# Patient Record
Sex: Female | Born: 1945 | Race: White | Hispanic: No | Smoking: Never smoker
Health system: Southern US, Community
[De-identification: ages and names within clinical notes are randomized; demographics above are authoritative.]

## PROBLEM LIST (undated history)

## (undated) DIAGNOSIS — Z87448 Personal history of other diseases of urinary system: Secondary | ICD-10-CM

## (undated) DIAGNOSIS — N63 Unspecified lump in unspecified breast: Secondary | ICD-10-CM

## (undated) DIAGNOSIS — F419 Anxiety disorder, unspecified: Secondary | ICD-10-CM

## (undated) DIAGNOSIS — M1990 Unspecified osteoarthritis, unspecified site: Secondary | ICD-10-CM

## (undated) DIAGNOSIS — R7303 Prediabetes: Secondary | ICD-10-CM

## (undated) DIAGNOSIS — J4530 Mild persistent asthma, uncomplicated: Secondary | ICD-10-CM

## (undated) DIAGNOSIS — F329 Major depressive disorder, single episode, unspecified: Secondary | ICD-10-CM

## (undated) DIAGNOSIS — R112 Nausea with vomiting, unspecified: Secondary | ICD-10-CM

## (undated) DIAGNOSIS — Z9889 Other specified postprocedural states: Secondary | ICD-10-CM

## (undated) DIAGNOSIS — H1045 Other chronic allergic conjunctivitis: Secondary | ICD-10-CM

## (undated) DIAGNOSIS — N3281 Overactive bladder: Secondary | ICD-10-CM

## (undated) DIAGNOSIS — K8020 Calculus of gallbladder without cholecystitis without obstruction: Secondary | ICD-10-CM

## (undated) DIAGNOSIS — J45909 Unspecified asthma, uncomplicated: Secondary | ICD-10-CM

## (undated) DIAGNOSIS — H8110 Benign paroxysmal vertigo, unspecified ear: Secondary | ICD-10-CM

## (undated) DIAGNOSIS — K829 Disease of gallbladder, unspecified: Secondary | ICD-10-CM

## (undated) DIAGNOSIS — Z973 Presence of spectacles and contact lenses: Secondary | ICD-10-CM

## (undated) DIAGNOSIS — D649 Anemia, unspecified: Secondary | ICD-10-CM

## (undated) DIAGNOSIS — M797 Fibromyalgia: Secondary | ICD-10-CM

## (undated) DIAGNOSIS — E538 Deficiency of other specified B group vitamins: Secondary | ICD-10-CM

## (undated) DIAGNOSIS — K5790 Diverticulosis of intestine, part unspecified, without perforation or abscess without bleeding: Secondary | ICD-10-CM

## (undated) DIAGNOSIS — E559 Vitamin D deficiency, unspecified: Secondary | ICD-10-CM

## (undated) DIAGNOSIS — M549 Dorsalgia, unspecified: Secondary | ICD-10-CM

## (undated) DIAGNOSIS — R1310 Dysphagia, unspecified: Secondary | ICD-10-CM

## (undated) DIAGNOSIS — K219 Gastro-esophageal reflux disease without esophagitis: Secondary | ICD-10-CM

## (undated) DIAGNOSIS — M2550 Pain in unspecified joint: Secondary | ICD-10-CM

## (undated) DIAGNOSIS — K862 Cyst of pancreas: Secondary | ICD-10-CM

## (undated) DIAGNOSIS — N393 Stress incontinence (female) (male): Secondary | ICD-10-CM

## (undated) DIAGNOSIS — K449 Diaphragmatic hernia without obstruction or gangrene: Secondary | ICD-10-CM

## (undated) DIAGNOSIS — G2581 Restless legs syndrome: Secondary | ICD-10-CM

## (undated) DIAGNOSIS — J3089 Other allergic rhinitis: Secondary | ICD-10-CM

## (undated) DIAGNOSIS — D509 Iron deficiency anemia, unspecified: Secondary | ICD-10-CM

## (undated) DIAGNOSIS — J302 Other seasonal allergic rhinitis: Secondary | ICD-10-CM

## (undated) DIAGNOSIS — G4733 Obstructive sleep apnea (adult) (pediatric): Secondary | ICD-10-CM

## (undated) DIAGNOSIS — R197 Diarrhea, unspecified: Secondary | ICD-10-CM

## (undated) DIAGNOSIS — F32A Depression, unspecified: Secondary | ICD-10-CM

## (undated) HISTORY — DX: Unspecified lump in unspecified breast: N63.0

## (undated) HISTORY — DX: Vitamin D deficiency, unspecified: E55.9

## (undated) HISTORY — DX: Deficiency of other specified B group vitamins: E53.8

## (undated) HISTORY — DX: Cyst of pancreas: K86.2

## (undated) HISTORY — DX: Obstructive sleep apnea (adult) (pediatric): G47.33

## (undated) HISTORY — PX: CORNEA LESION EXCISION: SHX356

## (undated) HISTORY — PX: BREAST REDUCTION SURGERY: SHX8

## (undated) HISTORY — DX: Anxiety disorder, unspecified: F41.9

## (undated) HISTORY — DX: Dysphagia, unspecified: R13.10

## (undated) HISTORY — DX: Fibromyalgia: M79.7

## (undated) HISTORY — DX: Pain in unspecified joint: M25.50

## (undated) HISTORY — DX: Diverticulosis of intestine, part unspecified, without perforation or abscess without bleeding: K57.90

## (undated) HISTORY — DX: Major depressive disorder, single episode, unspecified: F32.9

## (undated) HISTORY — DX: Restless legs syndrome: G25.81

## (undated) HISTORY — DX: Calculus of gallbladder without cholecystitis without obstruction: K80.20

## (undated) HISTORY — DX: Gastro-esophageal reflux disease without esophagitis: K21.9

## (undated) HISTORY — DX: Dorsalgia, unspecified: M54.9

## (undated) HISTORY — DX: Unspecified asthma, uncomplicated: J45.909

## (undated) HISTORY — PX: BLADDER SUSPENSION: SHX72

## (undated) HISTORY — DX: Disease of gallbladder, unspecified: K82.9

## (undated) HISTORY — DX: Depression, unspecified: F32.A

---

## 1982-05-31 HISTORY — PX: VAGINAL HYSTERECTOMY: SHX2639

## 1983-06-01 HISTORY — PX: BREAST LUMPECTOMY: SHX2

## 1999-05-19 ENCOUNTER — Other Ambulatory Visit: Admission: RE | Admit: 1999-05-19 | Discharge: 1999-05-19 | Payer: Self-pay | Admitting: *Deleted

## 2000-03-18 ENCOUNTER — Encounter: Payer: Self-pay | Admitting: Orthopedic Surgery

## 2000-03-18 ENCOUNTER — Encounter: Admission: RE | Admit: 2000-03-18 | Discharge: 2000-03-18 | Payer: Self-pay | Admitting: Orthopedic Surgery

## 2000-05-16 ENCOUNTER — Other Ambulatory Visit: Admission: RE | Admit: 2000-05-16 | Discharge: 2000-05-16 | Payer: Self-pay | Admitting: *Deleted

## 2000-11-28 ENCOUNTER — Other Ambulatory Visit: Admission: RE | Admit: 2000-11-28 | Discharge: 2000-11-28 | Payer: Self-pay | Admitting: Plastic Surgery

## 2000-11-28 ENCOUNTER — Encounter (INDEPENDENT_AMBULATORY_CARE_PROVIDER_SITE_OTHER): Payer: Self-pay

## 2001-05-17 ENCOUNTER — Other Ambulatory Visit: Admission: RE | Admit: 2001-05-17 | Discharge: 2001-05-17 | Payer: Self-pay | Admitting: *Deleted

## 2002-05-21 ENCOUNTER — Other Ambulatory Visit: Admission: RE | Admit: 2002-05-21 | Discharge: 2002-05-21 | Payer: Self-pay | Admitting: *Deleted

## 2003-05-22 ENCOUNTER — Other Ambulatory Visit: Admission: RE | Admit: 2003-05-22 | Discharge: 2003-05-22 | Payer: Self-pay | Admitting: *Deleted

## 2004-05-06 ENCOUNTER — Other Ambulatory Visit: Admission: RE | Admit: 2004-05-06 | Discharge: 2004-05-06 | Payer: Self-pay | Admitting: *Deleted

## 2004-05-28 ENCOUNTER — Ambulatory Visit: Payer: Self-pay | Admitting: Internal Medicine

## 2004-09-25 ENCOUNTER — Encounter: Admission: RE | Admit: 2004-09-25 | Discharge: 2004-09-25 | Payer: Self-pay | Admitting: Rheumatology

## 2005-02-16 ENCOUNTER — Ambulatory Visit: Payer: Self-pay | Admitting: Internal Medicine

## 2005-03-11 ENCOUNTER — Ambulatory Visit: Payer: Self-pay | Admitting: Internal Medicine

## 2005-03-25 ENCOUNTER — Ambulatory Visit: Payer: Self-pay | Admitting: Internal Medicine

## 2005-04-30 ENCOUNTER — Ambulatory Visit: Payer: Self-pay | Admitting: Internal Medicine

## 2005-07-21 ENCOUNTER — Other Ambulatory Visit: Admission: RE | Admit: 2005-07-21 | Discharge: 2005-07-21 | Payer: Self-pay | Admitting: *Deleted

## 2005-08-12 ENCOUNTER — Ambulatory Visit: Payer: Self-pay | Admitting: Internal Medicine

## 2006-01-03 ENCOUNTER — Ambulatory Visit: Payer: Self-pay | Admitting: Internal Medicine

## 2006-01-24 ENCOUNTER — Ambulatory Visit: Payer: Self-pay | Admitting: Internal Medicine

## 2006-05-25 ENCOUNTER — Encounter: Admission: RE | Admit: 2006-05-25 | Discharge: 2006-05-25 | Payer: Self-pay | Admitting: Rheumatology

## 2006-09-27 ENCOUNTER — Other Ambulatory Visit: Admission: RE | Admit: 2006-09-27 | Discharge: 2006-09-27 | Payer: Self-pay | Admitting: *Deleted

## 2006-12-08 ENCOUNTER — Encounter (INDEPENDENT_AMBULATORY_CARE_PROVIDER_SITE_OTHER): Payer: Self-pay | Admitting: *Deleted

## 2007-01-06 ENCOUNTER — Ambulatory Visit: Payer: Self-pay | Admitting: Internal Medicine

## 2007-01-06 DIAGNOSIS — E8881 Metabolic syndrome: Secondary | ICD-10-CM | POA: Insufficient documentation

## 2007-01-06 DIAGNOSIS — K589 Irritable bowel syndrome without diarrhea: Secondary | ICD-10-CM | POA: Insufficient documentation

## 2007-01-06 DIAGNOSIS — IMO0001 Reserved for inherently not codable concepts without codable children: Secondary | ICD-10-CM | POA: Insufficient documentation

## 2007-01-06 DIAGNOSIS — J309 Allergic rhinitis, unspecified: Secondary | ICD-10-CM | POA: Insufficient documentation

## 2007-01-06 DIAGNOSIS — M25559 Pain in unspecified hip: Secondary | ICD-10-CM | POA: Insufficient documentation

## 2007-01-06 DIAGNOSIS — F411 Generalized anxiety disorder: Secondary | ICD-10-CM | POA: Insufficient documentation

## 2007-01-06 DIAGNOSIS — I10 Essential (primary) hypertension: Secondary | ICD-10-CM | POA: Insufficient documentation

## 2007-01-16 ENCOUNTER — Encounter (INDEPENDENT_AMBULATORY_CARE_PROVIDER_SITE_OTHER): Payer: Self-pay | Admitting: *Deleted

## 2007-02-27 ENCOUNTER — Encounter: Payer: Self-pay | Admitting: Internal Medicine

## 2007-04-17 ENCOUNTER — Encounter: Payer: Self-pay | Admitting: Internal Medicine

## 2007-04-19 ENCOUNTER — Encounter: Payer: Self-pay | Admitting: Internal Medicine

## 2007-09-28 ENCOUNTER — Encounter: Payer: Self-pay | Admitting: Internal Medicine

## 2007-10-11 ENCOUNTER — Encounter: Admission: RE | Admit: 2007-10-11 | Discharge: 2007-10-11 | Payer: Self-pay | Admitting: Rheumatology

## 2007-10-25 ENCOUNTER — Other Ambulatory Visit: Admission: RE | Admit: 2007-10-25 | Discharge: 2007-10-25 | Payer: Self-pay | Admitting: Gynecology

## 2007-11-03 ENCOUNTER — Encounter: Payer: Self-pay | Admitting: Internal Medicine

## 2008-01-16 ENCOUNTER — Ambulatory Visit: Payer: Self-pay | Admitting: Internal Medicine

## 2008-01-16 DIAGNOSIS — K5730 Diverticulosis of large intestine without perforation or abscess without bleeding: Secondary | ICD-10-CM | POA: Insufficient documentation

## 2008-01-16 DIAGNOSIS — R1310 Dysphagia, unspecified: Secondary | ICD-10-CM | POA: Insufficient documentation

## 2008-01-19 ENCOUNTER — Encounter: Payer: Self-pay | Admitting: Internal Medicine

## 2008-01-22 ENCOUNTER — Encounter (INDEPENDENT_AMBULATORY_CARE_PROVIDER_SITE_OTHER): Payer: Self-pay | Admitting: *Deleted

## 2008-01-22 LAB — CONVERTED CEMR LAB
ALT: 10 units/L (ref 0–35)
Albumin: 3.8 g/dL (ref 3.5–5.2)
BUN: 12 mg/dL (ref 6–23)
Basophils Relative: 0.7 % (ref 0.0–3.0)
Eosinophils Relative: 2.2 % (ref 0.0–5.0)
HCT: 37.5 % (ref 36.0–46.0)
Hemoglobin: 12.5 g/dL (ref 12.0–15.0)
Monocytes Absolute: 0.2 10*3/uL (ref 0.1–1.0)
Monocytes Relative: 3.8 % (ref 3.0–12.0)
Neutro Abs: 4.2 10*3/uL (ref 1.4–7.7)
Potassium: 3.9 meq/L (ref 3.5–5.1)
RBC: 4.26 M/uL (ref 3.87–5.11)
RDW: 13.4 % (ref 11.5–14.6)
Total Bilirubin: 0.5 mg/dL (ref 0.3–1.2)
Total Protein: 7 g/dL (ref 6.0–8.3)

## 2008-01-24 ENCOUNTER — Encounter (INDEPENDENT_AMBULATORY_CARE_PROVIDER_SITE_OTHER): Payer: Self-pay | Admitting: *Deleted

## 2008-03-01 ENCOUNTER — Telehealth: Payer: Self-pay | Admitting: Internal Medicine

## 2008-03-06 ENCOUNTER — Encounter: Payer: Self-pay | Admitting: Internal Medicine

## 2008-03-13 ENCOUNTER — Ambulatory Visit: Payer: Self-pay | Admitting: Pulmonary Disease

## 2008-03-26 ENCOUNTER — Encounter: Payer: Self-pay | Admitting: Internal Medicine

## 2008-03-27 ENCOUNTER — Ambulatory Visit: Payer: Self-pay | Admitting: Internal Medicine

## 2008-03-28 ENCOUNTER — Encounter: Payer: Self-pay | Admitting: Internal Medicine

## 2008-03-28 ENCOUNTER — Ambulatory Visit (HOSPITAL_BASED_OUTPATIENT_CLINIC_OR_DEPARTMENT_OTHER): Admission: RE | Admit: 2008-03-28 | Discharge: 2008-03-28 | Payer: Self-pay | Admitting: Pulmonary Disease

## 2008-04-04 ENCOUNTER — Ambulatory Visit: Payer: Self-pay | Admitting: Pulmonary Disease

## 2008-04-23 ENCOUNTER — Ambulatory Visit: Payer: Self-pay | Admitting: Pulmonary Disease

## 2008-04-23 DIAGNOSIS — G4733 Obstructive sleep apnea (adult) (pediatric): Secondary | ICD-10-CM | POA: Insufficient documentation

## 2008-04-23 DIAGNOSIS — G2581 Restless legs syndrome: Secondary | ICD-10-CM | POA: Insufficient documentation

## 2008-04-29 ENCOUNTER — Encounter: Payer: Self-pay | Admitting: Pulmonary Disease

## 2008-05-02 ENCOUNTER — Encounter: Payer: Self-pay | Admitting: Pulmonary Disease

## 2008-05-03 ENCOUNTER — Telehealth (INDEPENDENT_AMBULATORY_CARE_PROVIDER_SITE_OTHER): Payer: Self-pay | Admitting: *Deleted

## 2008-05-03 ENCOUNTER — Telehealth: Payer: Self-pay | Admitting: Internal Medicine

## 2008-05-03 LAB — CONVERTED CEMR LAB
BUN: 9 mg/dL (ref 6–23)
Calcium: 9.3 mg/dL (ref 8.4–10.5)
Creatinine, Ser: 0.8 mg/dL (ref 0.4–1.2)
Eosinophils Absolute: 0.1 10*3/uL (ref 0.0–0.7)
Ferritin: 19.1 ng/mL (ref 10.0–291.0)
Folate: 9.7 ng/mL
GFR calc non Af Amer: 78 mL/min
HCT: 38.6 % (ref 36.0–46.0)
Hemoglobin: 13.1 g/dL (ref 12.0–15.0)
Iron: 28 ug/dL — ABNORMAL LOW (ref 42–145)
MCHC: 33.8 g/dL (ref 30.0–36.0)
MCV: 84.4 fL (ref 78.0–100.0)
Monocytes Absolute: 0.6 10*3/uL (ref 0.1–1.0)
Neutro Abs: 4.5 10*3/uL (ref 1.4–7.7)
Platelets: 269 10*3/uL (ref 150–400)
RDW: 13.1 % (ref 11.5–14.6)
TSH: 1.19 microintl units/mL (ref 0.35–5.50)
Vitamin B-12: 169 pg/mL — ABNORMAL LOW (ref 211–911)

## 2008-05-07 ENCOUNTER — Ambulatory Visit: Payer: Self-pay | Admitting: Internal Medicine

## 2008-05-07 DIAGNOSIS — K219 Gastro-esophageal reflux disease without esophagitis: Secondary | ICD-10-CM | POA: Insufficient documentation

## 2008-05-09 ENCOUNTER — Encounter (INDEPENDENT_AMBULATORY_CARE_PROVIDER_SITE_OTHER): Payer: Self-pay | Admitting: *Deleted

## 2008-05-31 HISTORY — PX: OTHER SURGICAL HISTORY: SHX169

## 2008-06-05 ENCOUNTER — Telehealth (INDEPENDENT_AMBULATORY_CARE_PROVIDER_SITE_OTHER): Payer: Self-pay | Admitting: *Deleted

## 2008-06-12 ENCOUNTER — Ambulatory Visit: Payer: Self-pay | Admitting: Internal Medicine

## 2008-06-12 ENCOUNTER — Telehealth (INDEPENDENT_AMBULATORY_CARE_PROVIDER_SITE_OTHER): Payer: Self-pay | Admitting: *Deleted

## 2008-06-13 ENCOUNTER — Telehealth (INDEPENDENT_AMBULATORY_CARE_PROVIDER_SITE_OTHER): Payer: Self-pay | Admitting: *Deleted

## 2008-07-11 ENCOUNTER — Encounter: Payer: Self-pay | Admitting: Internal Medicine

## 2008-07-17 ENCOUNTER — Ambulatory Visit: Payer: Self-pay | Admitting: Internal Medicine

## 2008-07-24 ENCOUNTER — Ambulatory Visit: Payer: Self-pay | Admitting: Pulmonary Disease

## 2008-08-14 ENCOUNTER — Ambulatory Visit: Payer: Self-pay | Admitting: Internal Medicine

## 2008-08-15 ENCOUNTER — Encounter: Payer: Self-pay | Admitting: Pulmonary Disease

## 2008-08-15 ENCOUNTER — Ambulatory Visit (HOSPITAL_BASED_OUTPATIENT_CLINIC_OR_DEPARTMENT_OTHER): Admission: RE | Admit: 2008-08-15 | Discharge: 2008-08-15 | Payer: Self-pay | Admitting: Pulmonary Disease

## 2008-08-21 ENCOUNTER — Encounter: Payer: Self-pay | Admitting: Internal Medicine

## 2008-08-21 ENCOUNTER — Ambulatory Visit: Payer: Self-pay | Admitting: Pulmonary Disease

## 2008-09-04 ENCOUNTER — Ambulatory Visit: Payer: Self-pay | Admitting: Pulmonary Disease

## 2008-09-16 ENCOUNTER — Ambulatory Visit: Payer: Self-pay | Admitting: Internal Medicine

## 2008-10-16 ENCOUNTER — Encounter: Payer: Self-pay | Admitting: Internal Medicine

## 2008-10-22 ENCOUNTER — Ambulatory Visit: Payer: Self-pay | Admitting: Internal Medicine

## 2008-12-10 ENCOUNTER — Ambulatory Visit: Payer: Self-pay | Admitting: Internal Medicine

## 2008-12-24 ENCOUNTER — Encounter: Payer: Self-pay | Admitting: Pulmonary Disease

## 2008-12-24 ENCOUNTER — Encounter: Payer: Self-pay | Admitting: Internal Medicine

## 2008-12-30 ENCOUNTER — Ambulatory Visit: Payer: Self-pay | Admitting: Pulmonary Disease

## 2009-01-02 ENCOUNTER — Encounter: Payer: Self-pay | Admitting: Pulmonary Disease

## 2009-01-13 ENCOUNTER — Telehealth (INDEPENDENT_AMBULATORY_CARE_PROVIDER_SITE_OTHER): Payer: Self-pay | Admitting: *Deleted

## 2009-01-27 ENCOUNTER — Encounter: Payer: Self-pay | Admitting: Internal Medicine

## 2009-01-29 ENCOUNTER — Ambulatory Visit: Payer: Self-pay | Admitting: Pulmonary Disease

## 2009-01-30 ENCOUNTER — Encounter: Payer: Self-pay | Admitting: Pulmonary Disease

## 2009-02-06 ENCOUNTER — Encounter: Payer: Self-pay | Admitting: Pulmonary Disease

## 2009-02-06 ENCOUNTER — Encounter: Payer: Self-pay | Admitting: Internal Medicine

## 2009-02-10 ENCOUNTER — Ambulatory Visit: Payer: Self-pay | Admitting: Pulmonary Disease

## 2009-02-17 ENCOUNTER — Telehealth: Payer: Self-pay | Admitting: Pulmonary Disease

## 2009-02-26 ENCOUNTER — Encounter: Payer: Self-pay | Admitting: Internal Medicine

## 2009-03-06 ENCOUNTER — Encounter: Payer: Self-pay | Admitting: Internal Medicine

## 2009-03-27 ENCOUNTER — Encounter: Payer: Self-pay | Admitting: Internal Medicine

## 2009-04-01 ENCOUNTER — Encounter: Payer: Self-pay | Admitting: Pulmonary Disease

## 2009-04-03 ENCOUNTER — Ambulatory Visit: Payer: Self-pay | Admitting: Internal Medicine

## 2009-04-04 ENCOUNTER — Encounter (INDEPENDENT_AMBULATORY_CARE_PROVIDER_SITE_OTHER): Payer: Self-pay | Admitting: *Deleted

## 2009-04-09 ENCOUNTER — Encounter: Payer: Self-pay | Admitting: Pulmonary Disease

## 2009-04-14 ENCOUNTER — Ambulatory Visit (HOSPITAL_COMMUNITY): Admission: RE | Admit: 2009-04-14 | Discharge: 2009-04-15 | Payer: Self-pay | Admitting: Obstetrics and Gynecology

## 2009-05-19 ENCOUNTER — Telehealth (INDEPENDENT_AMBULATORY_CARE_PROVIDER_SITE_OTHER): Payer: Self-pay | Admitting: *Deleted

## 2009-06-25 ENCOUNTER — Ambulatory Visit: Payer: Self-pay | Admitting: Pulmonary Disease

## 2009-06-25 DIAGNOSIS — J45909 Unspecified asthma, uncomplicated: Secondary | ICD-10-CM | POA: Insufficient documentation

## 2009-06-26 ENCOUNTER — Encounter: Payer: Self-pay | Admitting: Pulmonary Disease

## 2009-06-29 ENCOUNTER — Encounter: Payer: Self-pay | Admitting: Pulmonary Disease

## 2009-07-23 ENCOUNTER — Encounter: Payer: Self-pay | Admitting: Internal Medicine

## 2009-07-30 ENCOUNTER — Ambulatory Visit: Payer: Self-pay | Admitting: Internal Medicine

## 2009-08-06 ENCOUNTER — Telehealth (INDEPENDENT_AMBULATORY_CARE_PROVIDER_SITE_OTHER): Payer: Self-pay | Admitting: *Deleted

## 2009-10-06 ENCOUNTER — Telehealth (INDEPENDENT_AMBULATORY_CARE_PROVIDER_SITE_OTHER): Payer: Self-pay | Admitting: *Deleted

## 2009-11-05 ENCOUNTER — Ambulatory Visit: Payer: Self-pay | Admitting: Pulmonary Disease

## 2009-12-02 ENCOUNTER — Telehealth (INDEPENDENT_AMBULATORY_CARE_PROVIDER_SITE_OTHER): Payer: Self-pay | Admitting: *Deleted

## 2010-02-19 ENCOUNTER — Ambulatory Visit: Payer: Self-pay | Admitting: Pulmonary Disease

## 2010-02-24 ENCOUNTER — Encounter: Payer: Self-pay | Admitting: Pulmonary Disease

## 2010-06-21 ENCOUNTER — Encounter: Payer: Self-pay | Admitting: Podiatry

## 2010-06-28 LAB — CONVERTED CEMR LAB
ALT: 9 units/L (ref 0–35)
AST: 20 units/L (ref 0–37)
AST: 20 units/L (ref 0–37)
Albumin: 3.9 g/dL (ref 3.5–5.2)
Albumin: 4.1 g/dL (ref 3.5–5.2)
Alkaline Phosphatase: 58 units/L (ref 39–117)
Alkaline Phosphatase: 87 units/L (ref 39–117)
Anti Nuclear Antibody(ANA): NEGATIVE
BUN: 11 mg/dL (ref 6–23)
Basophils Absolute: 0 10*3/uL (ref 0.0–0.1)
Basophils Absolute: 0 10*3/uL (ref 0.0–0.1)
Basophils Relative: 0.7 % (ref 0.0–1.0)
Bilirubin, Direct: 0.1 mg/dL (ref 0.0–0.3)
Bilirubin, Direct: 0.1 mg/dL (ref 0.0–0.3)
CO2: 26 meq/L (ref 19–32)
CO2: 32 meq/L (ref 19–32)
Calcium: 9 mg/dL (ref 8.4–10.5)
Calcium: 9.5 mg/dL (ref 8.4–10.5)
Chloride: 100 meq/L (ref 96–112)
Creatinine, Ser: 0.7 mg/dL (ref 0.4–1.2)
Creatinine, Ser: 0.7 mg/dL (ref 0.4–1.2)
Eosinophils Absolute: 0.1 10*3/uL (ref 0.0–0.6)
Eosinophils Absolute: 0.1 10*3/uL (ref 0.0–0.7)
Eosinophils Relative: 1.4 % (ref 0.0–5.0)
Folate: 12.1 ng/mL
GFR calc Af Amer: 110 mL/min
GFR calc non Af Amer: 90.13 mL/min (ref >60)
GFR calc non Af Amer: 91 mL/min
Glucose, Bld: 84 mg/dL (ref 70–99)
Glucose, Bld: 89 mg/dL (ref 70–99)
HCT: 36.3 % (ref 36.0–46.0)
HDL: 74.1 mg/dL (ref >39.00)
Hemoglobin: 12.5 g/dL (ref 12.0–15.0)
Hemoglobin: 13.6 g/dL (ref 12.0–15.0)
Hgb A1c MFr Bld: 4.9 % (ref 4.6–6.0)
Iron: 59 ug/dL (ref 42–145)
Lymphocytes Relative: 23.9 % (ref 12.0–46.0)
Lymphocytes Relative: 25.3 % (ref 12.0–46.0)
MCHC: 34.1 g/dL (ref 30.0–36.0)
MCHC: 34.4 g/dL (ref 30.0–36.0)
MCV: 87.8 fL (ref 78.0–100.0)
Monocytes Absolute: 0.4 10*3/uL (ref 0.2–0.7)
Monocytes Relative: 7.1 % (ref 3.0–12.0)
Monocytes Relative: 7.9 % (ref 3.0–11.0)
Neutro Abs: 3.5 10*3/uL (ref 1.4–7.7)
Neutro Abs: 4.6 10*3/uL (ref 1.4–7.7)
Neutrophils Relative %: 65.3 % (ref 43.0–77.0)
Neutrophils Relative %: 66.1 % (ref 43.0–77.0)
Platelets: 263 10*3/uL (ref 150.0–400.0)
Platelets: 265 10*3/uL (ref 150–400)
Potassium: 3.5 meq/L (ref 3.5–5.1)
RBC: 4.13 M/uL (ref 3.87–5.11)
RDW: 12.5 % (ref 11.5–14.6)
RDW: 13.7 % (ref 11.5–14.6)
Rheumatoid fact SerPl-aCnc: <20 intl units/mL — ABNORMAL LOW (ref 0.0–20.0)
Saturation Ratios: 12.7 % — ABNORMAL LOW (ref 20.0–50.0)
Sed Rate: 13 mm/hr (ref 0–25)
Sodium: 135 meq/L (ref 135–145)
Sodium: 141 meq/L (ref 135–145)
Total Bilirubin: 0.5 mg/dL (ref 0.3–1.2)
Total Bilirubin: 0.9 mg/dL (ref 0.3–1.2)
Total CHOL/HDL Ratio: 2
Total CK: 63 units/L (ref 7–177)
Total Protein: 7.2 g/dL (ref 6.0–8.3)
Transferrin: 333.1 mg/dL (ref 212.0–360.0)
Triglycerides: 80 mg/dL (ref 0.0–149.0)
Uric Acid, Serum: 5.5 mg/dL (ref 2.4–7.0)
VLDL: 16 mg/dL (ref 0.0–40.0)
WBC: 5.2 10*3/uL (ref 4.5–10.5)

## 2010-07-02 NOTE — Progress Notes (Signed)
Summary: LETTER  Phone Note Call from Patient Call back at [phone]   Caller: Patient Call For: SOOD Summary of Call: PT NEED PRE DETERMINE LETTER STATING SHE HAS SLEEP APNEA  *pt wants to pick up letter if dictated Initial call taken by: Rickard Patience,  December 02, 2009 3:28 PM  Follow-up for Phone Call        Spoke with pt.  She states that she needs a letter for [facility] stating that she needs has OSA and has tried cpap without success so needs to try a dental appliance.  Otherwise, insurance will not even consider covering this for pt. Please advise, thanks Follow-up by: Vernie Murders,  December 02, 2009 3:35 PM  Additional Follow-up for Phone Call Additional follow up Details #1::        Is there a form to be completed, or do I need to dictate a letter? Additional Follow-up by: Coralyn Helling MD,  December 03, 2009 9:01 AM     Appended Document: LETTER per pt she needs a dictated letter on letterhead  Appended Document: LETTER Letter dictated.  Appended Document: LETTER triage aware, will forward to [facility] since the dictated letter will come to her.  Appended Document: LETTER The patient has been notified that her letter is ready and she now request that we mail this to her home address, which I verified with the patient. Letter will be mailed today, 12/08/2009.

## 2010-07-02 NOTE — Progress Notes (Signed)
Summary: restless legs  Phone Note Call from Patient   Caller: Patient Call For: sood Summary of Call: pt requests to increase her rx for requip (wants to go up from .5mg . [facility] [address]. pt [phone] Initial call taken by: Tivis Ringer, CNA,  August 06, 2009 12:55 PM  Follow-up for Phone Call        Called and spoke with pt.  She is currenly being prescribed requip 0.5 mg per VS.  Pt states that VS advised her that she could increase her dose by 0.5 mg incriments if needed up to 2 mg to help with leg symptoms.  Pt is requesting a new rx for this as she was only getting 30 tablets and she is needed to use 2-4 tablets before bedtime, but she states that even this much does not seem to help.  She states that she still has to get up some nights and walk the halls to get relief.  She also states that her symptoms are starting earlier in the day then they used to.  Will forward to doc of the day for recs.  Please advise, thanks Vernie Murders  August 06, 2009 2:13 PM     Additional Follow-up for Phone Call Additional follow up Details #1::        have her try 0.5mg  late afternoon, then take 1mg  at bedtime.  can call in a script for #90 of 0.5mg , no fills. Additional Follow-up by: Barbaraann Share MD,  August 06, 2009 6:16 PM    Additional Follow-up for Phone Call Additional follow up Details #2::    The patient is aware of KC's recs and a new RX has been sent to the pharmacy for her. She will call if she has any further problems. she will schedule a f/u with [facility] in May 2011. I will forward this msg so Dr. Craige Cotta is aware of the medication change. Follow-up by: Michel Bickers CMA,  August 07, 2009 9:11 AM  New/Updated Medications: REQUIP 0.5 MG TABS (ROPINIROLE HCL) 1 by mouth late afternoon and 2 by mouth at bedtime Prescriptions: REQUIP 0.5 MG TABS (ROPINIROLE HCL) 1 by mouth late afternoon and 2 by mouth at bedtime  #90 x 0   Entered by:   Michel Bickers CMA   Authorized by:   Barbaraann Share  MD   Signed by:   Michel Bickers CMA on 08/07/2009   Method used:   Electronically to        [facility]  [phone]* (retail)       [address]       [place], [place]  [address]       Ph: [phone] or [phone]       Fax: [phone]   RxID:   [id number]

## 2010-07-02 NOTE — Assessment & Plan Note (Signed)
Summary: follow up/klw   Copy to:  Sidney Ace Primary Provider/Referring Provider:  Marga Melnick  CC:  OSA and RLS. The patient states she is not sleeping well due to her mask leaking air. She also states her restless leg symptoms are worse  and occuring earlier in the evenings..  History of Present Illness: Ms. Bratz is a 65 yo female with history of dyspnea, asthma, mild OSA on CPAP 6,  and RLS  She has been having more trouble with her legs.  She increased her dose of requip from 0.25 mg to 0.5 mg at bedtime, and this helped some.  She has been on citalopram for some time.  She continues to have trouble with her CPAP mask.  She got a new mask, but this was no better.  She continues to have dyspnea with exertion.  She gets some wheeze and congestion, but does not cough much.  She ran out of singulair.  She has been using her proair some.    Hypertension History:      Positive major cardiovascular risk factors include female age 66 years old or older and hypertension.  Negative major cardiovascular risk factors include non-tobacco-user status.     Current Medications (verified): 1)  Symbicort 160-4.5 Mcg/act Aero (Budesonide-Formoterol Fumarate) .... 2 Puffs Two Times A Day 2)  Proair Hfa 108 (90 Base) Mcg/act Aers (Albuterol Sulfate) .... As Directed 3)  Flonase 50 Mcg/act Susp (Fluticasone Propionate) .... As Needed 4)  Zyrtec Allergy 10 Mg  Tabs (Cetirizine Hcl) .... Use As Needed 5)  Requip 0.5 Mg Tabs (Ropinirole Hcl) .... One By Mouth 1 Hour Before Leg Symptoms Start 6)  Estrace 1 Mg Tabs (Estradiol) .... Take 1 Tablet By Mouth Once A Day 7)  Vitamin D 2000 Unit Tabs (Cholecalciferol) .Marland Kitchen.. 1 By Mouth Once Daily 8)  Prevacid 30 Mg Cpdr (Lansoprazole) .... Take 1 Tablet By Mouth Once A Day 9)  Restoril 15 Mg Caps (Temazepam) .... One By Mouth At Bedtime As Needed 10)  Wellbutrin Xl 300 Mg Xr24h-Tab (Bupropion Hcl) .Marland Kitchen.. 1 By Mouth Once Daily 11)  Citalopram Hydrobromide 20  Mg Tabs (Citalopram Hydrobromide) .Marland Kitchen.. 1 By Mouth Once Daily 12)  Spironolactone 25 Mg Tabs (Spironolactone) .Marland Kitchen.. 1 By Mouth Once Daily  Allergies (verified): 1)  * Aspirin-Like Analgesic, Salicylates Group 2)  Reglan (Metoclopramide Hcl) 3)  Hydrochlorothiazide (Hydrochlorothiazide) 4)  Zantac 5)  Aleve (Naproxen Sodium) 6)  * Sulfa (Sulfonamides) Group 7)  Keflex (Cephalexin)  Past History:  Past Medical History: Reviewed history from 04/03/2009 and no changes required. pyuria age 57;  Fibromyalgia; + lupus serology, Dr. Janalyn Rouse B Deveshwar  Diverticulosis, colon GERD Asthma, PFT 01/29/09 FEV1 2.50(103%), FVC 2.88(88%), FEV1% 87, TLC 4.37(83%), DLCO 85%, no BD response Rhinitis, Dr. Sidney Ace Restless Leg Syndrome OSA, RDI 8 03/18/08, CPAP 10 cm H2O  Past Surgical History: Reviewed history from 04/03/2009 and no changes required. Colonoscopy 2006 : diverticulosis Hysterectomy for prolapse Lumpectomy, benign lesion; breast reduction Bladder tacking  G2 P2  Social History: Married.  Husband being treated for stomach cancer. Occupation:Merchandising Specilaist Never Smoked Alcohol use-no Regular exercise-yes  Vital Signs:  Patient profile:   65 year old female Height:      66.5 inches (168.91 cm) Weight:      216 pounds (98.18 kg) BMI:     34.47 O2 Sat:      97 % on Room air Temp:     98.4 degrees F (36.89 degrees C) oral Pulse rate:  76 / minute BP sitting:   112 / 70  (left arm) Cuff size:   regular  Vitals Entered By: Michel Bickers CMA (June 25, 2009 4:36 PM)  O2 Sat at Rest %:  97 O2 Flow:  Room air  Physical Exam  General:  normal appearance and obese.   Nose:  no deformity, discharge, inflammation, or lesions, narrow nasal angles Mouth:  no deformity or lesions Neck:  no JVD.   Lungs:  clear bilaterally to auscultation and percussion Heart:  regular rate and rhythm, S1, S2 without murmurs, rubs, gallops, or clicks Abdomen:  obese,  soft Extremities:  no clubbing, cyanosis, edema, or deformity noted Cervical Nodes:  no significant adenopathy   Impression & Recommendations:  Problem # 1:  OBSTRUCTIVE SLEEP APNEA (ICD-327.23) Will set her up to get mask refit at the sleep lab.  Problem # 2:  RESTLESS LEG SYNDROME (ICD-333.94) Advised her that she is on a rather low dose of requip.  Advised her to increase dose in 0.5 mg increments upto 2 mg at bedtime as tolerated.  Problem # 3:  ASTHMA (ICD-493.90) Will refil her singulair.  She is to follow up with Dr. [place] Callas for further treatment of this.  Medications Added to Medication List This Visit: 1)  Symbicort 160-4.5 Mcg/act Aero (Budesonide-formoterol fumarate) .... 2 puffs two times a day 2)  Singulair 10 Mg Tabs (Montelukast sodium) .... One by mouth at bedtime  Complete Medication List: 1)  Symbicort 160-4.5 Mcg/act Aero (Budesonide-formoterol fumarate) .... 2 puffs two times a day 2)  Proair Hfa 108 (90 Base) Mcg/act Aers (Albuterol sulfate) .... As directed 3)  Flonase 50 Mcg/act Susp (Fluticasone propionate) .... As needed 4)  Zyrtec Allergy 10 Mg Tabs (Cetirizine hcl) .... Use as needed 5)  Requip 0.5 Mg Tabs (Ropinirole hcl) .... One by mouth 1 hour before leg symptoms start 6)  Estrace 1 Mg Tabs (Estradiol) .... Take 1 tablet by mouth once a day 7)  Vitamin D 2000 Unit Tabs (Cholecalciferol) .Marland Kitchen.. 1 by mouth once daily 8)  Prevacid 30 Mg Cpdr (Lansoprazole) .... Take 1 tablet by mouth once a day 9)  Restoril 15 Mg Caps (Temazepam) .... One by mouth at bedtime as needed 10)  Wellbutrin Xl 300 Mg Xr24h-tab (Bupropion hcl) .Marland Kitchen.. 1 by mouth once daily 11)  Citalopram Hydrobromide 20 Mg Tabs (Citalopram hydrobromide) .Marland Kitchen.. 1 by mouth once daily 12)  Spironolactone 25 Mg Tabs (Spironolactone) .Marland Kitchen.. 1 by mouth once daily 13)  Singulair 10 Mg Tabs (Montelukast sodium) .... One by mouth at bedtime  Other Orders: Est. Patient Level III (78295) Sleep Disorder Referral  (Sleep Disorder)  Hypertension Assessment/Plan:      The patient's hypertensive risk group is category B: At least one risk factor (excluding diabetes) with no target organ damage.  Her calculated 10 year risk of coronary heart disease is 3 %.  Today's blood pressure is 112/70.    Patient Instructions: 1)  Try increasing dose of requip by 0.5 mg incriments.  You can increase the dose upto 2 mg per night as tolerated 2)  Talk to Dr. Alwyn Ren about whether you can stop citalopram 3)  Will arrange for mask fit at sleep lab for CPAP machine 4)  Will refill your script for singulair - follow up with Dr. [place] Callas for further refills 5)  Follow up in 4 months Prescriptions: SINGULAIR 10 MG TABS (MONTELUKAST SODIUM) one by mouth at bedtime  #30 x 1   Entered and Authorized by:  Coralyn Helling MD   Signed by:   Coralyn Helling MD on 06/25/2009   Method used:   Electronically to        [facility]  [phone]* (retail)       [address]       [place], [place]  [address]       Ph: [phone] or [phone]       Fax: [phone]   RxID:   [id number]

## 2010-07-02 NOTE — Assessment & Plan Note (Signed)
Summary: 4 months/apc   Visit Type:  Follow-up Copy to:  Sidney Ace Primary Provider/Referring Provider:  Dr. [name]  CC:  Pt here for follow up. Pt wants to discuss other options for CPAP machine mask. Pt states is only taking Symbicort as needed.  History of Present Illness: 65 yo female with mild OSA on CPAP 6,  and RLS  She has been using 0.5 mg requip at 6pm, and then takes another 1 mg later.  She does not feel this is controlling her leg symptoms.  As a result she has trouble falling asleep.  She has not been able to adjust to using CPAP.  Her main problem has been with the mask.  She has tried multiple mask types, but has been unsuccessful.  As a result she has stopped using CPAP.  She has noticed that her sleep is worse, and that she is more tired during the day.  She is also waking up more often to use the bathroom.       Current Medications (verified): 1)  Symbicort 160-4.5 Mcg/act Aero (Budesonide-Formoterol Fumarate) .... 2 Puffs Two Times A Day As Needed 2)  Proair Hfa 108 (90 Base) Mcg/act Aers (Albuterol Sulfate) .... As Directed 3)  Flonase 50 Mcg/act Susp (Fluticasone Propionate) .... As Needed 4)  Zyrtec Allergy 10 Mg  Tabs (Cetirizine Hcl) .... Use As Needed 5)  Requip 0.5 Mg Tabs (Ropinirole Hcl) .Marland Kitchen.. 1 By Mouth Late Afternoon and 2 By Mouth At Bedtime 6)  Estrace 1 Mg Tabs (Estradiol) .... Take 1 Tablet By Mouth Once A Day 7)  Zegerid 40-1100 Mg Caps (Omeprazole-Sodium Bicarbonate) .Marland Kitchen.. 1 By Mouth Once Daily As Needed 8)  Restoril 15 Mg Caps (Temazepam) .... One By Mouth At Bedtime As Needed 9)  Citalopram Hydrobromide 20 Mg Tabs (Citalopram Hydrobromide) .Marland Kitchen.. 1 By Mouth Once Daily 10)  Spironolactone 25 Mg Tabs (Spironolactone) .Marland Kitchen.. 1 By Mouth Once Daily 11)  Gabapentin 100 Mg Caps (Gabapentin) .... As Directed 12)  Vitamin B-12 100 Mcg Tabs (Cyanocobalamin) .Marland Kitchen.. 1 By Mouth Once Daily 13)  Calcium 500 Mg Tabs (Calcium) .Marland Kitchen.. 1 By Mouth Once  Daily  Allergies (verified): 1)  * Aspirin-Like Analgesic, Salicylates Group 2)  Reglan (Metoclopramide Hcl) 3)  Hydrochlorothiazide (Hydrochlorothiazide) 4)  Zantac 5)  Aleve (Naproxen Sodium) 6)  * Sulfa (Sulfonamides) Group 7)  Keflex (Cephalexin)  Past History:  Past Medical History: pyuria age 68;  Fibromyalgia; + lupus serology, Dr. Janalyn Rouse B Deveshwar  Diverticulosis, colon GERD Asthma, rhinitis      - PFT 01/29/09 FEV1 2.50(103%), FVC 2.88(88%), FEV1% 87, TLC 4.37(83%), DLCO 85%, no BD response      -  Followed by Dr. Sidney Ace Restless Leg Syndrome OSA      - RDI 8 03/18/08      - intolerant of CPAP Depression  Past Surgical History: Reviewed history from 04/03/2009 and no changes required. Colonoscopy 2006 : diverticulosis Hysterectomy for prolapse Lumpectomy, benign lesion; breast reduction Bladder tacking  G2 P2  Vital Signs:  Patient profile:   65 year old female Height:      66.5 inches Weight:      214 pounds BMI:     34.15 O2 Sat:      97 % on Room air Temp:     98.7 degrees F oral Pulse rate:   79 / minute BP sitting:   120 / 82  (left arm) Cuff size:   regular  Vitals Entered By: Zackery Barefoot CMA (  November 05, 2009 4:08 PM)  O2 Flow:  Room air CC: Pt here for follow up. Pt wants to discuss other options for CPAP machine mask. Pt states is only taking Symbicort as needed Comments Medications reviewed with patient Verified contact number and pharmacy with patient Zackery Barefoot CMA  November 05, 2009 4:09 PM    Physical Exam  General:  normal appearance and obese.   Nose:  no deformity, discharge, inflammation, or lesions, narrow nasal angles Mouth:  no deformity or lesions Neck:  no JVD.   Lungs:  clear bilaterally to auscultation and percussion Heart:  regular rate and rhythm, S1, S2 without murmurs, rubs, gallops, or clicks Extremities:  no clubbing, cyanosis, edema, or deformity noted Cervical Nodes:  no significant  adenopathy   Impression & Recommendations:  Problem # 1:  OBSTRUCTIVE SLEEP APNEA (ICD-327.23) She has not been able to adjust to using CPAP.  I reviewed other options to control her sleep apnea.  Will refer to dentistry to determine if she is a candidate for an oral appliance.  Advised her that her insurance may not cover the expense of an oral appliance.  Also explained that she would need to have follow up sleep testing once she is fitted with her oral appliance to determine that it is working to treat her sleep apnea.  Problem # 2:  RESTLESS LEG SYNDROME (ICD-333.94) I will have her use her requip at one time to see if this better controls her leg symptoms.    Medications Added to Medication List This Visit: 1)  Symbicort 160-4.5 Mcg/act Aero (Budesonide-formoterol fumarate) .... 2 puffs two times a day as needed 2)  Requip 0.5 Mg Tabs (Ropinirole hcl) .... 3 pills at bedtime 3)  Zegerid 40-1100 Mg Caps (Omeprazole-sodium bicarbonate) .Marland Kitchen.. 1 by mouth once daily as needed  Complete Medication List: 1)  Symbicort 160-4.5 Mcg/act Aero (Budesonide-formoterol fumarate) .... 2 puffs two times a day as needed 2)  Proair Hfa 108 (90 Base) Mcg/act Aers (Albuterol sulfate) .... As directed 3)  Flonase 50 Mcg/act Susp (Fluticasone propionate) .... As needed 4)  Zyrtec Allergy 10 Mg Tabs (Cetirizine hcl) .... Use as needed 5)  Requip 0.5 Mg Tabs (Ropinirole hcl) .... 3 pills at bedtime 6)  Estrace 1 Mg Tabs (Estradiol) .... Take 1 tablet by mouth once a day 7)  Zegerid 40-1100 Mg Caps (Omeprazole-sodium bicarbonate) .Marland Kitchen.. 1 by mouth once daily as needed 8)  Restoril 15 Mg Caps (Temazepam) .... One by mouth at bedtime as needed 9)  Citalopram Hydrobromide 20 Mg Tabs (Citalopram hydrobromide) .Marland Kitchen.. 1 by mouth once daily 10)  Spironolactone 25 Mg Tabs (Spironolactone) .Marland Kitchen.. 1 by mouth once daily 11)  Gabapentin 100 Mg Caps (Gabapentin) .... As directed 12)  Vitamin B-12 100 Mcg Tabs (Cyanocobalamin)  .Marland Kitchen.. 1 by mouth once daily 13)  Calcium 500 Mg Tabs (Calcium) .Marland Kitchen.. 1 by mouth once daily  Other Orders: Est. Patient Level III ([address]) Prescription Created Electronically [phone]) Dental Referral (Dentist)  Patient Instructions: 1)  Will arrange for dental evaluation for oral appliance to treat sleep apnea 2)  Use requip 0.5 mg pills: take 3 pills together one hour before your leg symptoms usually start 3)  Follow up in 3 to 4 months Prescriptions: REQUIP 0.5 MG TABS (ROPINIROLE HCL) 3 pills at bedtime  #90 x 6   Entered and Authorized by:   Coralyn Helling MD   Signed by:   Coralyn Helling MD on 11/05/2009   Method used:  Electronically to        [facility]  [phone]* (retail)       [address]       [place], [place]  [address]       Ph: [phone] or [phone]       Fax: [phone]   RxID:   [id number]     Immunization History:  Influenza Immunization History:    Influenza:  historical (03/03/2009)

## 2010-07-02 NOTE — Miscellaneous (Signed)
Summary: CPAP mask fit  XS mirage quattro fitted from sleep lab. Clinical Lists Changes  Orders: Added new Referral order of DME Referral (DME) - Signed

## 2010-07-02 NOTE — Progress Notes (Signed)
Summary: PRESCRIPT  Phone Note Call from Patient Call back at [phone]   Caller: Patient Call For: SOOD Summary of Call: NEED REQUIP CALLED TO PHARMACY [facility]  [place] Initial call taken by: Rickard Patience,  Oct 06, 2009 1:23 PM  Follow-up for Phone Call        Spoke with pt and made aware that rx has been sent and to be sure and keep followup here on 5/27.  Pt verbalized understanding. Follow-up by: Vernie Murders,  Oct 06, 2009 2:10 PM    Prescriptions: REQUIP 0.5 MG TABS (ROPINIROLE HCL) 1 by mouth late afternoon and 2 by mouth at bedtime  #90 x 0   Entered by:   Vernie Murders   Authorized by:   Coralyn Helling MD   Signed by:   Vernie Murders on 10/06/2009   Method used:   Electronically to        [facility]  [phone]* (retail)       [address]       [place], [place]  [address]       Ph: [phone] or [phone]       Fax: [phone]   RxID:   [id number]

## 2010-07-02 NOTE — Progress Notes (Signed)
Summary: waiting to hear re: cpap  Phone Note Call from Patient Call back at Work Phone [phone]   Caller: Patient Call For:  Summary of Call: pt still waiting (over a week now) to hear from [facility] re: increasing pressure of cpap. please advise.  Initial call taken by: Tivis Ringer,  February 17, 2009 4:12 PM  Follow-up for Phone Call        spoke to [facility] and they stated they do have the order and a RT should be contacting the pt this evening. Pt aware.  Carron Curie CMA  February 17, 2009 4:18 PM     21

## 2010-07-02 NOTE — Assessment & Plan Note (Signed)
Summary: pain under rib cage/kdc   Vital Signs:  Patient profile:   65 year old female Weight:      211.4 pounds Temp:     99.2 degrees F oral Pulse rate:   75 / minute Resp:     16 per minute BP sitting:   120 / 72  (left arm) Cuff size:   large  Vitals Entered By: Shonna Chock (July 30, 2009 3:35 PM) CC: Pain under left ribcage area x 4-5 days, no known injury. Comments REVIEWED MED LIST, PATIENT AGREED DOSE AND INSTRUCTION CORRECT    Primary Care Provider:  Marga Melnick  CC:  Pain under left ribcage area x 4-5 days and no known injury.Marland Kitchen  History of Present Illness:       This is a 65 year old female who presents with Chest pain 4-5 days w/o trigger or injury.  The patient reports resting chest pain, nausea, and indigestion, but denies vomiting, diaphoresis, shortness of breath, palpitations, dizziness, light headedness, and syncope.  The pain is described as intermittent and throbbing .  The pain is located in the left inferior  chest under ribs  and the pain does not radiate.  Episodes of chest pain last 5-10 minutes.  The pain is brought on or made worse by upper body movement.  The pain is relieved or improved with rest.  PMH of asthma; no flare with  these episode of chest pain. Flare of RLS recently for which meds increased. PMH of Fibromyalgia; Dr Corliss Skains seen  for burning in feet with Gabapentin.Dose reduced from 300mg  to 100 mg  at bedtime last week.? pain started close to that decrease in dose.  Allergies: 1)  * Aspirin-Like Analgesic, Salicylates Group 2)  Reglan (Metoclopramide Hcl) 3)  Hydrochlorothiazide (Hydrochlorothiazide) 4)  Zantac 5)  Aleve (Naproxen Sodium) 6)  * Sulfa (Sulfonamides) Group 7)  Keflex (Cephalexin)  Review of Systems General:  Denies chills, fever, and sweats. GI:  Complains of gas; denies abdominal pain, bloody stools, and dark tarry stools; Heartburn controlled by Zegerid. Derm:  Complains of changes in color of skin; denies lesion(s)  and rash; Intermittent facial flushing & burning of ears. Neuro:  Denies brief paralysis, numbness, tingling, and weakness; No radicular pain from L back.  Physical Exam  General:  well-nourished,in no acute distress; alert,appropriate and cooperative throughout examination Eyes:  No corneal or conjunctival inflammation noted. Perrla.No icterus Chest Wall:  No deformities, masses, or signicant  tenderness noted. Lungs:  Normal respiratory effort, chest expands symmetrically. Lungs are clear to auscultation, no crackles or wheezes. Heart:  Normal rate and regular rhythm. S1 and S2 normal without gallop, murmur, click, rub. S4 with slurring Abdomen:  Bowel sounds positive,abdomen soft and non-tender without masses, organomegaly or hernias noted. Extremities:  No clubbing, cyanosis, edema. Neg Homan's Neurologic:  alert & oriented X3, strength normal in all extremities, and DTRs symmetrical and normal.   Skin:  Intact without suspicious lesions or rashes Cervical Nodes:  No lymphadenopathy noted Axillary Nodes:  No palpable lymphadenopathy Psych:  memory intact for recent and remote, normally interactive, and good eye contact.     Impression & Recommendations:  Problem # 1:  CHEST PAIN (ICD-786.50)  T8 radicular pain vs atypical pleural irritation vs Hiatal Hernia  Orders: EKG w/ Interpretation (93000)  Problem # 2:  FIBROMYALGIA (ICD-729.1)  Her updated medication list for this problem includes:    Nabumetone 750 Mg Tabs (Nabumetone) .Marland Kitchen... As needed  Orders: EKG w/ Interpretation (93000)  Complete Medication List: 1)  Symbicort 160-4.5 Mcg/act Aero (Budesonide-formoterol fumarate) .... 2 puffs two times a day 2)  Proair Hfa 108 (90 Base) Mcg/act Aers (Albuterol sulfate) .... As directed 3)  Flonase 50 Mcg/act Susp (Fluticasone propionate) .... As needed 4)  Zyrtec Allergy 10 Mg Tabs (Cetirizine hcl) .... Use as needed 5)  Requip 0.5 Mg Tabs (Ropinirole hcl) .... One by mouth 1  hour before leg symptoms start 6)  Estrace 1 Mg Tabs (Estradiol) .... Take 1 tablet by mouth once a day 7)  Vitamin D 2000 Unit Tabs (Cholecalciferol) .Marland Kitchen.. 1 by mouth once daily 8)  Zegerid 40-1100 Mg Caps (Omeprazole-sodium bicarbonate) .Marland Kitchen.. 1 by mouth once daily 9)  Restoril 15 Mg Caps (Temazepam) .... One by mouth at bedtime as needed 10)  Wellbutrin Xl 300 Mg Xr24h-tab (Bupropion hcl) .Marland Kitchen.. 1 by mouth once daily 11)  Citalopram Hydrobromide 20 Mg Tabs (Citalopram hydrobromide) .Marland Kitchen.. 1 by mouth once daily 12)  Spironolactone 25 Mg Tabs (Spironolactone) .Marland Kitchen.. 1 by mouth once daily 13)  Singulair 10 Mg Tabs (Montelukast sodium) .... One by mouth at bedtime 14)  Silymarin Caps (Milk thistle-turmeric) .Marland Kitchen.. 1 by mouth once daily 15)  Nabumetone 750 Mg Tabs (Nabumetone) .... As needed 16)  Gabapentin 100 Mg Caps (Gabapentin) .... As directed 17)  Vitamin B-12 100 Mcg Tabs (Cyanocobalamin) .Marland Kitchen.. 1 by mouth once daily 18)  Align Caps (Probiotic product) .Marland Kitchen.. 1 by mouth once daily 19)  Calcium 500 Mg Tabs (Calcium) .Marland Kitchen.. 1 by mouth once daily 20)  Vitamin E 200 Unit Caps (Vitamin e) .Marland Kitchen.. 1 by mouth once daily 21)  Hyoscyamine Sulfate 0.125 Mg Subl (Hyoscyamine sulfate) .Marland Kitchen.. 1 sl q 6-8 hrs as needed for luq pain  Patient Instructions: 1)  Avoid foods high in acid (tomatoes, citrus juices, spicy foods). Avoid eating within two hours of lying down or before exercising. Do not over eat; try smaller more frequent meals. Elevate head of bed twelve inches when sleeping. Monitor stools for black  color, tarry character  or blood. Prescriptions: HYOSCYAMINE SULFATE 0.125 MG SUBL (HYOSCYAMINE SULFATE) 1 SL q 6-8 hrs as needed for LUQ pain  #30 x 1   Entered and Authorized by:   Marga Melnick MD   Signed by:   Marga Melnick MD on 07/30/2009   Method used:   Faxed to ...       [facility]  [address]  [phone]* (retail)       [address]       [place], [place]  [address]       Ph:  [phone] or [phone]       Fax: [phone]   RxID:   [id number]

## 2010-07-02 NOTE — Assessment & Plan Note (Signed)
Summary: OSA & RLS/follow-up/LC   Visit Type:  Follow-up Copy to:  Sidney Ace Primary /Referring :  Dr. [name]  CC:  OSA...RLS...the patient says she did not have dental eval for oral appliance but is using her cpap every night and doing well...she c/o increased SOB with exertion.  History of Present Illness: 65 yo female with mild OSA on CPAP 6,  and RLS  She has been using her CPAP on a more consistent basis.  She does feel like this helps.  She went to the beach recently, and forgot to take her machine.  As a result her sleep was terrible, and she was exhausted the whole trip.  As a result of this she did not see the dentist in [facility] for an oral appliance.  She takes 0.5 mg requip at 6 pm.  She then takes 1 mg before bed at 1030pm.  This controls her leg symptoms.  She has also been using neurontin.  She falls asleep after 20 minutes, and sleeps through the night.  She wakes up at 630am, and feels rested.  She is not needing to take naps anymore.  She was having more trouble with her asthma, but was restarted on her asthma medicines by Dr. [place] Callas, and this has helped.        Current Medications (verified): 1)  Symbicort 160-4.5 Mcg/act Aero (Budesonide-Formoterol Fumarate) .... 2 Puffs in The Morning 2)  Proair Hfa 108 (90 Base) Mcg/act Aers (Albuterol Sulfate) .... As Directed 3)  Flonase 50 Mcg/act Susp (Fluticasone Propionate) .... As Needed 4)  Zyrtec Allergy 10 Mg  Tabs (Cetirizine Hcl) .... Use As Needed 5)  Requip 0.5 Mg Tabs (Ropinirole Hcl) .... 3 Pills At Bedtime 6)  Estrace 1 Mg Tabs (Estradiol) .... Take 1 Tablet By Mouth Once A Day 7)  Zegerid 40-1100 Mg Caps (Omeprazole-Sodium Bicarbonate) .Marland Kitchen.. 1 By Mouth Once Daily As Needed 8)  Restoril 15 Mg Caps (Temazepam) .... One By Mouth At Bedtime As Needed 9)  Citalopram Hydrobromide 20 Mg Tabs (Citalopram Hydrobromide) .Marland Kitchen.. 1 By Mouth Once Daily 10)  Spironolactone 25 Mg Tabs  (Spironolactone) .Marland Kitchen.. 1 By Mouth Once Daily 11)  Gabapentin 100 Mg Caps (Gabapentin) .... As Directed 12)  Vitamin B-12 100 Mcg Tabs (Cyanocobalamin) .Marland Kitchen.. 1 By Mouth Once Daily 13)  Calcium 500 Mg Tabs (Calcium) .Marland Kitchen.. 1 By Mouth Once Daily  Allergies (verified): 1)  * Aspirin-Like Analgesic, Salicylates Group 2)  Reglan (Metoclopramide Hcl) 3)  Hydrochlorothiazide (Hydrochlorothiazide) 4)  Zantac 5)  Aleve (Naproxen Sodium) 6)  * Sulfa (Sulfonamides) Group 7)  Keflex (Cephalexin)  Past History:  Past Medical History: pyuria age 89;  Fibromyalgia; + lupus serology, Dr. Janalyn Rouse B Deveshwar  Diverticulosis, colon GERD Asthma, rhinitis      - PFT 01/29/09 FEV1 2.50(103%), FVC 2.88(88%), FEV1% 87, TLC 4.37(83%), DLCO 85%, no BD response      -  Followed by Dr. Sidney Ace Restless Leg Syndrome OSA      - RDI 8 03/18/08      - CPAP 6 cm H2O Depression  Past Surgical History: Reviewed history from 04/03/2009 and no changes required. Colonoscopy 2006 : diverticulosis Hysterectomy for prolapse Lumpectomy, benign lesion; breast reduction Bladder tacking  G2 P2  Review of Systems       The patient complains of shortness of breath with activity, non-productive cough, and nasal congestion/difficulty breathing through nose.  The patient denies coughing up blood, chest pain, weight change, abdominal pain, difficulty swallowing, sore  throat, headaches, sneezing, depression, hand/feet swelling, change in color of mucus, and fever.    Vital Signs:  Patient profile:   65 year old female Height:      66.5 inches (168.91 cm) Weight:      218 pounds (99.09 kg) BMI:     34.78 O2 Sat:      94 % on Room air Temp:     98.7 degrees F (37.06 degrees C) oral Pulse rate:   96 / minute BP sitting:   112 / 70  (left arm) Cuff size:   regular  Vitals Entered By: Michel Bickers CMA (February 19, 2010 2:46 PM)  O2 Sat at Rest %:  94 O2 Flow:  Room air CC: OSA...RLS...the patient says she did not  have dental eval for oral appliance but is using her cpap every night and doing well...she c/o increased SOB with exertion Comments Medications reviewed with patient Daytime phone verified. Michel Bickers [facility]  February 19, 2010 2:47 PM   Physical Exam  General:  normal appearance and obese.   Nose:  no deformity, discharge, inflammation, or lesions, narrow nasal angles Mouth:  no deformity or lesions Neck:  no JVD.   Lungs:  clear bilaterally to auscultation and percussion Heart:  regular rate and rhythm, S1, S2 without murmurs, rubs, gallops, or clicks Extremities:  no clubbing, cyanosis, edema, or deformity noted Neurologic:  normal CN II-XII and strength normal.   Cervical Nodes:  no significant adenopathy Psych:  alert and cooperative; normal mood and affect; normal attention span and concentration   Impression & Recommendations:  Problem # 1:  OBSTRUCTIVE SLEEP APNEA (ICD-327.23) She has restarted CPAP and is doing better.  I have encouraged her to maintain her compliance.  I will get a copy of her CPAP download.  Problem # 2:  RESTLESS LEG SYNDROME (ICD-333.94) This is stable on her current regimen of requip 0.5 mg at 6pm, and 1 mg before bedtime.  Medications Added to Medication List This Visit: 1)  Symbicort 160-4.5 Mcg/act Aero (Budesonide-formoterol fumarate) .... 2 puffs in the morning  Complete Medication List: 1)  Symbicort 160-4.5 Mcg/act Aero (Budesonide-formoterol fumarate) .... 2 puffs in the morning 2)  Proair Hfa 108 (90 Base) Mcg/act Aers (Albuterol sulfate) .... As directed 3)  Flonase 50 Mcg/act Susp (Fluticasone propionate) .... As needed 4)  Zyrtec Allergy 10 Mg Tabs (Cetirizine hcl) .... Use as needed 5)  Requip 0.5 Mg Tabs (Ropinirole hcl) .... 3 pills at bedtime 6)  Estrace 1 Mg Tabs (Estradiol) .... Take 1 tablet by mouth once a day 7)  Zegerid 40-1100 Mg Caps (Omeprazole-sodium bicarbonate) .Marland Kitchen.. 1 by mouth once daily as needed 8)  Restoril 15 Mg Caps  (Temazepam) .... One by mouth at bedtime as needed 9)  Citalopram Hydrobromide 20 Mg Tabs (Citalopram hydrobromide) .Marland Kitchen.. 1 by mouth once daily 10)  Spironolactone 25 Mg Tabs (Spironolactone) .Marland Kitchen.. 1 by mouth once daily 11)  Gabapentin 100 Mg Caps (Gabapentin) .... As directed 12)  Vitamin B-12 100 Mcg Tabs (Cyanocobalamin) .Marland Kitchen.. 1 by mouth once daily 13)  Calcium 500 Mg Tabs (Calcium) .Marland Kitchen.. 1 by mouth once daily  Other Orders: Est. Patient Level III (16109) DME Referral (DME)  Patient Instructions: 1)  Follow up in 6 months Prescriptions: REQUIP 0.5 MG TABS (ROPINIROLE HCL) 3 pills at bedtime  #270 x 4   Entered by:   Michel Bickers CMA   Authorized by:   Coralyn Helling MD   Signed by:   Lawson Fiscal  Cooper CMA on 02/19/2010   Method used:   Faxed to ...       [facility] (mail-order)             , [place]         Ph: [phone]       Fax: [phone]   RxID:   [id number]    Immunization History:  Influenza Immunization History:    Influenza:  historical (02/17/2010)

## 2010-07-02 NOTE — Letter (Signed)
Summary: [facility]  [facility]   Imported By: Lanelle Bal 08/07/2009 10:54:40  _____________________________________________________________________  External Attachment:    Type:   Image     Comment:   External Document

## 2010-08-05 ENCOUNTER — Ambulatory Visit (INDEPENDENT_AMBULATORY_CARE_PROVIDER_SITE_OTHER): Payer: 59 | Admitting: Pulmonary Disease

## 2010-08-05 ENCOUNTER — Encounter: Payer: Self-pay | Admitting: Pulmonary Disease

## 2010-08-05 DIAGNOSIS — G4733 Obstructive sleep apnea (adult) (pediatric): Secondary | ICD-10-CM

## 2010-08-05 DIAGNOSIS — G2581 Restless legs syndrome: Secondary | ICD-10-CM

## 2010-08-11 NOTE — Assessment & Plan Note (Signed)
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 [name]  CC:  follow up osa. Pt states she uses her cpap machine everynight x 6-7 hrs a night. Pt states she dropped her water container and it cracked and wants to know we can order a new one. Pt states she feels like her mask has lost it's seal. Pt also states she is having more problems with her RLS with an increase in her leg jerks.  History of Present Illness: 65 yo female with mild OSA on CPAP 6,  and RLS  She has been doing well with CPAP.  Her humidifer cracked recently.  She has also noticed some leak from her mask.  She was started on cymbalta recently, and her leg symptoms got worse after that.  She is being taperred off this.  Her allergies have been flaring up over the past two weeks.  Her husband has been hospitalized with Stage IV stomach cancer.       Current Medications (verified): 1)  Symbicort 160-4.5 Mcg/act Aero (Budesonide-Formoterol Fumarate) .... 2 Puffs Two Times A Day 2)  Proair Hfa 108 (90 Base) Mcg/act Aers (Albuterol Sulfate) .... As Directed 3)  Flonase 50 Mcg/act Susp (Fluticasone Propionate) .... As Needed 4)  Zyrtec Allergy 10 Mg  Tabs (Cetirizine Hcl) .... Use As Needed 5)  Requip 0.5 Mg Tabs (Ropinirole Hcl) .... 3 Pills At Bedtime 6)  Estrace 1 Mg Tabs (Estradiol) .... Take 1 Tablet By Mouth Once A Day 7)  Zegerid 40-1100 Mg Caps (Omeprazole-Sodium Bicarbonate) .Marland Kitchen.. 1 By Mouth Once Daily As Needed 8)  Restoril 15 Mg Caps (Temazepam) .... One By Mouth At Bedtime As Needed 9)  Spironolactone 25 Mg Tabs (Spironolactone) .Marland Kitchen.. 1 By Mouth Once Daily 10)  Gabapentin 100 Mg Caps (Gabapentin) .... 3 Tablets Daily 11)  Vitamin B-12 100 Mcg Tabs (Cyanocobalamin) .Marland Kitchen.. 1 By Mouth Once Daily 12)  Calcium 500 Mg Tabs (Calcium) .Marland Kitchen.. 1 By Mouth Once Daily  Allergies: 1)  * Aspirin-Like Analgesic, Salicylates Group 2)  Reglan  (Metoclopramide Hcl) 3)  Hydrochlorothiazide (Hydrochlorothiazide) 4)  Aleve (Naproxen Sodium) 5)  * Sulfa (Sulfonamides) Group 6)  Keflex (Cephalexin)  Past History:  Past Medical History: Last updated: 02/19/2010 pyuria age 37;  Fibromyalgia; + lupus serology, Dr. Janalyn Rouse B Deveshwar  Diverticulosis, colon GERD Asthma, rhinitis      - PFT 01/29/09 FEV1 2.50(103%), FVC 2.88(88%), FEV1% 87, TLC 4.37(83%), DLCO 85%, no BD response      -  Followed by Dr. Sidney Ace Restless Leg Syndrome OSA      - RDI 8 03/18/08      - CPAP 6 cm H2O Depression  Past Surgical History: Last updated: 04/03/2009 Colonoscopy 2006 : diverticulosis Hysterectomy for prolapse Lumpectomy, benign lesion; breast reduction Bladder tacking  G2 P2  Family History: Last updated: 04/03/2009 Father: ETOH abuse; Mother: renal CA with mets,dementia, DM; Siblings: neg Granddaughter DM; MGF DM,?Tb  Social History: Last updated: 06/25/2009 Married.  Husband being treated for stomach cancer. Occupation:Merchandising Specilaist Never Smoked Alcohol use-no Regular exercise-yes  Vital Signs:  Patient profile:   65 year old female Height:      66.5 inches Weight:      214.25 pounds BMI:     34.19 O2 Sat:      100 % on Room air Temp:     98.1 degrees F oral Pulse rate:   85 / minute BP sitting:   116 /  70  (right arm) Cuff size:   regular  Vitals Entered By: Carver Fila (August 05, 2010 2:51 PM)  O2 Flow:  Room air CC: follow up osa. Pt states she uses her cpap machine everynight x 6-7 hrs a night. Pt states she dropped her water container and it cracked and wants to know we can order a new one. Pt states she feels like her mask has lost it's seal. Pt also states she is having more problems with her RLS with an increase in her leg jerks Comments meds and allergies updated Phone number updated Carver Fila  August 05, 2010 2:51 PM    Physical Exam  General:  normal appearance and obese.   Nose:   no deformity, discharge, inflammation, or lesions, narrow nasal angles Mouth:  no deformity or lesions Neck:  no JVD.   Lungs:  clear bilaterally to auscultation and percussion Heart:  regular rate and rhythm, S1, S2 without murmurs, rubs, gallops, or clicks Extremities:  no clubbing, cyanosis, edema, or deformity noted Neurologic:  normal CN II-XII.   Cervical Nodes:  no significant adenopathy   Impression & Recommendations:  Problem # 1:  OBSTRUCTIVE SLEEP APNEA (ICD-327.23) She has done well with CPAP.  WIll get her mask fit assessed, and arrange for a new humidifer.  Problem # 2:  RESTLESS LEG SYNDROME (ICD-333.94) Her leg symptoms got worse after she was started on cymbalta.  She is being weaned off this.  Will continue current dose of requip for now, and then reassess after she is off cymbalta.  Medications Added to Medication List This Visit: 1)  Symbicort 160-4.5 Mcg/act Aero (Budesonide-formoterol fumarate) .... 2 puffs two times a day 2)  Gabapentin 100 Mg Caps (Gabapentin) .... 3 tablets daily  Complete Medication List: 1)  Symbicort 160-4.5 Mcg/act Aero (Budesonide-formoterol fumarate) .... 2 puffs two times a day 2)  Proair Hfa 108 (90 Base) Mcg/act Aers (Albuterol sulfate) .... As directed 3)  Flonase 50 Mcg/act Susp (Fluticasone propionate) .... As needed 4)  Zyrtec Allergy 10 Mg Tabs (Cetirizine hcl) .... Use as needed 5)  Requip 0.5 Mg Tabs (Ropinirole hcl) .... 3 pills at bedtime 6)  Estrace 1 Mg Tabs (Estradiol) .... Take 1 tablet by mouth once a day 7)  Zegerid 40-1100 Mg Caps (Omeprazole-sodium bicarbonate) .Marland Kitchen.. 1 by mouth once daily as needed 8)  Restoril 15 Mg Caps (Temazepam) .... One by mouth at bedtime as needed 9)  Spironolactone 25 Mg Tabs (Spironolactone) .Marland Kitchen.. 1 by mouth once daily 10)  Gabapentin 100 Mg Caps (Gabapentin) .... 3 tablets daily 11)  Vitamin B-12 100 Mcg Tabs (Cyanocobalamin) .Marland Kitchen.. 1 by mouth once daily 12)  Calcium 500 Mg Tabs (Calcium)  .Marland Kitchen.. 1 by mouth once daily  Other Orders: Est. Patient Level III (56213) DME Referral (DME)  Patient Instructions: 1)  Follow up in 6 months   Immunization History:  Pneumovax Immunization History:    Pneumovax:  historical (02/28/2010)

## 2010-09-02 LAB — URINALYSIS, ROUTINE W REFLEX MICROSCOPIC
Bilirubin Urine: NEGATIVE
Nitrite: NEGATIVE
Specific Gravity, Urine: 1.01 (ref 1.005–1.030)
pH: 7 (ref 5.0–8.0)

## 2010-09-02 LAB — CBC
HCT: 32.8 % — ABNORMAL LOW (ref 36.0–46.0)
Platelets: 235 10*3/uL (ref 150–400)
WBC: 14.5 10*3/uL — ABNORMAL HIGH (ref 4.0–10.5)

## 2010-10-13 NOTE — Procedures (Signed)
NAMEMERIA, Cassandra [name]                 ACCOUNT NO.:  [id number]   MEDICAL RECORD NO.:  [id number]          PATIENT TYPE:  OUT   LOCATION:  SLEEP CENTER                 FACILITY[facility]  [facility]   PHYSICIAN:  Coralyn Helling, MD        DATE OF BIRTH:  12-01-1945   DATE OF STUDY:  03/28/2008                            NOCTURNAL POLYSOMNOGRAM   REFERRING PHYSICIAN:   INDICATION:  [name] is a 65 year old female who has history of  depression, fibromyalgia, and restless legs syndrome.  She has sleep  disruption and excessive daytime sleepiness.  She was referred to the  sleep lab for evaluation of sleep- disordered breathing and periodic  limb movement disorder.   Height is 5 feet 5 inches, weight is 211 pounds, BMI is 35, neck size is  15 inches.   MEDICATIONS:  Prilosec, citalopram, Singulair, spironolactone, Zyrtec,  Wellbutrin, and Advair.   Epworth score is 9.   SLEEP ARCHITECTURE:  Total recording time was 419 minutes.  Total sleep  time was 335 minutes.  Sleep efficiency is 80%.  Sleep latency is 23  minutes.  REM latency is 133 minutes.  The study was notable for lack of  slow wave sleep.  The patient slept in both the supine and nonsupine  positions.   RESPIRATORY DATA:  The average respiratory rate was 17, moderate snoring  was noted by the technician.  The overall apnea-hypopnea index was 2.  The overall respiratory disturbance index was 8.  Of note is that the  patient had an apnea-hypopnea index of 11 in the supine position during  REM sleep.   OXYGEN DATA:  The baseline oxygenation was 98%.  Oxygen saturation nadir  was 90%.   CARDIAC DATA:  The average heart rate was 74 and the rhythm strip showed  normal sinus rhythm.   MOVEMENT-PARASOMNIA:  The patient had frequent leg movement, not all of  which would fit criteria to classify as periodic limb movement.  She had  one rest room trip.   IMPRESSION:  This study shows evidence for mild sleep-disordered  breathing with the  respiratory disturbance index of 8.  Of note, is that  the predominance of her events were related to REM sleep in the supine  position.   She had frequent leg movement noted during the study not all of which  fit diagnostic criteria for periodic limb movements and further clinical  correlation will be necessary to determine the significance of this.   The patient should be counseled with regards to importance of diet,  exercise, and weight reduction.  In addition the patient should try  positional therapy.  If these interventions are unsuccessful and the  patient remains symptomatic then further therapeutic considerations  could include either CPAP therapy, oral appliance, or surgical  intervention.      Coralyn Helling, MD  Diplomat, American Board of Sleep Medicine  Electronically Signed     VS/MEDQ  D:  04/04/2008 08:02:13  T:  04/04/2008 21:41:47  Job:  [id number]

## 2010-10-13 NOTE — Procedures (Signed)
Cassandra [name], Cassandra [name]                 ACCOUNT NO.:  [id number]   MEDICAL RECORD NO.:  [id number]          PATIENT TYPE:  OUT   LOCATION:  SLEEP CENTER                 FACILITY:  [facility]   PHYSICIAN:  Coralyn Helling, MD        DATE OF BIRTH:  11/07/1945   DATE OF STUDY:  08/15/2008                            NOCTURNAL POLYSOMNOGRAM   REFERRING PHYSICIAN:  Coralyn Helling, MD   INDICATION FOR STUDY:  Ms. Sylvain is a 65 year old female who has a  history of obstructive sleep apnea.  She had a polysomnogram done on  March 28, 2008, and was found to have a respiratory disturbance index  of 8.  She was on CPAP of 9 cm of water but continued to have  difficulties with sleep disruption and daytime sleepiness.  She also has  a history of restless leg syndrome related to iron deficiency anemia.  She is referred to the sleep lab for a CPAP titration study.   Height is 5 feet 5 inches, weight is 206 pounds.  BMI is 34.  Neck size  is 15 inches.   EPWORTH SLEEPINESS SCORE:  11.   MEDICATIONS:  Advair, Flonase, spironolactone, albuterol, Zyrtec, iron,  estradiol, Singulair, vitamin D, vitamin C, and Zegerid.   SLEEP ARCHITECTURE:  Total recording time is 420 minutes.  Total sleep  time was 251 minutes.  Sleep efficiency was 60%.  Sleep latency is 23  minutes.  REM latency is 82 minutes.  The study was notable for lack of  slow-wave sleep.  The patient slept in both supine and nonsupine  position.   RESPIRATORY DATA:  The average respiratory rate was 15.  The patient was  titrated from a CPAP pressure setting of 5-6 cm of water.  At a CPAP  pressure setting of 6 cm water, the apnea-hypopnea index was reduced to  0.  At this pressure setting, the patient was observed in REM sleep and  supine sleep, and snoring was limited.   OXYGEN DATA:  The baseline oxygenation was 95%.  The oxygen saturation  data was 91%.   CARDIAC DATA:  The average heart rate was 65 and the rhythm strip showed  normal sinus  rhythm.   MOVEMENT-PARASOMNIA:  The periodic limb movement index was 3.8, and the  patient had 1 rest room trip.   IMPRESSIONS-RECOMMENDATIONS:  The patient did well with a continuous  positive airway pressure setting of 6 cm of water.  At this pressure,  she was observed in both REM sleep and supine sleep.   She was fitted with a ResMed Mirage Quattro Full Face Mask medium size  and appeared to do well with this.   In addition to diet, exercise, weight reduction, the patient should be  started on CPAP of 6 cm of water and monitored for her clinical  response.      Coralyn Helling, MD  Diplomat, American Board of Sleep Medicine  Electronically Signed     VS/MEDQ  D:  08/21/2008 11:50:30  T:  08/22/2008 16:10:96  Job:  [id number]

## 2010-10-16 NOTE — Letter (Signed)
December 03, 2009     RE:  GESELLE, CARDOSA  MRN:  [id number]  /  DOB:  08-09-1945   To Whom It May Concern:   Halcyon Heck is currently under my care at [facility] for her  obstructive sleep apnea.  She had an overnight polysomnogram from  March 18, 2008, which showed a respiratory disturbance index of 8  consistent with mild sleep apnea.  She does also have a history of  depression.  She has been tried on CPAP therapy without success.  Attempts have been made to change her pressure setting as well as try  her on multiple different mask types.  Unfortunately, she has not been  able to tolerate any of these changes and as a result is unable to  continue with CPAP therapy.  I have therefore recommended that she be  fitted with an oral appliance for further treatment of her obstructive  sleep apnea.  If you have any questions regarding this, please feel free  to contact me at [phone].    Sincerely,      Coralyn Helling, MD  Electronically Signed    VS/MedQ  DD: 12/03/2009  DT: 12/04/2009  Job #: [id number]

## 2011-01-12 ENCOUNTER — Telehealth: Payer: Self-pay | Admitting: Internal Medicine

## 2011-01-12 NOTE — Telephone Encounter (Signed)
Left a message for patient to call me. 

## 2011-01-13 NOTE — Telephone Encounter (Signed)
Left a message for patient to call me. 

## 2011-01-13 NOTE — Telephone Encounter (Signed)
Left a message for Cassandra [name] at Dr. Carolee Rota office that patient has not called back to schedule OV.

## 2011-01-14 NOTE — Telephone Encounter (Signed)
Spoke with patient and explained the procedure for changing physicians in this practice. She does not want to pursue this. States she does not want to make an appointment. Spoke with Dennie Bible at Dr. Carolee Rota office and told her patient's decision.

## 2011-01-14 NOTE — Telephone Encounter (Signed)
Left a message again.

## 2011-02-16 ENCOUNTER — Encounter: Payer: Self-pay | Admitting: Pulmonary Disease

## 2011-02-17 ENCOUNTER — Encounter: Payer: Self-pay | Admitting: Pulmonary Disease

## 2011-02-17 ENCOUNTER — Ambulatory Visit (INDEPENDENT_AMBULATORY_CARE_PROVIDER_SITE_OTHER): Payer: 59 | Admitting: Pulmonary Disease

## 2011-02-17 DIAGNOSIS — G2581 Restless legs syndrome: Secondary | ICD-10-CM

## 2011-02-17 DIAGNOSIS — G4733 Obstructive sleep apnea (adult) (pediatric): Secondary | ICD-10-CM

## 2011-02-17 MED ORDER — ROPINIROLE HCL 0.5 MG PO TABS: ORAL_TABLET | ORAL | Status: DC

## 2011-02-17 NOTE — Assessment & Plan Note (Signed)
She reports benefit from CPAP and is compliant with therapy.  No change to current set up.

## 2011-02-17 NOTE — Patient Instructions (Signed)
Follow up in 6 months 

## 2011-02-17 NOTE — Progress Notes (Signed)
Subjective:    Patient ID: Cassandra [name], female    DOB: 06/16/1945, 65 y.o.   MRN: [id number]  HPI CC: Cassandra [name]  65 yo female with mild OSA on CPAP 6, and RLS  She has noticed more trouble with her leg symptoms.  She is taking 1.5 mg requip around 6 pm.  If she is late with her dose, then she notices more trouble.  She has noticed problems with bruising, and is due to see Dr. Renne Crigler for blood work.  This started after she started celebrex.  She has been doing well with her CPAP.  It has been difficult for her since her husband passed away last 08-20-2022.  She was changed from cymbalta to wellbutrin.  Past Medical History  Diagnosis Date  . Fibromyalgia   . Asthma   . OSA (obstructive sleep apnea)   . Depression   . GERD (gastroesophageal reflux disease)   . Pyuria   . Diverticulosis   . RLS (restless legs syndrome)      Family History  Problem Relation Age of Onset  . Kidney cancer Mother   . Diabetes Mother     History   Social History  . Marital Status: Widowed   Occupational History  . merchandising [employer]    Social History Main Topics  . Smoking status: Never Smoker    Allergies  Allergen Reactions  . Cephalexin     REACTION: unspecified  . Hydrochlorothiazide     REACTION: unspecified  . Metoclopramide Hcl     REACTION: unspecified  . Naproxen Sodium     REACTION: unspecified  . Ranitidine Hcl     REACTION: unspecified  . Sulfonamide Derivatives     REACTION: unspecified    Review of Systems     Objective:   Physical Exam  BP 138/78  Pulse 84  Temp(Src) 98.8 F (37.1 C) (Oral)  Ht 5' 5.5" (1.664 m)  Wt 204 lb (92.534 kg)  BMI 33.43 kg/m2  SpO2 98%  General - Healthy, tearful HEENT - no sinus tenderness, no oral exudate, no LAN Chest - CTA Cardiac - s1s2 regular, no murmur Abd - soft, nontender Ext - no edema Neuro - normal strength     Assessment & Plan:   OBSTRUCTIVE SLEEP APNEA She reports benefit from  CPAP and is compliant with therapy.  No change to current set up.  RESTLESS LEG SYNDROME She has noticed more trouble with her leg symptoms.  She is scheduled to have lab work with Dr. Renne Crigler.  I have asked her to forward her lab tests to my office for my review.  Main concern would be if she has anemia, iron deficiency, or low ferritin level which could be contributing to her leg symptoms.  If her labs are okay, then she could try increasing her requip to 2 mg per day.    Updated Medication List Outpatient Encounter Prescriptions as of 02/17/2011  Medication Sig Dispense Refill  . albuterol (PROVENTIL HFA;VENTOLIN HFA) 108 (90 BASE) MCG/ACT inhaler Inhale 2 puffs into the lungs every 4 (four) hours as needed.        . budesonide-formoterol (SYMBICORT) 160-4.5 MCG/ACT inhaler Inhale 2 puffs into the lungs 2 (two) times daily as needed.       Marland Kitchen buPROPion (WELLBUTRIN XL) 300 MG 24 hr tablet Take 300 mg by mouth daily.        . Calcium Carbonate (CALCIUM 500 PO) Once a day       .  CELEBREX 200 MG capsule Once a day      . cetirizine (ZYRTEC) 10 MG tablet Take 10 mg by mouth daily as needed.        . cyanocobalamin 100 MCG tablet Take 100 mcg by mouth daily.        Marland Kitchen estradiol (ESTRACE) 1 MG tablet Take 1 mg by mouth daily.        . fluticasone (FLONASE) 50 MCG/ACT nasal spray Place 2 sprays into the nose daily.        Marland Kitchen gabapentin (NEURONTIN) 100 MG capsule Take 100 mg by mouth 3 (three) times daily.        Marland Kitchen omeprazole-sodium bicarbonate (ZEGERID) 40-1100 MG per capsule Take 1 capsule by mouth daily.        Marland Kitchen rOPINIRole (REQUIP) 0.5 MG tablet Three pills at night      . spironolactone (ALDACTONE) 25 MG tablet Take 25 mg by mouth daily.        Marland Kitchen DISCONTD: rOPINIRole (REQUIP) 0.5 MG tablet Take 0.5 mg by mouth 3 (three) times daily.        . temazepam (RESTORIL) 15 MG capsule Take 15 mg by mouth at bedtime as needed.

## 2011-02-17 NOTE — Assessment & Plan Note (Signed)
She has noticed more trouble with her leg symptoms.  She is scheduled to have lab work with Dr. Renne Crigler.  I have asked her to forward her lab tests to my office for my review.  Main concern would be if she has anemia, iron deficiency, or low ferritin level which could be contributing to her leg symptoms.  If her labs are okay, then she could try increasing her requip to 2 mg per day.

## 2011-03-17 ENCOUNTER — Encounter: Payer: Self-pay | Admitting: *Deleted

## 2011-03-17 ENCOUNTER — Telehealth: Payer: Self-pay | Admitting: Pulmonary Disease

## 2011-03-17 NOTE — Telephone Encounter (Signed)
Labs received and placed in VS's lookat

## 2011-03-17 NOTE — Telephone Encounter (Signed)
Called, spoke with pt.  She is requesting a new rx for requip 0.5 mg for 4 tablets qhs - [facility].  States she was told at last OV she could increase this medication to 4 tablets for RLS so she has been doing this.  However, per last OV with Dr. Craige Cotta on 02/17/11, looks like Dr. Craige Cotta had asked pt to have lab results from Dr. Carolee Rota office sent prior to making this change.  Pt does not remember if she had these lab tests done and requesting I call Dr. Carolee Rota office to see.  She is aware we will have to ask Dr. Craige Cotta if this rx is ok.  She has approx 15-20 pills left and is ok with waiting for an answer on tomorrow when Dr. Craige Cotta returns to the office.  In the meantime, I have called Dr. Carolee Rota office and lmovm for Misty Stanley in Medical Records to fax over recent labs done on pt to triage fax number.  Will await results.

## 2011-03-19 NOTE — Telephone Encounter (Signed)
Called and spoke with pt and she is aware ok for refills but only given for 30 day supply. Pt stated that she normally gets these through [facility]---pt will call back after lunch--she is going to see how much the rx will cost her at her [facility] pharmacy.

## 2011-03-19 NOTE — Telephone Encounter (Signed)
Okay to send script.  Give 30 day supply with 5 refills.

## 2011-03-19 NOTE — Telephone Encounter (Signed)
Pt called back and she stated that [facility] cannot give her an estimate on the requip.  She is requesting that we send in a 90 day supply to [facility] for this.  She stated that she has always got this med from [facility] and if [facility] cannot fill this med for a 90 day supply pt stated that she will just stop taking it.  VS  Please advise. thanks

## 2011-03-19 NOTE — Telephone Encounter (Signed)
Pt is returning [name] call & can be reached at [phone].  Cassandra [name]

## 2011-03-22 MED ORDER — ROPINIROLE HCL 2 MG PO TABS: 2.0000 mg | ORAL_TABLET | Freq: Every day | ORAL | Status: DC

## 2011-03-22 NOTE — Telephone Encounter (Signed)
Okay to give 90 day script with 3 refills to be sent to [facility].

## 2011-03-22 NOTE — Telephone Encounter (Signed)
lmomtcb  

## 2011-03-22 NOTE — Telephone Encounter (Signed)
PT RETURNED CALL FROM TRIAGE.

## 2011-03-22 NOTE — Telephone Encounter (Signed)
Rx for requip was sent to [facility] for pt to be made aware.

## 2011-03-30 ENCOUNTER — Encounter: Payer: Self-pay | Admitting: Gastroenterology

## 2011-04-16 ENCOUNTER — Telehealth: Payer: Self-pay | Admitting: Gastroenterology

## 2011-04-16 NOTE — Telephone Encounter (Signed)
I spoke to pt. Apologized and assured we will not call her anymore & will advice referring provider.

## 2011-06-01 HISTORY — PX: OTHER SURGICAL HISTORY: SHX169

## 2011-06-15 ENCOUNTER — Other Ambulatory Visit (HOSPITAL_COMMUNITY): Payer: Self-pay | Admitting: Gastroenterology

## 2011-06-15 DIAGNOSIS — R110 Nausea: Secondary | ICD-10-CM

## 2011-06-15 DIAGNOSIS — R140 Abdominal distension (gaseous): Secondary | ICD-10-CM

## 2011-06-25 ENCOUNTER — Encounter (HOSPITAL_COMMUNITY)
Admission: RE | Admit: 2011-06-25 | Discharge: 2011-06-25 | Disposition: A | Discharge status: Home / Self Care | Payer: 59 | Source: Ambulatory Visit | Attending: Gastroenterology | Admitting: Gastroenterology

## 2011-06-25 DIAGNOSIS — R110 Nausea: Secondary | ICD-10-CM

## 2011-06-25 DIAGNOSIS — R143 Flatulence: Secondary | ICD-10-CM | POA: Insufficient documentation

## 2011-06-25 DIAGNOSIS — R142 Eructation: Secondary | ICD-10-CM | POA: Insufficient documentation

## 2011-06-25 DIAGNOSIS — R140 Abdominal distension (gaseous): Secondary | ICD-10-CM

## 2011-06-25 DIAGNOSIS — R141 Gas pain: Secondary | ICD-10-CM | POA: Insufficient documentation

## 2011-06-25 MED ORDER — TECHNETIUM TC 99M SULFUR COLLOID
2.0000 | Freq: Once | INTRAVENOUS | Status: AC | PRN
  Administered 2011-06-25: 2 via ORAL

## 2011-08-20 ENCOUNTER — Ambulatory Visit (INDEPENDENT_AMBULATORY_CARE_PROVIDER_SITE_OTHER): Payer: 59 | Admitting: Pulmonary Disease

## 2011-08-20 ENCOUNTER — Encounter: Payer: Self-pay | Admitting: Pulmonary Disease

## 2011-08-20 VITALS — BP 122/70 | HR 80 | Temp 98.3°F | Ht 65.5 in | Wt 199.5 lb

## 2011-08-20 DIAGNOSIS — G2581 Restless legs syndrome: Secondary | ICD-10-CM

## 2011-08-20 DIAGNOSIS — G4733 Obstructive sleep apnea (adult) (pediatric): Secondary | ICD-10-CM

## 2011-08-20 DIAGNOSIS — G4700 Insomnia, unspecified: Secondary | ICD-10-CM | POA: Insufficient documentation

## 2011-08-20 NOTE — Patient Instructions (Signed)
Will call with results of CPAP settings Follow up in 6 months

## 2011-08-20 NOTE — Progress Notes (Signed)
Chief Complaint  Patient presents with  . Follow-up    Pt states she wears her cpap everynight x 6-8 hrs a night. pt needs new mask and supplies. Pt states her mask is loose now   CC: Cassandra [name], Cassandra [name]  History of Present Illness: Cassandra [name] is a 66 y.o. female with mild OSA on CPAP 6, and RLS  She has been getting episodes of gagging and nausea.  She was concerned this was related to her medicines.  She has stopped several of her medicines, including requip.  She has not noticed too much trouble with her legs.  She is having trouble staying asleep.  She thinks this may be related to her CPAP mask.  She can fall asleep with her mask w/o difficulty, but then wakes up between 3 and 4 am.  She has trouble falling back to sleep with her mask, and will take her mask off.  She had her mask changed a few months ago.   Past Medical History  Diagnosis Date  . Fibromyalgia   . Asthma   . OSA (obstructive sleep apnea)   . Depression   . GERD (gastroesophageal reflux disease)   . Pyuria   . Diverticulosis   . RLS (restless legs syndrome)     Past Surgical History  Procedure Date  . Vaginal hysterectomy   . Breast lumpectomy   . Bladder tacking     Allergies  Allergen Reactions  . Cephalexin     REACTION: unspecified  . Hydrochlorothiazide     REACTION: unspecified  . Metoclopramide Hcl     REACTION: unspecified  . Naproxen Sodium     REACTION: unspecified  . Ranitidine Hcl     REACTION: unspecified  . Sulfonamide Derivatives     REACTION: unspecified    Physical Exam:  Blood pressure 122/70, pulse 80, temperature 98.3 F (36.8 C), temperature source Oral, height 5' 5.5" (1.664 m), weight 199 lb 8 oz (90.493 kg), SpO2 99.00%.  Body mass index is 32.69 kg/(m^2).  Wt Readings from Last 2 Encounters:  08/20/11 199 lb 8 oz (90.493 kg)  02/17/11 204 lb (92.534 kg)    General - Healthy HEENT - no sinus tenderness, no oral exudate, no LAN  Chest - CTA    Cardiac - s1s2 regular, no murmur  Abd - soft, nontender  Ext - no edema  Neuro - normal strength  Assessment/Plan:  Outpatient Encounter Prescriptions as of 08/20/2011  Medication Sig Dispense Refill  . Calcium Carbonate (CALCIUM 500 PO) Once a day       . cetirizine (ZYRTEC) 10 MG tablet Take 10 mg by mouth daily as needed.        Marland Kitchen omeprazole-sodium bicarbonate (ZEGERID) 40-1100 MG per capsule Take 1 capsule by mouth daily.        . ranitidine (ZANTAC) 150 MG capsule Take 150 mg by mouth 2 (two) times daily. Has not started yet      . spironolactone (ALDACTONE) 25 MG tablet Take 25 mg by mouth daily.        . temazepam (RESTORIL) 15 MG capsule Take 15 mg by mouth at bedtime as needed.        Marland Kitchen DISCONTD: albuterol (PROVENTIL HFA;VENTOLIN HFA) 108 (90 BASE) MCG/ACT inhaler Inhale 2 puffs into the lungs every 4 (four) hours as needed.        Marland Kitchen DISCONTD: budesonide-formoterol (SYMBICORT) 160-4.5 MCG/ACT inhaler Inhale 2 puffs into the lungs 2 (two) times daily as needed.       Marland Kitchen  DISCONTD: buPROPion (WELLBUTRIN XL) 300 MG 24 hr tablet Take 300 mg by mouth daily.        Marland Kitchen DISCONTD: CELEBREX 200 MG capsule Once a day      . DISCONTD: cyanocobalamin 100 MCG tablet Take 100 mcg by mouth daily.        Marland Kitchen DISCONTD: estradiol (ESTRACE) 1 MG tablet Take 1 mg by mouth daily.        Marland Kitchen DISCONTD: fluticasone (FLONASE) 50 MCG/ACT nasal spray Place 2 sprays into the nose daily.        Marland Kitchen DISCONTD: gabapentin (NEURONTIN) 100 MG capsule Take 100 mg by mouth 3 (three) times daily.        Marland Kitchen DISCONTD: rOPINIRole (REQUIP) 0.5 MG tablet Three pills at night      . DISCONTD: rOPINIRole (REQUIP) 2 MG tablet Take 1 tablet (2 mg total) by mouth at bedtime.  90 tablet  3    , Pager:  [phone] 08/20/2011, 3:35 PM

## 2011-08-20 NOTE — Assessment & Plan Note (Signed)
She is doing well off medication.  Advised her to call if her leg symptoms get worse.

## 2011-08-20 NOTE — Assessment & Plan Note (Signed)
Discussed regimen about how she could taper off restoril.

## 2011-08-20 NOTE — Assessment & Plan Note (Signed)
She has trouble with her sleep in the middle of the night.  I am not sure this is related to her mask.  Will arrange for auto CPAP titration at home.

## 2011-09-15 ENCOUNTER — Encounter: Payer: Self-pay | Admitting: Pulmonary Disease

## 2011-09-15 ENCOUNTER — Telehealth: Payer: Self-pay | Admitting: Pulmonary Disease

## 2011-09-15 NOTE — Telephone Encounter (Signed)
CPAP 08/27/11 to 09/12/11>>Used on 12 of 17 nights with median 3 hr 55 min.  Average AHI 4 with Median CPAP 6 cm H2O and 95th percentile CPAP 8 cm H2O.  Will have my nurse inform patient that CPAP report showed good control of apnea, but she needs to use for entire time she is asleep to get maximal benefit.

## 2011-09-16 NOTE — Telephone Encounter (Signed)
lmomtcb x1 

## 2011-09-21 NOTE — Telephone Encounter (Signed)
lmomtcb x 2  

## 2011-09-22 NOTE — Telephone Encounter (Signed)
lmomtcb x1 

## 2011-09-22 NOTE — Telephone Encounter (Signed)
Pt returned call. Cassandra [name] °

## 2011-09-23 NOTE — Telephone Encounter (Signed)
Returning Mindy's call can be reached at [phone].Raylene Everts

## 2011-09-24 NOTE — Telephone Encounter (Signed)
I spoke with patient about results and she verbalized understanding and had no questions 

## 2012-07-28 ENCOUNTER — Other Ambulatory Visit: Payer: Self-pay | Admitting: Orthopedic Surgery

## 2012-08-24 ENCOUNTER — Other Ambulatory Visit: Payer: Self-pay | Admitting: Orthopedic Surgery

## 2012-08-24 MED ORDER — DEXAMETHASONE SODIUM PHOSPHATE 10 MG/ML IJ SOLN: 10.0000 mg | Freq: Once | INTRAMUSCULAR | Status: DC

## 2012-08-24 MED ORDER — BUPIVACAINE LIPOSOME 1.3 % IJ SUSP: 20.0000 mL | Freq: Once | INTRAMUSCULAR | Status: DC

## 2012-08-24 NOTE — Progress Notes (Signed)
Preoperative surgical orders have been place into the Epic hospital system for Cassandra [name] on 08/24/2012, 11:33 AM  by Patrica Duel for surgery on 09/12/2012.  Preop Total Knee orders including Experal, IV Tylenol, and IV Decadron as long as there are no contraindications to the above medications. Avel Peace, PA-C

## 2012-08-31 ENCOUNTER — Encounter (HOSPITAL_COMMUNITY): Payer: Self-pay | Admitting: Pharmacy Technician

## 2012-09-06 ENCOUNTER — Encounter (HOSPITAL_COMMUNITY): Payer: Self-pay

## 2012-09-06 ENCOUNTER — Encounter (HOSPITAL_COMMUNITY)
Admission: RE | Admit: 2012-09-06 | Discharge: 2012-09-06 | Disposition: A | Discharge status: Home / Self Care | Payer: 59 | Source: Ambulatory Visit | Attending: Orthopedic Surgery | Admitting: Orthopedic Surgery

## 2012-09-06 DIAGNOSIS — G2581 Restless legs syndrome: Secondary | ICD-10-CM | POA: Diagnosis present

## 2012-09-06 DIAGNOSIS — G4733 Obstructive sleep apnea (adult) (pediatric): Secondary | ICD-10-CM | POA: Diagnosis present

## 2012-09-06 DIAGNOSIS — M25569 Pain in unspecified knee: Secondary | ICD-10-CM | POA: Diagnosis not present

## 2012-09-06 DIAGNOSIS — M1710 Unilateral primary osteoarthritis, unspecified knee: Secondary | ICD-10-CM | POA: Diagnosis not present

## 2012-09-06 DIAGNOSIS — D62 Acute posthemorrhagic anemia: Secondary | ICD-10-CM | POA: Diagnosis not present

## 2012-09-06 DIAGNOSIS — IMO0001 Reserved for inherently not codable concepts without codable children: Secondary | ICD-10-CM | POA: Diagnosis not present

## 2012-09-06 DIAGNOSIS — F329 Major depressive disorder, single episode, unspecified: Secondary | ICD-10-CM | POA: Diagnosis present

## 2012-09-06 DIAGNOSIS — Z01812 Encounter for preprocedural laboratory examination: Secondary | ICD-10-CM | POA: Diagnosis not present

## 2012-09-06 DIAGNOSIS — K219 Gastro-esophageal reflux disease without esophagitis: Secondary | ICD-10-CM | POA: Diagnosis present

## 2012-09-06 DIAGNOSIS — J45909 Unspecified asthma, uncomplicated: Secondary | ICD-10-CM | POA: Diagnosis present

## 2012-09-06 HISTORY — DX: Nausea with vomiting, unspecified: R11.2

## 2012-09-06 HISTORY — DX: Unspecified osteoarthritis, unspecified site: M19.90

## 2012-09-06 HISTORY — DX: Other specified postprocedural states: Z98.890

## 2012-09-06 HISTORY — DX: Diarrhea, unspecified: R19.7

## 2012-09-06 HISTORY — DX: Anemia, unspecified: D64.9

## 2012-09-06 HISTORY — DX: Other seasonal allergic rhinitis: J30.2

## 2012-09-06 LAB — CBC
MCV: 83.8 fL (ref 78.0–100.0)
Platelets: 297 10*3/uL (ref 150–400)
RDW: 14.1 % (ref 11.5–15.5)
WBC: 6.4 10*3/uL (ref 4.0–10.5)

## 2012-09-06 LAB — SURGICAL PCR SCREEN: MRSA, PCR: POSITIVE — AB

## 2012-09-06 LAB — COMPREHENSIVE METABOLIC PANEL
AST: 21 U/L (ref 0–37)
Albumin: 4 g/dL (ref 3.5–5.2)
Chloride: 102 mEq/L (ref 96–112)
Creatinine, Ser: 0.68 mg/dL (ref 0.50–1.10)
Potassium: 4.1 mEq/L (ref 3.5–5.1)
Total Bilirubin: 0.2 mg/dL — ABNORMAL LOW (ref 0.3–1.2)
Total Protein: 7.3 g/dL (ref 6.0–8.3)

## 2012-09-06 LAB — APTT: aPTT: 35 seconds (ref 24–37)

## 2012-09-06 LAB — URINALYSIS, ROUTINE W REFLEX MICROSCOPIC
Ketones, ur: NEGATIVE mg/dL
Nitrite: NEGATIVE
Protein, ur: NEGATIVE mg/dL
Urobilinogen, UA: 0.2 mg/dL (ref 0.0–1.0)
pH: 6 (ref 5.0–8.0)

## 2012-09-06 LAB — ABO/RH: ABO/RH(D): O POS

## 2012-09-06 LAB — PROTIME-INR: INR: 1.02 (ref 0.00–1.49)

## 2012-09-06 LAB — URINE MICROSCOPIC-ADD ON

## 2012-09-06 NOTE — Patient Instructions (Signed)
YOUR SURGERY IS SCHEDULED AT [facility]  ON:  Tuesday  4/15  REPORT TO [place] [facility]ORT STAY [facility] AT:  12:00 PM      PHONE # FOR SHORT STAY IS [phone]  DO NOT EAT ANYTHING AFTER MIDNIGHT THE NIGHT BEFORE YOUR SURGERY.   NO FOOD, NO CHEWING GUM, NO MINTS, NO CANDIES, NO CHEWING TOBACCO. YOU MAY HAVE CLEAR LIQUIDS TO DRINK FROM MIDNIGHT UNTIL 8:30 AM DAY OF SURGERY -- LIKE WATER.   NOTHING TO DRINK AFTER 8:30 AM DAY OF SURGERY.  PLEASE TAKE THE FOLLOWING MEDICATIONS THE AM OF YOUR SURGERY WITH A FEW SIPS OF WATER:  ZYRTEC, ZEGERID, ZANTAC, SERTRALINE.  USE PRO AIR INHALER.  IF YOU USE INHALERS--USE YOUR INHALERS THE AM OF YOUR SURGERY AND BRING INHALERS TO THE HOSPITAL.      IF YOU HAVE SLEEP APNEA AND USE CPAP OR BIPAP--PLEASE BRING THE MASK AND THE TUBING.  DO NOT BRING YOUR MACHINE.  DO NOT BRING VALUABLES, MONEY, CREDIT CARDS.  DO NOT WEAR JEWELRY, MAKE-UP, NAIL POLISH AND NO METAL PINS OR CLIPS IN YOUR HAIR. CONTACT LENS, DENTURES / PARTIALS, GLASSES SHOULD NOT BE WORN TO SURGERY AND IN MOST CASES-HEARING AIDS WILL NEED TO BE REMOVED.  BRING YOUR GLASSES CASE, ANY EQUIPMENT NEEDED FOR YOUR CONTACT LENS. FOR PATIENTS ADMITTED TO THE HOSPITAL--CHECK OUT TIME THE DAY OF DISCHARGE IS 11:00 AM.  ALL INPATIENT ROOMS ARE PRIVATE - WITH BATHROOM, TELEPHONE, TELEVISION AND WIFI INTERNET.                             PLEASE READ OVER ANY  FACT SHEETS THAT YOU WERE GIVEN: MRSA INFORMATION, BLOOD TRANSFUSION INFORMATION, INCENTIVE SPIROMETER INFORMATION. FAILURE TO FOLLOW THESE INSTRUCTIONS MAY RESULT IN THE CANCELLATION OF YOUR SURGERY.   PATIENT SIGNATURE_________________________________

## 2012-09-06 NOTE — H&P (Signed)
TOTAL KNEE ADMISSION H&P  Patient is being admitted for right total knee arthroplasty.  Subjective:  Chief Complaint:right knee pain.  HPI: Cassandra [name], 67 y.o. female, has a history of pain and functional disability in the right knee due to arthritis and has failed non-surgical conservative treatments for greater than 12 weeks to includeNSAID's and/or analgesics, corticosteriod injections, viscosupplementation injections and activity modification.  Onset of symptoms was gradual, starting >10 years ago with gradually worsening course since that time. The patient noted prior procedures on the knee to include  arthroscopy and menisectomy on the right knee(s).  Patient currently rates pain in the right knee(s) at 8 out of 10 with activity. Patient has night pain, worsening of pain with activity and weight bearing, pain that interferes with activities of daily living, pain with passive range of motion, crepitus and joint swelling.  Patient has evidence of periarticular osteophytes and joint space narrowing by imaging studies.  There is no active infection.  Patient Active Problem List   Diagnosis Date Noted  . Insomnia 08/20/2011  . OBSTRUCTIVE SLEEP APNEA 04/23/2008  . RESTLESS LEG SYNDROME 04/23/2008  . FIBROMYALGIA 01/06/2007   Past Medical History  Diagnosis Date  . Fibromyalgia   . Depression   . GERD (gastroesophageal reflux disease)   . Diverticulosis   . RLS (restless legs syndrome)   . Diarrhea     PROBLEMS WITH DIARRHEA  & STOMACH "LOUD NOISES" - NO PAIN --PT WAS PUT ON DRUG CREON- FEELS THAT IT HAS HELPED  . Seasonal allergies   . Asthma     PT STATES HER ASTHMA IS MILD-HAS PRO AIR INHALER TO USE IF NEEDED  . OSA (obstructive sleep apnea)     USES CPAP - SETTING 10  . Arthritis   . Anemia     SOMETIMES ANEMIC  . PONV (postoperative nausea and vomiting)     Past Surgical History  Procedure Laterality Date  . Vaginal hysterectomy    . Bladder tacking    . Breast  lumpectomy      BENIGN  . Abdominal hysterectomy    . Right knee arthroscopy       Current outpatient prescriptions: acetaminophen (TYLENOL) 500 MG tablet, Take 500 mg by mouth at bedtime., Disp: , Rfl: ;   albuterol (PROVENTIL HFA;VENTOLIN HFA) 108 (90 BASE) MCG/ACT inhaler, Inhale 2 puffs into the lungs every 6 (six) hours as needed for wheezing., Disp: , Rfl: ;   cetirizine (ZYRTEC) 10 MG tablet, Take 10 mg by mouth daily. , Disp: , Rfl: ;   Cholecalciferol (VITAMIN D PO), Take 1 tablet by mouth daily., Disp: , Rfl:  Cyanocobalamin (VITAMIN B-12 PO), Take 1 tablet by mouth., Disp: , Rfl: ;   estradiol (MINIVELLE) 0.0375 MG/24HR, Place 1 patch onto the skin 2 (two) times a week. Alternates changing days, Disp: , Rfl: ;   fish oil-omega-3 fatty acids 1000 MG capsule, Take 1 g by mouth daily., Disp: , Rfl: ;   naproxen sodium (ANAPROX) 220 MG tablet, Take 220 mg by mouth 2 (two) times daily with a meal., Disp: , Rfl:  omeprazole-sodium bicarbonate (ZEGERID) 40-1100 MG per capsule, Take 1 capsule by mouth daily. , Disp: , Rfl: ;    Polyethyl Glycol-Propyl Glycol (SYSTANE OP), Place 1 drop into both eyes at bedtime., Disp: , Rfl: ;  ranitidine (ZANTAC) 150 MG capsule, Take 150 mg by mouth 2 (two) times daily. , Disp: , Rfl: ;  sertraline (ZOLOFT) 25 MG tablet, Take 25 mg  by mouth every morning., Disp: , Rfl: ;  sertraline (ZOLOFT) 50 MG tablet, Take 50 mg by mouth daily. TAKES ONE EVERY AM, Disp: , Rfl:  spironolactone (ALDACTONE) 25 MG tablet, Take 25 mg by mouth at bedtime. , Disp: , Rfl: ;    Allergies  Allergen Reactions  . Hydrochlorothiazide     Unknown  . Shellfish Allergy     HIVES  . Sulfonamide Derivatives Hives    History  Substance Use Topics  . Smoking status: Never Smoker   . Smokeless tobacco: Never Used  . Alcohol Use: No    Family History  Problem Relation Age of Onset  . Kidney cancer Mother   . Diabetes Mother      Review of Systems  Constitutional: Negative.    HENT: Negative.  Negative for neck pain.   Eyes: Negative.   Respiratory: Negative.   Cardiovascular: Negative.   Gastrointestinal: Negative.   Genitourinary: Negative.   Musculoskeletal: Negative for myalgias, back pain and falls.       Right knee pain  Skin: Negative.   Neurological: Negative.   Endo/Heme/Allergies: Negative.   Psychiatric/Behavioral: Negative.     Objective:  Physical Exam  Constitutional: She is oriented to person, place, and time. She appears well-developed and well-nourished. No distress.  HENT:  Head: Normocephalic and atraumatic.  Left Ear: External ear normal.  Nose: Nose normal.  Mouth/Throat: Oropharynx is clear and moist.  Eyes: Conjunctivae and EOM are normal.  Neck: Normal range of motion. Neck supple. No tracheal deviation present. No thyromegaly present.  Cardiovascular: Normal rate, regular rhythm, normal heart sounds and intact distal pulses.   No murmur heard. Respiratory: Effort normal and breath sounds normal. No respiratory distress. She has no wheezes. She exhibits no tenderness.  GI: Soft. Bowel sounds are normal. She exhibits no distension and no mass. There is no tenderness.  Musculoskeletal:       Right hip: Normal.       Left hip: Normal.       Right knee: She exhibits decreased range of motion and swelling. She exhibits no effusion and no erythema. Tenderness found. Medial joint line and lateral joint line tenderness noted.       Left knee: She exhibits decreased range of motion and swelling. She exhibits no effusion and no erythema. Tenderness found. Medial joint line and lateral joint line tenderness noted.       Right lower leg: She exhibits no tenderness and no swelling.       Left lower leg: She exhibits no tenderness and no swelling.       Legs: Lymphadenopathy:    She has no cervical adenopathy.  Neurological: She is alert and oriented to person, place, and time. She has normal strength and normal reflexes. No sensory  deficit.  Skin: No rash noted. She is not diaphoretic. No erythema.  Psychiatric: She has a normal mood and affect. Her behavior is normal.    Vital signs in last 24 hours: Temp:  [98.6 F (37 C)] 98.6 F (37 C) (04/09 1415) Pulse Rate:  [73] 73 (04/09 1415) Resp:  [16] 16 (04/09 1415) BP: (127)/(70) 127/70 mmHg (04/09 1415) SpO2:  [97 %] 97 % (04/09 1415) Weight:  [87.998 kg (194 lb)] 87.998 kg (194 lb) (04/09 1415)  Labs:   Estimated body mass index is 32.68 kg/(m^2) as calculated from the following:   Height as of 08/20/11: 5' 5.5" (1.664 m).   Weight as of 08/20/11: 90.493 kg (199 lb  8 oz).   Imaging Review Plain radiographs demonstrate severe degenerative joint disease of the right knee(s). The overall alignment ismild varus. The bone quality appears to be good for age and reported activity level.  Assessment/Plan:  End stage arthritis, right knee   The patient history, physical examination, clinical judgment of the provider and imaging studies are consistent with end stage degenerative joint disease of the right knee(s) and total knee arthroplasty is deemed medically necessary. The treatment options including medical management, injection therapy arthroscopy and arthroplasty were discussed at length. The risks and benefits of total knee arthroplasty were presented and reviewed. The risks due to aseptic loosening, infection, stiffness, patella tracking problems, thromboembolic complications and other imponderables were discussed. The patient acknowledged the explanation, agreed to proceed with the plan and consent was signed. Patient is being admitted for inpatient treatment for surgery, pain control, PT, OT, prophylactic antibiotics, VTE prophylaxis, progressive ambulation and ADL's and discharge planning. The patient is planning to be discharged home with home health services   [place], [place]

## 2012-09-06 NOTE — Pre-Procedure Instructions (Signed)
EKG AND CXR NOT NEEDED PER ANESTHESIOLOGIST'S GUIDELINES. 

## 2012-09-07 LAB — URINE CULTURE
Colony Count: NO GROWTH
Culture: NO GROWTH

## 2012-09-07 NOTE — Pre-Procedure Instructions (Signed)
PT'S PREOP POSITIVE PCR FOR MRSA AND STAPH AUREUS AND UA, URINE MICROSCOPIC AND CULTURE IN PROCESS REPORTS FAXED TO DR. ALUISIO'S OFFICE.

## 2012-09-11 NOTE — Pre-Procedure Instructions (Signed)
URINE CULTURE RESULT -NO GROWTH - REPORT FAXED TO DR. ALUISIO'S OFFICE.

## 2012-09-12 ENCOUNTER — Encounter (HOSPITAL_COMMUNITY): Payer: Self-pay | Admitting: Anesthesiology

## 2012-09-12 ENCOUNTER — Inpatient Hospital Stay (HOSPITAL_COMMUNITY)
Admission: RE | Admit: 2012-09-12 | Discharge: 2012-09-14 | DRG: 470 | Disposition: A | Discharge status: Home Health | Payer: 59 | Source: Ambulatory Visit | Attending: Orthopedic Surgery | Admitting: Orthopedic Surgery

## 2012-09-12 ENCOUNTER — Encounter (HOSPITAL_COMMUNITY): Payer: Self-pay | Admitting: *Deleted

## 2012-09-12 ENCOUNTER — Inpatient Hospital Stay (HOSPITAL_COMMUNITY): Payer: 59 | Admitting: Anesthesiology

## 2012-09-12 ENCOUNTER — Encounter (HOSPITAL_COMMUNITY)
Admission: RE | Disposition: A | Discharge status: Home Health | Payer: Self-pay | Source: Ambulatory Visit | Attending: Orthopedic Surgery

## 2012-09-12 DIAGNOSIS — D62 Acute posthemorrhagic anemia: Secondary | ICD-10-CM | POA: Diagnosis not present

## 2012-09-12 DIAGNOSIS — G2581 Restless legs syndrome: Secondary | ICD-10-CM | POA: Diagnosis present

## 2012-09-12 DIAGNOSIS — K219 Gastro-esophageal reflux disease without esophagitis: Secondary | ICD-10-CM | POA: Diagnosis present

## 2012-09-12 DIAGNOSIS — F3289 Other specified depressive episodes: Secondary | ICD-10-CM | POA: Diagnosis present

## 2012-09-12 DIAGNOSIS — F329 Major depressive disorder, single episode, unspecified: Secondary | ICD-10-CM | POA: Diagnosis present

## 2012-09-12 DIAGNOSIS — Z01812 Encounter for preprocedural laboratory examination: Secondary | ICD-10-CM

## 2012-09-12 DIAGNOSIS — Z96651 Presence of right artificial knee joint: Secondary | ICD-10-CM

## 2012-09-12 DIAGNOSIS — G4733 Obstructive sleep apnea (adult) (pediatric): Secondary | ICD-10-CM | POA: Diagnosis present

## 2012-09-12 DIAGNOSIS — M1710 Unilateral primary osteoarthritis, unspecified knee: Principal | ICD-10-CM | POA: Diagnosis present

## 2012-09-12 DIAGNOSIS — J45909 Unspecified asthma, uncomplicated: Secondary | ICD-10-CM | POA: Diagnosis present

## 2012-09-12 DIAGNOSIS — IMO0001 Reserved for inherently not codable concepts without codable children: Secondary | ICD-10-CM | POA: Diagnosis present

## 2012-09-12 HISTORY — PX: TOTAL KNEE ARTHROPLASTY: SHX125

## 2012-09-12 LAB — TYPE AND SCREEN: Antibody Screen: NEGATIVE

## 2012-09-12 SURGERY — ARTHROPLASTY, KNEE, TOTAL
Anesthesia: Spinal | Site: Knee | Laterality: Right | Wound class: Clean

## 2012-09-12 MED ORDER — LACTATED RINGERS IV SOLN
INTRAVENOUS | Status: DC
  Administered 2012-09-12: 16:00:00 via INTRAVENOUS
  Administered 2012-09-12: 1000 mL via INTRAVENOUS

## 2012-09-12 MED ORDER — VANCOMYCIN HCL IN DEXTROSE 1-5 GM/200ML-% IV SOLN
1000.0000 mg | Freq: Two times a day (BID) | INTRAVENOUS | Status: AC
  Administered 2012-09-13: 1000 mg via INTRAVENOUS
  Filled 2012-09-12: qty 200

## 2012-09-12 MED ORDER — BUPIVACAINE HCL (PF) 0.75 % IJ SOLN
INTRAMUSCULAR | Status: DC | PRN
  Administered 2012-09-12: 13.5 mg

## 2012-09-12 MED ORDER — SODIUM CHLORIDE 0.9 % IV SOLN: INTRAVENOUS | Status: DC

## 2012-09-12 MED ORDER — SERTRALINE HCL 50 MG PO TABS: 50.0000 mg | ORAL_TABLET | Freq: Every day | ORAL | Status: DC

## 2012-09-12 MED ORDER — ACETAMINOPHEN 325 MG PO TABS: 650.0000 mg | ORAL_TABLET | Freq: Four times a day (QID) | ORAL | Status: DC | PRN

## 2012-09-12 MED ORDER — MENTHOL 3 MG MT LOZG: 1.0000 | LOZENGE | OROMUCOSAL | Status: DC | PRN

## 2012-09-12 MED ORDER — PROPOFOL INFUSION 10 MG/ML OPTIME
INTRAVENOUS | Status: DC | PRN
  Administered 2012-09-12: 75 ug/kg/min via INTRAVENOUS

## 2012-09-12 MED ORDER — RIVAROXABAN 10 MG PO TABS
10.0000 mg | ORAL_TABLET | Freq: Every day | ORAL | Status: DC
  Administered 2012-09-13 – 2012-09-14 (×2): 10 mg via ORAL
  Filled 2012-09-12 (×3): qty 1

## 2012-09-12 MED ORDER — METHOCARBAMOL 100 MG/ML IJ SOLN: 500.0000 mg | Freq: Four times a day (QID) | INTRAVENOUS | Status: DC | PRN

## 2012-09-12 MED ORDER — SODIUM CHLORIDE 0.9 % IJ SOLN: Freq: Once | INTRAMUSCULAR | Status: DC

## 2012-09-12 MED ORDER — MORPHINE SULFATE 2 MG/ML IJ SOLN: 1.0000 mg | INTRAMUSCULAR | Status: DC | PRN

## 2012-09-12 MED ORDER — CHLORHEXIDINE GLUCONATE 4 % EX LIQD
60.0000 mL | Freq: Once | CUTANEOUS | Status: DC
  Filled 2012-09-12: qty 60

## 2012-09-12 MED ORDER — BISACODYL 10 MG RE SUPP: 10.0000 mg | Freq: Every day | RECTAL | Status: DC | PRN

## 2012-09-12 MED ORDER — FENTANYL CITRATE 0.05 MG/ML IJ SOLN
INTRAMUSCULAR | Status: DC | PRN
  Administered 2012-09-12 (×4): 25 ug via INTRAVENOUS

## 2012-09-12 MED ORDER — PHENOL 1.4 % MT LIQD: 1.0000 | OROMUCOSAL | Status: DC | PRN

## 2012-09-12 MED ORDER — DEXAMETHASONE 6 MG PO TABS
10.0000 mg | ORAL_TABLET | Freq: Once | ORAL | Status: AC
  Administered 2012-09-13: 10 mg via ORAL
  Filled 2012-09-12: qty 1

## 2012-09-12 MED ORDER — CEFAZOLIN SODIUM-DEXTROSE 2-3 GM-% IV SOLR
INTRAVENOUS | Status: DC | PRN
  Administered 2012-09-12: 2 g via INTRAVENOUS

## 2012-09-12 MED ORDER — TRAMADOL HCL 50 MG PO TABS
50.0000 mg | ORAL_TABLET | Freq: Four times a day (QID) | ORAL | Status: DC | PRN
  Administered 2012-09-13 – 2012-09-14 (×4): 100 mg via ORAL
  Filled 2012-09-12 (×4): qty 2

## 2012-09-12 MED ORDER — SODIUM CHLORIDE 0.9 % IJ SOLN
INTRAMUSCULAR | Status: DC | PRN
  Administered 2012-09-12: 50 mL via INTRAVENOUS

## 2012-09-12 MED ORDER — POLYETHYLENE GLYCOL 3350 17 G PO PACK: 17.0000 g | PACK | Freq: Every day | ORAL | Status: DC | PRN

## 2012-09-12 MED ORDER — ACETAMINOPHEN 10 MG/ML IV SOLN: 1000.0000 mg | Freq: Once | INTRAVENOUS | Status: DC

## 2012-09-12 MED ORDER — OXYCODONE HCL 5 MG PO TABS: 5.0000 mg | ORAL_TABLET | Freq: Once | ORAL | Status: DC | PRN

## 2012-09-12 MED ORDER — BUPIVACAINE LIPOSOME 1.3 % IJ SUSP
20.0000 mL | Freq: Once | INTRAMUSCULAR | Status: DC
  Filled 2012-09-12: qty 20

## 2012-09-12 MED ORDER — DIPHENHYDRAMINE HCL 12.5 MG/5ML PO ELIX: 12.5000 mg | ORAL_SOLUTION | ORAL | Status: DC | PRN

## 2012-09-12 MED ORDER — FENTANYL CITRATE 0.05 MG/ML IJ SOLN
25.0000 ug | INTRAMUSCULAR | Status: DC | PRN
  Administered 2012-09-12: 50 ug via INTRAVENOUS

## 2012-09-12 MED ORDER — ALBUTEROL SULFATE HFA 108 (90 BASE) MCG/ACT IN AERS
2.0000 | INHALATION_SPRAY | Freq: Four times a day (QID) | RESPIRATORY_TRACT | Status: DC | PRN
  Filled 2012-09-12: qty 6.7

## 2012-09-12 MED ORDER — ONDANSETRON HCL 4 MG/2ML IJ SOLN: 4.0000 mg | Freq: Four times a day (QID) | INTRAMUSCULAR | Status: DC | PRN

## 2012-09-12 MED ORDER — DEXAMETHASONE SODIUM PHOSPHATE 10 MG/ML IJ SOLN: 10.0000 mg | Freq: Once | INTRAMUSCULAR | Status: AC

## 2012-09-12 MED ORDER — ONDANSETRON HCL 4 MG PO TABS: 4.0000 mg | ORAL_TABLET | Freq: Four times a day (QID) | ORAL | Status: DC | PRN

## 2012-09-12 MED ORDER — PANCRELIPASE (LIP-PROT-AMYL) 12000-38000 UNITS PO CPEP
2.0000 | ORAL_CAPSULE | Freq: Three times a day (TID) | ORAL | Status: DC
  Administered 2012-09-13 – 2012-09-14 (×5): 2 via ORAL
  Filled 2012-09-12 (×7): qty 2

## 2012-09-12 MED ORDER — DEXTROSE-NACL 5-0.9 % IV SOLN
INTRAVENOUS | Status: DC
  Administered 2012-09-12 – 2012-09-14 (×2): via INTRAVENOUS

## 2012-09-12 MED ORDER — DOCUSATE SODIUM 100 MG PO CAPS
100.0000 mg | ORAL_CAPSULE | Freq: Two times a day (BID) | ORAL | Status: DC
  Administered 2012-09-12 – 2012-09-14 (×4): 100 mg via ORAL

## 2012-09-12 MED ORDER — MIDAZOLAM HCL 2 MG/2ML IJ SOLN
0.5000 mg | Freq: Once | INTRAMUSCULAR | Status: AC | PRN
  Administered 2012-09-12: 1 mg via INTRAVENOUS

## 2012-09-12 MED ORDER — METOCLOPRAMIDE HCL 10 MG PO TABS: 5.0000 mg | ORAL_TABLET | Freq: Three times a day (TID) | ORAL | Status: DC | PRN

## 2012-09-12 MED ORDER — HYDROMORPHONE HCL PF 1 MG/ML IJ SOLN
0.2500 mg | INTRAMUSCULAR | Status: DC | PRN
  Administered 2012-09-12 (×4): 0.5 mg via INTRAVENOUS

## 2012-09-12 MED ORDER — VANCOMYCIN HCL 10 G IV SOLR
1500.0000 mg | INTRAVENOUS | Status: AC
  Administered 2012-09-12: 1500 mg via INTRAVENOUS
  Filled 2012-09-12: qty 1500

## 2012-09-12 MED ORDER — PROMETHAZINE HCL 25 MG/ML IJ SOLN: 6.2500 mg | INTRAMUSCULAR | Status: DC | PRN

## 2012-09-12 MED ORDER — SERTRALINE HCL 25 MG PO TABS: 75.0000 mg | ORAL_TABLET | Freq: Every morning | ORAL | Status: DC

## 2012-09-12 MED ORDER — MIDAZOLAM HCL 5 MG/5ML IJ SOLN
INTRAMUSCULAR | Status: DC | PRN
  Administered 2012-09-12: 2 mg via INTRAVENOUS

## 2012-09-12 MED ORDER — PANTOPRAZOLE SODIUM 40 MG PO TBEC
80.0000 mg | DELAYED_RELEASE_TABLET | Freq: Every day | ORAL | Status: DC
  Filled 2012-09-12: qty 2

## 2012-09-12 MED ORDER — LORATADINE 10 MG PO TABS
10.0000 mg | ORAL_TABLET | Freq: Every day | ORAL | Status: DC
  Administered 2012-09-13 – 2012-09-14 (×2): 10 mg via ORAL
  Filled 2012-09-12 (×2): qty 1

## 2012-09-12 MED ORDER — ACETAMINOPHEN 10 MG/ML IV SOLN: 1000.0000 mg | Freq: Once | INTRAVENOUS | Status: DC | PRN

## 2012-09-12 MED ORDER — OXYCODONE HCL 5 MG/5ML PO SOLN
5.0000 mg | Freq: Once | ORAL | Status: DC | PRN
  Filled 2012-09-12: qty 5

## 2012-09-12 MED ORDER — METHOCARBAMOL 500 MG PO TABS
500.0000 mg | ORAL_TABLET | Freq: Four times a day (QID) | ORAL | Status: DC | PRN
  Administered 2012-09-12 – 2012-09-14 (×4): 500 mg via ORAL
  Filled 2012-09-12 (×5): qty 1

## 2012-09-12 MED ORDER — TEMAZEPAM 15 MG PO CAPS
30.0000 mg | ORAL_CAPSULE | Freq: Every day | ORAL | Status: DC
  Administered 2012-09-12 – 2012-09-13 (×2): 30 mg via ORAL
  Filled 2012-09-12 (×2): qty 2

## 2012-09-12 MED ORDER — ACETAMINOPHEN 10 MG/ML IV SOLN
1000.0000 mg | Freq: Four times a day (QID) | INTRAVENOUS | Status: AC
Start: 2012-09-12 — End: 2012-09-13
  Administered 2012-09-12 – 2012-09-13 (×4): 1000 mg via INTRAVENOUS
  Filled 2012-09-12 (×7): qty 100

## 2012-09-12 MED ORDER — FAMOTIDINE 20 MG PO TABS
20.0000 mg | ORAL_TABLET | Freq: Two times a day (BID) | ORAL | Status: DC
  Administered 2012-09-12 – 2012-09-14 (×4): 20 mg via ORAL
  Filled 2012-09-12 (×5): qty 1

## 2012-09-12 MED ORDER — SPIRONOLACTONE 25 MG PO TABS
25.0000 mg | ORAL_TABLET | Freq: Every day | ORAL | Status: DC
  Administered 2012-09-12 – 2012-09-13 (×2): 25 mg via ORAL
  Filled 2012-09-12 (×3): qty 1

## 2012-09-12 MED ORDER — MEPERIDINE HCL 50 MG/ML IJ SOLN: 6.2500 mg | INTRAMUSCULAR | Status: DC | PRN

## 2012-09-12 MED ORDER — BUPIVACAINE LIPOSOME 1.3 % IJ SUSP
INTRAMUSCULAR | Status: DC | PRN
  Administered 2012-09-12: 20 mL

## 2012-09-12 MED ORDER — SERTRALINE HCL 50 MG PO TABS
50.0000 mg | ORAL_TABLET | Freq: Every morning | ORAL | Status: DC
  Administered 2012-09-13 – 2012-09-14 (×2): 50 mg via ORAL
  Filled 2012-09-12 (×3): qty 1

## 2012-09-12 MED ORDER — ACETAMINOPHEN 650 MG RE SUPP: 650.0000 mg | Freq: Four times a day (QID) | RECTAL | Status: DC | PRN

## 2012-09-12 MED ORDER — FLEET ENEMA 7-19 GM/118ML RE ENEM: 1.0000 | ENEMA | Freq: Once | RECTAL | Status: AC | PRN

## 2012-09-12 MED ORDER — METOCLOPRAMIDE HCL 5 MG/ML IJ SOLN: 5.0000 mg | Freq: Three times a day (TID) | INTRAMUSCULAR | Status: DC | PRN

## 2012-09-12 MED ORDER — OXYCODONE HCL 5 MG PO TABS
5.0000 mg | ORAL_TABLET | ORAL | Status: DC | PRN
  Administered 2012-09-12 – 2012-09-14 (×8): 10 mg via ORAL
  Filled 2012-09-12 (×8): qty 2

## 2012-09-12 MED ORDER — PHENYLEPHRINE HCL 10 MG/ML IJ SOLN
INTRAMUSCULAR | Status: DC | PRN
  Administered 2012-09-12 (×4): 40 ug via INTRAVENOUS

## 2012-09-12 SURGICAL SUPPLY — 55 items
BAG SPEC THK2 15X12 ZIP CLS (MISCELLANEOUS) ×1
BAG ZIPLOCK 12X15 (MISCELLANEOUS) ×2 IMPLANT
BANDAGE ELASTIC 6 VELCRO ST LF (GAUZE/BANDAGES/DRESSINGS) ×2 IMPLANT
BANDAGE ESMARK 6X9 LF (GAUZE/BANDAGES/DRESSINGS) ×1 IMPLANT
BLADE SAG 18X100X1.27 (BLADE) ×2 IMPLANT
BLADE SAW SGTL 11.0X1.19X90.0M (BLADE) ×2 IMPLANT
BNDG CMPR 9X6 STRL LF SNTH (GAUZE/BANDAGES/DRESSINGS) ×1
BNDG ESMARK 6X9 LF (GAUZE/BANDAGES/DRESSINGS) ×2
BOWL SMART MIX CTS (DISPOSABLE) ×2 IMPLANT
CEMENT HV SMART SET (Cement) ×4 IMPLANT
CLOTH BEACON ORANGE TIMEOUT ST (SAFETY) ×2 IMPLANT
CUFF TOURN SGL QUICK 34 (TOURNIQUET CUFF) ×2
CUFF TRNQT CYL 34X4X40X1 (TOURNIQUET CUFF) ×1 IMPLANT
DRAPE EXTREMITY T 121X128X90 (DRAPE) ×2 IMPLANT
DRAPE POUCH INSTRU U-SHP 10X18 (DRAPES) ×2 IMPLANT
DRAPE U-SHAPE 47X51 STRL (DRAPES) ×2 IMPLANT
DRSG ADAPTIC 3X8 NADH LF (GAUZE/BANDAGES/DRESSINGS) ×2 IMPLANT
DRSG PAD ABDOMINAL 8X10 ST (GAUZE/BANDAGES/DRESSINGS) ×1 IMPLANT
DURAPREP 26ML APPLICATOR (WOUND CARE) ×2 IMPLANT
ELECT REM PT RETURN 9FT ADLT (ELECTROSURGICAL) ×2
ELECTRODE REM PT RTRN 9FT ADLT (ELECTROSURGICAL) ×1 IMPLANT
EVACUATOR 1/8 PVC DRAIN (DRAIN) ×2 IMPLANT
FACESHIELD LNG OPTICON STERILE (SAFETY) ×10 IMPLANT
GLOVE BIO SURGEON STRL SZ7.5 (GLOVE) ×2 IMPLANT
GLOVE BIO SURGEON STRL SZ8 (GLOVE) ×2 IMPLANT
GLOVE BIOGEL PI IND STRL 8 (GLOVE) ×2 IMPLANT
GLOVE BIOGEL PI INDICATOR 8 (GLOVE) ×2
GLOVE SURG SS PI 6.5 STRL IVOR (GLOVE) ×4 IMPLANT
GOWN STRL NON-REIN LRG LVL3 (GOWN DISPOSABLE) ×4 IMPLANT
GOWN STRL REIN XL XLG (GOWN DISPOSABLE) ×2 IMPLANT
HANDPIECE INTERPULSE COAX TIP (DISPOSABLE) ×2
IMMOBILIZER KNEE 20 (SOFTGOODS) ×2
IMMOBILIZER KNEE 20 THIGH 36 (SOFTGOODS) ×1 IMPLANT
KIT BASIN OR (CUSTOM PROCEDURE TRAY) ×2 IMPLANT
MANIFOLD NEPTUNE II (INSTRUMENTS) ×2 IMPLANT
NDL SAFETY ECLIPSE 18X1.5 (NEEDLE) ×1 IMPLANT
NEEDLE HYPO 18GX1.5 SHARP (NEEDLE) ×2
NS IRRIG 1000ML POUR BTL (IV SOLUTION) ×2 IMPLANT
PACK TOTAL JOINT (CUSTOM PROCEDURE TRAY) ×2 IMPLANT
PAD ABD 7.5X8 STRL (GAUZE/BANDAGES/DRESSINGS) ×2 IMPLANT
PADDING CAST COTTON 6X4 STRL (CAST SUPPLIES) ×6 IMPLANT
POSITIONER SURGICAL ARM (MISCELLANEOUS) ×2 IMPLANT
SET HNDPC FAN SPRY TIP SCT (DISPOSABLE) ×1 IMPLANT
SPONGE GAUZE 4X4 12PLY (GAUZE/BANDAGES/DRESSINGS) ×2 IMPLANT
STRIP CLOSURE SKIN 1/2X4 (GAUZE/BANDAGES/DRESSINGS) ×5 IMPLANT
SUCTION FRAZIER 12FR DISP (SUCTIONS) ×2 IMPLANT
SUT MNCRL AB 4-0 PS2 18 (SUTURE) ×2 IMPLANT
SUT VIC AB 2-0 CT1 27 (SUTURE) ×6
SUT VIC AB 2-0 CT1 TAPERPNT 27 (SUTURE) ×3 IMPLANT
SUT VLOC 180 0 24IN GS25 (SUTURE) ×2 IMPLANT
SYR 50ML LL SCALE MARK (SYRINGE) ×2 IMPLANT
TOWEL OR 17X26 10 PK STRL BLUE (TOWEL DISPOSABLE) ×4 IMPLANT
TRAY FOLEY CATH 14FRSI W/METER (CATHETERS) ×2 IMPLANT
WATER STERILE IRR 1500ML POUR (IV SOLUTION) ×2 IMPLANT
WRAP KNEE MAXI GEL POST OP (GAUZE/BANDAGES/DRESSINGS) ×4 IMPLANT

## 2012-09-12 NOTE — Transfer of Care (Signed)
Immediate Anesthesia Transfer of Care Note  Patient: Cassandra [name]  Procedure(s) Performed: Procedure(s) (LRB): RIGHT TOTAL KNEE ARTHROPLASTY (Right)  Patient Location: PACU  Anesthesia Type: Spinal  Level of Consciousness: sedated, patient cooperative and responds to stimulaton  Airway & Oxygen Therapy: Patient Spontanous Breathing and Patient connected to face mask oxgen  Post-op Assessment: Report given to PACU RN and Post -op Vital signs reviewed and stable  Post vital signs: Reviewed and stable Noted T12 level on exam denied pain on assessement released stable condition.   Complications: No apparent anesthesia complications

## 2012-09-12 NOTE — Anesthesia Preprocedure Evaluation (Addendum)
Anesthesia Evaluation  Patient identified by MRN, date of birth, ID band Patient awake    Reviewed: Allergy & Precautions, H&P , NPO status , Patient's Chart, lab work & pertinent test results  History of Anesthesia Complications (+) PONV  Airway Mallampati: II TM Distance: >3 FB Neck ROM: Full    Dental  (+) Dental Advisory Given and Teeth Intact   Pulmonary asthma , sleep apnea ,  breath sounds clear to auscultation        Cardiovascular negative cardio ROS  Rhythm:Regular Rate:Normal     Neuro/Psych PSYCHIATRIC DISORDERS Depression negative neurological ROS     GI/Hepatic negative GI ROS, Neg liver ROS, GERD-  ,  Endo/Other  negative endocrine ROS  Renal/GU negative Renal ROS     Musculoskeletal  (+) Fibromyalgia -  Abdominal   Peds  Hematology  (+) Blood dyscrasia, anemia ,   Anesthesia Other Findings   Reproductive/Obstetrics negative OB ROS                          Anesthesia Physical Anesthesia Plan  ASA: II  Anesthesia Plan:    Post-op Pain Management:    Induction: Intravenous  Airway Management Planned:   Additional Equipment:   Intra-op Plan:   Post-operative Plan:   Informed Consent: I have reviewed the patients History and Physical, chart, labs and discussed the procedure including the risks, benefits and alternatives for the proposed anesthesia with the patient or authorized representative who has indicated his/her understanding and acceptance.   Dental advisory given  Plan Discussed with: CRNA  Anesthesia Plan Comments:         Anesthesia Quick Evaluation

## 2012-09-12 NOTE — Anesthesia Postprocedure Evaluation (Signed)
  Anesthesia Post-op Note  Patient: Cassandra [name]  Procedure(s) Performed: Procedure(s) (LRB): RIGHT TOTAL KNEE ARTHROPLASTY (Right)  Patient Location: PACU  Anesthesia Type: Spinal  Level of Consciousness: awake and alert   Airway and Oxygen Therapy: Patient Spontanous Breathing  Post-op Pain: mild  Post-op Assessment: Post-op Vital signs reviewed, Patient's Cardiovascular Status Stable, Respiratory Function Stable, Patent Airway and No signs of Nausea or vomiting  Last Vitals:  Filed Vitals:   09/12/12 1730  BP: 114/55  Pulse: 74  Temp:   Resp: 17    Post-op Vital Signs: stable   Complications: No apparent anesthesia complications 

## 2012-09-12 NOTE — Anesthesia Postprocedure Evaluation (Signed)
  Anesthesia Post-op Note  Patient: Cassandra [name]  Procedure(s) Performed: Procedure(s) (LRB): RIGHT TOTAL KNEE ARTHROPLASTY (Right)  Patient Location: PACU  Anesthesia Type: Spinal  Level of Consciousness: awake and alert   Airway and Oxygen Therapy: Patient Spontanous Breathing  Post-op Pain: mild  Post-op Assessment: Post-op Vital signs reviewed, Patient's Cardiovascular Status Stable, Respiratory Function Stable, Patent Airway and No signs of Nausea or vomiting  Last Vitals:  Filed Vitals:   09/12/12 1730  BP: 114/55  Pulse: 74  Temp:   Resp: 17    Post-op Vital Signs: stable   Complications: No apparent anesthesia complications

## 2012-09-12 NOTE — Op Note (Signed)
Pre-operative diagnosis- Osteoarthritis  Right knee(s)  Post-operative diagnosis- Osteoarthritis Right knee(s)  Procedure-  Right  Total Knee Arthroplasty  Surgeon- Gus Rankin. , MD  Assistant- Avel Peace, Pa-C   Anesthesia-  Spinal EBL-* No blood loss amount entered *  Drains Hemovac  Tourniquet time-  Total Tourniquet Time Documented: Thigh (Right) - 39 minutes Total: Thigh (Right) - 39 minutes    Complications- None  Condition-PACU - hemodynamically stable.   Brief Clinical Note  Cassandra [name] is a 67 y.o. year old female with end stage OA of her right knee with progressively worsening pain and dysfunction. She has constant pain, with activity and at rest and significant functional deficits with difficulties even with ADLs. She has had extensive non-op management including analgesics, injections of cortisone and viscosupplements, and home exercise program, but remains in significant pain with significant dysfunction.Radiographs show bone on bone arthritis medial and patellofemoral. She presents now for right Total Knee Arthroplasty.    Procedure in detail---   The patient is brought into the operating room and positioned supine on the operating table. After successful administration of  Spinal,   a tourniquet is placed high on the  Right thigh(s) and the lower extremity is prepped and draped in the usual sterile fashion. Time out is performed by the operating team and then the  Right lower extremity is wrapped in Esmarch, knee flexed and the tourniquet inflated to 300 mmHg.       A midline incision is made with a ten blade through the subcutaneous tissue to the level of the extensor mechanism. A fresh blade is used to make a medial parapatellar arthrotomy. Soft tissue over the proximal medial tibia is subperiosteally elevated to the joint line with a knife and into the semimembranosus bursa with a Cobb elevator. Soft tissue over the proximal lateral tibia is elevated with  attention being paid to avoiding the patellar tendon on the tibial tubercle. The patella is everted, knee flexed 90 degrees and the ACL and PCL are removed. Findings are bone on bone medial and patellofemoral with large medial osteophytes.        The drill is used to create a starting hole in the distal femur and the canal is thoroughly irrigated with sterile saline to remove the fatty contents. The 5 degree Right  valgus alignment guide is placed into the femoral canal and the distal femoral cutting block is pinned to remove 10 mm off the distal femur. Resection is made with an oscillating saw.      The tibia is subluxed forward and the menisci are removed. The extramedullary alignment guide is placed referencing proximally at the medial aspect of the tibial tubercle and distally along the second metatarsal axis and tibial crest. The block is pinned to remove 2mm off the more deficient medial  side. Resection is made with an oscillating saw. Size 3is the most appropriate size for the tibia and the proximal tibia is prepared with the modular drill and keel punch for that size.      The femoral sizing guide is placed and size 3 is most appropriate. Rotation is marked off the epicondylar axis and confirmed by creating a rectangular flexion gap at 90 degrees. The size 3 cutting block is pinned in this rotation and the anterior, posterior and chamfer cuts are made with the oscillating saw. The intercondylar block is then placed and that cut is made.      Trial size 3 tibial component, trial size 3 posterior  stabilized femur and a 10  mm posterior stabilized rotating platform insert trial is placed. Full extension is achieved with excellent varus/valgus and anterior/posterior balance throughout full range of motion. The patella is everted and thickness measured to be 21  mm. Free hand resection is taken to 12 mm, a 35 template is placed, lug holes are drilled, trial patella is placed, and it tracks normally.  Osteophytes are removed off the posterior femur with the trial in place. All trials are removed and the cut bone surfaces prepared with pulsatile lavage. Cement is mixed and once ready for implantation, the size 3 tibial implant, size  3 posterior stabilized femoral component, and the size 35 patella are cemented in place and the patella is held with the clamp. The trial insert is placed and the knee held in full extension. The Exparel (20 ml mixed with 50 ml saline) is injected into the extensor mechanism, posterior capsule, medial and lateral gutters and subcutaneous tissues.  All extruded cement is removed and once the cement is hard the permanent 10 mm posterior stabilized rotating platform insert is placed into the tibial tray.      The wound is copiously irrigated with saline solution and the extensor mechanism closed over a hemovac drain with #1 PDS suture. The tourniquet is released for a total tourniquet time of 39  minutes. Flexion against gravity is 140 degrees and the patella tracks normally. Subcutaneous tissue is closed with 2.0 vicryl and subcuticular with running 4.0 Monocryl. The incision is cleaned and dried and steri-strips and a bulky sterile dressing are applied. The limb is placed into a knee immobilizer and the patient is awakened and transported to recovery in stable condition.      Please note that a surgical assistant was a medical necessity for this procedure in order to perform it in a safe and expeditious manner. Surgical assistant was necessary to retract the ligaments and vital neurovascular structures to prevent injury to them and also necessary for proper positioning of the limb to allow for anatomic placement of the prosthesis.   Gus Rankin , MD    09/12/2012, 4:45 PM

## 2012-09-12 NOTE — Interval H&P Note (Signed)
History and Physical Interval Note:  09/12/2012 2:47 PM  Cassandra [name]  has presented today for surgery, with the diagnosis of OA OF RIGHT KNEE  The various methods of treatment have been discussed with the patient and family. After consideration of risks, benefits and other options for treatment, the patient has consented to  Procedure(s): RIGHT TOTAL KNEE ARTHROPLASTY (Right) as a surgical intervention .  The patient's history has been reviewed, patient examined, no change in status, stable for surgery.  I have reviewed the patient's chart and labs.  Questions were answered to the patient's satisfaction.     Loanne Drilling

## 2012-09-12 NOTE — Anesthesia Procedure Notes (Signed)
Spinal  Patient location during procedure: OR Start time: 09/12/2012 1:40 PM End time: 09/12/2012 1:45 PM Staffing CRNA/Resident: Paris Lore Performed by: anesthesiologist  Preanesthetic Checklist Completed: patient identified, site marked, surgical consent, pre-op evaluation, timeout performed, IV checked, risks and benefits discussed and monitors and equipment checked Spinal Block Patient position: sitting Prep: Betadine Patient monitoring: heart rate, continuous pulse ox and blood pressure Approach: right paramedian Location: L3-4 Injection technique: single-shot Needle Needle type: Spinocan  Needle gauge: 22 G Needle length: 9 cm Needle insertion depth: 5 cm Assessment Sensory level: T6 Additional Notes Expiration date of kit checked and confirmed. Patient tolerated procedure well, without complications. X 1 attempt with noted clear CSF return and easy aspiration of CSF medication administer noted T6 level on exam.

## 2012-09-13 ENCOUNTER — Encounter (HOSPITAL_COMMUNITY): Payer: Self-pay | Admitting: Orthopedic Surgery

## 2012-09-13 LAB — CBC
MCV: 84 fL (ref 78.0–100.0)
Platelets: 199 10*3/uL (ref 150–400)
RDW: 14.2 % (ref 11.5–15.5)
WBC: 7.6 10*3/uL (ref 4.0–10.5)

## 2012-09-13 LAB — BASIC METABOLIC PANEL
Calcium: 8.4 mg/dL (ref 8.4–10.5)
Chloride: 102 mEq/L (ref 96–112)
Creatinine, Ser: 0.57 mg/dL (ref 0.50–1.10)
GFR calc Af Amer: >90 mL/min (ref >90)
Sodium: 135 mEq/L (ref 135–145)

## 2012-09-13 MED ORDER — SODIUM CHLORIDE 0.9 % IV BOLUS (SEPSIS)
250.0000 mL | Freq: Once | INTRAVENOUS | Status: AC
  Administered 2012-09-13: 250 mL via INTRAVENOUS

## 2012-09-13 MED ORDER — OMEPRAZOLE 20 MG PO CPDR
40.0000 mg | DELAYED_RELEASE_CAPSULE | Freq: Every day | ORAL | Status: DC
  Administered 2012-09-13 – 2012-09-14 (×2): 40 mg via ORAL
  Filled 2012-09-13 (×3): qty 2

## 2012-09-13 MED ORDER — NON FORMULARY: 40.0000 mg | Freq: Every day | Status: DC

## 2012-09-13 NOTE — Evaluation (Signed)
Physical Therapy Evaluation Patient Details Name: Cassandra [name] MRN: [id number] DOB: May 03, 1946 Today's Date: 09/13/2012 Time: 1205-1238 PT Time Calculation (min): 33 min  PT Assessment / Plan / Recommendation Clinical Impression  pt si s/p RTKA POD #1 today; Will benefit form PT to improve independence for D/C to daughter's.    PT Assessment  Patient needs continued PT services    Follow Up Recommendations  Home health PT    Does the patient have the potential to tolerate intense rehabilitation      Barriers to Discharge        Equipment Recommendations  None recommended by PT    Recommendations for Other Services     Frequency 7X/week    Precautions / Restrictions Precautions Precautions: Knee Required Braces or Orthoses: Knee Immobilizer - Right Restrictions RLE Weight Bearing: Weight bearing as tolerated   Pertinent Vitals/Pain 4-5/10 with activity R knee      Mobility  Bed Mobility Bed Mobility: Supine to Sit Supine to Sit: 4: Min assist Details for Bed Mobility Assistance: cues for technique Transfers Transfers: Sit to Stand;Stand to Sit Sit to Stand: 4: Min assist;From bed;From chair/3-in-1 Stand to Sit: To chair/3-in-1;To bed;4: Min assist Details for Transfer Assistance: cues for hands and RLE management Ambulation/Gait Ambulation/Gait Assistance: 4: Min assist Ambulation Distance (Feet): 20 Feet (and 10' to bathroom) Assistive device: Rolling walker Gait Pattern: Step-to pattern;Antalgic Gait velocity: decreased    Exercises Total Joint Exercises Ankle Circles/Pumps: AROM;Both;10 reps Quad Sets: AROM;Both;10 reps   PT Diagnosis: Difficulty walking  PT Problem List: Decreased strength;Decreased range of motion;Decreased activity tolerance;Decreased mobility;Decreased knowledge of use of DME;Decreased knowledge of precautions PT Treatment Interventions: DME instruction;Gait training;Functional mobility training;Therapeutic activities;Therapeutic  exercise;Patient/family education;Balance training;Stair training   PT Goals Acute Rehab PT Goals PT Goal Formulation: With patient Time For Goal Achievement: 09/13/12 Potential to Achieve Goals: Good Pt will go Supine/Side to Sit: with supervision PT Goal: Supine/Side to Sit - Progress: Goal set today Pt will go Sit to Stand: with supervision PT Goal: Sit to Stand - Progress: Goal set today Pt will Ambulate: 51 - 150 feet;with supervision;with rolling walker PT Goal: Ambulate - Progress: Goal set today Pt will Go Up / Down Stairs: 1-2 stairs;with min assist PT Goal: Up/Down Stairs - Progress: Goal set today Pt will Perform Home Exercise Program: with supervision, verbal cues required/provided PT Goal: Perform Home Exercise Program - Progress: Goal set today  Visit Information  Last PT Received On: 09/13/12 Assistance Needed: +1    Subjective Data  Subjective: can i pee? Patient Stated Goal: to daughter's   Prior Functioning  Home Living Lives With: Alone Available Help at Discharge: Family Home Access: Stairs to enter Secretary/administrator of Steps: 2 Entrance Stairs-Rails: None Home Layout: One level Home Adaptive Equipment: Straight cane;Walker - rolling Additional Comments: pt unsure if dtr has RW Prior Function Level of Independence: Independent with assistive device(s) Able to Take Stairs?: Yes Comments: used cane sometimes Communication Communication: No difficulties    Cognition  Cognition Arousal/Alertness: Awake/alert Behavior During Therapy: WFL for tasks assessed/performed Overall Cognitive Status: Within Functional Limits for tasks assessed    Extremity/Trunk Assessment Right Upper Extremity Assessment RUE ROM/Strength/Tone: [facility] for tasks assessed Left Upper Extremity Assessment LUE ROM/Strength/Tone: WFL for tasks assessed Right Lower Extremity Assessment RLE ROM/Strength/Tone: Deficits;Due to pain RLE ROM/Strength/Tone Deficits: ankle WFL; able  to assist with SLR;    Balance Dynamic Standing Balance Dynamic Standing - Balance Support: Left upper extremity supported;During functional activity Dynamic  Standing - Level of Assistance: 4: Min assist  End of Session PT - End of Session Equipment Utilized During Treatment: Gait belt;Right knee immobilizer Activity Tolerance: Patient tolerated treatment well Patient left: in chair;with call bell/phone within reach;with family/visitor present Nurse Communication: Mobility status  GP     [facility] 09/13/2012, 1:49 PM

## 2012-09-13 NOTE — Progress Notes (Signed)
Utilization review completed.  

## 2012-09-13 NOTE — Care Management Note (Addendum)
    Page 1 of 2   09/15/2012     3:19:46 PM   CARE MANAGEMENT NOTE 09/15/2012  Patient:  Cassandra [name], Cassandra [name]   Account Number:  [id number]  Date Initiated:  09/13/2012  Documentation initiated by:  Colleen Can  Subjective/Objective Assessment:   dx osteoarthritis left knee; total knee replacemnt     Action/Plan:   CM spoke with patient. Plans are for patient to go to her daughter's home in [facility] where daughter will be caregiver. States has RW but will need 3n1. She is requesting [facility] for [facility] services. 2nd choice [facility]   Anticipated DC Date:  09/15/2012   Anticipated DC Plan:  HOME W HOME HEALTH SERVICES      DC Planning Services  CM consult      [facility] Choice  HOME HEALTH  DURABLE MEDICAL EQUIPMENT   Choice offered to / List presented to:  C-1 Patient        HH arranged  HH-2 PT      [facility] agency  [facility] Care   Status of service:  Completed, signed off Medicare Important Message given?  NO (If response is "NO", the following Medicare IM given date fields will be blank) Date Medicare IM given:   Date Additional Medicare IM given:    Discharge Disposition:  HOME W HOME HEALTH SERVICES  Per UR Regulation:    If discussed at Long Length of Stay Meetings, dates discussed:    Comments:  09/14/2012 Colleen Can BSN RN CCM [phone] Tct [facility] rep-Lisa regarding [facility] services for HHpt for patient; they will be able to provide services. HHpt orders, op note, H&p, face sheet faxed to [phone] with confirmation. Spoke with pt's daughter 04/16; states she will handle DME-3n1 need for patient CM requested that doctor write prescription for patient for 3N1).     contact information for pt caregiver daughter-[name]-ph-[phone] [address], [place] [address]

## 2012-09-13 NOTE — Plan of Care (Signed)
Problem: Consults Goal: Diagnosis- Total Joint Replacement Outcome: Completed/Met Date Met:  09/13/12 Primary Total Knee RIGHT

## 2012-09-13 NOTE — Progress Notes (Signed)
09/13/12 1700  PT Visit Information  Last PT Received On 09/13/12  Assistance Needed +1  PT Time Calculation  PT Start Time 1450  PT Stop Time 1514  PT Time Calculation (min) 24 min  Subjective Data  Subjective i am ready to get back to bed  Precautions  Precautions Knee  Required Braces or Orthoses Knee Immobilizer - Right  Knee Immobilizer - Right Discontinue once straight leg raise with < 10 degree lag  Restrictions  RLE Weight Bearing WBAT  Cognition  Arousal/Alertness Awake/alert  Behavior During Therapy WFL for tasks assessed/performed  Overall Cognitive Status Within Functional Limits for tasks assessed  Bed Mobility  Bed Mobility Sit to Supine  Sit to Supine 4: Min assist;3: Mod assist  Details for Bed Mobility Assistance cues for technique  Transfers  Transfers Sit to Stand;Stand to Sit  Sit to Stand 4: Min assist;From chair/3-in-1;With upper extremity assist  Stand to Sit 4: Min assist;To bed;With upper extremity assist  Details for Transfer Assistance cues for hands and RLE management  Ambulation/Gait  Ambulation/Gait Assistance 4: Min assist  Ambulation Distance (Feet) 80 Feet  Assistive device Rolling walker  Ambulation/Gait Assistance Details cues for sequence and RW position  Gait Pattern Step-to pattern;Antalgic  Total Joint Exercises  Ankle Circles/Pumps AROM;Both;10 reps  Quad Sets AROM;Both;10 reps  Heel Slides AAROM;Right;10 reps  Hip ABduction/ADduction AAROM;Right;10 reps  Straight Leg Raises AAROM;Right;10 reps  Goniometric ROM 67 degrees  PT - End of Session  Equipment Utilized During Treatment Gait belt;Right knee immobilizer  Activity Tolerance Patient tolerated treatment well  Patient left in bed;with call bell/phone within reach;with family/visitor present  PT - Assessment/Plan  Comments on Treatment Session progressing well  PT Plan Discharge plan remains appropriate;Frequency remains appropriate  PT Frequency 7X/week  Follow Up  Recommendations Home health PT  PT equipment None recommended by PT  Acute Rehab PT Goals  Time For Goal Achievement 09/13/12  Potential to Achieve Goals Good  Pt will go Sit to Stand with supervision  PT Goal: Sit to Stand - Progress Progressing toward goal  Pt will Ambulate 51 - 150 feet;with supervision;with rolling walker  PT Goal: Ambulate - Progress Progressing toward goal  Pt will Perform Home Exercise Program with supervision, verbal cues required/provided  PT Goal: Perform Home Exercise Program - Progress Progressing toward goal  PT General Charges  $$ ACUTE PT VISIT 1 Procedure  PT Treatments  $Gait Training 8-22 mins  $Therapeutic Exercise 8-22 mins

## 2012-09-14 DIAGNOSIS — D62 Acute posthemorrhagic anemia: Secondary | ICD-10-CM

## 2012-09-14 LAB — BASIC METABOLIC PANEL
Chloride: 104 mEq/L (ref 96–112)
GFR calc Af Amer: >90 mL/min (ref >90)
Potassium: 3.6 mEq/L (ref 3.5–5.1)

## 2012-09-14 LAB — CBC
HCT: 28.4 % — ABNORMAL LOW (ref 36.0–46.0)
Platelets: 194 10*3/uL (ref 150–400)
RBC: 3.41 MIL/uL — ABNORMAL LOW (ref 3.87–5.11)
RDW: 14.1 % (ref 11.5–15.5)
WBC: 11.3 10*3/uL — ABNORMAL HIGH (ref 4.0–10.5)

## 2012-09-14 MED ORDER — TRAMADOL HCL 50 MG PO TABS: 50.0000 mg | ORAL_TABLET | Freq: Four times a day (QID) | ORAL | Status: DC | PRN

## 2012-09-14 MED ORDER — METHOCARBAMOL 500 MG PO TABS: 500.0000 mg | ORAL_TABLET | Freq: Four times a day (QID) | ORAL | Status: DC | PRN

## 2012-09-14 MED ORDER — RIVAROXABAN 10 MG PO TABS: 10.0000 mg | ORAL_TABLET | Freq: Every day | ORAL | Status: DC

## 2012-09-14 MED ORDER — OXYCODONE HCL 5 MG PO TABS: 5.0000 mg | ORAL_TABLET | ORAL | Status: DC | PRN

## 2012-09-14 NOTE — Progress Notes (Signed)
:    09/14/2012 Colleen Can BSN RN CCM [phone] Tct [facility] rep-Lisa regarding [facility] services for HHpt for patient; they will be able to provide services. HHpt orders, op note, H&p, face sheet faxed to [phone] with confirmation. Spoke with pt's daughter 04/16; states she will handle DME-3n1 need for patient CM requested that doctor write prescription for patient for 3N1).

## 2012-09-14 NOTE — Progress Notes (Signed)
   Subjective: 2 Days Post-Op Procedure(s) (LRB): RIGHT TOTAL KNEE ARTHROPLASTY (Right) Patient reports pain as mild.   Patient seen in rounds with Dr. Lequita Halt. Patient is well, and has had no acute complaints or problems Patient is ready to go home today.  Objective: Vital signs in last 24 hours: Temp:  [97.7 F (36.5 C)-98.6 F (37 C)] 98 F (36.7 C) (04/17 0930) Pulse Rate:  [60-86] 77 (04/17 0930) Resp:  [14-16] 16 (04/17 0930) BP: (103-127)/(63-71) 112/66 mmHg (04/17 0930) SpO2:  [95 %-100 %] 97 % (04/17 0930)  Intake/Output from previous day:  Intake/Output Summary (Last 24 hours) at 09/14/12 1008 Last data filed at 09/14/12 0848  Gross per 24 hour  Intake 2398.75 ml  Output   2650 ml  Net -251.25 ml    Intake/Output this shift: Total I/O In: 360 [P.O.:360] Out: -   Labs:  Recent Labs  09/13/12 0450 09/14/12 0427  HGB 9.8* 9.3*    Recent Labs  09/13/12 0450 09/14/12 0427  WBC 7.6 11.3*  RBC 3.63* 3.41*  HCT 30.5* 28.4*  PLT 199 194    Recent Labs  09/13/12 0450 09/14/12 0427  NA 135 138  K 3.7 3.6  CL 102 104  CO2 28 27  BUN 9 6  CREATININE 0.57 0.49*  GLUCOSE 132* 136*  CALCIUM 8.4 9.1   No results found for this basename: LABPT, INR,  in the last 72 hours  EXAM: General - Patient is Alert, Appropriate and Oriented Extremity - Neurovascular intact Sensation intact distally Dorsiflexion/Plantar flexion intact No cellulitis present Incision - clean, dry, no drainage, healing Motor Function - intact, moving foot and toes well on exam.   Assessment/Plan: 2 Days Post-Op Procedure(s) (LRB): RIGHT TOTAL KNEE ARTHROPLASTY (Right) Procedure(s) (LRB): RIGHT TOTAL KNEE ARTHROPLASTY (Right) Past Medical History  Diagnosis Date  . Fibromyalgia   . Depression   . GERD (gastroesophageal reflux disease)   . Diverticulosis   . RLS (restless legs syndrome)   . Diarrhea     PROBLEMS WITH DIARRHEA  & STOMACH "LOUD NOISES" - NO PAIN --PT WAS  PUT ON DRUG CREON- FEELS THAT IT HAS HELPED  . Seasonal allergies   . Asthma     PT STATES HER ASTHMA IS MILD-HAS PRO AIR INHALER TO USE IF NEEDED  . OSA (obstructive sleep apnea)     USES CPAP - SETTING 10  . Arthritis   . Anemia     SOMETIMES ANEMIC  . PONV (postoperative nausea and vomiting)    Principal Problem:   OA (osteoarthritis) of knee Active Problems:   Postoperative anemia due to acute blood loss  Estimated body mass index is 31.78 kg/(m^2) as calculated from the following:   Height as of this encounter: 5' 5.51" (1.664 m).   Weight as of this encounter: 87.998 kg (194 lb). Up with therapy Discharge home with home health Diet - Regular diet Follow up - in 2 weeks Activity - WBAT Disposition - Home Condition Upon Discharge - Good D/C Meds - See DC Summary DVT Prophylaxis - Xarelto  ,  09/14/2012, 10:08 AM

## 2012-09-14 NOTE — Progress Notes (Signed)
  LATE ENTRY NOTE Date of Service of Visit - 09/13/2012  Subjective: 1 Days Post-Op Procedure(s) (LRB): RIGHT TOTAL KNEE ARTHROPLASTY (Right) Patient reports pain as mild.   Patient seen in rounds with Dr. Lequita Halt. Doing well for day one. Patient is well, and has had no acute complaints or problems We will start therapy today.  Plan is to go Home after hospital stay.  Objective: Vital signs in last 24 hours: Temp:  [97.7 F (36.5 C)-98.6 F (37 C)] 98 F (36.7 C) (04/17 0930) Pulse Rate:  [60-86] 77 (04/17 0930) Resp:  [14-16] 16 (04/17 0930) BP: (103-127)/(63-71) 112/66 mmHg (04/17 0930) SpO2:  [95 %-100 %] 97 % (04/17 0930)  Intake/Output from previous day:  Intake/Output Summary (Last 24 hours) at 09/14/12 1005 Last data filed at 09/14/12 0848  Gross per 24 hour  Intake 2398.75 ml  Output   2650 ml  Net -251.25 ml    Intake/Output this shift: Total I/O In: 360 [P.O.:360] Out: -   Labs:  Recent Labs  09/13/12 0450   HGB 9.8*     Recent Labs  09/13/12 0450   WBC 7.6   RBC 3.63*   HCT 30.5*   PLT 199     Recent Labs  09/13/12 0450   NA 135   K 3.7   CL 102   CO2 28   BUN 9   CREATININE 0.57   GLUCOSE 132*   CALCIUM 8.4    No results found for this basename: LABPT, INR,  in the last 72 hours  EXAM General - Patient is Alert, Appropriate and Oriented Extremity - Neurovascular intact Sensation intact distally Dorsiflexion/Plantar flexion intact Dressing - dressing C/D/I Motor Function - intact, moving foot and toes well on exam.  Hemovac pulled without difficulty.  Past Medical History  Diagnosis Date  . Fibromyalgia   . Depression   . GERD (gastroesophageal reflux disease)   . Diverticulosis   . RLS (restless legs syndrome)   . Diarrhea     PROBLEMS WITH DIARRHEA  & STOMACH "LOUD NOISES" - NO PAIN --PT WAS PUT ON DRUG CREON- FEELS THAT IT HAS HELPED  . Seasonal allergies   . Asthma     PT STATES HER ASTHMA IS MILD-HAS PRO AIR  INHALER TO USE IF NEEDED  . OSA (obstructive sleep apnea)     USES CPAP - SETTING 10  . Arthritis   . Anemia     SOMETIMES ANEMIC  . PONV (postoperative nausea and vomiting)     Assessment/Plan: 1 Days Post-Op Procedure(s) (LRB): RIGHT TOTAL KNEE ARTHROPLASTY (Right) Principal Problem:   OA (osteoarthritis) of knee Active Problems:   Postoperative anemia due to acute blood loss  Estimated body mass index is 31.78 kg/(m^2) as calculated from the following:   Height as of this encounter: 5' 5.51" (1.664 m).   Weight as of this encounter: 87.998 kg (194 lb). Advance diet Up with therapy Plan for discharge tomorrow Discharge home with home health  DVT Prophylaxis - Xarelto Weight-Bearing as tolerated to right leg No vaccines. D/C O2 and Pulse OX and try on Room Air  PERKINS, ALEXZANDREW 09/14/2012, 10:05 AM

## 2012-09-14 NOTE — Progress Notes (Signed)
Physical Therapy Treatment Patient Details Name: Cassandra [name] MRN: [id number] DOB: 1946-05-01 Today's Date: 09/14/2012 Time: 1116-1202 PT Time Calculation (min): 46 min  PT Assessment / Plan / Recommendation Comments on Treatment Session  progressing well    Follow Up Recommendations  Home health PT     Does the patient have the potential to tolerate intense rehabilitation     Barriers to Discharge        Equipment Recommendations  None recommended by PT    Recommendations for Other Services    Frequency 7X/week   Plan Discharge plan remains appropriate;Frequency remains appropriate    Precautions / Restrictions Precautions Precautions: Knee Required Braces or Orthoses: Knee Immobilizer - Right Knee Immobilizer - Right: Discontinue once straight leg raise with < 10 degree lag Restrictions Weight Bearing Restrictions: No RLE Weight Bearing: Weight bearing as tolerated   Pertinent Vitals/Pain     Mobility  Bed Mobility Bed Mobility: Supine to Sit;Sit to Supine Supine to Sit: 4: Min assist Sit to Supine: 4: Min assist Details for Bed Mobility Assistance: assist with RLE Transfers Transfers: Sit to Stand;Stand to Sit Sit to Stand: 5: Supervision Stand to Sit: 5: Supervision Details for Transfer Assistance: cues for hands and RLE management Ambulation/Gait Ambulation/Gait Assistance: 5: Supervision Ambulation Distance (Feet): 80 Feet Assistive device: Rolling walker Ambulation/Gait Assistance Details: cues for sequence and RW position Gait Pattern: Step-to pattern    Exercises Total Joint Exercises Ankle Circles/Pumps: AROM;Both;10 reps Quad Sets: AROM;Both;10 reps Heel Slides: AAROM;Right;10 reps Hip ABduction/ADduction: AAROM;Right;10 reps Straight Leg Raises: AAROM;Right;10 reps Goniometric ROM: 30-79degrees flexion   PT Diagnosis:    PT Problem List:   PT Treatment Interventions:     PT Goals Acute Rehab PT Goals Time For Goal Achievement:  09/15/12 Potential to Achieve Goals: Good Pt will go Supine/Side to Sit: with supervision PT Goal: Supine/Side to Sit - Progress: Progressing toward goal Pt will go Sit to Stand: with supervision PT Goal: Sit to Stand - Progress: Met Pt will Ambulate: 51 - 150 feet;with supervision;with rolling walker PT Goal: Ambulate - Progress: Progressing toward goal Pt will Perform Home Exercise Program: with supervision, verbal cues required/provided PT Goal: Perform Home Exercise Program - Progress: Progressing toward goal  Visit Information  Last PT Received On: 09/14/12 Assistance Needed: +1    Subjective Data      Cognition  Cognition Arousal/Alertness: Awake/alert Behavior During Therapy: WFL for tasks assessed/performed Overall Cognitive Status: Within Functional Limits for tasks assessed    Balance  Balance Balance Assessed: No  End of Session PT - End of Session Equipment Utilized During Treatment: Right knee immobilizer (pt still with 30 degree quad leg) Activity Tolerance: Patient tolerated treatment well Patient left: in chair;with call bell/phone within reach   GP     [facility] 09/14/2012, 12:21 PM

## 2012-09-14 NOTE — Progress Notes (Signed)
09/14/12 1500  PT Visit Information  Last PT Received On 09/14/12  Assistance Needed +1  PT Time Calculation  PT Start Time 1430  PT Stop Time 1454  PT Time Calculation (min) 24 min  Subjective Data  Subjective I am ready  Patient Stated Goal to daughter's  Precautions  Precautions Knee  Required Braces or Orthoses Knee Immobilizer - Right  Knee Immobilizer - Right Discontinue once straight leg raise with < 10 degree lag  Restrictions  RLE Weight Bearing WBAT  Cognition  Arousal/Alertness Awake/alert  Behavior During Therapy WFL for tasks assessed/performed  Overall Cognitive Status Within Functional Limits for tasks assessed  Bed Mobility  Bed Mobility Not assessed  Transfers  Transfers Sit to Stand;Stand to Sit  Sit to Stand 5: Supervision;6: Modified independent (Device/Increase time)  Stand to Sit 5: Supervision;6: Modified independent (Device/Increase time)  Details for Transfer Assistance subtle cue for hand palcement and to back up to surface  Ambulation/Gait  Ambulation/Gait Assistance 5: Supervision;6: Modified independent (Device/Increase time)  Ambulation Distance (Feet) 85 Feet  Assistive device Rolling walker  Ambulation/Gait Assistance Details cues for step through  Gait Pattern Step-to pattern;Step-through pattern  Stairs Yes  Stairs Assistance 4: Min guard  Stair Management Technique Two rails;No rails;Step to pattern;Forwards;With walker  Number of Stairs 3 (and 1)  PT - End of Session  Equipment Utilized During Treatment Right knee immobilizer  Activity Tolerance Patient tolerated treatment well  Patient left in chair;with call bell/phone within reach;with family/visitor present  PT - Assessment/Plan  Comments on Treatment Session pt daughter educated on donning and doffing of KI;  pt and dtr verbalize understanding;   PT Plan Discharge plan remains appropriate;Frequency remains appropriate  PT Frequency 7X/week  Follow Up Recommendations Home health  PT  PT equipment None recommended by PT  Acute Rehab PT Goals  Time For Goal Achievement 09/15/12  Potential to Achieve Goals Good  Pt will go Sit to Stand with supervision  PT Goal: Sit to Stand - Progress Met  Pt will Ambulate 51 - 150 feet;with supervision;with rolling walker  PT Goal: Ambulate - Progress Met  Pt will Go Up / Down Stairs 1-2 stairs;with min assist  PT Goal: Up/Down Stairs - Progress Met  PT Goal: Perform Home Exercise Program - Progress Met  PT General Charges  $$ ACUTE PT VISIT 1 Procedure  PT Treatments  $Gait Training 23-37 mins

## 2012-09-14 NOTE — Progress Notes (Signed)
Patient and her daughter Jorge Mandril given discharge instructions and they verbalized understanding of all instructions.  Patient understands follow up, when she would need to call MD, incisions care, etc.  Home Health arrangements in place with [facility].  Patient stable for discharge home.  Patient's assessment unchanged from this am.  Allayne Butcher [facility]  09/14/2012  2:40 PM

## 2012-09-14 NOTE — Evaluation (Signed)
Occupational Therapy Evaluation and Discharge Summary Patient Details Name: Cassandra [name] MRN: [id number] DOB: [birth year] Today's Date: 09/14/2012 Time: 1191-4782 OT Time Calculation (min): 18 min  OT Assessment / Plan / Recommendation Clinical Impression  Pt is a 67 yo female admitted for R TKA who is doing very well and only needing occasional assist for adls specifically sock and shoe on the R.  Pt is not in need of acute OT at this time.  Daugther to assist fro 7-10 days at home.    OT Assessment  Patient does not need any further OT services    Follow Up Recommendations  No OT follow up    Barriers to Discharge      Equipment Recommendations  3 in 1 bedside comode    Recommendations for Other Services    Frequency       Precautions / Restrictions Precautions Precautions: Knee Required Braces or Orthoses: Knee Immobilizer - Right Knee Immobilizer - Right: Discontinue once straight leg raise with < 10 degree lag Restrictions Weight Bearing Restrictions: No RLE Weight Bearing: Weight bearing as tolerated   Pertinent Vitals/Pain Pt with 4/10 pain.  Nursing in with meds after treatment.    ADL  Eating/Feeding: Performed;Independent Where Assessed - Eating/Feeding: Chair Grooming: Performed;Wash/dry hands;Wash/dry face;Supervision/safety Where Assessed - Grooming: Supported standing Upper Body Bathing: Performed;Set up Where Assessed - Upper Body Bathing: Unsupported sitting Lower Body Bathing: Performed;Minimal assistance Where Assessed - Lower Body Bathing: Supported sit to stand Upper Body Dressing: Performed;Set up Where Assessed - Upper Body Dressing: Unsupported sitting Lower Body Dressing: Performed;Minimal assistance Where Assessed - Lower Body Dressing: Supported sit to stand Toilet Transfer: Research scientist (life sciences) Method: Sit to stand;Stand pivot Acupuncturist: Comfort height toilet;Grab bars Toileting - Designer, fashion/clothing and Hygiene: Performed;Supervision/safety Where Assessed - Engineer, mining and Hygiene: Standing Tub/Shower Transfer: Performed;Minimal Radiation protection practitioner Method: Ambulating Equipment Used: Rolling walker Transfers/Ambulation Related to ADLs: Pt walked in room for all adls with S.  ADL Comments: Pt only needs occasional assist with adls for R sock and shoe.  Min assist to bear full weight into R leg while stepping over side of tub.    OT Diagnosis:    OT Problem List:   OT Treatment Interventions:     OT Goals    Visit Information  Last OT Received On: 09/14/12 Assistance Needed: +1    Subjective Data  Subjective: " I am going home today." Patient Stated Goal: to go home   Prior Functioning     Home Living Lives With: Alone Available Help at Discharge: Family Type of Home: House Home Access: Stairs to enter Secretary/administrator of Steps: 2 Entrance Stairs-Rails: None Home Layout: One level Bathroom Shower/Tub: Forensic scientist: Standard Home Adaptive Equipment: Straight cane;Walker - rolling Additional Comments: pt unsure if dtr has RW Prior Function Level of Independence: Independent with assistive device(s) Able to Take Stairs?: Yes Driving: Yes Vocation: Retired Musician: No difficulties Dominant Hand: Right         Vision/Perception Vision - History Baseline Vision: No visual deficits Patient Visual Report: No change from baseline Vision - Assessment Vision Assessment: Vision not tested   [employer] Arousal/Alertness: Awake/alert Behavior During Therapy: WFL for tasks assessed/performed Overall Cognitive Status: Within Functional Limits for tasks assessed    Extremity/Trunk Assessment Right Upper Extremity Assessment RUE ROM/Strength/Tone: WFL for tasks assessed RUE Sensation: WFL - Light Touch RUE Coordination: WFL - gross/fine motor Left Upper  Extremity Assessment LUE  ROM/Strength/Tone: [facility] for tasks assessed LUE Sensation: WFL - Light Touch LUE Coordination: WFL - gross/fine motor Trunk Assessment Trunk Assessment: Normal     Mobility Transfers Transfers: Sit to Stand;Stand to Sit Sit to Stand: 5: Supervision;With armrests;From chair/3-in-1 Stand to Sit: 5: Supervision;To chair/3-in-1;With armrests Details for Transfer Assistance: cues for hands and RLE management     Exercise     Balance Balance Balance Assessed: No   End of Session OT - End of Session Activity Tolerance: Patient tolerated treatment well Patient left: in chair;with call bell/phone within reach Nurse Communication: Mobility status  GO     Hope Budds 09/14/2012, 9:33 AM [phone]

## 2012-09-14 NOTE — Discharge Summary (Signed)
Physician Discharge Summary   Patient ID: Cassandra [name] MRN: [id number] DOB/AGE: 1946/01/11 67 y.o.  Admit date: 09/12/2012 Discharge date: 09/14/2012  Primary Diagnosis:  Osteoarthritis Right knee  Admission Diagnoses:  Past Medical History  Diagnosis Date  . Fibromyalgia   . Depression   . GERD (gastroesophageal reflux disease)   . Diverticulosis   . RLS (restless legs syndrome)   . Diarrhea     PROBLEMS WITH DIARRHEA  & STOMACH "LOUD NOISES" - NO PAIN --PT WAS PUT ON DRUG CREON- FEELS THAT IT HAS HELPED  . Seasonal allergies   . Asthma     PT STATES HER ASTHMA IS MILD-HAS PRO AIR INHALER TO USE IF NEEDED  . OSA (obstructive sleep apnea)     USES CPAP - SETTING 10  . Arthritis   . Anemia     SOMETIMES ANEMIC  . PONV (postoperative nausea and vomiting)    Discharge Diagnoses:   Principal Problem:   OA (osteoarthritis) of knee Active Problems:   Postoperative anemia due to acute blood loss  Estimated body mass index is 31.78 kg/(m^2) as calculated from the following:   Height as of this encounter: 5' 5.51" (1.664 m).   Weight as of this encounter: 87.998 kg (194 lb).  Procedure:  Procedure(s) (LRB): RIGHT TOTAL KNEE ARTHROPLASTY (Right)   Consults: None  HPI: Cassandra [name] is a 66 y.o. year old female with end stage OA of her right knee with progressively worsening pain and dysfunction. She has constant pain, with activity and at rest and significant functional deficits with difficulties even with ADLs. She has had extensive non-op management including analgesics, injections of cortisone and viscosupplements, and home exercise program, but remains in significant pain with significant dysfunction.Radiographs show bone on bone arthritis medial and patellofemoral. She presents now for right Total Knee Arthroplasty.   Laboratory Data: Admission on 09/12/2012  Component Date Value Range Status  . WBC 09/13/2012 7.6  4.0 - 10.5 K/uL Final  . RBC 09/13/2012 3.63* 3.87 -  5.11 MIL/uL Final  . Hemoglobin 09/13/2012 9.8* 12.0 - 15.0 g/dL Final  . HCT 40/98/1191 30.5* 36.0 - 46.0 % Final  . MCV 09/13/2012 84.0  78.0 - 100.0 fL Final  . MCH 09/13/2012 27.0  26.0 - 34.0 pg Final  . MCHC 09/13/2012 32.1  30.0 - 36.0 g/dL Final  . RDW 47/82/9562 14.2  11.5 - 15.5 % Final  . Platelets 09/13/2012 199  150 - 400 K/uL Final  . Sodium 09/13/2012 135  135 - 145 mEq/L Final  . Potassium 09/13/2012 3.7  3.5 - 5.1 mEq/L Final  . Chloride 09/13/2012 102  96 - 112 mEq/L Final  . CO2 09/13/2012 28  19 - 32 mEq/L Final  . Glucose, Bld 09/13/2012 132* 70 - 99 mg/dL Final  . BUN 13/12/6576 9  6 - 23 mg/dL Final  . Creatinine, Ser 09/13/2012 0.57  0.50 - 1.10 mg/dL Final  . Calcium 46/96/2952 8.4  8.4 - 10.5 mg/dL Final  . GFR calc non Af Amer 09/13/2012 >90  >90 mL/min Final  . GFR calc Af Amer 09/13/2012 >90  >90 mL/min Final   Comment:                                 The eGFR has been calculated  using the CKD EPI equation.                          This calculation has not been                          validated in all clinical                          situations.                          eGFR's persistently                          <90 mL/min signify                          possible Chronic Kidney Disease.  . WBC 09/14/2012 11.3* 4.0 - 10.5 K/uL Final  . RBC 09/14/2012 3.41* 3.87 - 5.11 MIL/uL Final  . Hemoglobin 09/14/2012 9.3* 12.0 - 15.0 g/dL Final  . HCT 16/03/9603 28.4* 36.0 - 46.0 % Final  . MCV 09/14/2012 83.3  78.0 - 100.0 fL Final  . MCH 09/14/2012 27.3  26.0 - 34.0 pg Final  . MCHC 09/14/2012 32.7  30.0 - 36.0 g/dL Final  . RDW 54/01/8118 14.1  11.5 - 15.5 % Final  . Platelets 09/14/2012 194  150 - 400 K/uL Final  . Sodium 09/14/2012 138  135 - 145 mEq/L Final  . Potassium 09/14/2012 3.6  3.5 - 5.1 mEq/L Final  . Chloride 09/14/2012 104  96 - 112 mEq/L Final  . CO2 09/14/2012 27  19 - 32 mEq/L Final  . Glucose, Bld 09/14/2012  136* 70 - 99 mg/dL Final  . BUN 14/78/2956 6  6 - 23 mg/dL Final  . Creatinine, Ser 09/14/2012 0.49* 0.50 - 1.10 mg/dL Final  . Calcium 21/30/8657 9.1  8.4 - 10.5 mg/dL Final  . GFR calc non Af Amer 09/14/2012 >90  >90 mL/min Final  . GFR calc Af Amer 09/14/2012 >90  >90 mL/min Final   Comment:                                 The eGFR has been calculated                          using the CKD EPI equation.                          This calculation has not been                          validated in all clinical                          situations.                          eGFR's persistently                          <90 mL/min signify  possible Chronic Kidney Disease.  Hospital Outpatient Visit on 09/06/2012  Component Date Value Range Status  . MRSA, PCR 09/06/2012 POSITIVE* NEGATIVE Final  . Staphylococcus aureus 09/06/2012 POSITIVE* NEGATIVE Final   Comment:                                 The Xpert SA Assay (FDA                          approved for NASAL specimens                          in patients over 108 years of age),                          is one component of                          a comprehensive surveillance                          program.  Test performance has                          been validated by [employer] for patients greater                          than or equal to 62 year old.                          It is not intended                          to diagnose infection nor to                          guide or monitor treatment.  Marland Kitchen aPTT 09/06/2012 35  24 - 37 seconds Final  . WBC 09/06/2012 6.4  4.0 - 10.5 K/uL Final  . RBC 09/06/2012 4.52  3.87 - 5.11 MIL/uL Final  . Hemoglobin 09/06/2012 12.4  12.0 - 15.0 g/dL Final  . HCT 29/52/8413 37.9  36.0 - 46.0 % Final  . MCV 09/06/2012 83.8  78.0 - 100.0 fL Final  . MCH 09/06/2012 27.4  26.0 - 34.0 pg Final  . MCHC 09/06/2012 32.7  30.0 - 36.0 g/dL Final  .  RDW 24/40/1027 14.1  11.5 - 15.5 % Final  . Platelets 09/06/2012 297  150 - 400 K/uL Final  . Sodium 09/06/2012 139  135 - 145 mEq/L Final  . Potassium 09/06/2012 4.1  3.5 - 5.1 mEq/L Final  . Chloride 09/06/2012 102  96 - 112 mEq/L Final  . CO2 09/06/2012 30  19 - 32 mEq/L Final  . Glucose, Bld 09/06/2012 88  70 - 99 mg/dL Final  . BUN 25/36/6440 11  6 - 23 mg/dL Final  . Creatinine, Ser 09/06/2012 0.68  0.50 - 1.10 mg/dL Final  . Calcium 34/74/2595 9.9  8.4 - 10.5 mg/dL Final  .  Total Protein 09/06/2012 7.3  6.0 - 8.3 g/dL Final  . Albumin 81/19/1478 4.0  3.5 - 5.2 g/dL Final  . AST 29/56/2130 21  0 - 37 U/L Final  . ALT 09/06/2012 6  0 - 35 U/L Final  . Alkaline Phosphatase 09/06/2012 90  39 - 117 U/L Final  . Total Bilirubin 09/06/2012 0.2* 0.3 - 1.2 mg/dL Final  . GFR calc non Af Amer 09/06/2012 90* >90 mL/min Final  . GFR calc Af Amer 09/06/2012 >90  >90 mL/min Final   Comment:                                 The eGFR has been calculated                          using the CKD EPI equation.                          This calculation has not been                          validated in all clinical                          situations.                          eGFR's persistently                          <90 mL/min signify                          possible Chronic Kidney Disease.  Marland Kitchen Prothrombin Time 09/06/2012 13.3  11.6 - 15.2 seconds Final  . INR 09/06/2012 1.02  0.00 - 1.49 Final  . ABO/RH(D) 09/06/2012 O POS   Final  . Antibody Screen 09/06/2012 NEG   Final  . Sample Expiration 09/06/2012 09/15/2012   Final  . Color, Urine 09/06/2012 YELLOW  YELLOW Final  . APPearance 09/06/2012 CLEAR  CLEAR Final  . Specific Gravity, Urine 09/06/2012 1.015  1.005 - 1.030 Final  . pH 09/06/2012 6.0  5.0 - 8.0 Final  . Glucose, UA 09/06/2012 NEGATIVE  NEGATIVE mg/dL Final  . Hgb urine dipstick 09/06/2012 NEGATIVE  NEGATIVE Final  . Bilirubin Urine 09/06/2012 NEGATIVE  NEGATIVE Final  .  Ketones, ur 09/06/2012 NEGATIVE  NEGATIVE mg/dL Final  . Protein, ur 86/57/8469 NEGATIVE  NEGATIVE mg/dL Final  . Urobilinogen, UA 09/06/2012 0.2  0.0 - 1.0 mg/dL Final  . Nitrite 62/95/2841 NEGATIVE  NEGATIVE Final  . Leukocytes, UA 09/06/2012 TRACE* NEGATIVE Final  . Squamous Epithelial / LPF 09/06/2012 FEW* RARE Final  . WBC, UA 09/06/2012 3-6  <3 WBC/hpf Final  . Bacteria, UA 09/06/2012 FEW* RARE Final  . Urine-Other 09/06/2012 MUCOUS PRESENT   Final  . Specimen Description 09/06/2012 URINE, CLEAN CATCH   Final  . Special Requests 09/06/2012 NONE   Final  . Culture  Setup Time 09/06/2012 09/06/2012 22:56   Final  . Colony Count 09/06/2012 NO GROWTH   Final  . Culture 09/06/2012 NO GROWTH   Final  . Report Status 09/06/2012 09/07/2012 FINAL   Final  . ABO/RH(D) 09/06/2012 O POS  Final     X-Rays:No results found.  EKG: Orders placed in visit on 07/30/09  . CONVERTED CEMR EKG     Hospital Course: Cassandra [name] is a 67 y.o. who was admitted to [facility]. They were brought to the operating room on 09/12/2012 and underwent Procedure(s): RIGHT TOTAL KNEE ARTHROPLASTY.  Patient tolerated the procedure well and was later transferred to the recovery room and then to the orthopaedic floor for postoperative care.  They were given PO and IV analgesics for pain control following their surgery.  They were given 24 hours of postoperative antibiotics of  Anti-infectives   Start     Dose/Rate Route Frequency Ordered Stop   09/13/12 0400  vancomycin (VANCOCIN) IVPB 1000 mg/200 mL premix     1,000 mg 200 mL/hr over 60 Minutes Intravenous Every 12 hours 09/12/12 1851 09/13/12 0528   09/12/12 1157  vancomycin (VANCOCIN) 1,500 mg in sodium chloride 0.9 % 500 mL IVPB     1,500 mg 250 mL/hr over 120 Minutes Intravenous On call to O.R. 09/12/12 1157 09/12/12 1526     and started on DVT prophylaxis in the form of Xarelto.   PT and OT were ordered for total joint protocol.  Discharge planning  consulted to help with postop disposition and equipment needs.  Patient had a good night on the evening of surgery.  They started to get up OOB with therapy on day one. Hemovac drain was pulled without difficulty.  Continued to work with therapy into day two.  Dressing was changed on day two and the incision was healing well.  By day three, the patient had progressed with therapy and meeting their goals.  Incision was healing well.  Patient was seen in rounds and was ready to go home later that same day.   Discharge Medications: Prior to Admission medications   Medication Sig Start Date End Date Taking? Authorizing Provider  acetaminophen (TYLENOL) 500 MG tablet Take 500 mg by mouth at bedtime.   Yes Historical Provider, MD  albuterol (PROVENTIL HFA;VENTOLIN HFA) 108 (90 BASE) MCG/ACT inhaler Inhale 2 puffs into the lungs every 6 (six) hours as needed for wheezing.   Yes Historical Provider, MD  cetirizine (ZYRTEC) 10 MG tablet Take 10 mg by mouth daily.    Yes Historical Provider, MD  omeprazole-sodium bicarbonate (ZEGERID) 40-1100 MG per capsule Take 1 capsule by mouth daily.    Yes Historical Provider, MD  Polyethyl Glycol-Propyl Glycol (SYSTANE OP) Place 1 drop into both eyes at bedtime.   Yes Historical Provider, MD  ranitidine (ZANTAC) 150 MG capsule Take 150 mg by mouth 2 (two) times daily.    Yes Historical Provider, MD  sertraline (ZOLOFT) 50 MG tablet Take 50 mg by mouth daily. TAKES ONE EVERY AM   Yes Historical Provider, MD  spironolactone (ALDACTONE) 25 MG tablet Take 25 mg by mouth at bedtime.    Yes Historical Provider, MD  temazepam (RESTORIL) 30 MG capsule Take 30 mg by mouth at bedtime.   Yes Historical Provider, MD  methocarbamol (ROBAXIN) 500 MG tablet Take 1 tablet (500 mg total) by mouth every 6 (six) hours as needed. 09/14/12   Alexzandrew Perkins, PA-C  oxyCODONE (OXY IR/ROXICODONE) 5 MG immediate release tablet Take 1-2 tablets (5-10 mg total) by mouth every 3 (three) hours as  needed. 09/14/12   Alexzandrew Julien Girt, PA-C  rivaroxaban (XARELTO) 10 MG TABS tablet Take 1 tablet (10 mg total) by mouth daily with breakfast. Take Xarelto for two and  a half more weeks, then discontinue Xarelto. 09/14/12   Alexzandrew Perkins, PA-C  traMADol (ULTRAM) 50 MG tablet Take 1-2 tablets (50-100 mg total) by mouth every 6 (six) hours as needed (mild pain). 09/14/12   Alexzandrew Julien Girt, PA-C    Diet: Regular diet Activity:WBAT Follow-up:in 2 weeks Disposition - Home Discharged Condition: good   Discharge Orders   Future Orders Complete By Expires     Call MD / Call 911  As directed     Comments:      If you experience chest pain or shortness of breath, CALL 911 and be transported to the hospital emergency room.  If you develope a fever above 101 F, pus (white drainage) or increased drainage or redness at the wound, or calf pain, call your surgeon's office.    Change dressing  As directed     Comments:      Change dressing daily with sterile 4 x 4 inch gauze dressing and apply TED hose. Do not submerge the incision under water.    Constipation Prevention  As directed     Comments:      Drink plenty of fluids.  Prune juice may be helpful.  You may use a stool softener, such as Colace (over the counter) 100 mg twice a day.  Use MiraLax (over the counter) for constipation as needed.    Diet general  As directed     Discharge instructions  As directed     Comments:      Pick up stool softner and laxative for home. Do not submerge incision under water. May shower. Continue to use ice for pain and swelling from surgery.  Take Xarelto for two and a half more weeks, then discontinue Xarelto.    Do not put a pillow under the knee. Place it under the heel.  As directed     Do not sit on low chairs, stoools or toilet seats, as it may be difficult to get up from low surfaces  As directed     Driving restrictions  As directed     Comments:      No driving until released by the  physician.    Increase activity slowly as tolerated  As directed     Lifting restrictions  As directed     Comments:      No lifting until released by the physician.    Patient may shower  As directed     Comments:      You may shower without a dressing once there is no drainage.  Do not wash over the wound.  If drainage remains, do not shower until drainage stops.    TED hose  As directed     Comments:      Use stockings (TED hose) for 3 weeks on both leg(s).  You may remove them at night for sleeping.    Weight bearing as tolerated  As directed         Medication List    STOP taking these medications       CREON 24000 UNITS Cpep  Generic drug:  Pancrelipase (Lip-Prot-Amyl)     fish oil-omega-3 fatty acids 1000 MG capsule     MINIVELLE 0.0375 MG/24HR  Generic drug:  estradiol     naproxen sodium 220 MG tablet  Commonly known as:  ANAPROX     VITAMIN B-12 PO     VITAMIN D PO      TAKE these medications  acetaminophen 500 MG tablet  Commonly known as:  TYLENOL  Take 500 mg by mouth at bedtime.     albuterol 108 (90 BASE) MCG/ACT inhaler  Commonly known as:  PROVENTIL HFA;VENTOLIN HFA  Inhale 2 puffs into the lungs every 6 (six) hours as needed for wheezing.     cetirizine 10 MG tablet  Commonly known as:  ZYRTEC  Take 10 mg by mouth daily.     methocarbamol 500 MG tablet  Commonly known as:  ROBAXIN  Take 1 tablet (500 mg total) by mouth every 6 (six) hours as needed.     oxyCODONE 5 MG immediate release tablet  Commonly known as:  Oxy IR/ROXICODONE  Take 1-2 tablets (5-10 mg total) by mouth every 3 (three) hours as needed.     ranitidine 150 MG capsule  Commonly known as:  ZANTAC  Take 150 mg by mouth 2 (two) times daily.     rivaroxaban 10 MG Tabs tablet  Commonly known as:  XARELTO  Take 1 tablet (10 mg total) by mouth daily with breakfast. Take Xarelto for two and a half more weeks, then discontinue Xarelto.     sertraline 50 MG tablet    Commonly known as:  ZOLOFT  Take 50 mg by mouth daily. TAKES ONE EVERY AM     spironolactone 25 MG tablet  Commonly known as:  ALDACTONE  Take 25 mg by mouth at bedtime.     SYSTANE OP  Place 1 drop into both eyes at bedtime.     temazepam 30 MG capsule  Commonly known as:  RESTORIL  Take 30 mg by mouth at bedtime.     traMADol 50 MG tablet  Commonly known as:  ULTRAM  Take 1-2 tablets (50-100 mg total) by mouth every 6 (six) hours as needed (mild pain).     ZEGERID 40-1100 MG per capsule  Generic drug:  omeprazole-sodium bicarbonate  Take 1 capsule by mouth daily.           Follow-up Information   Follow up with Loanne Drilling, MD. Schedule an appointment as soon as possible for a visit in 2 weeks.   Contact information:   [address] [place] [address] [phone]       Signed: Patrica Duel 09/14/2012, 10:13 AM

## 2012-09-15 DIAGNOSIS — G2581 Restless legs syndrome: Secondary | ICD-10-CM | POA: Diagnosis not present

## 2012-09-15 DIAGNOSIS — Z471 Aftercare following joint replacement surgery: Secondary | ICD-10-CM | POA: Diagnosis not present

## 2012-09-15 DIAGNOSIS — M1710 Unilateral primary osteoarthritis, unspecified knee: Secondary | ICD-10-CM | POA: Diagnosis not present

## 2012-09-15 DIAGNOSIS — Z96659 Presence of unspecified artificial knee joint: Secondary | ICD-10-CM | POA: Diagnosis not present

## 2012-09-15 DIAGNOSIS — IMO0001 Reserved for inherently not codable concepts without codable children: Secondary | ICD-10-CM | POA: Diagnosis not present

## 2012-09-15 DIAGNOSIS — R262 Difficulty in walking, not elsewhere classified: Secondary | ICD-10-CM | POA: Diagnosis not present

## 2012-09-18 DIAGNOSIS — G2581 Restless legs syndrome: Secondary | ICD-10-CM | POA: Diagnosis not present

## 2012-09-18 DIAGNOSIS — Z96659 Presence of unspecified artificial knee joint: Secondary | ICD-10-CM | POA: Diagnosis not present

## 2012-09-18 DIAGNOSIS — Z471 Aftercare following joint replacement surgery: Secondary | ICD-10-CM | POA: Diagnosis not present

## 2012-09-18 DIAGNOSIS — IMO0001 Reserved for inherently not codable concepts without codable children: Secondary | ICD-10-CM | POA: Diagnosis not present

## 2012-09-18 DIAGNOSIS — M1710 Unilateral primary osteoarthritis, unspecified knee: Secondary | ICD-10-CM | POA: Diagnosis not present

## 2012-09-18 DIAGNOSIS — R262 Difficulty in walking, not elsewhere classified: Secondary | ICD-10-CM | POA: Diagnosis not present

## 2012-09-20 DIAGNOSIS — Z471 Aftercare following joint replacement surgery: Secondary | ICD-10-CM | POA: Diagnosis not present

## 2012-09-20 DIAGNOSIS — Z96659 Presence of unspecified artificial knee joint: Secondary | ICD-10-CM | POA: Diagnosis not present

## 2012-09-20 DIAGNOSIS — R262 Difficulty in walking, not elsewhere classified: Secondary | ICD-10-CM | POA: Diagnosis not present

## 2012-09-20 DIAGNOSIS — G2581 Restless legs syndrome: Secondary | ICD-10-CM | POA: Diagnosis not present

## 2012-09-20 DIAGNOSIS — M1710 Unilateral primary osteoarthritis, unspecified knee: Secondary | ICD-10-CM | POA: Diagnosis not present

## 2012-09-20 DIAGNOSIS — IMO0001 Reserved for inherently not codable concepts without codable children: Secondary | ICD-10-CM | POA: Diagnosis not present

## 2012-09-23 DIAGNOSIS — M1710 Unilateral primary osteoarthritis, unspecified knee: Secondary | ICD-10-CM | POA: Diagnosis not present

## 2012-09-23 DIAGNOSIS — IMO0001 Reserved for inherently not codable concepts without codable children: Secondary | ICD-10-CM | POA: Diagnosis not present

## 2012-09-23 DIAGNOSIS — G2581 Restless legs syndrome: Secondary | ICD-10-CM | POA: Diagnosis not present

## 2012-09-23 DIAGNOSIS — R262 Difficulty in walking, not elsewhere classified: Secondary | ICD-10-CM | POA: Diagnosis not present

## 2012-09-23 DIAGNOSIS — Z96659 Presence of unspecified artificial knee joint: Secondary | ICD-10-CM | POA: Diagnosis not present

## 2012-09-23 DIAGNOSIS — Z471 Aftercare following joint replacement surgery: Secondary | ICD-10-CM | POA: Diagnosis not present

## 2012-09-25 DIAGNOSIS — IMO0001 Reserved for inherently not codable concepts without codable children: Secondary | ICD-10-CM | POA: Diagnosis not present

## 2012-09-25 DIAGNOSIS — Z96659 Presence of unspecified artificial knee joint: Secondary | ICD-10-CM | POA: Diagnosis not present

## 2012-09-25 DIAGNOSIS — Z471 Aftercare following joint replacement surgery: Secondary | ICD-10-CM | POA: Diagnosis not present

## 2012-09-25 DIAGNOSIS — R262 Difficulty in walking, not elsewhere classified: Secondary | ICD-10-CM | POA: Diagnosis not present

## 2012-09-25 DIAGNOSIS — M1710 Unilateral primary osteoarthritis, unspecified knee: Secondary | ICD-10-CM | POA: Diagnosis not present

## 2012-09-25 DIAGNOSIS — G2581 Restless legs syndrome: Secondary | ICD-10-CM | POA: Diagnosis not present

## 2012-09-27 DIAGNOSIS — Z471 Aftercare following joint replacement surgery: Secondary | ICD-10-CM | POA: Diagnosis not present

## 2012-09-27 DIAGNOSIS — M1710 Unilateral primary osteoarthritis, unspecified knee: Secondary | ICD-10-CM | POA: Diagnosis not present

## 2012-09-27 DIAGNOSIS — G2581 Restless legs syndrome: Secondary | ICD-10-CM | POA: Diagnosis not present

## 2012-09-27 DIAGNOSIS — IMO0001 Reserved for inherently not codable concepts without codable children: Secondary | ICD-10-CM | POA: Diagnosis not present

## 2012-09-27 DIAGNOSIS — Z96659 Presence of unspecified artificial knee joint: Secondary | ICD-10-CM | POA: Diagnosis not present

## 2012-09-27 DIAGNOSIS — R262 Difficulty in walking, not elsewhere classified: Secondary | ICD-10-CM | POA: Diagnosis not present

## 2012-09-29 DIAGNOSIS — G2581 Restless legs syndrome: Secondary | ICD-10-CM | POA: Diagnosis not present

## 2012-09-29 DIAGNOSIS — Z471 Aftercare following joint replacement surgery: Secondary | ICD-10-CM | POA: Diagnosis not present

## 2012-09-29 DIAGNOSIS — R262 Difficulty in walking, not elsewhere classified: Secondary | ICD-10-CM | POA: Diagnosis not present

## 2012-09-29 DIAGNOSIS — Z96659 Presence of unspecified artificial knee joint: Secondary | ICD-10-CM | POA: Diagnosis not present

## 2012-09-29 DIAGNOSIS — IMO0001 Reserved for inherently not codable concepts without codable children: Secondary | ICD-10-CM | POA: Diagnosis not present

## 2012-09-29 DIAGNOSIS — M1710 Unilateral primary osteoarthritis, unspecified knee: Secondary | ICD-10-CM | POA: Diagnosis not present

## 2012-10-02 DIAGNOSIS — IMO0001 Reserved for inherently not codable concepts without codable children: Secondary | ICD-10-CM | POA: Diagnosis not present

## 2012-10-02 DIAGNOSIS — Z471 Aftercare following joint replacement surgery: Secondary | ICD-10-CM | POA: Diagnosis not present

## 2012-10-02 DIAGNOSIS — M1710 Unilateral primary osteoarthritis, unspecified knee: Secondary | ICD-10-CM | POA: Diagnosis not present

## 2012-10-02 DIAGNOSIS — Z96659 Presence of unspecified artificial knee joint: Secondary | ICD-10-CM | POA: Diagnosis not present

## 2012-10-02 DIAGNOSIS — G2581 Restless legs syndrome: Secondary | ICD-10-CM | POA: Diagnosis not present

## 2012-10-02 DIAGNOSIS — R262 Difficulty in walking, not elsewhere classified: Secondary | ICD-10-CM | POA: Diagnosis not present

## 2012-10-21 ENCOUNTER — Observation Stay (HOSPITAL_COMMUNITY)
Admission: EM | Admit: 2012-10-21 | Discharge: 2012-10-23 | Disposition: A | Discharge status: Home / Self Care | Payer: 59 | Attending: Internal Medicine | Admitting: Internal Medicine

## 2012-10-21 ENCOUNTER — Encounter (HOSPITAL_COMMUNITY): Payer: Self-pay

## 2012-10-21 DIAGNOSIS — N390 Urinary tract infection, site not specified: Secondary | ICD-10-CM

## 2012-10-21 DIAGNOSIS — R42 Dizziness and giddiness: Principal | ICD-10-CM

## 2012-10-21 DIAGNOSIS — IMO0001 Reserved for inherently not codable concepts without codable children: Secondary | ICD-10-CM | POA: Insufficient documentation

## 2012-10-21 DIAGNOSIS — M1710 Unilateral primary osteoarthritis, unspecified knee: Secondary | ICD-10-CM | POA: Diagnosis present

## 2012-10-21 DIAGNOSIS — H8110 Benign paroxysmal vertigo, unspecified ear: Secondary | ICD-10-CM

## 2012-10-21 DIAGNOSIS — G2581 Restless legs syndrome: Secondary | ICD-10-CM | POA: Diagnosis present

## 2012-10-21 DIAGNOSIS — K219 Gastro-esophageal reflux disease without esophagitis: Secondary | ICD-10-CM | POA: Insufficient documentation

## 2012-10-21 DIAGNOSIS — R404 Transient alteration of awareness: Secondary | ICD-10-CM | POA: Diagnosis not present

## 2012-10-21 DIAGNOSIS — R112 Nausea with vomiting, unspecified: Secondary | ICD-10-CM

## 2012-10-21 DIAGNOSIS — G4733 Obstructive sleep apnea (adult) (pediatric): Secondary | ICD-10-CM | POA: Diagnosis present

## 2012-10-21 DIAGNOSIS — R5381 Other malaise: Secondary | ICD-10-CM | POA: Diagnosis not present

## 2012-10-21 DIAGNOSIS — R7989 Other specified abnormal findings of blood chemistry: Secondary | ICD-10-CM | POA: Insufficient documentation

## 2012-10-21 LAB — URINALYSIS, ROUTINE W REFLEX MICROSCOPIC
Bilirubin Urine: NEGATIVE
Glucose, UA: NEGATIVE mg/dL
Hgb urine dipstick: NEGATIVE
Ketones, ur: NEGATIVE mg/dL
Nitrite: NEGATIVE
Protein, ur: NEGATIVE mg/dL
Specific Gravity, Urine: 1.021 (ref 1.005–1.030)
Urobilinogen, UA: 0.2 mg/dL (ref 0.0–1.0)
pH: 6.5 (ref 5.0–8.0)

## 2012-10-21 LAB — URINE MICROSCOPIC-ADD ON

## 2012-10-21 LAB — BASIC METABOLIC PANEL
BUN: 10 mg/dL (ref 6–23)
CO2: 27 mEq/L (ref 19–32)
Calcium: 9.6 mg/dL (ref 8.4–10.5)
Chloride: 103 mEq/L (ref 96–112)
Creatinine, Ser: 0.54 mg/dL (ref 0.50–1.10)
GFR calc Af Amer: >90 mL/min (ref >90)
GFR calc non Af Amer: >90 mL/min (ref >90)
Glucose, Bld: 104 mg/dL — ABNORMAL HIGH (ref 70–99)
Potassium: 3.6 mEq/L (ref 3.5–5.1)
Sodium: 141 mEq/L (ref 135–145)

## 2012-10-21 LAB — CBC
HCT: 37.7 % (ref 36.0–46.0)
Hemoglobin: 12.2 g/dL (ref 12.0–15.0)
MCH: 26.5 pg (ref 26.0–34.0)
MCHC: 32.4 g/dL (ref 30.0–36.0)
MCV: 81.8 fL (ref 78.0–100.0)
Platelets: 262 10*3/uL (ref 150–400)
RBC: 4.61 MIL/uL (ref 3.87–5.11)
RDW: 13.9 % (ref 11.5–15.5)
WBC: 7 10*3/uL (ref 4.0–10.5)

## 2012-10-21 MED ORDER — LORAZEPAM 1 MG PO TABS: 1.0000 mg | ORAL_TABLET | Freq: Once | ORAL | Status: DC

## 2012-10-21 MED ORDER — ONDANSETRON HCL 4 MG/2ML IJ SOLN
4.0000 mg | Freq: Four times a day (QID) | INTRAMUSCULAR | Status: DC | PRN
  Administered 2012-10-22: 4 mg via INTRAVENOUS
  Filled 2012-10-21: qty 2

## 2012-10-21 MED ORDER — MECLIZINE HCL 25 MG PO TABS
25.0000 mg | ORAL_TABLET | Freq: Once | ORAL | Status: AC
  Administered 2012-10-21: 25 mg via ORAL

## 2012-10-21 MED ORDER — CEFTRIAXONE SODIUM 1 G IJ SOLR
1.0000 g | INTRAMUSCULAR | Status: DC
  Administered 2012-10-22: 1 g via INTRAVENOUS
  Filled 2012-10-21 (×2): qty 10

## 2012-10-21 MED ORDER — SERTRALINE HCL 50 MG PO TABS
50.0000 mg | ORAL_TABLET | Freq: Every day | ORAL | Status: DC
  Administered 2012-10-22 – 2012-10-23 (×2): 50 mg via ORAL
  Filled 2012-10-21 (×4): qty 1

## 2012-10-21 MED ORDER — MECLIZINE HCL 25 MG PO TABS
25.0000 mg | ORAL_TABLET | Freq: Three times a day (TID) | ORAL | Status: DC
  Administered 2012-10-22 – 2012-10-23 (×5): 25 mg via ORAL
  Filled 2012-10-21 (×7): qty 1

## 2012-10-21 MED ORDER — DEXTROSE 5 % IV SOLN
1.0000 g | Freq: Once | INTRAVENOUS | Status: AC
  Administered 2012-10-21: 1 g via INTRAVENOUS
  Filled 2012-10-21: qty 10

## 2012-10-21 MED ORDER — SODIUM CHLORIDE 0.9 % IV BOLUS (SEPSIS)
1000.0000 mL | Freq: Once | INTRAVENOUS | Status: AC
  Administered 2012-10-21: 1000 mL via INTRAVENOUS

## 2012-10-21 MED ORDER — PANCRELIPASE (LIP-PROT-AMYL) 12000-38000 UNITS PO CPEP
1.0000 | ORAL_CAPSULE | Freq: Three times a day (TID) | ORAL | Status: DC
  Administered 2012-10-22 – 2012-10-23 (×6): 1 via ORAL
  Filled 2012-10-21 (×7): qty 1

## 2012-10-21 MED ORDER — LORATADINE 10 MG PO TABS
10.0000 mg | ORAL_TABLET | Freq: Every day | ORAL | Status: DC
  Administered 2012-10-21 – 2012-10-23 (×3): 10 mg via ORAL
  Filled 2012-10-21 (×3): qty 1

## 2012-10-21 MED ORDER — FAMOTIDINE 20 MG PO TABS
20.0000 mg | ORAL_TABLET | Freq: Two times a day (BID) | ORAL | Status: DC
  Administered 2012-10-21 – 2012-10-23 (×4): 20 mg via ORAL
  Filled 2012-10-21 (×5): qty 1

## 2012-10-21 MED ORDER — SPIRONOLACTONE 25 MG PO TABS
25.0000 mg | ORAL_TABLET | Freq: Every day | ORAL | Status: DC
  Administered 2012-10-21 – 2012-10-22 (×2): 25 mg via ORAL
  Filled 2012-10-21 (×3): qty 1

## 2012-10-21 MED ORDER — PROMETHAZINE HCL 25 MG/ML IJ SOLN
12.5000 mg | INTRAMUSCULAR | Status: AC
  Administered 2012-10-21: 12.5 mg via INTRAVENOUS
  Filled 2012-10-21: qty 1

## 2012-10-21 MED ORDER — TEMAZEPAM 15 MG PO CAPS
30.0000 mg | ORAL_CAPSULE | Freq: Every day | ORAL | Status: DC
  Administered 2012-10-21 – 2012-10-22 (×2): 30 mg via ORAL
  Filled 2012-10-21 (×2): qty 2

## 2012-10-21 MED ORDER — ENOXAPARIN SODIUM 40 MG/0.4ML ~~LOC~~ SOLN
40.0000 mg | SUBCUTANEOUS | Status: DC
  Administered 2012-10-21 – 2012-10-22 (×2): 40 mg via SUBCUTANEOUS
  Filled 2012-10-21 (×3): qty 0.4

## 2012-10-21 MED ORDER — LORAZEPAM 2 MG/ML IJ SOLN
1.0000 mg | Freq: Once | INTRAMUSCULAR | Status: AC
  Administered 2012-10-21: 1 mg via INTRAVENOUS
  Filled 2012-10-21: qty 1

## 2012-10-21 MED ORDER — ONDANSETRON HCL 4 MG/2ML IJ SOLN
4.0000 mg | Freq: Once | INTRAMUSCULAR | Status: AC
  Administered 2012-10-21: 4 mg via INTRAVENOUS
  Filled 2012-10-21: qty 2

## 2012-10-21 MED ORDER — SODIUM CHLORIDE 0.9 % IV SOLN
INTRAVENOUS | Status: DC
  Administered 2012-10-21 – 2012-10-23 (×4): via INTRAVENOUS

## 2012-10-21 MED ORDER — METHOCARBAMOL 500 MG PO TABS
500.0000 mg | ORAL_TABLET | Freq: Four times a day (QID) | ORAL | Status: DC | PRN
  Administered 2012-10-23: 500 mg via ORAL
  Filled 2012-10-21 (×2): qty 1

## 2012-10-21 MED ORDER — ALBUTEROL SULFATE HFA 108 (90 BASE) MCG/ACT IN AERS
2.0000 | INHALATION_SPRAY | Freq: Four times a day (QID) | RESPIRATORY_TRACT | Status: DC | PRN
  Filled 2012-10-21: qty 6.7

## 2012-10-21 MED ORDER — ACETAMINOPHEN 650 MG RE SUPP: 650.0000 mg | Freq: Four times a day (QID) | RECTAL | Status: DC | PRN

## 2012-10-21 MED ORDER — ONDANSETRON HCL 4 MG PO TABS: 4.0000 mg | ORAL_TABLET | Freq: Four times a day (QID) | ORAL | Status: DC | PRN

## 2012-10-21 MED ORDER — ACETAMINOPHEN 325 MG PO TABS
650.0000 mg | ORAL_TABLET | Freq: Four times a day (QID) | ORAL | Status: DC | PRN
  Administered 2012-10-22 – 2012-10-23 (×3): 650 mg via ORAL
  Filled 2012-10-21 (×3): qty 2

## 2012-10-21 MED ORDER — PANTOPRAZOLE SODIUM 40 MG PO TBEC
40.0000 mg | DELAYED_RELEASE_TABLET | Freq: Every day | ORAL | Status: DC
  Administered 2012-10-21 – 2012-10-23 (×3): 40 mg via ORAL
  Filled 2012-10-21 (×3): qty 1

## 2012-10-21 NOTE — ED Notes (Signed)
Pt. Continues to be nauseated and dizzy,  Pt. Medicated per MD orders.  Pt. Also reports a scratchy throat from vomiting.  Will continue to monitor.   Family at the bedside.

## 2012-10-21 NOTE — ED Provider Notes (Signed)
History     CSN: [id number]  Arrival date & time 10/21/12  1107   First MD Initiated Contact with Patient 10/21/12 1118      Chief Complaint  Patient presents with  . Dizziness    (Consider location/radiation/quality/duration/timing/severity/associated sxs/prior treatment) The history is provided by the patient and a relative. No language interpreter was used.   Pt is a 67yo female BIB daughter and sister-in-law for vertigo associated with nausea and vomiting.  Pt's daughter states she was called earlier this morning with pt screaming in the phone "the pictures are moving, the house is moving, and the tv is shaking"  Daughter states pt seemed very anxious, EMS was called to enter the house.  Has been nauseous and dry heaving since 9am.  Turning head to the right makes symptoms worse.  Reports previous hx of vertigo.  Has tried meclizine in the past but made her sick.  Reports feeling well up until this morning.  Denies fever, chills, SOB, chest pain, or abdominal pain.  Denies trauma to head or LOC.  Denies numbness or weakness.   Per pt's daughter, she is very anxious since passing of husband 18yrs ago.  Past Medical History  Diagnosis Date  . Fibromyalgia   . Depression   . GERD (gastroesophageal reflux disease)   . Diverticulosis   . RLS (restless legs syndrome)   . Diarrhea     PROBLEMS WITH DIARRHEA  & STOMACH "LOUD NOISES" - NO PAIN --PT WAS PUT ON DRUG CREON- FEELS THAT IT HAS HELPED  . Seasonal allergies   . Asthma     PT STATES HER ASTHMA IS MILD-HAS PRO AIR INHALER TO USE IF NEEDED  . OSA (obstructive sleep apnea)     USES CPAP - SETTING 10  . Arthritis   . Anemia     SOMETIMES ANEMIC  . PONV (postoperative nausea and vomiting)     Past Surgical History  Procedure Laterality Date  . Vaginal hysterectomy    . Bladder tacking    . Breast lumpectomy      BENIGN  . Abdominal hysterectomy    . Right knee arthroscopy    . Total knee arthroplasty Right 09/12/2012     Procedure: RIGHT TOTAL KNEE ARTHROPLASTY;  Surgeon: Loanne Drilling, MD;  Location: [facility] ORS;  Service: Orthopedics;  Laterality: Right;    Family History  Problem Relation Age of Onset  . Kidney cancer Mother   . Diabetes Mother     History  Substance Use Topics  . Smoking status: Never Smoker   . Smokeless tobacco: Never Used  . Alcohol Use: No    OB History   Grav Para Term Preterm Abortions TAB SAB Ect Mult Living                  Review of Systems  Constitutional: Negative for fever and chills.  Cardiovascular: Negative for chest pain.  Gastrointestinal: Positive for nausea and vomiting. Negative for diarrhea.  Neurological: Positive for dizziness. Negative for tremors, seizures, syncope, speech difficulty, weakness, light-headedness, numbness and headaches.  Psychiatric/Behavioral: The patient is nervous/anxious.   All other systems reviewed and are negative.    Allergies  Hydrochlorothiazide; Shellfish allergy; and Sulfonamide derivatives  Home Medications   No current outpatient prescriptions on file.  BP 136/66  Pulse 71  Temp(Src) 98.3 F (36.8 C) (Oral)  Resp 18  Ht 5\' 5"  (1.651 m)  Wt 183 lb 13.8 oz (83.4 kg)  BMI 30.6 kg/m2  SpO2  100%  Physical Exam  Nursing note and vitals reviewed. Constitutional: She is oriented to person, place, and time. She appears well-developed and well-nourished. No distress.  Pt lying on left side in bed with damp washcloth over her eyes. Pt hesitant to move head due to fear of dizziness and n/v  HENT:  Head: Normocephalic and atraumatic.  Eyes: Conjunctivae are normal. No scleral icterus.  Neck: Normal range of motion.  Cardiovascular: Normal rate, regular rhythm and normal heart sounds.   Pulmonary/Chest: Effort normal and breath sounds normal. No respiratory distress. She has no wheezes. She has no rales. She exhibits no tenderness.  Abdominal: Soft. Bowel sounds are normal. She exhibits no distension and no mass.  There is no tenderness. There is no rebound and no guarding.  Musculoskeletal: Normal range of motion.  Neurological: She is alert and oriented to person, place, and time. She has normal strength. No cranial nerve deficit or sensory deficit. Coordination normal. GCS eye subscore is 4. GCS verbal subscore is 5. GCS motor subscore is 6.  CN II-XII in tact, no focal deficit. Nl sensation. 5/5 grip strength and 5/5 dorsiflexion/plantarflexion.  Nl coordination.   Skin: Skin is warm and dry. She is not diaphoretic.  Psychiatric: Her mood appears anxious.    ED Course  Procedures (including critical care time)  Labs Reviewed  BASIC METABOLIC PANEL - Abnormal; Notable for the following:    Glucose, Bld 104 (*)    All other components within normal limits  URINALYSIS, ROUTINE W REFLEX MICROSCOPIC - Abnormal; Notable for the following:    APPearance CLOUDY (*)    Leukocytes, UA MODERATE (*)    All other components within normal limits  URINE MICROSCOPIC-ADD ON - Abnormal; Notable for the following:    Squamous Epithelial / LPF FEW (*)    Bacteria, UA MANY (*)    Crystals CA OXALATE CRYSTALS (*)    All other components within normal limits  MRSA PCR SCREENING  URINE CULTURE  CBC   No results found.   1. Vertigo   2. Nausea & vomiting   3. UTI (lower urinary tract infection)       MDM  Pt with hx of vertigo presenting with dizziness and vomiting.  Symptoms started this morning.  Denies recent illness or trauma to head.  No LOC, numbness, or weakness. Nl neuro exam.  Believed to be refractory vertigo.  No imaging needed at this time.  Will attempt symptom control.   Tx: zofran,  No relief. Ativan and zofran, minimal relief.  Pt vomited again after moving head to right.  Ativan and phenergan.  Pt got up to give urine sample and vomited again.  Pt now hallucinating.  Will admit pt to control symptoms.  Discussed with family who agreed with plan.    UA: leukocytosis, many bacteria-UTI   Started pt on 1g Rocephin, gave another 1L of fluids Dr. Ranae Palms examined pt as well, consulted hospitalist.  Dr. Gonzella Lex, internal med, agreed to admit pt to tele obs. Team 8.  Pt stable at this time.    Junius Finner, PA-C 10/22/12 1119

## 2012-10-21 NOTE — H&P (Signed)
[facility] History and Physical  EMERSYN WYSS [name]:[id number] DOB: 1945-06-11 DOA: 10/21/2012  Referring physician: Dr Ranae Palms PCP: Londell Moh, MD   Chief Complaint: Vertigo x 1 day  HPI:  67 y/o female with OSA, hx of vertigo ( last episode 5 years back),, recent rt knee replacement presented to ED with acute onset of vertigo this morning. patient was watching TV when the screen appeared to move up and down  and slowly felt the surrounding to spin around. She was very nauseous and started to vomit. The vertigo worsened with trying to move her head side to side. patient had almost 10-12 episodes of vomiting at home. She called daughter as she was very anxious.  denies any fever, chills, headache, URI symptoms, tinnitus, chest pain, palpitations, SOB, abdominal pain, bowel or urinary symptoms. In the ED patient continued to be dizzy and vomiting. Given meclizine which she threw up. Given IV fluids, antiemetics with some relief. UA showed UTI and given IV rocephin. Labs unremarkable. [facility] called for admit under observation   Review of Systems:  Constitutional: Denies fever, chills, diaphoresis, appetite change and fatigue.  HEENT: Denies photophobia, eye pain, redness, hearing loss, ear pain, congestion, sore throat, rhinorrhea, sneezing, mouth sores, trouble swallowing, neck pain, neck stiffness and tinnitus.   Respiratory: Denies SOB, DOE, cough, chest tightness,  and wheezing.   Cardiovascular: Denies chest pain, palpitations and leg swelling.  Gastrointestinal: nausea, vomiting, Denies abdominal pain, diarrhea, constipation, blood in stool and abdominal distention.  Genitourinary: Denies dysuria, urgency, frequency, hematuria, flank pain and difficulty urinating.  Musculoskeletal: Denies myalgias, back pain, joint swelling, arthralgias and gait problem. recent rt knee repair  Skin: Denies pallor, rash and wound.  Neurological: dizziness, vertigo with  nausea and vomiting, Denies  seizures, syncope, weakness, light-headedness, numbness and headaches.  Hematological: Denies adenopathy. Easy bruising, personal or family bleeding history  Psychiatric/Behavioral: Denies suicidal ideation, mood changes, confusion, nervousness, sleep disturbance and agitation   Past Medical History  Diagnosis Date  . Fibromyalgia   . Depression   . GERD (gastroesophageal reflux disease)   . Diverticulosis   . RLS (restless legs syndrome)   . Diarrhea     PROBLEMS WITH DIARRHEA  & STOMACH "LOUD NOISES" - NO PAIN --PT WAS PUT ON DRUG CREON- FEELS THAT IT HAS HELPED  . Seasonal allergies   . Asthma     PT STATES HER ASTHMA IS MILD-HAS PRO AIR INHALER TO USE IF NEEDED  . OSA (obstructive sleep apnea)     USES CPAP - SETTING 10  . Arthritis   . Anemia     SOMETIMES ANEMIC  . PONV (postoperative nausea and vomiting)    Past Surgical History  Procedure Laterality Date  . Vaginal hysterectomy    . Bladder tacking    . Breast lumpectomy      BENIGN  . Abdominal hysterectomy    . Right knee arthroscopy    . Total knee arthroplasty Right 09/12/2012    Procedure: RIGHT TOTAL KNEE ARTHROPLASTY;  Surgeon: Loanne Drilling, MD;  Location: [facility];  Service: Orthopedics;  Laterality: Right;   Social History:  reports that she has never smoked. She has never used smokeless tobacco. She reports that she does not drink alcohol or use illicit drugs.  Allergies  Allergen Reactions  . Hydrochlorothiazide     Unknown  . Shellfish Allergy     HIVES  . Sulfonamide Derivatives Hives    Family History  Problem Relation Age of Onset  .  Kidney cancer Mother   . Diabetes Mother     Prior to Admission medications   Medication Sig Start Date End Date Taking? Authorizing Provider  acetaminophen (TYLENOL) 500 MG tablet Take 500 mg by mouth at bedtime.   Yes Historical Provider, MD  albuterol (PROVENTIL HFA;VENTOLIN HFA) 108 (90 BASE) MCG/ACT inhaler Inhale 2 puffs into  the lungs every 6 (six) hours as needed for wheezing.   Yes Historical Provider, MD  cetirizine (ZYRTEC) 10 MG tablet Take 10 mg by mouth daily.    Yes Historical Provider, MD  methocarbamol (ROBAXIN) 500 MG tablet Take 1 tablet (500 mg total) by mouth every 6 (six) hours as needed. 09/14/12  Yes Alexzandrew Julien Girt, PA-C  omeprazole-sodium bicarbonate (ZEGERID) 40-1100 MG per capsule Take 1 capsule by mouth daily.    Yes Historical Provider, MD  Pancrelipase, Lip-Prot-Amyl, (CREON) 24000 UNITS CPEP Take 4,800 Units by mouth 3 (three) times daily with meals.   Yes Historical Provider, MD  Polyethyl Glycol-Propyl Glycol (SYSTANE OP) Place 1 drop into both eyes at bedtime as needed (dry eyes).    Yes Historical Provider, MD  ranitidine (ZANTAC) 150 MG capsule Take 150 mg by mouth 2 (two) times daily.    Yes Historical Provider, MD  sertraline (ZOLOFT) 50 MG tablet Take 50 mg by mouth daily with breakfast.    Yes Historical Provider, MD  spironolactone (ALDACTONE) 25 MG tablet Take 25 mg by mouth at bedtime.    Yes Historical Provider, MD  temazepam (RESTORIL) 30 MG capsule Take 30 mg by mouth at bedtime.   Yes Historical Provider, MD    Physical Exam:  Filed Vitals:   10/21/12 1500 10/21/12 1517 10/21/12 1635 10/21/12 1728  BP: 122/86 113/68 113/67 144/72  Pulse: 93 103 87 79  Temp:    97.9 F (36.6 C)  TempSrc:    Oral  Resp:  16 18 16   Height:    5\' 5"  (1.651 m)  Weight:    83.4 kg (183 lb 13.8 oz)  SpO2: 99% 100% 97% 100%    Constitutional: Vital signs reviewed.  Patient is a well-developed and well-nourished in no acute distress and cooperative with exam. Alert and oriented x3.  Head: Normocephalic and atraumatic Ear: TM normal bilaterally Mouth: no erythema or exudates, dry mucosa Eyes: PERRL, EOMI, conjunctivae normal, No scleral icterus.  Neck: Supple, , No JVD, mass, thyromegaly, or carotid bruit present.  Cardiovascular: RRR, S1 normal, S2 normal, no MRG, pulses symmetric and  intact bilaterally Pulmonary/Chest: CTAB, no wheezes, rales, or rhonchi Abdominal: Soft. Non-tender, non-distended, bowel sounds are normal, no masses, organomegaly, or guarding present.  GU: no CVA tenderness Musculoskeletal: No joint deformities, erythema, or stiffness, ROM full and no nontender Ext: no edema and no cyanosis, pulses palpable bilaterally (DP and PT) Hematology: no cervical, adenopathy.  Neurological: A&O x3, horizontal nystagmus on right lateral gaze, Strenght is normal and symmetric bilaterally, cranial nerve II-XII are grossly intact, no focal motor deficit, sensory intact to light touch bilaterally. normal cerebellar function Skin: Warm, dry and intact. No rash, cyanosis, or clubbing.  Psychiatric: Normal mood and affect. speech and behavior is normal. Judgment and thought content normal. Cognition and memory are normal.   Labs on Admission:  Basic Metabolic Panel:  Recent Labs Lab 10/21/12 1235  NA 141  K 3.6  CL 103  CO2 27  GLUCOSE 104*  BUN 10  CREATININE 0.54  CALCIUM 9.6   Liver Function Tests: No results found for this basename: AST, ALT,  ALKPHOS, BILITOT, PROT, ALBUMIN,  in the last 168 hours No results found for this basename: LIPASE, AMYLASE,  in the last 168 hours No results found for this basename: AMMONIA,  in the last 168 hours CBC:  Recent Labs Lab 10/21/12 1235  WBC 7.0  HGB 12.2  HCT 37.7  MCV 81.8  PLT 262   Cardiac Enzymes: No results found for this basename: CKTOTAL, CKMB, CKMBINDEX, TROPONINI,  in the last 168 hours BNP: No components found with this basename: POCBNP,  CBG: No results found for this basename: GLUCAP,  in the last 168 hours  Radiological Exams on Admission: No results found.    Assessment/Plan  Principal Problem:   Vertigo Admit to obs overnight Appears to be due to BPPV with horizontal nystagmus on right lateral gaze on exam. dix Hallpike maneuver at bedside positive. Will continue IV fluids and  antiemetics Scheduled meclizine PT eval. Needs vestibular rehab as outpt  if symptoms recur or worsen will obtain MRI brain to  evaluate   posterior circulation . Clear liquids  Active Problems:      UTI (lower urinary tract infection) Incidental finding follow cs  continue rocephin   OSA On CPAP    Diet: clears DVT prophylaxis: sq lovenox  Code Status: FULL Family Communication: daughter at bedside Disposition Plan: home in am if symptoms resolved  Eddie North [facility] Pager [phone]  If 7PM-7AM, please contact night-coverage [email or web address] Password [facility] 10/21/2012, 5:51 PM    Total time spent: 45 minutes

## 2012-10-21 NOTE — Progress Notes (Signed)
Pt is refusing CPAP tonight. She states that she is feeling sick on her stomach and does not want to try. I explained that is she changes her mind to please let RN know,

## 2012-10-21 NOTE — ED Notes (Signed)
Pt. Is aware of needing an urine specimen and also taking her meclizine.  She states, "I feel a little better, but I cannot take that medicine yet"   Pt. Continues to have nausea with movement.  Will continue to monitor.

## 2012-10-21 NOTE — ED Notes (Signed)
Junius Finner, PA is at the bedside.   Reported to Dr. Ranae Palms and Christy Sartorius, pt. Is unable to take the meclizine yet.

## 2012-10-21 NOTE — ED Notes (Addendum)
Pt. Woke up and was getting ready to get  Out of bed and began feeling dizzy with nausea.  Pt. Has a hx of vertigo.  No Neuro deficits noted.  Pt. Is alert and oriented X4.  Movement increases the vertigo,.  Pt. Denies any sob or chest pain.  Speech is clear.

## 2012-10-21 NOTE — ED Notes (Signed)
Reported to Dr. Ranae Palms and Junius Finner that after receiving 12.5 mg Iv Phenergan and 1mg  ATivan IV, pt. Is very restless, grabbing at the air and seeing things that are not there.  Pt. Is alert and oriented X4. Able to follow commands and denies any pain.  Pt. Was given 2 ice chips and pt. Began to vomit and have dry heaves.  Also reported that to Dr. Ranae Palms and Ms. Gershon Mussel, [place]  Pt.'s vitals stable.

## 2012-10-21 NOTE — ED Notes (Signed)
Pt. Is laying on her lt. Side and cannot move her head due to the vertigo and n/v.    Pt. Is gagging. Ms. Cassandra [name] aware.

## 2012-10-22 LAB — URINE CULTURE

## 2012-10-22 LAB — MRSA PCR SCREENING: MRSA by PCR: NEGATIVE

## 2012-10-22 NOTE — Progress Notes (Signed)
Physical Therapy Progress Note  10/22/12 1456  PT Visit Information  Last PT Received On 10/22/12  Assistance Needed +1  PT Time Calculation  PT Start Time 1437  PT Stop Time 1453  PT Time Calculation (min) 16 min  Subjective Data  Subjective "I've been sitting upright in the bed for a while.  I do feel better."  Precautions  Precautions Fall  Restrictions  Weight Bearing Restrictions No  Cognition  Arousal/Alertness Awake/alert  Behavior During Therapy WFL for tasks assessed/performed  Overall Cognitive Status Within Functional Limits for tasks assessed  Bed Mobility  Bed Mobility Supine to Sit;Sit to Supine  Supine to Sit 4: Min guard;With rails;HOB elevated  Sit to Supine 4: Min guard;With rail;HOB elevated  Details for Bed Mobility Assistance No cues needed.  Patient with dizziness of 2/5 with moving to sitting lasting 20 seconds.  Transfers  Transfers Sit to Stand;Stand to Sit  Sit to Stand 4: Min assist;With upper extremity assist;From bed  Stand to Sit 4: Min assist;With upper extremity assist;To bed  Details for Transfer Assistance Verbal cues for hand placement.  Had patient choose a target to focus on during tranfers.  Assist to rise to standing, and for balance.  Patient with dizziness 2/5 with standing.  Ambulation/Gait  Ambulation/Gait Assistance 4: Min assist  Ambulation Distance (Feet) 96 Feet  Assistive device Rolling walker  Ambulation/Gait Assistance Details Verbal cues to stay close to RW for safety.  Had patient focus on target during gait.  Dizziness 2/5 with gait.  Instructed patient on technique for turning to minimize dizziness.  Gait Pattern Step-through pattern;Decreased step length - right;Decreased step length - left  Gait velocity Slow gait speed  Balance  Balance Assessed Yes  Static Standing Balance  Single Leg Stance - Left Leg 20  Rhomberg - Eyes Opened 25  Rhomberg - Eyes Closed 15  PT - End of Session  Equipment Utilized During Treatment  Gait belt  Activity Tolerance Patient limited by fatigue  Patient left in bed;with call bell/phone within reach  Nurse Communication Mobility status  PT - Assessment/Plan  Comments on Treatment Session Encouraged patient to continue to sit upright as much as she can tolerate.  Patient did well with compensation techniques for ambulation.  Will benefit from OP PT vestibular program.  PT Plan Discharge plan remains appropriate;Frequency remains appropriate  PT Frequency Min 4X/week  Follow Up Recommendations Outpatient PT;Supervision/Assistance - 24 hour  PT equipment None recommended by PT  Acute Rehab PT Goals  PT Goal: Supine/Side to Sit - Progress Progressing toward goal  PT Goal: Sit to Supine/Side - Progress Progressing toward goal  PT Goal: Sit to Stand - Progress Progressing toward goal  PT Goal: Stand to Sit - Progress Progressing toward goal  PT Goal: Ambulate - Progress Progressing toward goal  PT General Charges  $$ ACUTE PT VISIT 1 Procedure  PT Treatments  $Gait Training 8-22 mins  Durenda Hurt. Renaldo Fiddler, [facility] Acute Rehab Services Pager [phone]

## 2012-10-22 NOTE — Progress Notes (Signed)
Pt given 4mg  IV Zofran at this time to help prevent nausea while working with PT.

## 2012-10-22 NOTE — Progress Notes (Signed)
Pt denies nausea at this time; will cont. To monitor.

## 2012-10-22 NOTE — Progress Notes (Signed)
[facility] PROGRESS NOTE  Cassandra [name] ZOX:[id number] DOB: [birth year] DOA: 10/21/2012 PCP: Londell Moh, MD  Brief narrative 67 y/o female with OSA, hx of vertigo ( last episode 5 years back),, recent rt knee replacement presented to ED with acute onset of vertigo.  Assessment/Plan: Vertigo  likely BPPV . Patient still has symptoms with positional change associated with nausea. horizontal nystagmus on right lateral gaze. continue IV fluids , antiemetics and meclizine.  given persistent symptoms., will obtain MRI brain to r/o posterior circulation ischemia/ infarct. See by PT and recommends outpt vestibular rehab.  UTI Continue Rocephin. Follow cx   Diet: Regular  Code Status: full Family Communication: none at bedside Disposition Plan: tomorrow if MRI negative and symptoms   Consultants:  none  Procedures:  none  Antibiotics:  none  HPI/Subjective: symptoms more better but still has vertigo on changing positions associated with some nausea.  Objective: Filed Vitals:   10/21/12 1728 10/21/12 2005 10/22/12 0300 10/22/12 1420  BP: 144/72 136/74 136/66 136/73  Pulse: 79 80 71 79  Temp: 97.9 F (36.6 C) 98.8 F (37.1 C) 98.3 F (36.8 C) 97.8 F (36.6 C)  TempSrc: Oral Oral Oral Oral  Resp: 16 18 18 16   Height: 5\' 5"  (1.651 m)     Weight: 83.4 kg (183 lb 13.8 oz)     SpO2: 100% 99% 100% 99%    Intake/Output Summary (Last 24 hours) at 10/22/12 1712 Last data filed at 10/22/12 1700  Gross per 24 hour  Intake   2260 ml  Output   1600 ml  Net    660 ml   Filed Weights   10/21/12 1728  Weight: 83.4 kg (183 lb 13.8 oz)    Exam: Elderly female in no acute distress HEENT: No pallor, moist oral mucosa, horizontal nystagmus on right lateral gaze Chest: Clear to auscultation bilaterally,  CVS: Normal S1 and S2, no murmurs Abdomen: Soft, nontender, nondistended, bowel sounds present CNS: AAO x3   Data Reviewed: Basic Metabolic  Panel:  Recent Labs Lab 10/21/12 1235  NA 141  K 3.6  CL 103  CO2 27  GLUCOSE 104*  BUN 10  CREATININE 0.54  CALCIUM 9.6   Liver Function Tests: No results found for this basename: AST, ALT, ALKPHOS, BILITOT, PROT, ALBUMIN,  in the last 168 hours No results found for this basename: LIPASE, AMYLASE,  in the last 168 hours No results found for this basename: AMMONIA,  in the last 168 hours CBC:  Recent Labs Lab 10/21/12 1235  WBC 7.0  HGB 12.2  HCT 37.7  MCV 81.8  PLT 262   Cardiac Enzymes: No results found for this basename: CKTOTAL, CKMB, CKMBINDEX, TROPONINI,  in the last 168 hours BNP (last 3 results) No results found for this basename: PROBNP,  in the last 8760 hours CBG: No results found for this basename: GLUCAP,  in the last 168 hours  Recent Results (from the past 240 hour(s))  URINE CULTURE     Status: None   Collection Time    10/21/12  2:46 PM      Result Value Range Status   Specimen Description URINE, CLEAN CATCH   Final   Special Requests NONE   Final   Culture  Setup Time 10/21/2012 20:44   Final   Colony Count 25,000 COLONIES/ML   Final   Culture     Final   Value: Multiple bacterial morphotypes present, none predominant. Suggest appropriate recollection if clinically indicated.   Report  Status 10/22/2012 FINAL   Final  MRSA PCR SCREENING     Status: None   Collection Time    10/22/12  8:02 AM      Result Value Range Status   MRSA by PCR NEGATIVE  NEGATIVE Final   Comment:            The GeneXpert MRSA Assay (FDA     approved for NASAL specimens     only), is one component of a     comprehensive MRSA colonization     surveillance program. It is not     intended to diagnose MRSA     infection nor to guide or     monitor treatment for     MRSA infections.     Studies: No results found.  Scheduled Meds: . cefTRIAXone (ROCEPHIN)  IV  1 g Intravenous Q24H  . enoxaparin (LOVENOX) injection  40 mg Subcutaneous Q24H  . famotidine  20 mg  Oral BID  . lipase/protease/amylase  1 capsule Oral TID AC  . loratadine  10 mg Oral Daily  . meclizine  25 mg Oral TID  . pantoprazole  40 mg Oral Daily  . sertraline  50 mg Oral Q breakfast  . spironolactone  25 mg Oral QHS  . temazepam  30 mg Oral QHS   Continuous Infusions: . sodium chloride 100 mL/hr at 10/22/12 0648      Time spent: 25 minutes    ,   [facility] Pager [phone]. If 7PM-7AM, please contact night-coverage at [email or web address], password [facility] 10/22/2012, 5:12 PM  LOS: 1 day

## 2012-10-22 NOTE — ED Provider Notes (Signed)
Medical screening examination/treatment/procedure(s) were conducted as a shared visit with non-physician practitioner(s) and myself.  I personally evaluated the patient during the encounter  Pt with history of vertigo. Still symptomatic after IV meds/fluids. Unable to ambulate. Will admit to observation.   Loren Racer, MD 10/22/12 1304

## 2012-10-22 NOTE — Evaluation (Signed)
Physical Therapy Evaluation Patient Details Name: PATTIANN SOLANKI MRN: [id number] DOB: January 07, 1946 Today's Date: 10/22/2012 Time: 1021-1106 PT Time Calculation (min): 45 min  PT Assessment / Plan / Recommendation Clinical Impression  Patient is a 67 yo female admitted with vertigo with nausea/vomiting, and a UTI.  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  It takes her 1-4 days of laying down before it goes away.  Her prior episode was 5 years ago.  Patient with normal smooth pursuits, saccades, VOR.  Patient reports minimal dizziness with roll test to both sides with no nystagmus.  Performed Hallpike-Dix to both sides with no dizziness or nystagmus.  When patient moves supine <-> sitting, notes 4/5 dizziness x 30 seconds.  Nausea also associated with the dizziness.  Patient with positional vertigo, primarily with motion straight forward and backward.  Will have patient put HOB upright x 20 minutes several times throughout day.  Patient will benefit from acute PT to address dizziness, Rt knee ROM, and mobility.  Recommend f/u with OP PT for vestibular rehab.    PT Assessment  Patient needs continued PT services    Follow Up Recommendations  Outpatient PT;Supervision/Assistance - 24 hour (Vestibular Rehab Program)    Does the patient have the potential to tolerate intense rehabilitation      Barriers to Discharge None      Equipment Recommendations  None recommended by PT    Recommendations for Other Services     Frequency Min 4X/week    Precautions / Restrictions Precautions Precautions: Fall Restrictions Weight Bearing Restrictions: No   Pertinent Vitals/Pain       Mobility  Bed Mobility Bed Mobility: Rolling Right;Rolling Left;Right Sidelying to Sit;Sitting - Scoot to Edge of Bed;Sit to Supine Rolling Right: 6: Modified independent (Device/Increase time);With rail Rolling Left: 6: Modified independent  (Device/Increase time);With rail Right Sidelying to Sit: 4: Min guard;With rails;HOB flat Sitting - Scoot to Edge of Bed: 4: Min guard;With rail Sit to Supine: 4: Min guard;With rail;HOB flat Details for Bed Mobility Assistance: No cues needed.  Assist for balance due to dizziness.  No dizziness or nystagmus noted with rolling.  Dizziness of 4/5 noted with movement from supine to sitting, without nystagmus. Transfers Transfers: Not assessed    Exercises Other Exercises Other Exercises: Raise HOB upright x 20 minutes throughout day.   PT Diagnosis: Difficulty walking;Abnormality of gait (Dizziness)  PT Problem List: Decreased activity tolerance;Decreased balance;Decreased mobility;Decreased knowledge of use of DME;Other (comment) (Dizziness) PT Treatment Interventions: DME instruction;Gait training;Functional mobility training;Therapeutic exercise;Patient/family education (vestibular rehab)   PT Goals Acute Rehab PT Goals PT Goal Formulation: With patient Time For Goal Achievement: 10/29/12 Potential to Achieve Goals: Good Pt will go Supine/Side to Sit: with supervision;with HOB 0 degrees (With dizziness at or below 2/5) PT Goal: Supine/Side to Sit - Progress: Goal set today Pt will go Sit to Supine/Side: with supervision;with HOB 0 degrees (with dizziness at or below 2/5) PT Goal: Sit to Supine/Side - Progress: Goal set today Pt will go Sit to Stand: with supervision;with upper extremity assist (with dizziness at or below 2/5) PT Goal: Sit to Stand - Progress: Goal set today Pt will go Stand to Sit: with supervision;with upper extremity assist PT Goal: Stand to Sit - Progress: Goal set today Pt will Ambulate: 51 - 150 feet;with supervision;with rolling walker (using target compensation technique with dizziness 2/5) PT Goal: Ambulate - Progress: Goal set today  Visit Information  Last PT Received On: 10/22/12 Assistance Needed: +1    Subjective Data  Subjective: "Things just start  whirling when I sit up."  "I'm worried about my knee.  I've missed my therapy visit." Patient Stated Goal: To stop the dizziness   Prior Functioning  Home Living Lives With: Alone Available Help at Discharge: Family;Available PRN/intermittently Type of Home: House Home Access: Stairs to enter [employer] of Steps: 2 Entrance Stairs-Rails: None Home Layout: One level Bathroom Shower/Tub: Forensic scientist: Standard Bathroom Accessibility: Yes How Accessible: Accessible via walker Home Adaptive Equipment: Straight cane;Walker - rolling Prior Function Level of Independence: Independent with assistive device(s) (Using cane post TKA) Able to Take Stairs?: Yes Driving: Yes Vocation: Retired Comments: Continues with OP PT for TKA Communication Communication: No difficulties    Cognition  Cognition Arousal/Alertness: Awake/alert Behavior During Therapy: WFL for tasks assessed/performed Overall Cognitive Status: Within Functional Limits for tasks assessed    Extremity/Trunk Assessment Right Upper Extremity Assessment RUE ROM/Strength/Tone: WFL for tasks assessed RUE Sensation: WFL - Light Touch Left Upper Extremity Assessment LUE ROM/Strength/Tone: WFL for tasks assessed LUE Sensation: WFL - Light Touch Right Lower Extremity Assessment RLE ROM/Strength/Tone: Deficits RLE ROM/Strength/Tone Deficits: Strength 4/5; Knee ROM at -20 degrees extension and 90 degrees flexion RLE Sensation: WFL - Light Touch Left Lower Extremity Assessment LLE ROM/Strength/Tone: WFL for tasks assessed LLE Sensation: WFL - Light Touch Trunk Assessment Trunk Assessment: Normal   Balance    End of Session PT - End of Session Activity Tolerance: Treatment limited secondary to medical complications (Comment);Patient limited by fatigue (Limited by dizziness and nausea) Patient left: in bed;with call bell/phone within reach Nurse Communication: Mobility status   GP Functional Assessment Tool Used: Clinical judgement Functional Limitation: Mobility: Walking and moving around Mobility: Walking and Moving Around Current Status ([id number]): At least 20 percent but less than 40 percent impaired, limited or restricted Mobility: Walking and Moving Around Goal Status [phone]): At least 1 percent but less than 20 percent impaired, limited or restricted   Vena Austria 10/22/2012, 4:10 PM Durenda Hurt. Renaldo Fiddler, [facility] Acute Rehab Services Pager [phone]

## 2012-10-22 NOTE — Progress Notes (Signed)
Pt refuses cpap at this time stating she doesn't feel she needs it and is going home in the morning.

## 2012-10-23 ENCOUNTER — Observation Stay (HOSPITAL_COMMUNITY): Payer: 59

## 2012-10-23 DIAGNOSIS — R112 Nausea with vomiting, unspecified: Secondary | ICD-10-CM

## 2012-10-23 DIAGNOSIS — N390 Urinary tract infection, site not specified: Secondary | ICD-10-CM

## 2012-10-23 DIAGNOSIS — H8110 Benign paroxysmal vertigo, unspecified ear: Secondary | ICD-10-CM

## 2012-10-23 MED ORDER — MECLIZINE HCL 25 MG PO TABS: 25.0000 mg | ORAL_TABLET | Freq: Three times a day (TID) | ORAL | Status: DC | PRN

## 2012-10-23 MED ORDER — ONDANSETRON HCL 4 MG PO TABS: 4.0000 mg | ORAL_TABLET | Freq: Four times a day (QID) | ORAL | Status: DC | PRN

## 2012-10-23 NOTE — Progress Notes (Signed)
Utilization review completed.  , RN, BSN. 

## 2012-10-23 NOTE — Discharge Summary (Signed)
Physician Discharge Summary  Cassandra [name] EAV:[id number] DOB: 1945/10/22 DOA: 10/21/2012  PCP: Londell Moh, MD  Admit date: 10/21/2012 Discharge date: 10/23/2012  Time spent: 25 minutes  Recommendations for Outpatient Follow-up:  Home with outpatient vestibular rehab  Discharge Diagnoses:  Principal Problem:   BPPV (benign paroxysmal positional vertigo)  Active Problems:   Nausea & vomiting   UTI (lower urinary tract infection)   OBSTRUCTIVE SLEEP APNEA    Discharge Condition: *fair  Diet recommendation: regular  Filed Weights   10/21/12 1728  Weight: 83.4 kg (183 lb 13.8 oz)    History of present illness:  Please reer to admission H&P for details, but in brief, 67 y/o female with OSA, hx of vertigo ( last episode 5 years back),, recent rt knee replacement presented to ED with acute onset of vertigo.   Hospital Course:  Vertigo  likely BPPV . Given IV fluids, antiemetics adn scheduled meclizine. Patient still had symptoms with positional change associated with nausea on day 2. horizontal nystagmus on right lateral gaze present .  given persistent symptoms, MRI brain done  to r/o posterior circulation ischemia/ infarct. And was negative See by PT and recommends outpt vestibular rehab.  Symptoms much improved now. patient stable for discharge. Will prescribe prn meclizine and antiemetics.  UTI  Treated with 3 daysof rocephin Asymptomatic. Urine cx negative  Diet: Regular  Code Status: full     Discharge Exam: Filed Vitals:   10/22/12 1420 10/22/12 2029 10/23/12 0504 10/23/12 1355  BP: 136/73 141/69 134/81 149/73  Pulse: 79 73 70 68  Temp: 97.8 F (36.6 C) 98.6 F (37 C) 98.8 F (37.1 C) 98.5 F (36.9 C)  TempSrc: Oral Oral Oral Oral  Resp: 16 16 16 16   Height:      Weight:      SpO2: 99% 98% 97% 98%    Discharge Instructions Elderly female in no acute distress  HEENT: No pallor, moist oral mucosa, horizontal nystagmus on right lateral  gaze  Chest: Clear to auscultation bilaterally,  CVS: Normal S1 and S2, no murmurs  Abdomen: Soft, nontender, nondistended, bowel sounds present  CNS: AAO x3     Medication List    TAKE these medications       acetaminophen 500 MG tablet  Commonly known as:  TYLENOL  Take 500 mg by mouth at bedtime.     albuterol 108 (90 BASE) MCG/ACT inhaler  Commonly known as:  PROVENTIL HFA;VENTOLIN HFA  Inhale 2 puffs into the lungs every 6 (six) hours as needed for wheezing.     cetirizine 10 MG tablet  Commonly known as:  ZYRTEC  Take 10 mg by mouth daily.     CREON 24000 UNITS Cpep  Generic drug:  Pancrelipase (Lip-Prot-Amyl)  Take 4,800 Units by mouth 3 (three) times daily with meals.     meclizine 25 MG tablet  Commonly known as:  ANTIVERT  Take 1 tablet (25 mg total) by mouth 3 (three) times daily as needed for dizziness.     methocarbamol 500 MG tablet  Commonly known as:  ROBAXIN  Take 1 tablet (500 mg total) by mouth every 6 (six) hours as needed.     ondansetron 4 MG tablet  Commonly known as:  ZOFRAN  Take 1 tablet (4 mg total) by mouth every 6 (six) hours as needed for nausea.     ranitidine 150 MG capsule  Commonly known as:  ZANTAC  Take 150 mg by mouth 2 (two) times daily.  sertraline 50 MG tablet  Commonly known as:  ZOLOFT  Take 50 mg by mouth daily with breakfast.     spironolactone 25 MG tablet  Commonly known as:  ALDACTONE  Take 25 mg by mouth at bedtime.     SYSTANE OP  Place 1 drop into both eyes at bedtime as needed (dry eyes).     temazepam 30 MG capsule  Commonly known as:  RESTORIL  Take 30 mg by mouth at bedtime.     ZEGERID 40-1100 MG per capsule  Generic drug:  omeprazole-sodium bicarbonate  Take 1 capsule by mouth daily.       Allergies  Allergen Reactions  . Hydrochlorothiazide     Unknown  . Shellfish Allergy     HIVES  . Sulfonamide Derivatives Hives       Follow-up Information   Follow up with Londell Moh,  MD In 1 week.   Contact information:   [address] [place] [address] [phone]        The results of significant diagnostics from this hospitalization (including imaging, microbiology, ancillary and laboratory) are listed below for reference.    Significant Diagnostic Studies: No results found.  Microbiology: Recent Results (from the past 240 hour(s))  URINE CULTURE     Status: None   Collection Time    10/21/12  2:46 PM      Result Value Range Status   Specimen Description URINE, CLEAN CATCH   Final   Special Requests NONE   Final   Culture  Setup Time 10/21/2012 20:44   Final   Colony Count 25,000 COLONIES/ML   Final   Culture     Final   Value: Multiple bacterial morphotypes present, none predominant. Suggest appropriate recollection if clinically indicated.   Report Status 10/22/2012 FINAL   Final  MRSA PCR SCREENING     Status: None   Collection Time    10/22/12  8:02 AM      Result Value Range Status   MRSA by PCR NEGATIVE  NEGATIVE Final   Comment:            The GeneXpert MRSA Assay (FDA     approved for NASAL specimens     only), is one component of a     comprehensive MRSA colonization     surveillance program. It is not     intended to diagnose MRSA     infection nor to guide or     monitor treatment for     MRSA infections.     Labs: Basic Metabolic Panel:  Recent Labs Lab 10/21/12 1235  NA 141  K 3.6  CL 103  CO2 27  GLUCOSE 104*  BUN 10  CREATININE 0.54  CALCIUM 9.6   Liver Function Tests: No results found for this basename: AST, ALT, ALKPHOS, BILITOT, PROT, ALBUMIN,  in the last 168 hours No results found for this basename: LIPASE, AMYLASE,  in the last 168 hours No results found for this basename: AMMONIA,  in the last 168 hours CBC:  Recent Labs Lab 10/21/12 1235  WBC 7.0  HGB 12.2  HCT 37.7  MCV 81.8  PLT 262   Cardiac Enzymes: No results found for this basename: CKTOTAL, CKMB, CKMBINDEX, TROPONINI,   in the last 168 hours BNP: BNP (last 3 results) No results found for this basename: PROBNP,  in the last 8760 hours CBG: No results found for this basename: GLUCAP,  in the last 168 hours  SignedEddie North  [facility] 10/23/2012, 4:49 PM

## 2012-10-23 NOTE — Progress Notes (Signed)
INITIAL NUTRITION ASSESSMENT  DOCUMENTATION CODES Per approved criteria  -Obesity unspecified - Moderate malnutrition in the context of social/environmental circumstances   INTERVENTION: 1. Discussed ways to improve oral intake when d/c home - small, frequent meals, appropriate high kcal/protein snacks, etc. 2. RD to continue to follow nutrition care plan - declined oral nutrition supplements at this time  NUTRITION DIAGNOSIS: Inadequate oral intake related to depression and variable appetite as evidenced by dietary recall and recent wt loss.   Goal: Intake to meet >90% of estimated nutrition needs.  Monitor:  weight trends, lab trends, I/O's, PO intake, supplement tolerance  Reason for Assessment: Malnutrition Screening Tool  67 y.o. female  Admitting Dx: Vertigo  ASSESSMENT: Admitted with n/v and vertigo. S/p recent R knee replacement. MD planning to get MRI of brain.  Pt confirms that she has not been eating well since her husband passed away. She was started on zoloft but she reports that her intake has yet to improve. Additionally, she recently had knee surgery and has not been mobile, has not eaten well since that either. Discussed dietary recall and ways to increase oral intake. Pt states protein drinks such as Ensure give her diarrhea, and she would just like to stick to her meals at this time.  Pt meets criteria for moderate MALNUTRITION in the context of social/environmental circumstances as evidenced by 8.5% wt loss x 2 months and <75% of estimated energy requirement for at least 3 months.   Height: Ht Readings from Last 1 Encounters:  10/21/12 5\' 5"  (1.651 m)    Weight: Wt Readings from Last 1 Encounters:  10/21/12 183 lb 13.8 oz (83.4 kg)    Ideal Body Weight: 125 lb  % Ideal Body Weight: 146%  Wt Readings from Last 10 Encounters:  10/21/12 183 lb 13.8 oz (83.4 kg)  09/12/12 194 lb (87.998 kg)  09/12/12 194 lb (87.998 kg)  09/06/12 194 lb (87.998 kg)   08/20/11 199 lb 8 oz (90.493 kg)  02/17/11 204 lb (92.534 kg)  08/05/10 214 lb 4 oz (97.183 kg)  02/19/10 218 lb (98.884 kg)  11/05/09 214 lb (97.07 kg)  07/30/09 211 lb 6.4 oz (95.89 kg)    Usual Body Weight: 200  % Usual Body Weight: 91.5%  BMI:  Body mass index is 30.6 kg/(m^2). Obese Class I  Estimated Nutritional Needs: Kcal: 1500 -1650 Protein: 70 - 80 grams Fluid: at least 1.8 liters daily  Skin: intact  Diet Order: General  EDUCATION NEEDS: -Education needs addressed   Intake/Output Summary (Last 24 hours) at 10/23/12 1049 Last data filed at 10/23/12 0700  Gross per 24 hour  Intake 3278.33 ml  Output    400 ml  Net 2878.33 ml    Last BM: 5/24  Labs:   Recent Labs Lab 10/21/12 1235  NA 141  K 3.6  CL 103  CO2 27  BUN 10  CREATININE 0.54  CALCIUM 9.6  GLUCOSE 104*    CBG (last 3)  No results found for this basename: GLUCAP,  in the last 72 hours  Scheduled Meds: . cefTRIAXone (ROCEPHIN)  IV  1 g Intravenous Q24H  . enoxaparin (LOVENOX) injection  40 mg Subcutaneous Q24H  . famotidine  20 mg Oral BID  . lipase/protease/amylase  1 capsule Oral TID AC  . loratadine  10 mg Oral Daily  . meclizine  25 mg Oral TID  . pantoprazole  40 mg Oral Daily  . sertraline  50 mg Oral Q breakfast  .  spironolactone  25 mg Oral QHS  . temazepam  30 mg Oral QHS    Continuous Infusions: . sodium chloride 100 mL/hr at 10/23/12 1610    Past Medical History  Diagnosis Date  . Fibromyalgia   . Depression   . GERD (gastroesophageal reflux disease)   . Diverticulosis   . RLS (restless legs syndrome)   . Diarrhea     PROBLEMS WITH DIARRHEA  & STOMACH "LOUD NOISES" - NO PAIN --PT WAS PUT ON DRUG CREON- FEELS THAT IT HAS HELPED  . Seasonal allergies   . Asthma     PT STATES HER ASTHMA IS MILD-HAS PRO AIR INHALER TO USE IF NEEDED  . OSA (obstructive sleep apnea)     USES CPAP - SETTING 10  . Arthritis   . Anemia     SOMETIMES ANEMIC  . PONV  (postoperative nausea and vomiting)     Past Surgical History  Procedure Laterality Date  . Vaginal hysterectomy    . Bladder tacking    . Breast lumpectomy      BENIGN  . Abdominal hysterectomy    . Right knee arthroscopy    . Total knee arthroplasty Right 09/12/2012    Procedure: RIGHT TOTAL KNEE ARTHROPLASTY;  Surgeon: Loanne Drilling, MD;  Location: [facility];  Service: Orthopedics;  Laterality: Right;    Jarold Motto MS, RD, LDN Pager: [phone] After-hours pager: [phone]

## 2012-10-23 NOTE — Progress Notes (Addendum)
Physical Therapy Treatment Patient Details Name: Cassandra [name] MRN: [id number] DOB: 01-07-1946 Today's Date: 10/23/2012 Time: 1478-2956 PT Time Calculation (min): 54 min  PT Assessment / Plan / Recommendation Comments on Treatment Session  Patient continues to note dizziness with head moving up/down.  Continued vestibular testing.  Supine head roll with nystagmus to left.  Continued to note nystagmus with right gaze.  Performed Brandt-Daroff maneuver, then performed Hallpike-Dix to left with + nystagmus and dizziness. Patient with Lt BPPV.  Treated wtih Epley maneuver.  Instructed patient to keep head upright as much as possible for next 24 hours.  Will follow up this pm.    Follow Up Recommendations  Outpatient PT;Supervision/Assistance - 24 hour     Does the patient have the potential to tolerate intense rehabilitation     Barriers to Discharge        Equipment Recommendations  None recommended by PT    Recommendations for Other Services    Frequency Min 4X/week   Plan Discharge plan remains appropriate;Frequency remains appropriate    Precautions / Restrictions Precautions Precautions: Fall Restrictions Weight Bearing Restrictions: No   Pertinent Vitals/Pain     Mobility  Bed Mobility Bed Mobility: Rolling Right;Rolling Left;Right Sidelying to Sit;Sit to Supine Rolling Right: 7: Independent Rolling Left: 7: Independent Right Sidelying to Sit: 5: Supervision;With rails;HOB flat Sit to Supine: 4: Min guard;With rail;HOB elevated Details for Bed Mobility Assistance: No cues needed.  Noted 1-2 beats of nystagmus with rolling to left.  Continued to note dizziness moving supine <-> sit. Transfers Transfers: Editor, commissioning Transfers: 4: Min assist;With armrests Details for Transfer Assistance: Assist for balance to transfer to and from [facility] for safety. Ambulation/Gait Ambulation/Gait Assistance: Not tested (comment)      PT Goals Acute Rehab PT  Goals Pt will go Supine/Side to Sit: with supervision;with HOB 0 degrees PT Goal: Supine/Side to Sit - Progress: Progressing toward goal Pt will go Sit to Supine/Side: with supervision;with HOB 0 degrees PT Goal: Sit to Supine/Side - Progress: Progressing toward goal  Visit Information  Last PT Received On: 10/23/12 Assistance Needed: +1    Subjective Data  Subjective: "I feel a little better today, but I woke up several times last night dizzy"   Cognition  Cognition Arousal/Alertness: Awake/alert Behavior During Therapy: WFL for tasks assessed/performed Overall Cognitive Status: Within Functional Limits for tasks assessed    Balance     End of Session PT - End of Session Activity Tolerance: Treatment limited secondary to medical complications (Comment);Patient limited by fatigue (Treatment limited by nausea) Patient left: in bed;with call bell/phone within reach [facility] elevated) Nurse Communication: Mobility status   GP     Vena Austria 10/23/2012, 12:43 PM Durenda Hurt. Renaldo Fiddler, [facility] Acute Rehab Services Pager [phone]

## 2012-10-23 NOTE — Progress Notes (Signed)
Physical Therapy Treatment Patient Details Name: Cassandra [name] MRN: [id number] DOB: [birth year] Today's Date: 10/23/2012 Time: 0865-7846 PT Time Calculation (min): 14 min  PT Assessment / Plan / Recommendation Comments on Treatment Session  Patient improved with minimal dizziness noted.  Able to ambulate 200' without dizziness.  Able to move supine <-> sit without dizziness.  Provided patient with handouts for exercise to do at home.  Will have f/u OP PT for vestibular rehab to continue therapy.    Follow Up Recommendations  Outpatient PT;Supervision/Assistance - 24 hour     Does the patient have the potential to tolerate intense rehabilitation     Barriers to Discharge        Equipment Recommendations  None recommended by PT    Recommendations for Other Services    Frequency Min 4X/week   Plan Discharge plan remains appropriate;Frequency remains appropriate    Precautions / Restrictions Precautions Precautions: None Restrictions Weight Bearing Restrictions: No   Pertinent Vitals/Pain     Mobility  Bed Mobility Bed Mobility: Supine to Sit;Sit to Supine Rolling Right: 7: Independent Rolling Left: 7: Independent Right Sidelying to Sit: 5: Supervision;With rails;HOB flat Supine to Sit: 5: Supervision;HOB elevated Sit to Supine: 5: Supervision;HOB elevated Details for Bed Mobility Assistance: No cues needed.  Patient with no dizziness during transitions.  Moving slowly and deliberately. Transfers Transfers: Sit to Stand;Stand to Sit Sit to Stand: 5: Supervision;From bed Stand to Sit: 5: Supervision;To bed Stand Pivot Transfers: 4: Min assist;With armrests Details for Transfer Assistance: No cues needed.  Supervision for safety only.  Patient without dizziness during transfers. Ambulation/Gait Ambulation/Gait Assistance: 5: Supervision Ambulation Distance (Feet): 200 Feet Assistive device: Rolling walker Ambulation/Gait Assistance Details: Verbal cues to use visual  target during gait.  Patient with good technique.  Able to ambulate without dizziness. Gait Pattern: Step-through pattern Gait velocity: Slow gait speed, but improved from yesterday    Exercises Other Exercises Other Exercises: Provided handout and review of Brandt maneuver Other Exercises: Reviewed Epley maneuver - handout    PT Goals Acute Rehab PT Goals Pt will go Supine/Side to Sit: with supervision;with HOB 0 degrees PT Goal: Supine/Side to Sit - Progress: Met Pt will go Sit to Supine/Side: with supervision;with HOB 0 degrees PT Goal: Sit to Supine/Side - Progress: Met Pt will go Sit to Stand: with supervision;with upper extremity assist PT Goal: Sit to Stand - Progress: Met Pt will go Stand to Sit: with supervision;with upper extremity assist PT Goal: Stand to Sit - Progress: Met Pt will Ambulate: 51 - 150 feet;with supervision;with rolling walker PT Goal: Ambulate - Progress: Met Additional Goals Additional Goal #1: Patient will have good understanding of BPPV and exercise program. PT Goal: Additional Goal #1 - Progress: Goal set today  Visit Information  Last PT Received On: 10/23/12 Assistance Needed: +1    Subjective Data  Subjective: "I'm feeling better.  I only had one time that I got dizzy.  I moved my head too fast."   Cognition  Cognition Arousal/Alertness: Awake/alert Behavior During Therapy: WFL for tasks assessed/performed Overall Cognitive Status: Within Functional Limits for tasks assessed    Balance     End of Session PT - End of Session Equipment Utilized During Treatment: Gait belt Activity Tolerance: Patient tolerated treatment well Patient left: in bed;with call bell/phone within reach [facility] elevated) Nurse Communication: Mobility status   GP     Vena Austria 10/23/2012, 1:35 PM Durenda Hurt. Renaldo Fiddler, [facility] Acute Rehab Services Pager [phone]

## 2012-10-23 NOTE — Progress Notes (Signed)
Pt discharge instructions and patient education completed. IV site d/c. Site WNL. Instructed to move positions slowly due to recent vertigo. No further questions. D/C home. Cassandra [name]

## 2012-10-27 ENCOUNTER — Ambulatory Visit: Payer: 59 | Attending: Internal Medicine | Admitting: Physical Therapy

## 2012-10-27 DIAGNOSIS — IMO0001 Reserved for inherently not codable concepts without codable children: Secondary | ICD-10-CM | POA: Insufficient documentation

## 2012-10-27 DIAGNOSIS — F801 Expressive language disorder: Secondary | ICD-10-CM | POA: Insufficient documentation

## 2012-10-30 ENCOUNTER — Ambulatory Visit: Payer: 59 | Attending: Internal Medicine | Admitting: Physical Therapy

## 2012-10-30 DIAGNOSIS — IMO0001 Reserved for inherently not codable concepts without codable children: Secondary | ICD-10-CM | POA: Insufficient documentation

## 2012-10-30 DIAGNOSIS — F801 Expressive language disorder: Secondary | ICD-10-CM | POA: Insufficient documentation

## 2012-11-02 ENCOUNTER — Ambulatory Visit: Payer: 59 | Admitting: Physical Therapy

## 2012-11-06 ENCOUNTER — Ambulatory Visit: Payer: 59 | Admitting: Physical Therapy

## 2012-11-09 ENCOUNTER — Ambulatory Visit: Payer: 59 | Admitting: Physical Therapy

## 2012-11-23 ENCOUNTER — Ambulatory Visit: Payer: 59 | Admitting: Physical Therapy

## 2013-02-27 ENCOUNTER — Ambulatory Visit: Payer: 59 | Attending: Internal Medicine | Admitting: Physical Therapy

## 2013-02-27 DIAGNOSIS — H8110 Benign paroxysmal vertigo, unspecified ear: Secondary | ICD-10-CM | POA: Insufficient documentation

## 2013-02-27 DIAGNOSIS — IMO0001 Reserved for inherently not codable concepts without codable children: Secondary | ICD-10-CM | POA: Insufficient documentation

## 2013-02-27 DIAGNOSIS — R269 Unspecified abnormalities of gait and mobility: Secondary | ICD-10-CM | POA: Insufficient documentation

## 2013-03-09 ENCOUNTER — Ambulatory Visit: Payer: 59 | Attending: Internal Medicine

## 2013-03-09 DIAGNOSIS — IMO0001 Reserved for inherently not codable concepts without codable children: Secondary | ICD-10-CM | POA: Insufficient documentation

## 2013-03-09 DIAGNOSIS — R269 Unspecified abnormalities of gait and mobility: Secondary | ICD-10-CM | POA: Insufficient documentation

## 2013-03-09 DIAGNOSIS — H8110 Benign paroxysmal vertigo, unspecified ear: Secondary | ICD-10-CM | POA: Insufficient documentation

## 2013-03-14 ENCOUNTER — Ambulatory Visit: Payer: 59

## 2013-03-16 ENCOUNTER — Ambulatory Visit: Payer: 59

## 2013-03-20 ENCOUNTER — Ambulatory Visit: Payer: 59

## 2013-03-22 ENCOUNTER — Ambulatory Visit: Payer: 59 | Admitting: Physical Therapy

## 2013-03-23 ENCOUNTER — Ambulatory Visit: Payer: Medicare Other | Admitting: Neurology

## 2013-03-27 ENCOUNTER — Encounter: Payer: 59 | Admitting: Physical Therapy

## 2013-03-29 ENCOUNTER — Ambulatory Visit: Payer: 59 | Admitting: Physical Therapy

## 2013-03-29 ENCOUNTER — Ambulatory Visit: Payer: Medicare Other | Admitting: Neurology

## 2013-04-05 ENCOUNTER — Ambulatory Visit: Payer: 59 | Attending: Internal Medicine | Admitting: Physical Therapy

## 2013-04-05 DIAGNOSIS — R269 Unspecified abnormalities of gait and mobility: Secondary | ICD-10-CM | POA: Insufficient documentation

## 2013-04-05 DIAGNOSIS — H8110 Benign paroxysmal vertigo, unspecified ear: Secondary | ICD-10-CM | POA: Insufficient documentation

## 2013-04-05 DIAGNOSIS — IMO0001 Reserved for inherently not codable concepts without codable children: Secondary | ICD-10-CM | POA: Insufficient documentation

## 2013-04-09 ENCOUNTER — Encounter: Payer: 59 | Admitting: Physical Therapy

## 2013-04-09 ENCOUNTER — Ambulatory Visit: Payer: 59 | Admitting: Physical Therapy

## 2013-04-12 ENCOUNTER — Ambulatory Visit: Payer: 59 | Admitting: Physical Therapy

## 2013-04-16 ENCOUNTER — Ambulatory Visit: Payer: 59 | Admitting: Physical Therapy

## 2013-04-16 ENCOUNTER — Encounter: Payer: 59 | Admitting: Physical Therapy

## 2013-04-19 ENCOUNTER — Ambulatory Visit: Payer: 59 | Admitting: Physical Therapy

## 2013-04-24 ENCOUNTER — Ambulatory Visit: Payer: 59 | Admitting: Physical Therapy

## 2013-10-02 DIAGNOSIS — Z471 Aftercare following joint replacement surgery: Secondary | ICD-10-CM | POA: Diagnosis not present

## 2013-10-02 DIAGNOSIS — M1710 Unilateral primary osteoarthritis, unspecified knee: Secondary | ICD-10-CM | POA: Diagnosis not present

## 2013-10-02 DIAGNOSIS — M25569 Pain in unspecified knee: Secondary | ICD-10-CM | POA: Diagnosis not present

## 2013-10-02 DIAGNOSIS — Z96659 Presence of unspecified artificial knee joint: Secondary | ICD-10-CM | POA: Diagnosis not present

## 2013-10-15 DIAGNOSIS — N951 Menopausal and female climacteric states: Secondary | ICD-10-CM | POA: Diagnosis not present

## 2013-10-15 DIAGNOSIS — R079 Chest pain, unspecified: Secondary | ICD-10-CM | POA: Diagnosis not present

## 2013-10-15 DIAGNOSIS — N9489 Other specified conditions associated with female genital organs and menstrual cycle: Secondary | ICD-10-CM | POA: Diagnosis not present

## 2013-12-18 DIAGNOSIS — E538 Deficiency of other specified B group vitamins: Secondary | ICD-10-CM | POA: Diagnosis not present

## 2013-12-18 DIAGNOSIS — E785 Hyperlipidemia, unspecified: Secondary | ICD-10-CM | POA: Diagnosis not present

## 2013-12-18 DIAGNOSIS — M899 Disorder of bone, unspecified: Secondary | ICD-10-CM | POA: Diagnosis not present

## 2013-12-18 DIAGNOSIS — N390 Urinary tract infection, site not specified: Secondary | ICD-10-CM | POA: Diagnosis not present

## 2013-12-18 DIAGNOSIS — M949 Disorder of cartilage, unspecified: Secondary | ICD-10-CM | POA: Diagnosis not present

## 2013-12-18 DIAGNOSIS — K219 Gastro-esophageal reflux disease without esophagitis: Secondary | ICD-10-CM | POA: Diagnosis not present

## 2013-12-18 DIAGNOSIS — E559 Vitamin D deficiency, unspecified: Secondary | ICD-10-CM | POA: Diagnosis not present

## 2013-12-18 DIAGNOSIS — E2839 Other primary ovarian failure: Secondary | ICD-10-CM | POA: Diagnosis not present

## 2013-12-24 DIAGNOSIS — Z23 Encounter for immunization: Secondary | ICD-10-CM | POA: Diagnosis not present

## 2013-12-24 DIAGNOSIS — E785 Hyperlipidemia, unspecified: Secondary | ICD-10-CM | POA: Diagnosis not present

## 2013-12-24 DIAGNOSIS — J45909 Unspecified asthma, uncomplicated: Secondary | ICD-10-CM | POA: Diagnosis not present

## 2013-12-24 DIAGNOSIS — E559 Vitamin D deficiency, unspecified: Secondary | ICD-10-CM | POA: Diagnosis not present

## 2013-12-24 DIAGNOSIS — IMO0001 Reserved for inherently not codable concepts without codable children: Secondary | ICD-10-CM | POA: Diagnosis not present

## 2014-01-17 DIAGNOSIS — D235 Other benign neoplasm of skin of trunk: Secondary | ICD-10-CM | POA: Diagnosis not present

## 2014-01-17 DIAGNOSIS — D485 Neoplasm of uncertain behavior of skin: Secondary | ICD-10-CM | POA: Diagnosis not present

## 2014-01-17 DIAGNOSIS — L819 Disorder of pigmentation, unspecified: Secondary | ICD-10-CM | POA: Diagnosis not present

## 2014-01-17 DIAGNOSIS — L821 Other seborrheic keratosis: Secondary | ICD-10-CM | POA: Diagnosis not present

## 2014-01-17 DIAGNOSIS — L280 Lichen simplex chronicus: Secondary | ICD-10-CM | POA: Diagnosis not present

## 2014-03-15 DIAGNOSIS — Z23 Encounter for immunization: Secondary | ICD-10-CM | POA: Diagnosis not present

## 2014-04-12 DIAGNOSIS — M2540 Effusion, unspecified joint: Secondary | ICD-10-CM | POA: Diagnosis not present

## 2014-04-12 DIAGNOSIS — R197 Diarrhea, unspecified: Secondary | ICD-10-CM | POA: Diagnosis not present

## 2014-04-29 DIAGNOSIS — R197 Diarrhea, unspecified: Secondary | ICD-10-CM | POA: Diagnosis not present

## 2014-04-29 DIAGNOSIS — K5289 Other specified noninfective gastroenteritis and colitis: Secondary | ICD-10-CM | POA: Diagnosis not present

## 2014-04-29 DIAGNOSIS — K648 Other hemorrhoids: Secondary | ICD-10-CM | POA: Diagnosis not present

## 2014-04-30 DIAGNOSIS — Z1231 Encounter for screening mammogram for malignant neoplasm of breast: Secondary | ICD-10-CM | POA: Diagnosis not present

## 2015-06-04 DIAGNOSIS — M1712 Unilateral primary osteoarthritis, left knee: Secondary | ICD-10-CM | POA: Diagnosis not present

## 2015-06-11 DIAGNOSIS — M1712 Unilateral primary osteoarthritis, left knee: Secondary | ICD-10-CM | POA: Diagnosis not present

## 2015-06-23 ENCOUNTER — Encounter: Payer: Self-pay | Admitting: Gastroenterology

## 2015-08-21 DIAGNOSIS — Z90710 Acquired absence of both cervix and uterus: Secondary | ICD-10-CM | POA: Diagnosis not present

## 2015-08-21 DIAGNOSIS — N390 Urinary tract infection, site not specified: Secondary | ICD-10-CM | POA: Diagnosis not present

## 2015-08-21 DIAGNOSIS — Z124 Encounter for screening for malignant neoplasm of cervix: Secondary | ICD-10-CM | POA: Diagnosis not present

## 2015-08-21 DIAGNOSIS — Z1272 Encounter for screening for malignant neoplasm of vagina: Secondary | ICD-10-CM | POA: Diagnosis not present

## 2015-08-21 DIAGNOSIS — R350 Frequency of micturition: Secondary | ICD-10-CM | POA: Diagnosis not present

## 2015-08-21 DIAGNOSIS — Z6833 Body mass index (BMI) 33.0-33.9, adult: Secondary | ICD-10-CM | POA: Diagnosis not present

## 2015-11-14 DIAGNOSIS — R87610 Atypical squamous cells of undetermined significance on cytologic smear of cervix (ASC-US): Secondary | ICD-10-CM | POA: Diagnosis not present

## 2015-11-14 DIAGNOSIS — Z779 Other contact with and (suspected) exposures hazardous to health: Secondary | ICD-10-CM | POA: Diagnosis not present

## 2015-12-22 DIAGNOSIS — F329 Major depressive disorder, single episode, unspecified: Secondary | ICD-10-CM | POA: Diagnosis not present

## 2015-12-22 DIAGNOSIS — M545 Low back pain: Secondary | ICD-10-CM | POA: Diagnosis not present

## 2015-12-24 DIAGNOSIS — M9901 Segmental and somatic dysfunction of cervical region: Secondary | ICD-10-CM | POA: Diagnosis not present

## 2015-12-24 DIAGNOSIS — M4726 Other spondylosis with radiculopathy, lumbar region: Secondary | ICD-10-CM | POA: Diagnosis not present

## 2015-12-24 DIAGNOSIS — M9902 Segmental and somatic dysfunction of thoracic region: Secondary | ICD-10-CM | POA: Diagnosis not present

## 2015-12-24 DIAGNOSIS — M9903 Segmental and somatic dysfunction of lumbar region: Secondary | ICD-10-CM | POA: Diagnosis not present

## 2015-12-29 DIAGNOSIS — M9903 Segmental and somatic dysfunction of lumbar region: Secondary | ICD-10-CM | POA: Diagnosis not present

## 2015-12-29 DIAGNOSIS — M4726 Other spondylosis with radiculopathy, lumbar region: Secondary | ICD-10-CM | POA: Diagnosis not present

## 2015-12-29 DIAGNOSIS — M9901 Segmental and somatic dysfunction of cervical region: Secondary | ICD-10-CM | POA: Diagnosis not present

## 2015-12-29 DIAGNOSIS — M9902 Segmental and somatic dysfunction of thoracic region: Secondary | ICD-10-CM | POA: Diagnosis not present

## 2015-12-30 DIAGNOSIS — L820 Inflamed seborrheic keratosis: Secondary | ICD-10-CM | POA: Diagnosis not present

## 2015-12-30 DIAGNOSIS — L814 Other melanin hyperpigmentation: Secondary | ICD-10-CM | POA: Diagnosis not present

## 2015-12-30 DIAGNOSIS — M4726 Other spondylosis with radiculopathy, lumbar region: Secondary | ICD-10-CM | POA: Diagnosis not present

## 2015-12-30 DIAGNOSIS — M9901 Segmental and somatic dysfunction of cervical region: Secondary | ICD-10-CM | POA: Diagnosis not present

## 2015-12-30 DIAGNOSIS — D225 Melanocytic nevi of trunk: Secondary | ICD-10-CM | POA: Diagnosis not present

## 2015-12-30 DIAGNOSIS — L821 Other seborrheic keratosis: Secondary | ICD-10-CM | POA: Diagnosis not present

## 2015-12-30 DIAGNOSIS — M9903 Segmental and somatic dysfunction of lumbar region: Secondary | ICD-10-CM | POA: Diagnosis not present

## 2015-12-30 DIAGNOSIS — M9902 Segmental and somatic dysfunction of thoracic region: Secondary | ICD-10-CM | POA: Diagnosis not present

## 2015-12-31 DIAGNOSIS — M9903 Segmental and somatic dysfunction of lumbar region: Secondary | ICD-10-CM | POA: Diagnosis not present

## 2015-12-31 DIAGNOSIS — M9901 Segmental and somatic dysfunction of cervical region: Secondary | ICD-10-CM | POA: Diagnosis not present

## 2015-12-31 DIAGNOSIS — M9902 Segmental and somatic dysfunction of thoracic region: Secondary | ICD-10-CM | POA: Diagnosis not present

## 2015-12-31 DIAGNOSIS — M4726 Other spondylosis with radiculopathy, lumbar region: Secondary | ICD-10-CM | POA: Diagnosis not present

## 2016-01-01 DIAGNOSIS — M179 Osteoarthritis of knee, unspecified: Secondary | ICD-10-CM | POA: Diagnosis not present

## 2016-01-05 DIAGNOSIS — H04123 Dry eye syndrome of bilateral lacrimal glands: Secondary | ICD-10-CM | POA: Diagnosis not present

## 2016-01-05 DIAGNOSIS — H52223 Regular astigmatism, bilateral: Secondary | ICD-10-CM | POA: Diagnosis not present

## 2016-01-05 DIAGNOSIS — N390 Urinary tract infection, site not specified: Secondary | ICD-10-CM | POA: Diagnosis not present

## 2016-01-05 DIAGNOSIS — M9903 Segmental and somatic dysfunction of lumbar region: Secondary | ICD-10-CM | POA: Diagnosis not present

## 2016-01-05 DIAGNOSIS — E559 Vitamin D deficiency, unspecified: Secondary | ICD-10-CM | POA: Diagnosis not present

## 2016-01-05 DIAGNOSIS — E538 Deficiency of other specified B group vitamins: Secondary | ICD-10-CM | POA: Diagnosis not present

## 2016-01-05 DIAGNOSIS — H2513 Age-related nuclear cataract, bilateral: Secondary | ICD-10-CM | POA: Diagnosis not present

## 2016-01-05 DIAGNOSIS — Z0000 Encounter for general adult medical examination without abnormal findings: Secondary | ICD-10-CM | POA: Diagnosis not present

## 2016-01-05 DIAGNOSIS — M9902 Segmental and somatic dysfunction of thoracic region: Secondary | ICD-10-CM | POA: Diagnosis not present

## 2016-01-05 DIAGNOSIS — M4726 Other spondylosis with radiculopathy, lumbar region: Secondary | ICD-10-CM | POA: Diagnosis not present

## 2016-01-05 DIAGNOSIS — E785 Hyperlipidemia, unspecified: Secondary | ICD-10-CM | POA: Diagnosis not present

## 2016-01-05 DIAGNOSIS — M9901 Segmental and somatic dysfunction of cervical region: Secondary | ICD-10-CM | POA: Diagnosis not present

## 2016-01-05 DIAGNOSIS — H5203 Hypermetropia, bilateral: Secondary | ICD-10-CM | POA: Diagnosis not present

## 2016-01-05 DIAGNOSIS — H524 Presbyopia: Secondary | ICD-10-CM | POA: Diagnosis not present

## 2016-01-06 DIAGNOSIS — M9903 Segmental and somatic dysfunction of lumbar region: Secondary | ICD-10-CM | POA: Diagnosis not present

## 2016-01-06 DIAGNOSIS — M4726 Other spondylosis with radiculopathy, lumbar region: Secondary | ICD-10-CM | POA: Diagnosis not present

## 2016-01-06 DIAGNOSIS — M9902 Segmental and somatic dysfunction of thoracic region: Secondary | ICD-10-CM | POA: Diagnosis not present

## 2016-01-06 DIAGNOSIS — M9901 Segmental and somatic dysfunction of cervical region: Secondary | ICD-10-CM | POA: Diagnosis not present

## 2016-01-07 DIAGNOSIS — M9901 Segmental and somatic dysfunction of cervical region: Secondary | ICD-10-CM | POA: Diagnosis not present

## 2016-01-07 DIAGNOSIS — M4726 Other spondylosis with radiculopathy, lumbar region: Secondary | ICD-10-CM | POA: Diagnosis not present

## 2016-01-07 DIAGNOSIS — M9902 Segmental and somatic dysfunction of thoracic region: Secondary | ICD-10-CM | POA: Diagnosis not present

## 2016-01-07 DIAGNOSIS — M9903 Segmental and somatic dysfunction of lumbar region: Secondary | ICD-10-CM | POA: Diagnosis not present

## 2016-01-08 DIAGNOSIS — G4700 Insomnia, unspecified: Secondary | ICD-10-CM | POA: Diagnosis not present

## 2016-01-08 DIAGNOSIS — M8580 Other specified disorders of bone density and structure, unspecified site: Secondary | ICD-10-CM | POA: Diagnosis not present

## 2016-01-08 DIAGNOSIS — M859 Disorder of bone density and structure, unspecified: Secondary | ICD-10-CM | POA: Diagnosis not present

## 2016-01-08 DIAGNOSIS — F329 Major depressive disorder, single episode, unspecified: Secondary | ICD-10-CM | POA: Diagnosis not present

## 2016-01-08 DIAGNOSIS — Z0000 Encounter for general adult medical examination without abnormal findings: Secondary | ICD-10-CM | POA: Diagnosis not present

## 2016-01-12 DIAGNOSIS — M8580 Other specified disorders of bone density and structure, unspecified site: Secondary | ICD-10-CM | POA: Diagnosis not present

## 2016-01-12 DIAGNOSIS — M4726 Other spondylosis with radiculopathy, lumbar region: Secondary | ICD-10-CM | POA: Diagnosis not present

## 2016-01-12 DIAGNOSIS — M9901 Segmental and somatic dysfunction of cervical region: Secondary | ICD-10-CM | POA: Diagnosis not present

## 2016-01-12 DIAGNOSIS — M9903 Segmental and somatic dysfunction of lumbar region: Secondary | ICD-10-CM | POA: Diagnosis not present

## 2016-01-12 DIAGNOSIS — M9902 Segmental and somatic dysfunction of thoracic region: Secondary | ICD-10-CM | POA: Diagnosis not present

## 2016-01-13 DIAGNOSIS — M4726 Other spondylosis with radiculopathy, lumbar region: Secondary | ICD-10-CM | POA: Diagnosis not present

## 2016-01-13 DIAGNOSIS — M9903 Segmental and somatic dysfunction of lumbar region: Secondary | ICD-10-CM | POA: Diagnosis not present

## 2016-01-13 DIAGNOSIS — M9902 Segmental and somatic dysfunction of thoracic region: Secondary | ICD-10-CM | POA: Diagnosis not present

## 2016-01-13 DIAGNOSIS — M9901 Segmental and somatic dysfunction of cervical region: Secondary | ICD-10-CM | POA: Diagnosis not present

## 2016-01-14 DIAGNOSIS — M9902 Segmental and somatic dysfunction of thoracic region: Secondary | ICD-10-CM | POA: Diagnosis not present

## 2016-01-14 DIAGNOSIS — M9901 Segmental and somatic dysfunction of cervical region: Secondary | ICD-10-CM | POA: Diagnosis not present

## 2016-01-14 DIAGNOSIS — M4726 Other spondylosis with radiculopathy, lumbar region: Secondary | ICD-10-CM | POA: Diagnosis not present

## 2016-01-14 DIAGNOSIS — M9903 Segmental and somatic dysfunction of lumbar region: Secondary | ICD-10-CM | POA: Diagnosis not present

## 2016-01-19 DIAGNOSIS — M9901 Segmental and somatic dysfunction of cervical region: Secondary | ICD-10-CM | POA: Diagnosis not present

## 2016-01-19 DIAGNOSIS — M4726 Other spondylosis with radiculopathy, lumbar region: Secondary | ICD-10-CM | POA: Diagnosis not present

## 2016-01-19 DIAGNOSIS — M9903 Segmental and somatic dysfunction of lumbar region: Secondary | ICD-10-CM | POA: Diagnosis not present

## 2016-01-19 DIAGNOSIS — M9902 Segmental and somatic dysfunction of thoracic region: Secondary | ICD-10-CM | POA: Diagnosis not present

## 2016-01-20 DIAGNOSIS — M9903 Segmental and somatic dysfunction of lumbar region: Secondary | ICD-10-CM | POA: Diagnosis not present

## 2016-01-20 DIAGNOSIS — M9902 Segmental and somatic dysfunction of thoracic region: Secondary | ICD-10-CM | POA: Diagnosis not present

## 2016-01-20 DIAGNOSIS — M9901 Segmental and somatic dysfunction of cervical region: Secondary | ICD-10-CM | POA: Diagnosis not present

## 2016-01-20 DIAGNOSIS — M4726 Other spondylosis with radiculopathy, lumbar region: Secondary | ICD-10-CM | POA: Diagnosis not present

## 2016-01-21 DIAGNOSIS — M9902 Segmental and somatic dysfunction of thoracic region: Secondary | ICD-10-CM | POA: Diagnosis not present

## 2016-01-21 DIAGNOSIS — M9901 Segmental and somatic dysfunction of cervical region: Secondary | ICD-10-CM | POA: Diagnosis not present

## 2016-01-21 DIAGNOSIS — M9903 Segmental and somatic dysfunction of lumbar region: Secondary | ICD-10-CM | POA: Diagnosis not present

## 2016-01-21 DIAGNOSIS — M4726 Other spondylosis with radiculopathy, lumbar region: Secondary | ICD-10-CM | POA: Diagnosis not present

## 2016-02-10 DIAGNOSIS — Z471 Aftercare following joint replacement surgery: Secondary | ICD-10-CM | POA: Diagnosis not present

## 2016-02-10 DIAGNOSIS — Z96651 Presence of right artificial knee joint: Secondary | ICD-10-CM | POA: Diagnosis not present

## 2016-02-10 DIAGNOSIS — M179 Osteoarthritis of knee, unspecified: Secondary | ICD-10-CM | POA: Diagnosis not present

## 2016-02-13 DIAGNOSIS — Z23 Encounter for immunization: Secondary | ICD-10-CM | POA: Diagnosis not present

## 2016-05-06 DIAGNOSIS — R300 Dysuria: Secondary | ICD-10-CM | POA: Diagnosis not present

## 2016-05-10 DIAGNOSIS — R350 Frequency of micturition: Secondary | ICD-10-CM | POA: Diagnosis not present

## 2016-05-10 DIAGNOSIS — R3915 Urgency of urination: Secondary | ICD-10-CM | POA: Diagnosis not present

## 2016-05-10 DIAGNOSIS — R339 Retention of urine, unspecified: Secondary | ICD-10-CM | POA: Diagnosis not present

## 2016-05-10 DIAGNOSIS — R102 Pelvic and perineal pain: Secondary | ICD-10-CM | POA: Diagnosis not present

## 2016-05-19 DIAGNOSIS — J019 Acute sinusitis, unspecified: Secondary | ICD-10-CM | POA: Diagnosis not present

## 2016-06-02 DIAGNOSIS — R300 Dysuria: Secondary | ICD-10-CM | POA: Diagnosis not present

## 2016-06-02 DIAGNOSIS — N390 Urinary tract infection, site not specified: Secondary | ICD-10-CM | POA: Diagnosis not present

## 2016-06-07 DIAGNOSIS — L299 Pruritus, unspecified: Secondary | ICD-10-CM | POA: Diagnosis not present

## 2016-06-07 DIAGNOSIS — R21 Rash and other nonspecific skin eruption: Secondary | ICD-10-CM | POA: Diagnosis not present

## 2016-06-22 DIAGNOSIS — R102 Pelvic and perineal pain: Secondary | ICD-10-CM | POA: Diagnosis not present

## 2016-06-22 DIAGNOSIS — N393 Stress incontinence (female) (male): Secondary | ICD-10-CM | POA: Diagnosis not present

## 2016-06-28 ENCOUNTER — Other Ambulatory Visit: Payer: Self-pay | Admitting: Urology

## 2016-07-07 ENCOUNTER — Encounter (HOSPITAL_BASED_OUTPATIENT_CLINIC_OR_DEPARTMENT_OTHER): Payer: Self-pay | Admitting: *Deleted

## 2016-07-07 NOTE — Progress Notes (Signed)
To [facility] at 1000-Hg on arrival-Instructed Npo after  Mn-will take allegra,zantac in am with small amt water.

## 2016-07-08 DIAGNOSIS — G4700 Insomnia, unspecified: Secondary | ICD-10-CM | POA: Diagnosis not present

## 2016-07-08 DIAGNOSIS — F329 Major depressive disorder, single episode, unspecified: Secondary | ICD-10-CM | POA: Diagnosis not present

## 2016-07-12 ENCOUNTER — Encounter (HOSPITAL_BASED_OUTPATIENT_CLINIC_OR_DEPARTMENT_OTHER)
Admission: RE | Disposition: A | Discharge status: Home / Self Care | Payer: Self-pay | Source: Ambulatory Visit | Attending: Urology

## 2016-07-12 ENCOUNTER — Encounter (HOSPITAL_BASED_OUTPATIENT_CLINIC_OR_DEPARTMENT_OTHER): Payer: Self-pay | Admitting: *Deleted

## 2016-07-12 ENCOUNTER — Ambulatory Visit (HOSPITAL_BASED_OUTPATIENT_CLINIC_OR_DEPARTMENT_OTHER): Payer: PPO | Admitting: Anesthesiology

## 2016-07-12 ENCOUNTER — Ambulatory Visit (HOSPITAL_BASED_OUTPATIENT_CLINIC_OR_DEPARTMENT_OTHER)
Admission: RE | Admit: 2016-07-12 | Discharge: 2016-07-12 | Disposition: A | Discharge status: Home / Self Care | Payer: PPO | Source: Ambulatory Visit | Attending: Urology | Admitting: Urology

## 2016-07-12 DIAGNOSIS — K219 Gastro-esophageal reflux disease without esophagitis: Secondary | ICD-10-CM | POA: Insufficient documentation

## 2016-07-12 DIAGNOSIS — Z79818 Long term (current) use of other agents affecting estrogen receptors and estrogen levels: Secondary | ICD-10-CM | POA: Diagnosis not present

## 2016-07-12 DIAGNOSIS — Y738 Miscellaneous gastroenterology and urology devices associated with adverse incidents, not elsewhere classified: Secondary | ICD-10-CM | POA: Insufficient documentation

## 2016-07-12 DIAGNOSIS — Z9889 Other specified postprocedural states: Secondary | ICD-10-CM | POA: Insufficient documentation

## 2016-07-12 DIAGNOSIS — J449 Chronic obstructive pulmonary disease, unspecified: Secondary | ICD-10-CM | POA: Diagnosis not present

## 2016-07-12 DIAGNOSIS — Z882 Allergy status to sulfonamides status: Secondary | ICD-10-CM | POA: Insufficient documentation

## 2016-07-12 DIAGNOSIS — G4733 Obstructive sleep apnea (adult) (pediatric): Secondary | ICD-10-CM | POA: Insufficient documentation

## 2016-07-12 DIAGNOSIS — Z888 Allergy status to other drugs, medicaments and biological substances status: Secondary | ICD-10-CM | POA: Diagnosis not present

## 2016-07-12 DIAGNOSIS — Z91013 Allergy to seafood: Secondary | ICD-10-CM | POA: Insufficient documentation

## 2016-07-12 DIAGNOSIS — Y848 Other medical procedures as the cause of abnormal reaction of the patient, or of later complication, without mention of misadventure at the time of the procedure: Secondary | ICD-10-CM | POA: Diagnosis not present

## 2016-07-12 DIAGNOSIS — R102 Pelvic and perineal pain: Secondary | ICD-10-CM | POA: Diagnosis not present

## 2016-07-12 DIAGNOSIS — Z96651 Presence of right artificial knee joint: Secondary | ICD-10-CM | POA: Diagnosis not present

## 2016-07-12 DIAGNOSIS — T83721A Exposure of implanted vaginal mesh and other prosthetic materials into vagina, initial encounter: Secondary | ICD-10-CM | POA: Diagnosis not present

## 2016-07-12 DIAGNOSIS — R3129 Other microscopic hematuria: Secondary | ICD-10-CM | POA: Diagnosis not present

## 2016-07-12 DIAGNOSIS — Z90710 Acquired absence of both cervix and uterus: Secondary | ICD-10-CM | POA: Diagnosis not present

## 2016-07-12 DIAGNOSIS — T83711A Erosion of implanted vaginal mesh and other prosthetic materials to surrounding organ or tissue, initial encounter: Secondary | ICD-10-CM | POA: Diagnosis not present

## 2016-07-12 DIAGNOSIS — F329 Major depressive disorder, single episode, unspecified: Secondary | ICD-10-CM | POA: Insufficient documentation

## 2016-07-12 DIAGNOSIS — Z8051 Family history of malignant neoplasm of kidney: Secondary | ICD-10-CM | POA: Diagnosis not present

## 2016-07-12 DIAGNOSIS — M797 Fibromyalgia: Secondary | ICD-10-CM | POA: Insufficient documentation

## 2016-07-12 DIAGNOSIS — N210 Calculus in bladder: Secondary | ICD-10-CM | POA: Insufficient documentation

## 2016-07-12 DIAGNOSIS — Z833 Family history of diabetes mellitus: Secondary | ICD-10-CM | POA: Diagnosis not present

## 2016-07-12 DIAGNOSIS — G8929 Other chronic pain: Secondary | ICD-10-CM | POA: Diagnosis not present

## 2016-07-12 HISTORY — PX: CYSTOSCOPY: SHX5120

## 2016-07-12 HISTORY — PX: HOLMIUM LASER APPLICATION: SHX5852

## 2016-07-12 LAB — POCT HEMOGLOBIN-HEMACUE: Hemoglobin: 14.6 g/dL (ref 12.0–15.0)

## 2016-07-12 SURGERY — CYSTOSCOPY
Anesthesia: General | Site: Bladder

## 2016-07-12 MED ORDER — PROPOFOL 10 MG/ML IV BOLUS
INTRAVENOUS | Status: AC
  Filled 2016-07-12: qty 20

## 2016-07-12 MED ORDER — SCOPOLAMINE 1 MG/3DAYS TD PT72
1.0000 | MEDICATED_PATCH | Freq: Once | TRANSDERMAL | Status: DC
  Administered 2016-07-12: 1.5 mg via TRANSDERMAL
  Filled 2016-07-12: qty 1

## 2016-07-12 MED ORDER — LACTATED RINGERS IV SOLN
INTRAVENOUS | Status: DC
  Administered 2016-07-12: 11:00:00 via INTRAVENOUS
  Filled 2016-07-12: qty 1000

## 2016-07-12 MED ORDER — MIDAZOLAM HCL 5 MG/5ML IJ SOLN
INTRAMUSCULAR | Status: DC | PRN
  Administered 2016-07-12 (×2): 1 mg via INTRAVENOUS

## 2016-07-12 MED ORDER — FENTANYL CITRATE (PF) 100 MCG/2ML IJ SOLN
25.0000 ug | INTRAMUSCULAR | Status: DC | PRN
  Filled 2016-07-12: qty 1

## 2016-07-12 MED ORDER — EPHEDRINE 5 MG/ML INJ
INTRAVENOUS | Status: AC
  Filled 2016-07-12: qty 10

## 2016-07-12 MED ORDER — SUCCINYLCHOLINE CHLORIDE 200 MG/10ML IV SOSY
PREFILLED_SYRINGE | INTRAVENOUS | Status: DC | PRN
  Administered 2016-07-12: 120 mg via INTRAVENOUS

## 2016-07-12 MED ORDER — LIDOCAINE 2% (20 MG/ML) 5 ML SYRINGE
INTRAMUSCULAR | Status: AC
  Filled 2016-07-12: qty 5

## 2016-07-12 MED ORDER — CEFAZOLIN SODIUM-DEXTROSE 2-4 GM/100ML-% IV SOLN
2.0000 g | INTRAVENOUS | Status: AC
  Administered 2016-07-12: 2 g via INTRAVENOUS
  Filled 2016-07-12: qty 100

## 2016-07-12 MED ORDER — MIDAZOLAM HCL 2 MG/2ML IJ SOLN
0.5000 mg | Freq: Once | INTRAMUSCULAR | Status: DC | PRN
  Filled 2016-07-12: qty 2

## 2016-07-12 MED ORDER — CEFAZOLIN SODIUM-DEXTROSE 2-4 GM/100ML-% IV SOLN
INTRAVENOUS | Status: AC
  Filled 2016-07-12: qty 100

## 2016-07-12 MED ORDER — MIDAZOLAM HCL 2 MG/2ML IJ SOLN
INTRAMUSCULAR | Status: AC
  Filled 2016-07-12: qty 2

## 2016-07-12 MED ORDER — ONDANSETRON HCL 4 MG/2ML IJ SOLN
INTRAMUSCULAR | Status: AC
  Filled 2016-07-12: qty 2

## 2016-07-12 MED ORDER — SCOPOLAMINE 1 MG/3DAYS TD PT72
MEDICATED_PATCH | TRANSDERMAL | Status: AC
  Filled 2016-07-12: qty 1

## 2016-07-12 MED ORDER — DEXAMETHASONE SODIUM PHOSPHATE 10 MG/ML IJ SOLN
INTRAMUSCULAR | Status: AC
  Filled 2016-07-12: qty 1

## 2016-07-12 MED ORDER — PROMETHAZINE HCL 25 MG/ML IJ SOLN
6.2500 mg | INTRAMUSCULAR | Status: DC | PRN
  Filled 2016-07-12: qty 1

## 2016-07-12 MED ORDER — TRAMADOL HCL 50 MG PO TABS: 50.0000 mg | ORAL_TABLET | Freq: Four times a day (QID) | ORAL | 0 refills | Status: DC | PRN

## 2016-07-12 MED ORDER — LIDOCAINE 2% (20 MG/ML) 5 ML SYRINGE
INTRAMUSCULAR | Status: DC | PRN
  Administered 2016-07-12: 60 mg via INTRAVENOUS

## 2016-07-12 MED ORDER — PROPOFOL 10 MG/ML IV BOLUS
INTRAVENOUS | Status: DC | PRN
  Administered 2016-07-12: 150 mg via INTRAVENOUS
  Administered 2016-07-12: 50 mg via INTRAVENOUS

## 2016-07-12 MED ORDER — FENTANYL CITRATE (PF) 100 MCG/2ML IJ SOLN
INTRAMUSCULAR | Status: DC | PRN
  Administered 2016-07-12 (×2): 50 ug via INTRAVENOUS

## 2016-07-12 MED ORDER — DEXAMETHASONE SODIUM PHOSPHATE 4 MG/ML IJ SOLN
INTRAMUSCULAR | Status: DC | PRN
  Administered 2016-07-12: 10 mg via INTRAVENOUS

## 2016-07-12 MED ORDER — STERILE WATER FOR IRRIGATION IR SOLN
Status: DC | PRN
  Administered 2016-07-12: 3000 mL

## 2016-07-12 MED ORDER — ONDANSETRON HCL 4 MG/2ML IJ SOLN
INTRAMUSCULAR | Status: DC | PRN
  Administered 2016-07-12: 4 mg via INTRAVENOUS

## 2016-07-12 MED ORDER — MEPERIDINE HCL 25 MG/ML IJ SOLN
6.2500 mg | INTRAMUSCULAR | Status: DC | PRN
  Filled 2016-07-12: qty 1

## 2016-07-12 MED ORDER — SUCCINYLCHOLINE CHLORIDE 200 MG/10ML IV SOSY
PREFILLED_SYRINGE | INTRAVENOUS | Status: AC
  Filled 2016-07-12: qty 10

## 2016-07-12 MED ORDER — CEFAZOLIN IN D5W 1 GM/50ML IV SOLN
1.0000 g | INTRAVENOUS | Status: DC
  Filled 2016-07-12: qty 50

## 2016-07-12 MED ORDER — OXYCODONE-ACETAMINOPHEN 5-325 MG PO TABS: 1.0000 | ORAL_TABLET | ORAL | 0 refills | Status: DC | PRN

## 2016-07-12 MED ORDER — EPHEDRINE SULFATE-NACL 50-0.9 MG/10ML-% IV SOSY
PREFILLED_SYRINGE | INTRAVENOUS | Status: DC | PRN
  Administered 2016-07-12: 10 mg via INTRAVENOUS

## 2016-07-12 MED ORDER — SODIUM CHLORIDE 0.9 % IR SOLN
Status: DC | PRN
  Administered 2016-07-12: 4000 mL

## 2016-07-12 MED ORDER — FENTANYL CITRATE (PF) 100 MCG/2ML IJ SOLN
INTRAMUSCULAR | Status: AC
  Filled 2016-07-12: qty 2

## 2016-07-12 SURGICAL SUPPLY — 35 items
ADAPTER CATH WHT DISP STRL (CATHETERS) IMPLANT
ADPR CATH MP STRL LF DISP BD (CATHETERS)
BAG DRAIN URO-CYSTO SKYTR STRL (DRAIN) ×3 IMPLANT
BAG DRN UROCATH (DRAIN) ×2
BAG URINE DRAINAGE (UROLOGICAL SUPPLIES) ×1 IMPLANT
BOOTIES KNEE HIGH SLOAN (MISCELLANEOUS) ×3 IMPLANT
CATH FOLEY 3WAY 30CC 22F (CATHETERS) ×1 IMPLANT
CATH ROBINSON RED A/P 16FR (CATHETERS) IMPLANT
CLOTH BEACON ORANGE TIMEOUT ST (SAFETY) ×3 IMPLANT
ELECT REM PT RETURN 9FT ADLT (ELECTROSURGICAL)
ELECTRODE REM PT RTRN 9FT ADLT (ELECTROSURGICAL) ×2 IMPLANT
FIBER LASER FLEXIVA 1000 (UROLOGICAL SUPPLIES) ×1 IMPLANT
GLOVE BIO SURGEON STRL SZ8 (GLOVE) ×1 IMPLANT
GLOVE BIOGEL PI IND STRL 7.5 (GLOVE) IMPLANT
GLOVE BIOGEL PI INDICATOR 7.5 (GLOVE) ×2
GOWN STRL REUS W/ TWL LRG LVL3 (GOWN DISPOSABLE) ×2 IMPLANT
GOWN STRL REUS W/ TWL XL LVL3 (GOWN DISPOSABLE) ×2 IMPLANT
GOWN STRL REUS W/TWL LRG LVL3 (GOWN DISPOSABLE) ×3
GOWN STRL REUS W/TWL XL LVL3 (GOWN DISPOSABLE) ×3
HOLDER FOLEY CATH W/STRAP (MISCELLANEOUS) ×1 IMPLANT
KIT ROOM TURNOVER WOR (KITS) ×3 IMPLANT
LOOP CUT BIPOLAR 24F LRG (ELECTROSURGICAL) ×1 IMPLANT
MANIFOLD NEPTUNE II (INSTRUMENTS) ×3 IMPLANT
NDL SAFETY ECLIPSE 18X1.5 (NEEDLE) ×2 IMPLANT
NDL SPNL 22GX7 QUINCKE BK (NEEDLE) IMPLANT
NEEDLE HYPO 18GX1.5 SHARP (NEEDLE) ×3
NEEDLE HYPO 22GX1.5 SAFETY (NEEDLE) IMPLANT
NEEDLE SPNL 22GX7 QUINCKE BK (NEEDLE) IMPLANT
NS IRRIG 500ML POUR BTL (IV SOLUTION) IMPLANT
PACK CYSTO (CUSTOM PROCEDURE TRAY) ×3 IMPLANT
SYR 20CC LL (SYRINGE) ×4 IMPLANT
SYR 30ML LL (SYRINGE) ×1 IMPLANT
SYR BULB IRRIGATION 50ML (SYRINGE) IMPLANT
TUBE CONNECTING 12X1/4 (SUCTIONS) ×1 IMPLANT
WATER STERILE IRR 3000ML UROMA (IV SOLUTION) ×3 IMPLANT

## 2016-07-12 NOTE — H&P (Signed)
Urology Admission H&P  Chief Complaint: pelvic pain  History of Present Illness: [name] is a [phone] with a hx of anterior and posterior repair with mesh who has chronic pelvic pain. There was difficulty examining the patient in the office but she was noted to have a firm, indurated area at the apex of her vagina concerning for mesh extrusion  Past Medical History:  Diagnosis Date  . Anemia    SOMETIMES ANEMIC  . Arthritis   . Asthma    PT STATES HER ASTHMA IS MILD-HAS PRO AIR INHALER TO USE IF NEEDED  . Depression   . Diarrhea    PROBLEMS WITH DIARRHEA  & STOMACH "LOUD NOISES" - NO PAIN --PT WAS PUT ON DRUG CREON- FEELS THAT IT HAS HELPED  . Diverticulosis   . Fibromyalgia   . GERD (gastroesophageal reflux disease)   . OSA (obstructive sleep apnea)    hasn't used in several years  . PONV (postoperative nausea and vomiting)   . RLS (restless legs syndrome)   . Seasonal allergies    Past Surgical History:  Procedure Laterality Date  . ABDOMINAL HYSTERECTOMY    . BLADDER SUSPENSION  1990's  . bladder tacking  2010   with mesh  . BREAST LUMPECTOMY Left 1985   Benign cyst  . RIGHT KNEE ARTHROSCOPY    . TOTAL KNEE ARTHROPLASTY Right 09/12/2012   Procedure: RIGHT TOTAL KNEE ARTHROPLASTY;  Surgeon: Gearlean Alf, MD;  Location: [facility];  Service: Orthopedics;  Laterality: Right;  Marland Kitchen VAGINAL HYSTERECTOMY  1984    Home Medications:  Prescriptions Prior to Admission  Medication Sig Dispense Refill Last Dose  . fexofenadine (ALLEGRA) 30 MG tablet Take 30 mg by mouth daily.   07/12/2016 at 0830  . omeprazole-sodium bicarbonate (ZEGERID) 40-1100 MG per capsule Take 1 capsule by mouth daily.    07/12/2016 at 0830  . ranitidine (ZANTAC) 150 MG capsule Take 150 mg by mouth 2 (two) times daily.    Past Month at Unknown time  . spironolactone (ALDACTONE) 25 MG tablet Take 25 mg by mouth at bedtime. Doesn't take daily   Past Month at Unknown time  . zolpidem (AMBIEN) 5 MG tablet Take 5 mg by  mouth at bedtime as needed for sleep.   07/11/2016 at Unknown time  . acetaminophen (TYLENOL) 500 MG tablet Take 500 mg by mouth every 4 (four) hours as needed.    More than a month at Unknown time  . albuterol (PROVENTIL HFA;VENTOLIN HFA) 108 (90 BASE) MCG/ACT inhaler Inhale 2 puffs into the lungs every 6 (six) hours as needed for wheezing.   More than a month at Unknown time  . cetirizine (ZYRTEC) 10 MG tablet Take 10 mg by mouth daily.    10/20/2012 at Unknown  . estradiol (ESTRACE) 0.1 MG/GM vaginal cream Place 1 Applicatorful vaginally at bedtime.   More than a month at Unknown time  . meclizine (ANTIVERT) 25 MG tablet Take 1 tablet (25 mg total) by mouth 3 (three) times daily as needed for dizziness. 30 tablet 0 Unknown at Unknown time  . methocarbamol (ROBAXIN) 500 MG tablet Take 1 tablet (500 mg total) by mouth every 6 (six) hours as needed. 80 tablet 0 Unknown at Unknown time  . ondansetron (ZOFRAN) 4 MG tablet Take 1 tablet (4 mg total) by mouth every 6 (six) hours as needed for nausea. 20 tablet 0   . Polyethyl Glycol-Propyl Glycol (SYSTANE OP) Place 1 drop into both eyes at bedtime as needed (  dry eyes).    More than a month at Unknown time  . sertraline (ZOLOFT) 50 MG tablet Take 50 mg by mouth daily with breakfast.    10/20/2012 at Unknown  . temazepam (RESTORIL) 30 MG capsule Take 30 mg by mouth at bedtime.   10/20/2012 at Unknown   Allergies:  Allergies  Allergen Reactions  . Shellfish Allergy     HIVES  . Sulfonamide Derivatives Hives  . Hydrochlorothiazide Hives    Unknown    Family History  Problem Relation Age of Onset  . Kidney cancer Mother   . Diabetes Mother    Social History:  reports that she has never smoked. She has never used smokeless tobacco. She reports that she does not drink alcohol or use drugs.  Review of Systems  Genitourinary: Positive for dysuria, flank pain, frequency and urgency.  All other systems reviewed and are negative.   Physical Exam:   Vital signs in last 24 hours: Temp:  [97.5 F (36.4 C)] 97.5 F (36.4 C) (02/12 1043) Pulse Rate:  [74] 74 (02/12 1043) Resp:  [18] 18 (02/12 1043) BP: (124)/(73) 124/73 (02/12 1043) SpO2:  [99 %] 99 % (02/12 1043) Weight:  [92.1 kg (203 lb)] 92.1 kg (203 lb) (02/12 1043) Physical Exam  Constitutional: She is oriented to person, place, and time. She appears well-developed and well-nourished.  HENT:  Head: Normocephalic and atraumatic.  Eyes: EOM are normal. Pupils are equal, round, and reactive to light.  Neck: Normal range of motion. No thyromegaly present.  Cardiovascular: Normal rate and regular rhythm.   Respiratory: Effort normal. No respiratory distress.  GI: Soft. She exhibits no distension.  Musculoskeletal: Normal range of motion. She exhibits no edema.  Neurological: She is alert and oriented to person, place, and time.  Skin: Skin is warm and dry.  Psychiatric: She has a normal mood and affect. Her behavior is normal. Judgment and thought content normal.    Laboratory Data:  Results for orders placed or performed during the hospital encounter of 07/12/16 (from the past 24 hour(s))  Hemoglobin-hemacue, POC     Status: None   Collection Time: 07/12/16 11:26 AM  Result Value Ref Range   Hemoglobin 14.6 12.0 - 15.0 g/dL   No results found for this or any previous visit (from the past 240 hour(s)). Creatinine: No results for input(s): CREATININE in the last 168 hours. Baseline Creatinine: unknown  Impression/Assessment:  70yo with pelvic pain and possible mesh extrusion/erosoion  Plan:  The risks/benefits/alternatives to cystoscopy and vaginoscopy was explained to the patient and she understands and wishes to proceed with surgery  Nicolette Bang 07/12/2016, 11:44 AM

## 2016-07-12 NOTE — Transfer of Care (Signed)
Immediate Anesthesia Transfer of Care Note  Patient: Cassandra [name]  Procedure(s) Performed: Procedure(s) (LRB): CYSTOSCOPY, VAGINOSCOPY, EXAM UNDER ANESTHESIA, STONE LITHOTRIPSY, (N/A) HOLMIUM LASER APPLICATION  Patient Location: PACU  Anesthesia Type: General  Level of Consciousness: awake, oriented, sedated and patient cooperative  Airway & Oxygen Therapy: Patient Spontanous Breathing and Patient connected to face mask oxygen  Post-op Assessment: Report given to PACU RN and Post -op Vital signs reviewed and stable  Post vital signs: Reviewed and stable  Complications: No apparent anesthesia complications

## 2016-07-12 NOTE — Op Note (Signed)
Preoperative diagnosis: pelvic pain, concern for mesh erosion  Postoperative diagnosis: vaginal mesh extrusion at apex, right lateral wall mesh erosion into bladder with calculus  Procedure: 1 cystoscopy 2. vaginoscopy 3. cystolithalopaxy for a stone greater 2cm  Attending: Nicolette Bang  Anesthesia: General  Estimated blood loss: Minimal  Drains: 22 French foley  Specimens: 1. Bladder calculus  Antibiotics: ancef  Findings:  Ureteral orifices in normal anatomic location.  3cm bladder calculus on right lateral wall. 1cm piece of mesh at base of calculus. 1cm area at vaginal apex with mesh extrusion  Indications: Patient is a 71 year old female with a history of pelvic pain and microhematuria concerning for possible mesh erosion. After discussing treatment options, they decided proceed with cystoscopy and vaginoscopy.  Procedure her in detail: The patient was brought to the operating room and a brief timeout was done to ensure correct patient, correct procedure, correct site.  General anesthesia was administered patient was placed in dorsal lithotomy position.  Their genitalia was then prepped and draped in usual sterile fashion.  A rigid 81 French cystoscope was passed in the urethra and the bladder.  Bladder was inspected and we noted no masses or lesions.  the ureteral orifices were in the normal orthotopic locations. We noted a 3cm calculus adhenet to the right lateral wall. Using the 1000nm laser fiber the bladder calculus was fragmented and the fragments were then irrigated from the bladder. The stone fragments were the sent for analysis. We noted 1cm of mesh eroded into the bladder.  We then cauterized the base with the bipolar resectoscope. We then proceeded to perform vaginoscopy and noted an area 1cm in size of induration at the vaginal apex concerning for mesh extrusion. The bladder was then drained and this concluded the procedure which was well tolerated by  patient.  Complications: None  Condition: Stable, extubated, transferred to PACU  Plan: Patient is to be discharge home. She will followup in 1 week for a voiding trial. She will be referred to Dr. Matilde Sprang for mesh removal.

## 2016-07-12 NOTE — Anesthesia Procedure Notes (Signed)
Performed by: Suan Halter

## 2016-07-12 NOTE — Anesthesia Procedure Notes (Signed)
Procedure Name: Intubation Date/Time: 07/12/2016 11:56 AM Performed by: Mechele Claude Pre-anesthesia Checklist: Patient identified, Emergency Drugs available, Suction available and Patient being monitored Patient Re-evaluated:Patient Re-evaluated prior to inductionOxygen Delivery Method: Circle system utilized Preoxygenation: Pre-oxygenation with 100% oxygen Intubation Type: IV induction, Rapid sequence and Cricoid Pressure applied Laryngoscope Size: Mac and 3 Grade View: Grade I Tube type: Oral Number of attempts: 1 Airway Equipment and Method: Stylet Placement Confirmation: ETT inserted through vocal cords under direct vision,  positive ETCO2 and breath sounds checked- equal and bilateral Secured at: 21 (at teeth) cm Tube secured with: Tape Dental Injury: Teeth and Oropharynx as per pre-operative assessment

## 2016-07-12 NOTE — Discharge Instructions (Signed)
Foley Catheter Care, Adult °A Foley catheter is a soft, flexible tube. This tube is placed into your bladder to drain pee (urine). If you go home with this catheter in place, follow the instructions below. °TAKING CARE OF THE CATHETER °1. Wash your hands with soap and water. °2. Put soap and water on a clean washcloth. °¨ Clean the skin where the tube goes into your body. °§ Clean away from the tube site. °§ Never wipe toward the tube. °§ Clean the area using a circular motion. °¨ Remove all the soap. Pat the area dry with a clean towel. For males, reposition the skin that covers the end of the penis (foreskin). °3. Attach the tube to your leg with tape or a leg strap. Do not stretch the tube tight. If you are using tape, remove any stickiness left behind by past tape you used. °4. Keep the drainage bag below your hips. Keep it off the floor. °5. Check your tube during the day. Make sure it is working and draining. Make sure the tube does not curl, twist, or bend. °6. Do not pull on the tube or try to take it out. °TAKING CARE OF THE DRAINAGE BAGS °You will have a large overnight drainage bag and a small leg bag. You may wear the overnight bag any time. Never wear the small bag at night. Follow the directions below. °Emptying the Drainage Bag  °Empty your drainage bag when it is ?-½ full or at least 2-3 times a day. °1. Wash your hands with soap and water. °2. Keep the drainage bag below your hips. °3. Hold the dirty bag over the toilet or clean container. °4. Open the pour spout at the bottom of the bag. Empty the pee into the toilet or container. Do not let the pour spout touch anything. °5. Clean the pour spout with a gauze pad or cotton ball that has rubbing alcohol on it. °6. Close the pour spout. °7. Attach the bag to your leg with tape or a leg strap. °8. Wash your hands well. °Changing the Drainage Bag  °Change your bag once a month or sooner if it starts to smell or look dirty.  °1. Wash your hands with  soap and water. °2. Pinch the rubber tube so that pee does not spill out. °3. Disconnect the catheter tube from the drainage tube at the connection valve. Do not let the tubes touch anything. °4. Clean the end of the catheter tube with an alcohol wipe. Clean the end of a the drainage tube with a different alcohol wipe. °5. Connect the catheter tube to the drainage tube of the clean drainage bag. °6. Attach the new bag to the leg with tape or a leg strap. Avoid attaching the new bag too tightly. °7. Wash your hands well. °Cleaning the Drainage Bag  °1. Wash your hands with soap and water. °2. Wash the bag in warm, soapy water. °3. Rinse the bag with warm water. °4. Fill the bag with a mixture of white vinegar and water (1 cup vinegar to 1 quart warm water [.2 liter vinegar to 1 liter warm water]). Close the bag and soak it for 30 minutes in the solution. °5. Rinse the bag with warm water. °6. Hang the bag to dry with the pour spout open and hanging downward. °7. Store the clean bag (once it is dry) in a clean plastic bag. °8. Wash your hands well. °PREVENT INFECTION °· Wash your hands before and after touching   your tube.  Take showers every day. Wash the skin where the tube enters your body. Do not take baths. Replace wet leg straps with dry ones, if this applies.  Do not use powders, sprays, or lotions on the genital area. Only use creams, lotions, or ointments as told by your doctor.  For females, wipe from front to back after going to the bathroom.  Drink enough fluids to keep your pee clear or pale yellow unless you are told not to have too much fluid (fluid restriction).  Do not let the drainage bag or tubing touch or lie on the floor.  Wear cotton underwear to keep the area dry. GET HELP IF:  Your pee is cloudy or smells unusually bad.  Your tube becomes clogged.  You are not draining pee into the bag or your bladder feels full.  Your tube starts to leak. GET HELP RIGHT AWAY IF:  You  have pain, puffiness (swelling), redness, or yellowish-white fluid (pus) where the tube enters the body.  You have pain in the belly (abdomen), legs, lower back, or bladder.  You have a fever.  You see blood fill the tube, or your pee is pink or red.  You feel sick to your stomach (nauseous), throw up (vomit), or have chills.  Your tube gets pulled out. MAKE SURE YOU:   Understand these instructions.  Will watch your condition.  Will get help right away if you are not doing well or get worse. This information is not intended to replace advice given to you by your health care provider. Make sure you discuss any questions you have with your health care provider. Document Released: 09/11/2012 Document Revised: 06/07/2014 Document Reviewed: 05/03/2015 Elsevier Interactive Patient Education  2017 [employer] CARE INSTRUCTIONS  Activity: Rest for the remainder of the day.  Do not drive or operate equipment today.  You may resume normal activities in one to two days as instructed by your physician.   Meals: Drink plenty of liquids and eat light foods such as gelatin or soup this evening.  You may return to a normal meal plan tomorrow.  Return to Work: You may return to work in one to two days or as instructed by your physician.  Special Instructions / Symptoms: Call your physician if any of these symptoms occur:   -persistent or heavy bleeding  -bleeding which continues after first few urination  -large blood clots that are difficult to pass  -urine stream diminishes or stops completely  -fever equal to or higher than 101 degrees Farenheit.  -cloudy urine with a strong, foul odor  -severe pain  Females should always wipe from front to back after elimination.  You may feel some burning pain when you urinate.  This should disappear with time.  Applying moist heat to the lower abdomen or a hot tub bath may help relieve the pain. \  Follow-Up / Date of Return  Visit to Your Physician:  Call for an appointment to arrange follow-up.  Patient Signature:  ________________________________________________________  Nurse's Signature:  ________________________________________________________  Post Anesthesia Home Care Instructions  Activity: Get plenty of rest for the remainder of the day. A responsible adult should stay with you for 24 hours following the procedure.  For the next 24 hours, DO NOT: -Drive a car -Paediatric nurse -Drink alcoholic beverages -Take any medication unless instructed by your physician -Make any legal decisions or sign important papers.  Meals: Start with liquid foods such as gelatin or soup.  Progress to regular foods as tolerated. Avoid greasy, spicy, heavy foods. If nausea and/or vomiting occur, drink only clear liquids until the nausea and/or vomiting subsides. Call your physician if vomiting continues.  Special Instructions/Symptoms: Your throat may feel dry or sore from the anesthesia or the breathing tube placed in your throat during surgery. If this causes discomfort, gargle with warm salt water. The discomfort should disappear within 24 hours.  If you had a scopolamine patch placed behind your ear for the management of post- operative nausea and/or vomiting:  1. The medication in the patch is effective for 72 hours, after which it should be removed.  Wrap patch in a tissue and discard in the trash. Wash hands thoroughly with soap and water. 2. You may remove the patch earlier than 72 hours if you experience unpleasant side effects which may include dry mouth, dizziness or visual disturbances. 3. Avoid touching the patch. Wash your hands with soap and water after contact with the patch.

## 2016-07-12 NOTE — Anesthesia Preprocedure Evaluation (Addendum)
Anesthesia Evaluation  Patient identified by MRN, date of birth, ID band Patient awake    Reviewed: Allergy & Precautions, NPO status , Patient's Chart, lab work & pertinent test results  History of Anesthesia Complications (+) PONV  Airway Mallampati: II  TM Distance: >3 FB Neck ROM: Full    Dental  (+) Caps, Dental Advisory Given   Pulmonary sleep apnea (no longer uses CPAP) , COPD (last needed inhaler 2 month ago),  COPD inhaler,    breath sounds clear to auscultation       Cardiovascular (-) anginanegative cardio ROS   Rhythm:Regular Rate:Normal     Neuro/Psych Depression Restless led syndrome    GI/Hepatic Neg liver ROS, GERD  Medicated and Poorly Controlled,  Endo/Other  Morbid obesity  Renal/GU negative Renal ROS     Musculoskeletal  (+) Arthritis , Fibromyalgia -  Abdominal (+) + obese,   Peds  Hematology negative hematology ROS (+)   Anesthesia Other Findings   Reproductive/Obstetrics                            Anesthesia Physical Anesthesia Plan  ASA: II  Anesthesia Plan: General   Post-op Pain Management:    Induction: Intravenous  Airway Management Planned: Oral ETT  Additional Equipment:   Intra-op Plan:   Post-operative Plan: Extubation in OR  Informed Consent: I have reviewed the patients History and Physical, chart, labs and discussed the procedure including the risks, benefits and alternatives for the proposed anesthesia with the patient or authorized representative who has indicated his/her understanding and acceptance.   Dental advisory given  Plan Discussed with: CRNA and Surgeon  Anesthesia Plan Comments: (Plan routine monitors, GETA )        Anesthesia Quick Evaluation

## 2016-07-13 ENCOUNTER — Encounter (HOSPITAL_BASED_OUTPATIENT_CLINIC_OR_DEPARTMENT_OTHER): Payer: Self-pay | Admitting: Urology

## 2016-07-14 NOTE — Anesthesia Postprocedure Evaluation (Addendum)
Anesthesia Post Note  Patient: SHIELA ZAVALZA  Procedure(s) Performed: Procedure(s) (LRB): CYSTOSCOPY, VAGINOSCOPY, EXAM UNDER ANESTHESIA, STONE LITHOTRIPSY, (N/A) HOLMIUM LASER APPLICATION  Patient location during evaluation: PACU Anesthesia Type: General Level of consciousness: awake and alert, oriented and patient cooperative Pain management: pain level controlled Vital Signs Assessment: post-procedure vital signs reviewed and stable Respiratory status: spontaneous breathing, nonlabored ventilation and respiratory function stable Cardiovascular status: blood pressure returned to baseline and stable Postop Assessment: no signs of nausea or vomiting Anesthetic complications: no        Last Vitals:  Vitals:   07/12/16 1345 07/12/16 1445  BP: 136/87 122/71  Pulse: 79 72  Resp: 20 18  Temp:  36.5 C    Last Pain:  Vitals:   07/13/16 1125  TempSrc:   PainSc: 2    Pain Goal: Patients Stated Pain Goal: 3 (07/12/16 1104)               ,E. 

## 2016-07-16 DIAGNOSIS — N210 Calculus in bladder: Secondary | ICD-10-CM | POA: Diagnosis not present

## 2016-07-19 DIAGNOSIS — N210 Calculus in bladder: Secondary | ICD-10-CM | POA: Diagnosis not present

## 2016-07-29 DIAGNOSIS — R102 Pelvic and perineal pain: Secondary | ICD-10-CM | POA: Diagnosis not present

## 2016-07-29 DIAGNOSIS — N210 Calculus in bladder: Secondary | ICD-10-CM | POA: Diagnosis not present

## 2016-08-12 ENCOUNTER — Telehealth: Payer: Self-pay | Admitting: Rheumatology

## 2016-08-12 ENCOUNTER — Encounter: Payer: Self-pay | Admitting: *Deleted

## 2016-08-12 NOTE — Telephone Encounter (Signed)
Left message to advise patient letter has been written and faxed to Dr. Wynelle Link.

## 2016-08-12 NOTE — Telephone Encounter (Signed)
Patient called stating that she needed [place] to send a note to Dr. Keith Rake at [facility] letting him know that she did receive her 3rd Euflexxa injection in January 2017. She wants this done today and also to call her to confirm that it was sent over. [phone]. Thank you.

## 2016-09-08 DIAGNOSIS — Z96651 Presence of right artificial knee joint: Secondary | ICD-10-CM | POA: Diagnosis not present

## 2016-09-08 DIAGNOSIS — M1712 Unilateral primary osteoarthritis, left knee: Secondary | ICD-10-CM | POA: Diagnosis not present

## 2016-09-10 ENCOUNTER — Other Ambulatory Visit: Payer: Self-pay | Admitting: Urology

## 2016-09-10 DIAGNOSIS — R102 Pelvic and perineal pain: Secondary | ICD-10-CM | POA: Diagnosis not present

## 2016-09-10 DIAGNOSIS — N210 Calculus in bladder: Secondary | ICD-10-CM | POA: Diagnosis not present

## 2016-09-29 DIAGNOSIS — Z1231 Encounter for screening mammogram for malignant neoplasm of breast: Secondary | ICD-10-CM | POA: Diagnosis not present

## 2016-09-30 ENCOUNTER — Encounter (HOSPITAL_BASED_OUTPATIENT_CLINIC_OR_DEPARTMENT_OTHER): Payer: Self-pay | Admitting: *Deleted

## 2016-09-30 NOTE — Progress Notes (Signed)
Pt instructed to take zantac and allegra am of surgery.npo pmn x clear liquids until 0700.  Then absolutely nothing by mouth.  Needs istat on arrival.

## 2016-10-11 ENCOUNTER — Encounter (HOSPITAL_BASED_OUTPATIENT_CLINIC_OR_DEPARTMENT_OTHER)
Admission: RE | Disposition: A | Discharge status: Home / Self Care | Payer: Self-pay | Source: Ambulatory Visit | Attending: Urology

## 2016-10-11 ENCOUNTER — Ambulatory Visit (HOSPITAL_BASED_OUTPATIENT_CLINIC_OR_DEPARTMENT_OTHER)
Admission: RE | Admit: 2016-10-11 | Discharge: 2016-10-11 | Disposition: A | Discharge status: Home / Self Care | Payer: PPO | Source: Ambulatory Visit | Attending: Urology | Admitting: Urology

## 2016-10-11 ENCOUNTER — Ambulatory Visit (HOSPITAL_BASED_OUTPATIENT_CLINIC_OR_DEPARTMENT_OTHER): Payer: PPO | Admitting: Anesthesiology

## 2016-10-11 ENCOUNTER — Encounter (HOSPITAL_BASED_OUTPATIENT_CLINIC_OR_DEPARTMENT_OTHER): Payer: Self-pay | Admitting: *Deleted

## 2016-10-11 DIAGNOSIS — G4733 Obstructive sleep apnea (adult) (pediatric): Secondary | ICD-10-CM | POA: Insufficient documentation

## 2016-10-11 DIAGNOSIS — N210 Calculus in bladder: Secondary | ICD-10-CM | POA: Diagnosis not present

## 2016-10-11 DIAGNOSIS — J45909 Unspecified asthma, uncomplicated: Secondary | ICD-10-CM | POA: Insufficient documentation

## 2016-10-11 DIAGNOSIS — G4730 Sleep apnea, unspecified: Secondary | ICD-10-CM | POA: Diagnosis not present

## 2016-10-11 DIAGNOSIS — T83718A Erosion of other implanted mesh and other prosthetic materials to surrounding organ or tissue, initial encounter: Secondary | ICD-10-CM | POA: Diagnosis not present

## 2016-10-11 DIAGNOSIS — Z90710 Acquired absence of both cervix and uterus: Secondary | ICD-10-CM | POA: Insufficient documentation

## 2016-10-11 DIAGNOSIS — T8389XA Other specified complication of genitourinary prosthetic devices, implants and grafts, initial encounter: Secondary | ICD-10-CM | POA: Diagnosis not present

## 2016-10-11 DIAGNOSIS — Z96651 Presence of right artificial knee joint: Secondary | ICD-10-CM | POA: Insufficient documentation

## 2016-10-11 DIAGNOSIS — Y831 Surgical operation with implant of artificial internal device as the cause of abnormal reaction of the patient, or of later complication, without mention of misadventure at the time of the procedure: Secondary | ICD-10-CM | POA: Insufficient documentation

## 2016-10-11 DIAGNOSIS — Z79890 Hormone replacement therapy (postmenopausal): Secondary | ICD-10-CM | POA: Insufficient documentation

## 2016-10-11 DIAGNOSIS — K219 Gastro-esophageal reflux disease without esophagitis: Secondary | ICD-10-CM | POA: Diagnosis not present

## 2016-10-11 DIAGNOSIS — Z79899 Other long term (current) drug therapy: Secondary | ICD-10-CM | POA: Insufficient documentation

## 2016-10-11 DIAGNOSIS — Z791 Long term (current) use of non-steroidal anti-inflammatories (NSAID): Secondary | ICD-10-CM | POA: Insufficient documentation

## 2016-10-11 DIAGNOSIS — D649 Anemia, unspecified: Secondary | ICD-10-CM | POA: Diagnosis not present

## 2016-10-11 HISTORY — PX: CYSTOSCOPY: SHX5120

## 2016-10-11 HISTORY — PX: CYSTOSCOPY WITH LITHOLAPAXY: SHX1425

## 2016-10-11 SURGERY — CYSTOSCOPY, WITH BLADDER CALCULUS LITHOLAPAXY
Anesthesia: General

## 2016-10-11 MED ORDER — METOCLOPRAMIDE HCL 5 MG/ML IJ SOLN
INTRAMUSCULAR | Status: DC | PRN
  Administered 2016-10-11: 10 mg via INTRAVENOUS

## 2016-10-11 MED ORDER — ONDANSETRON HCL 4 MG/2ML IJ SOLN
INTRAMUSCULAR | Status: AC
  Filled 2016-10-11: qty 2

## 2016-10-11 MED ORDER — MIDAZOLAM HCL 2 MG/2ML IJ SOLN
INTRAMUSCULAR | Status: AC
  Filled 2016-10-11: qty 2

## 2016-10-11 MED ORDER — SUCCINYLCHOLINE CHLORIDE 200 MG/10ML IV SOSY
PREFILLED_SYRINGE | INTRAVENOUS | Status: DC | PRN
  Administered 2016-10-11: 120 mg via INTRAVENOUS

## 2016-10-11 MED ORDER — SUCCINYLCHOLINE CHLORIDE 200 MG/10ML IV SOSY
PREFILLED_SYRINGE | INTRAVENOUS | Status: AC
  Filled 2016-10-11: qty 10

## 2016-10-11 MED ORDER — LACTATED RINGERS IV SOLN
INTRAVENOUS | Status: DC
  Administered 2016-10-11: 14:00:00 via INTRAVENOUS
  Filled 2016-10-11: qty 1000

## 2016-10-11 MED ORDER — METOCLOPRAMIDE HCL 5 MG/ML IJ SOLN
INTRAMUSCULAR | Status: AC
  Filled 2016-10-11: qty 2

## 2016-10-11 MED ORDER — PROMETHAZINE HCL 25 MG/ML IJ SOLN
6.2500 mg | INTRAMUSCULAR | Status: DC | PRN
  Filled 2016-10-11: qty 1

## 2016-10-11 MED ORDER — SCOPOLAMINE 1 MG/3DAYS TD PT72
MEDICATED_PATCH | TRANSDERMAL | Status: DC | PRN
  Administered 2016-10-11: 1 via TRANSDERMAL

## 2016-10-11 MED ORDER — TRAMADOL HCL 50 MG PO TABS: 50.0000 mg | ORAL_TABLET | Freq: Four times a day (QID) | ORAL | 0 refills | Status: DC | PRN

## 2016-10-11 MED ORDER — MEPERIDINE HCL 25 MG/ML IJ SOLN
6.2500 mg | INTRAMUSCULAR | Status: DC | PRN
  Filled 2016-10-11: qty 1

## 2016-10-11 MED ORDER — CEFAZOLIN SODIUM-DEXTROSE 2-4 GM/100ML-% IV SOLN
2.0000 g | INTRAVENOUS | Status: AC
  Administered 2016-10-11: 2 g via INTRAVENOUS
  Filled 2016-10-11: qty 100

## 2016-10-11 MED ORDER — PROPOFOL 10 MG/ML IV BOLUS
INTRAVENOUS | Status: DC | PRN
  Administered 2016-10-11: 200 mg via INTRAVENOUS

## 2016-10-11 MED ORDER — SCOPOLAMINE 1 MG/3DAYS TD PT72
MEDICATED_PATCH | TRANSDERMAL | Status: AC
  Filled 2016-10-11: qty 1

## 2016-10-11 MED ORDER — FENTANYL CITRATE (PF) 100 MCG/2ML IJ SOLN
INTRAMUSCULAR | Status: DC | PRN
  Administered 2016-10-11 (×2): 50 ug via INTRAVENOUS

## 2016-10-11 MED ORDER — MIDAZOLAM HCL 5 MG/5ML IJ SOLN
INTRAMUSCULAR | Status: DC | PRN
  Administered 2016-10-11: 2 mg via INTRAVENOUS

## 2016-10-11 MED ORDER — ONDANSETRON HCL 4 MG/2ML IJ SOLN: INTRAMUSCULAR | Status: DC | PRN

## 2016-10-11 MED ORDER — DEXAMETHASONE SODIUM PHOSPHATE 10 MG/ML IJ SOLN
INTRAMUSCULAR | Status: AC
  Filled 2016-10-11: qty 1

## 2016-10-11 MED ORDER — LIDOCAINE 2% (20 MG/ML) 5 ML SYRINGE
INTRAMUSCULAR | Status: DC | PRN
  Administered 2016-10-11: 60 mg via INTRAVENOUS

## 2016-10-11 MED ORDER — HYDROMORPHONE HCL 1 MG/ML IJ SOLN
0.2500 mg | INTRAMUSCULAR | Status: DC | PRN
  Filled 2016-10-11: qty 0.5

## 2016-10-11 MED ORDER — DEXAMETHASONE SODIUM PHOSPHATE 4 MG/ML IJ SOLN
INTRAMUSCULAR | Status: DC | PRN
  Administered 2016-10-11: 10 mg via INTRAVENOUS

## 2016-10-11 MED ORDER — PROPOFOL 10 MG/ML IV BOLUS
INTRAVENOUS | Status: AC
  Filled 2016-10-11: qty 20

## 2016-10-11 MED ORDER — FENTANYL CITRATE (PF) 100 MCG/2ML IJ SOLN
INTRAMUSCULAR | Status: AC
  Filled 2016-10-11: qty 2

## 2016-10-11 MED ORDER — CEFAZOLIN SODIUM-DEXTROSE 2-4 GM/100ML-% IV SOLN
INTRAVENOUS | Status: AC
  Filled 2016-10-11: qty 100

## 2016-10-11 MED ORDER — ONDANSETRON HCL 4 MG/2ML IJ SOLN
INTRAMUSCULAR | Status: DC | PRN
  Administered 2016-10-11: 4 mg via INTRAVENOUS

## 2016-10-11 MED ORDER — LIDOCAINE 2% (20 MG/ML) 5 ML SYRINGE
INTRAMUSCULAR | Status: AC
  Filled 2016-10-11: qty 5

## 2016-10-11 SURGICAL SUPPLY — 34 items
BAG DRAIN URO-CYSTO SKYTR STRL (DRAIN) ×2 IMPLANT
BAG DRN UROCATH (DRAIN) ×1
BASKET DAKOTA 1.9FR 11X120 (BASKET) IMPLANT
BASKET LASER NITINOL 1.9FR (BASKET) IMPLANT
BASKET ZERO TIP NITINOL 2.4FR (BASKET) IMPLANT
BSKT STON RTRVL 120 1.9FR (BASKET)
BSKT STON RTRVL ZERO TP 2.4FR (BASKET)
CATH FOLEY 3WAY 30CC 22F (CATHETERS) ×1 IMPLANT
CATH INTERMIT  6FR 70CM (CATHETERS) IMPLANT
CLOTH BEACON ORANGE TIMEOUT ST (SAFETY) ×2 IMPLANT
ELECT REM PT RETURN 9FT ADLT (ELECTROSURGICAL) ×2
ELECTRODE REM PT RTRN 9FT ADLT (ELECTROSURGICAL) IMPLANT
EVACUATOR MICROVAS BLADDER (UROLOGICAL SUPPLIES) IMPLANT
FIBER LASER FLEXIVA 1000 (UROLOGICAL SUPPLIES) ×1 IMPLANT
FIBER LASER FLEXIVA 200 (UROLOGICAL SUPPLIES) IMPLANT
FIBER LASER FLEXIVA 365 (UROLOGICAL SUPPLIES) IMPLANT
FIBER LASER FLEXIVA 550 (UROLOGICAL SUPPLIES) IMPLANT
FIBER LASER TRAC TIP (UROLOGICAL SUPPLIES) IMPLANT
GLOVE BIO SURGEON STRL SZ8 (GLOVE) ×2 IMPLANT
GOWN STRL REUS W/ TWL LRG LVL3 (GOWN DISPOSABLE) ×1 IMPLANT
GOWN STRL REUS W/ TWL XL LVL3 (GOWN DISPOSABLE) ×1 IMPLANT
GOWN STRL REUS W/TWL LRG LVL3 (GOWN DISPOSABLE) ×2
GOWN STRL REUS W/TWL XL LVL3 (GOWN DISPOSABLE) ×2
GUIDEWIRE ANG ZIPWIRE 038X150 (WIRE) ×2 IMPLANT
GUIDEWIRE STR DUAL SENSOR (WIRE) IMPLANT
IV NS IRRIG 3000ML ARTHROMATIC (IV SOLUTION) ×1 IMPLANT
KIT RM TURNOVER CYSTO AR (KITS) ×2 IMPLANT
MANIFOLD NEPTUNE II (INSTRUMENTS) ×1 IMPLANT
PACK CYSTO (CUSTOM PROCEDURE TRAY) ×2 IMPLANT
STENT URET 6FRX26 CONTOUR (STENTS) IMPLANT
SYRINGE 10CC LL (SYRINGE) ×2 IMPLANT
TUBE CONNECTING 12X1/4 (SUCTIONS) IMPLANT
TUBE FEEDING 8FR 16IN STR KANG (MISCELLANEOUS) IMPLANT
WATER STERILE IRR 3000ML UROMA (IV SOLUTION) ×2 IMPLANT

## 2016-10-11 NOTE — Anesthesia Preprocedure Evaluation (Signed)
Anesthesia Evaluation  Patient identified by MRN, date of birth, ID band Patient awake    Reviewed: Allergy & Precautions, H&P , NPO status , Patient's Chart, lab work & pertinent test results  History of Anesthesia Complications (+) PONV and history of anesthetic complications  Airway Mallampati: II  TM Distance: >3 FB Neck ROM: Full    Dental  (+) Dental Advisory Given, Teeth Intact   Pulmonary asthma , sleep apnea ,    breath sounds clear to auscultation       Cardiovascular negative cardio ROS   Rhythm:Regular Rate:Normal     Neuro/Psych PSYCHIATRIC DISORDERS Depression negative neurological ROS     GI/Hepatic negative GI ROS, Neg liver ROS, GERD  ,  Endo/Other  negative endocrine ROS  Renal/GU negative Renal ROS     Musculoskeletal  (+) Arthritis , Fibromyalgia -  Abdominal   Peds  Hematology  (+) Blood dyscrasia, anemia ,   Anesthesia Other Findings   Reproductive/Obstetrics negative OB ROS                             Anesthesia Physical  Anesthesia Plan  ASA: II  Anesthesia Plan: General   Post-op Pain Management:    Induction: Intravenous  Airway Management Planned: LMA and Oral ETT  Additional Equipment:   Intra-op Plan:   Post-operative Plan: Extubation in OR  Informed Consent: I have reviewed the patients History and Physical, chart, labs and discussed the procedure including the risks, benefits and alternatives for the proposed anesthesia with the patient or authorized representative who has indicated his/her understanding and acceptance.   Dental advisory given  Plan Discussed with: CRNA  Anesthesia Plan Comments:         Anesthesia Quick Evaluation

## 2016-10-11 NOTE — Brief Op Note (Signed)
10/11/2016  3:29 PM  PATIENT:  Cassandra [name]  71 y.o. female  PRE-OPERATIVE DIAGNOSIS:  ERODED BLADDER MESH  POST-OPERATIVE DIAGNOSIS:  ERODED BLADDER MESH  PROCEDURE:  Procedure(s): CYSTOSCOPY WITH LITHOLAPAXY/ EXCISION OF MESH (N/A) CYSTOSCOPY (N/A)  SURGEON:  Surgeon(s) and Role:    * , Candee Furbish, MD - Primary  PHYSICIAN ASSISTANT:   ASSISTANTS: none   ANESTHESIA:   general  EBL:  Total I/O In: 500 [I.V.:500] Out: -   BLOOD ADMINISTERED:none  DRAINS: Urinary Catheter (Foley)   LOCAL MEDICATIONS USED:  NONE  SPECIMEN:  No Specimen  DISPOSITION OF SPECIMEN:  N/A  COUNTS:  YES  TOURNIQUET:  * No tourniquets in log *  DICTATION: .Note written in EPIC  PLAN OF CARE: Discharge to home after PACU  PATIENT DISPOSITION:  PACU - hemodynamically stable.   Delay start of Pharmacological VTE agent (>24hrs) due to surgical blood loss or risk of bleeding: not applicable

## 2016-10-11 NOTE — Discharge Instructions (Signed)
°  Post Anesthesia Home Care Instructions ° °Activity: °Get plenty of rest for the remainder of the day. A responsible individual must stay with you for 24 hours following the procedure.  °For the next 24 hours, DO NOT: °-Drive a car °-Operate machinery °-Drink alcoholic beverages °-Take any medication unless instructed by your physician °-Make any legal decisions or sign important papers. ° °Meals: °Start with liquid foods such as gelatin or soup. Progress to regular foods as tolerated. Avoid greasy, spicy, heavy foods. If nausea and/or vomiting occur, drink only clear liquids until the nausea and/or vomiting subsides. Call your physician if vomiting continues. ° °Special Instructions/Symptoms: °Your throat may feel dry or sore from the anesthesia or the breathing tube placed in your throat during surgery. If this causes discomfort, gargle with warm salt water. The discomfort should disappear within 24 hours. ° °If you had a scopolamine patch placed behind your ear for the management of post- operative nausea and/or vomiting: ° °1. The medication in the patch is effective for 72 hours, after which it should be removed.  Wrap patch in a tissue and discard in the trash. Wash hands thoroughly with soap and water. °2. You may remove the patch earlier than 72 hours if you experience unpleasant side effects which may include dry mouth, dizziness or visual disturbances. °3. Avoid touching the patch. Wash your hands with soap and water after contact with the patch. °  °CYSTOSCOPY HOME CARE INSTRUCTIONS ° °Activity: °Rest for the remainder of the day.  Do not drive or operate equipment today.  You may resume normal activities in one to two days as instructed by your physician.  ° °Meals: °Drink plenty of liquids and eat light foods such as gelatin or soup this evening.  You may return to a normal meal plan tomorrow. ° °Return to Work: °You may return to work in one to two days or as instructed by your physician. ° °Special  Instructions / Symptoms: °Call your physician if any of these symptoms occur: ° ° -persistent or heavy bleeding ° -bleeding which continues after first few urination ° -large blood clots that are difficult to pass ° -urine stream diminishes or stops completely ° -fever equal to or higher than 101 degrees Farenheit. ° -cloudy urine with a strong, foul odor ° -severe pain ° °Females should always wipe from front to back after elimination.  You may feel some burning pain when you urinate.  This should disappear with time.  Applying moist heat to the lower abdomen or a hot tub bath may help relieve the pain. \ ° °Follow-Up / Date of Return Visit to Your Physician:   ° °Call for an appointment to arrange follow-up. ° °Patient Signature:  ________________________________________________________ ° °Nurse's Signature:  ________________________________________________________ ° °

## 2016-10-11 NOTE — H&P (Signed)
Urology Admission H&P  Chief Complaint: dysuria  History of Present Illness: Ms Casillas is a [phone] with mesh erosion into the bladder and a bladder calculus here for endoscopic management  Past Medical History:  Diagnosis Date  . Anemia    SOMETIMES ANEMIC  . Arthritis   . Asthma    PT STATES HER ASTHMA IS MILD-HAS PRO AIR INHALER TO USE IF NEEDED  . Depression   . Diarrhea    PROBLEMS WITH DIARRHEA  & STOMACH "LOUD NOISES" - NO PAIN --PT WAS PUT ON DRUG CREON- FEELS THAT IT HAS HELPED  . Diverticulosis   . Fibromyalgia   . GERD (gastroesophageal reflux disease)   . OSA (obstructive sleep apnea)    hasn't used in several years  . PONV (postoperative nausea and vomiting)   . RLS (restless legs syndrome)   . Seasonal allergies    Past Surgical History:  Procedure Laterality Date  . BLADDER SUSPENSION  1990's  . bladder tacking  2010   with mesh  . BREAST LUMPECTOMY Left 1985   Benign cyst  . CYSTOSCOPY N/A 07/12/2016   Procedure: CYSTOSCOPY, VAGINOSCOPY, EXAM UNDER ANESTHESIA, STONE LITHOTRIPSY,;  Surgeon: Cleon Gustin, MD;  Location: [facility];  Service: Urology;  Laterality: N/A;  . HOLMIUM LASER APPLICATION  09/16/3788   Procedure: HOLMIUM LASER APPLICATION;  Surgeon: Cleon Gustin, MD;  Location: [facility];  Service: Urology;;  . RIGHT KNEE ARTHROSCOPY    . TOTAL KNEE ARTHROPLASTY Right 09/12/2012   Procedure: RIGHT TOTAL KNEE ARTHROPLASTY;  Surgeon: Gearlean Alf, MD;  Location: [facility];  Service: Orthopedics;  Laterality: Right;  Marland Kitchen VAGINAL HYSTERECTOMY  1984    Home Medications:  Prescriptions Prior to Admission  Medication Sig Dispense Refill Last Dose  . Calcium Carbonate-Vit D-Min (CALCIUM 1200 PO) Take by mouth.   Past Week at Unknown time  . estradiol (ESTRACE) 0.1 MG/GM vaginal cream Place 1 Applicatorful vaginally at bedtime.   Past Month at Unknown time  . fexofenadine (ALLEGRA) 30 MG tablet Take 30 mg by mouth daily.    10/11/2016 at 0800  . naproxen sodium (ANAPROX) 220 MG tablet Take 220 mg by mouth 2 (two) times daily with a meal.   Past Week at Unknown time  . omeprazole-sodium bicarbonate (ZEGERID) 40-1100 MG per capsule Take 1 capsule by mouth daily as needed.    10/11/2016 at 0800  . Polyethyl Glycol-Propyl Glycol (SYSTANE OP) Place 1 drop into both eyes at bedtime as needed (dry eyes).    10/11/2016 at 0800  . ranitidine (ZANTAC) 150 MG capsule Take 150 mg by mouth 2 (two) times daily.    Past Week at Unknown time  . spironolactone (ALDACTONE) 25 MG tablet Take 25 mg by mouth at bedtime. Doesn't take daily   10/10/2016 at Unknown time  . vitamin B-12 (CYANOCOBALAMIN) 100 MCG tablet Take 100 mcg by mouth daily.   Past Week at Unknown time  . zolpidem (AMBIEN) 5 MG tablet Take 5 mg by mouth at bedtime as needed for sleep.   10/10/2016 at Unknown time  . acetaminophen (TYLENOL) 500 MG tablet Take 500 mg by mouth every 4 (four) hours as needed.    More than a month at Unknown time  . albuterol (PROVENTIL HFA;VENTOLIN HFA) 108 (90 BASE) MCG/ACT inhaler Inhale 2 puffs into the lungs every 6 (six) hours as needed for wheezing.   More than a month at Unknown time  . meclizine (ANTIVERT) 25 MG tablet  Take 1 tablet (25 mg total) by mouth 3 (three) times daily as needed for dizziness. 30 tablet 0 More than a month at Unknown time  . traMADol (ULTRAM) 50 MG tablet Take 1 tablet (50 mg total) by mouth every 6 (six) hours as needed. 30 tablet 0 More than a month at Unknown time   Allergies:  Allergies  Allergen Reactions  . Shellfish Allergy     HIVES  . Sulfonamide Derivatives Hives  . Hydrochlorothiazide Hives    Unknown    Family History  Problem Relation Age of Onset  . Kidney cancer Mother   . Diabetes Mother    Social History:  reports that she has never smoked. She has never used smokeless tobacco. She reports that she drinks about 0.6 oz of alcohol per week . She reports that she does not use  drugs.  Review of Systems  Genitourinary: Positive for dysuria and hematuria.  All other systems reviewed and are negative.   Physical Exam:  Vital signs in last 24 hours: Temp:  [98.9 F (37.2 C)] 98.9 F (37.2 C) (05/14 1322) Pulse Rate:  [74] 74 (05/14 1322) Resp:  [16] 16 (05/14 1322) BP: (116)/(69) 116/69 (05/14 1322) SpO2:  [98 %] 98 % (05/14 1322) Weight:  [93.4 kg (206 lb)] 93.4 kg (206 lb) (05/14 1322) Physical Exam  Constitutional: She is oriented to person, place, and time. She appears well-developed and well-nourished.  HENT:  Head: Normocephalic and atraumatic.  Eyes: EOM are normal. Pupils are equal, round, and reactive to light.  Neck: Normal range of motion. No thyromegaly present.  Cardiovascular: Normal rate and regular rhythm.   Respiratory: Effort normal. No respiratory distress.  GI: Soft. She exhibits no distension.  Musculoskeletal: Normal range of motion. She exhibits no edema.  Neurological: She is alert and oriented to person, place, and time.  Skin: Skin is warm and dry.  Psychiatric: She has a normal mood and affect. Her behavior is normal. Judgment and thought content normal.    Laboratory Data:  No results found for this or any previous visit (from the past 24 hour(s)). No results found for this or any previous visit (from the past 240 hour(s)). Creatinine: No results for input(s): CREATININE in the last 168 hours. Baseline Creatinine: unknwon  Impression/Assessment:  70yo with mesh erosion in the bladder and a bladder calculus  Plan:  The risks/benefits/alternatives to endoscopic removal of exposed mesh and cystolithalopaxy was explained to the patient and she understands and wishes to proceed with surgery  Nicolette Bang 10/11/2016, 2:39 PM

## 2016-10-11 NOTE — Transfer of Care (Signed)
  Last Vitals:  Vitals:   10/11/16 1322 10/11/16 1545  BP: 116/69 121/74  Pulse: 74 95  Resp: 16 19  Temp: 37.2 C 36.9 C    Last Pain:  Vitals:   10/11/16 1322  TempSrc: Oral  PainSc:       Patients Stated Pain Goal: 3 (10/11/16 1339)  Immediate Anesthesia Transfer of Care Note  Patient: Cassandra [name]  Procedure(s) Performed: Procedure(s) (LRB): CYSTOSCOPY WITH LITHOLAPAXY/ EXCISION OF MESH (N/A) CYSTOSCOPY (N/A)  Patient Location: PACU  Anesthesia Type: General  Level of Consciousness: awake, alert  and oriented  Airway & Oxygen Therapy: Patient Spontanous Breathing and Patient connected to nasal cannula oxygen  Post-op Assessment: Report given to PACU RN and Post -op Vital signs reviewed and stable  Post vital signs: Reviewed and stable  Complications: No apparent anesthesia complications

## 2016-10-11 NOTE — Anesthesia Procedure Notes (Signed)
Procedure Name: Intubation Date/Time: 10/11/2016 3:00 PM Performed by: Nolon Nations Pre-anesthesia Checklist: Patient identified, Emergency Drugs available, Suction available and Patient being monitored Patient Re-evaluated:Patient Re-evaluated prior to inductionOxygen Delivery Method: Circle system utilized Preoxygenation: Pre-oxygenation with 100% oxygen Intubation Type: IV induction, Cricoid Pressure applied and Rapid sequence Laryngoscope Size: Mac and 3 Grade View: Grade I Tube type: Oral Tube size: 7.0 mm Number of attempts: 1 Airway Equipment and Method: Stylet and Oral airway Placement Confirmation: ETT inserted through vocal cords under direct vision,  positive ETCO2 and breath sounds checked- equal and bilateral Secured at: 21 cm Tube secured with: Tape Dental Injury: Teeth and Oropharynx as per pre-operative assessment

## 2016-10-12 ENCOUNTER — Encounter (HOSPITAL_BASED_OUTPATIENT_CLINIC_OR_DEPARTMENT_OTHER): Payer: Self-pay | Admitting: Urology

## 2016-10-12 LAB — POCT I-STAT 4, (NA,K, GLUC, HGB,HCT)
GLUCOSE: 86 mg/dL (ref 65–99)
HCT: 40 % (ref 36.0–46.0)
Hemoglobin: 13.6 g/dL (ref 12.0–15.0)
POTASSIUM: 3.5 mmol/L (ref 3.5–5.1)
Sodium: 143 mmol/L (ref 135–145)

## 2016-10-13 NOTE — Anesthesia Postprocedure Evaluation (Signed)
Anesthesia Post Note  Patient: Cassandra [name]  Procedure(s) Performed: Procedure(s) (LRB): CYSTOSCOPY WITH LITHOLAPAXY/ EXCISION OF MESH (N/A) CYSTOSCOPY (N/A)  Patient location during evaluation: PACU Anesthesia Type: General Level of consciousness: sedated and patient cooperative Pain management: pain level controlled Vital Signs Assessment: post-procedure vital signs reviewed and stable Respiratory status: spontaneous breathing Cardiovascular status: stable Anesthetic complications: no       Last Vitals:  Vitals:   10/11/16 1615 10/11/16 1650  BP: 130/83 115/67  Pulse: 78 67  Resp: 13 14  Temp:  36.9 C    Last Pain:  Vitals:   10/12/16 0920  TempSrc:   PainSc: [place]

## 2016-10-15 DIAGNOSIS — R102 Pelvic and perineal pain: Secondary | ICD-10-CM | POA: Diagnosis not present

## 2016-10-15 DIAGNOSIS — N210 Calculus in bladder: Secondary | ICD-10-CM | POA: Diagnosis not present

## 2016-10-25 NOTE — Op Note (Signed)
Preoperative diagnosis: mesh erosion, bladder calculus  Postoperative diagnosis: same  Procedure: 1 cystoscopy 2. cystolithalopaxy for a stone less than 2cm 3. Removal of eroded mesh in the bladder  Attending: Nicolette Bang  Anesthesia: General  Estimated blood loss: Minimal  Drains: 22 french foley catheter  Specimens: 1. Bladder calculus  Antibiotics: ancef  Findings:  Ureteral orifices in normal anatomic location.  1cm bladder calculus attached to eroded right lateral wall mesh. Removal of all visualized mesh in the bladder.  Indications: Patient is a 71 year old female with a history of eroded bladder mesh and bladder calculus.  After discussing treatment options, they decided proceed with cystolithalopaxy and removal of eroded mesh.  Procedure her in detail: The patient was brought to the operating room and a brief timeout was done to ensure correct patient, correct procedure, correct site.  General anesthesia was administered patient was placed in dorsal lithotomy position.  Their genitalia was then prepped and draped in usual sterile fashion.  A rigid 43 French cystoscope was passed in the urethra and the bladder.  Bladder was inspected and we noted no masses or lesions.  the ureteral orifices were in the normal orthotopic locations. Using the 1000nm laser fiber the bladder calculus was fragmented and the fragments were then irrigated from the bladder. The stone fragments were the sent for analysis. Using the endoscopic scissors all visible mesh in the bladder was removed. Hemostasis was then obtained with a bugbee. The bladder was then drained, a 22 french foley was placed and this concluded the procedure which was well tolerated by patient.  Complications: None  Condition: Stable, extubated, transferred to PACU  Plan: Patient is to be discharge home. She will followup in 5 days for a voiding trial

## 2016-10-27 DIAGNOSIS — M1712 Unilateral primary osteoarthritis, left knee: Secondary | ICD-10-CM | POA: Diagnosis not present

## 2016-11-25 DIAGNOSIS — R3915 Urgency of urination: Secondary | ICD-10-CM | POA: Diagnosis not present

## 2016-11-25 DIAGNOSIS — R102 Pelvic and perineal pain: Secondary | ICD-10-CM | POA: Diagnosis not present

## 2016-11-25 DIAGNOSIS — N210 Calculus in bladder: Secondary | ICD-10-CM | POA: Diagnosis not present

## 2016-11-29 DIAGNOSIS — N952 Postmenopausal atrophic vaginitis: Secondary | ICD-10-CM | POA: Diagnosis not present

## 2016-11-29 DIAGNOSIS — Z6834 Body mass index (BMI) 34.0-34.9, adult: Secondary | ICD-10-CM | POA: Diagnosis not present

## 2016-11-29 DIAGNOSIS — Z01419 Encounter for gynecological examination (general) (routine) without abnormal findings: Secondary | ICD-10-CM | POA: Diagnosis not present

## 2016-12-13 NOTE — Anesthesia Postprocedure Evaluation (Deleted)
Anesthesia Post Note  Patient: Cassandra [name]  Procedure(s) Performed: Procedure(s) (LRB): CYSTOSCOPY, VAGINOSCOPY, EXAM UNDER ANESTHESIA, STONE LITHOTRIPSY, (N/A) HOLMIUM LASER APPLICATION     Anesthesia Post Evaluation  Last Vitals:  Vitals:   07/12/16 1345 07/12/16 1445  BP: 136/87 122/71  Pulse: 79 72  Resp: 20 18  Temp:  36.5 C    Last Pain:  Vitals:   07/13/16 1125  TempSrc:   PainSc: 2                  ,E. 

## 2016-12-13 NOTE — Addendum Note (Signed)
Addendum  created 12/13/16 1631 by Annye Asa, MD   Delete clinical note, Sign clinical note

## 2016-12-13 NOTE — Addendum Note (Signed)
Addendum  created 12/13/16 1530 by Annye Asa, MD   Sign clinical note

## 2017-01-06 DIAGNOSIS — Z0000 Encounter for general adult medical examination without abnormal findings: Secondary | ICD-10-CM | POA: Diagnosis not present

## 2017-01-06 DIAGNOSIS — N390 Urinary tract infection, site not specified: Secondary | ICD-10-CM | POA: Diagnosis not present

## 2017-01-06 DIAGNOSIS — E7800 Pure hypercholesterolemia, unspecified: Secondary | ICD-10-CM | POA: Diagnosis not present

## 2017-01-06 DIAGNOSIS — E559 Vitamin D deficiency, unspecified: Secondary | ICD-10-CM | POA: Diagnosis not present

## 2017-01-13 DIAGNOSIS — G4733 Obstructive sleep apnea (adult) (pediatric): Secondary | ICD-10-CM | POA: Diagnosis not present

## 2017-01-13 DIAGNOSIS — G4700 Insomnia, unspecified: Secondary | ICD-10-CM | POA: Diagnosis not present

## 2017-01-13 DIAGNOSIS — Z0000 Encounter for general adult medical examination without abnormal findings: Secondary | ICD-10-CM | POA: Diagnosis not present

## 2017-01-13 DIAGNOSIS — Z9989 Dependence on other enabling machines and devices: Secondary | ICD-10-CM | POA: Diagnosis not present

## 2017-01-13 DIAGNOSIS — T753XXA Motion sickness, initial encounter: Secondary | ICD-10-CM | POA: Diagnosis not present

## 2017-01-13 DIAGNOSIS — N390 Urinary tract infection, site not specified: Secondary | ICD-10-CM | POA: Diagnosis not present

## 2017-01-17 DIAGNOSIS — J45909 Unspecified asthma, uncomplicated: Secondary | ICD-10-CM | POA: Diagnosis not present

## 2017-01-17 DIAGNOSIS — J9801 Acute bronchospasm: Secondary | ICD-10-CM | POA: Diagnosis not present

## 2017-01-17 DIAGNOSIS — R05 Cough: Secondary | ICD-10-CM | POA: Diagnosis not present

## 2017-02-03 DIAGNOSIS — M1712 Unilateral primary osteoarthritis, left knee: Secondary | ICD-10-CM | POA: Diagnosis not present

## 2017-03-21 DIAGNOSIS — R102 Pelvic and perineal pain: Secondary | ICD-10-CM | POA: Diagnosis not present

## 2017-03-21 DIAGNOSIS — R3915 Urgency of urination: Secondary | ICD-10-CM | POA: Diagnosis not present

## 2017-04-01 DIAGNOSIS — H04123 Dry eye syndrome of bilateral lacrimal glands: Secondary | ICD-10-CM | POA: Diagnosis not present

## 2017-04-15 DIAGNOSIS — Z23 Encounter for immunization: Secondary | ICD-10-CM | POA: Diagnosis not present

## 2017-05-24 ENCOUNTER — Other Ambulatory Visit: Payer: Self-pay

## 2017-05-24 ENCOUNTER — Encounter (HOSPITAL_COMMUNITY): Payer: Self-pay | Admitting: Emergency Medicine

## 2017-05-24 ENCOUNTER — Emergency Department (HOSPITAL_COMMUNITY)
Admission: EM | Admit: 2017-05-24 | Discharge: 2017-05-24 | Disposition: A | Discharge status: Home / Self Care | Payer: PPO | Attending: Emergency Medicine | Admitting: Emergency Medicine

## 2017-05-24 DIAGNOSIS — J45909 Unspecified asthma, uncomplicated: Secondary | ICD-10-CM | POA: Diagnosis not present

## 2017-05-24 DIAGNOSIS — L509 Urticaria, unspecified: Secondary | ICD-10-CM | POA: Diagnosis not present

## 2017-05-24 DIAGNOSIS — Z96651 Presence of right artificial knee joint: Secondary | ICD-10-CM | POA: Insufficient documentation

## 2017-05-24 DIAGNOSIS — Z79899 Other long term (current) drug therapy: Secondary | ICD-10-CM | POA: Insufficient documentation

## 2017-05-24 LAB — BASIC METABOLIC PANEL
Anion gap: 8 (ref 5–15)
BUN: 12 mg/dL (ref 6–20)
CHLORIDE: 102 mmol/L (ref 101–111)
CO2: 27 mmol/L (ref 22–32)
Calcium: 9 mg/dL (ref 8.9–10.3)
Creatinine, Ser: 0.82 mg/dL (ref 0.44–1.00)
GFR calc Af Amer: >60 mL/min (ref >60)
GFR calc non Af Amer: >60 mL/min (ref >60)
GLUCOSE: 112 mg/dL — AB (ref 65–99)
POTASSIUM: 3.3 mmol/L — AB (ref 3.5–5.1)
Sodium: 137 mmol/L (ref 135–145)

## 2017-05-24 LAB — CBC
HEMATOCRIT: 44.2 % (ref 36.0–46.0)
HEMOGLOBIN: 14.3 g/dL (ref 12.0–15.0)
MCH: 28.7 pg (ref 26.0–34.0)
MCHC: 32.4 g/dL (ref 30.0–36.0)
MCV: 88.6 fL (ref 78.0–100.0)
Platelets: 266 10*3/uL (ref 150–400)
RBC: 4.99 MIL/uL (ref 3.87–5.11)
RDW: 13.6 % (ref 11.5–15.5)
WBC: 9.9 10*3/uL (ref 4.0–10.5)

## 2017-05-24 MED ORDER — METHYLPREDNISOLONE SODIUM SUCC 125 MG IJ SOLR
125.0000 mg | Freq: Once | INTRAMUSCULAR | Status: AC
  Administered 2017-05-24: 125 mg via INTRAVENOUS
  Filled 2017-05-24: qty 2

## 2017-05-24 MED ORDER — FAMOTIDINE IN NACL 20-0.9 MG/50ML-% IV SOLN
20.0000 mg | Freq: Once | INTRAVENOUS | Status: AC
  Administered 2017-05-24: 20 mg via INTRAVENOUS
  Filled 2017-05-24: qty 50

## 2017-05-24 MED ORDER — POTASSIUM CHLORIDE CRYS ER 20 MEQ PO TBCR: 40.0000 meq | EXTENDED_RELEASE_TABLET | Freq: Once | ORAL | Status: DC

## 2017-05-24 MED ORDER — DIPHENHYDRAMINE HCL 50 MG/ML IJ SOLN
25.0000 mg | Freq: Once | INTRAMUSCULAR | Status: AC
  Administered 2017-05-24: 25 mg via INTRAVENOUS
  Filled 2017-05-24: qty 1

## 2017-05-24 MED ORDER — PREDNISONE 10 MG (21) PO TBPK: ORAL_TABLET | Freq: Every day | ORAL | 0 refills | Status: DC

## 2017-05-24 MED ORDER — POTASSIUM CHLORIDE CRYS ER 20 MEQ PO TBCR
40.0000 meq | EXTENDED_RELEASE_TABLET | Freq: Once | ORAL | Status: AC
  Administered 2017-05-24: 40 meq via ORAL
  Filled 2017-05-24: qty 2

## 2017-05-24 NOTE — ED Provider Notes (Signed)
Patient with diffuse urticaria onset last night.  She reports "I have gotten hives all my life whenever I get stressed" she also complains of dysuria which is chronic stating she has mesh in her bladder which is in the process of being removed in a stepwise fashion by urology, though currently she refusing further procedures.  She denies any dyspnea denies sensation of throat swelling or voice change.  On exam she is alert and in no distress HEENT exam no mucosal lesion no swelling of tongue lips or uvula neck supple lungs clear to auscultation heart regular rate and rhythm abdomen nontender skin diffuse urticaria   Orlie Dakin, MD 05/24/17 [phone]

## 2017-05-24 NOTE — ED Provider Notes (Signed)
[facility] EMERGENCY DEPARTMENT Provider Note   CSN: [id number] Arrival date & time: 05/24/17  5400     History   Chief Complaint Chief Complaint  Patient presents with  . Urticaria    HPI Cassandra [name] is a 71 y.o. female who presents with cc of Hives. She has a hx of mutiple allergies including anaphylaxis, but is unsure of what triggered her last night. The patient developed hives around 8PM suddenly. She took 25mg  of benadryl at 8PM, 1AM and 4:30 AM. She has had little to no improvement. The patient has a hx of recurrent UTIs and has had increased urinary frequency and buring as of yesterday. She has a hx of bladder stones. She deneis fever, back pain, n/v,syncope, oral swelling, or wheezing. She denies any new meds, changes in lotions or soaps.  HPI  Past Medical History:  Diagnosis Date  . Anemia    SOMETIMES ANEMIC  . Arthritis   . Asthma    PT STATES HER ASTHMA IS MILD-HAS PRO AIR INHALER TO USE IF NEEDED  . Depression   . Diarrhea    PROBLEMS WITH DIARRHEA  & STOMACH "LOUD NOISES" - NO PAIN --PT WAS PUT ON DRUG CREON- FEELS THAT IT HAS HELPED  . Diverticulosis   . Fibromyalgia   . GERD (gastroesophageal reflux disease)   . OSA (obstructive sleep apnea)    hasn't used in several years  . PONV (postoperative nausea and vomiting)   . RLS (restless legs syndrome)   . Seasonal allergies     Patient Active Problem List   Diagnosis Date Noted  . BPPV (benign paroxysmal positional vertigo) 10/23/2012  . Vertigo 10/21/2012  . UTI (lower urinary tract infection) 10/21/2012  . Nausea & vomiting 10/21/2012  . Postoperative anemia due to acute blood loss 09/14/2012  . OA (osteoarthritis) of knee 09/12/2012  . Insomnia 08/20/2011  . OBSTRUCTIVE SLEEP APNEA 04/23/2008  . RESTLESS LEG SYNDROME 04/23/2008  . FIBROMYALGIA 01/06/2007    Past Surgical History:  Procedure Laterality Date  . BLADDER SUSPENSION  1990's  . bladder tacking  2010   with  mesh  . BREAST LUMPECTOMY Left 1985   Benign cyst  . CYSTOSCOPY N/A 07/12/2016   Procedure: CYSTOSCOPY, VAGINOSCOPY, EXAM UNDER ANESTHESIA, STONE LITHOTRIPSY,;  Surgeon: Cleon Gustin, MD;  Location: [facility];  Service: Urology;  Laterality: N/A;  . CYSTOSCOPY N/A 10/11/2016   Procedure: CYSTOSCOPY;  Surgeon: Cleon Gustin, MD;  Location: [facility];  Service: Urology;  Laterality: N/A;  . CYSTOSCOPY WITH LITHOLAPAXY N/A 10/11/2016   Procedure: CYSTOSCOPY WITH LITHOLAPAXY/ EXCISION OF MESH;  Surgeon: Cleon Gustin, MD;  Location: [facility];  Service: Urology;  Laterality: N/A;  . HOLMIUM LASER APPLICATION  8/67/6195   Procedure: HOLMIUM LASER APPLICATION;  Surgeon: Cleon Gustin, MD;  Location: [facility];  Service: Urology;;  . RIGHT KNEE ARTHROSCOPY    . TOTAL KNEE ARTHROPLASTY Right 09/12/2012   Procedure: RIGHT TOTAL KNEE ARTHROPLASTY;  Surgeon: Gearlean Alf, MD;  Location: [facility];  Service: Orthopedics;  Laterality: Right;  Marland Kitchen VAGINAL HYSTERECTOMY  1984    OB History    No data available       Home Medications    Prior to Admission medications   Medication Sig Start Date End Date Taking? Authorizing Provider  acetaminophen (TYLENOL) 500 MG tablet Take 500 mg by mouth every 4 (four) hours as needed.  [provider]  albuterol (PROVENTIL HFA;VENTOLIN HFA) 108 (90 BASE) MCG/ACT inhaler Inhale 2 puffs into the lungs every 6 (six) hours as needed for wheezing.    [provider]  Calcium Carbonate-Vit D-Min (CALCIUM 1200 PO) Take by mouth.    [provider]  estradiol (ESTRACE) 0.1 MG/GM vaginal cream Place 1 Applicatorful vaginally at bedtime.    [provider]  fexofenadine (ALLEGRA) 30 MG tablet Take 30 mg by mouth daily.    [provider]  meclizine (ANTIVERT) 25 MG tablet Take 1 tablet (25 mg total) by mouth 3 (three) times daily as needed for  dizziness. 10/23/12   Dhungel, Flonnie Overman, MD  naproxen sodium (ANAPROX) 220 MG tablet Take 220 mg by mouth 2 (two) times daily with a meal.    [provider]  omeprazole-sodium bicarbonate (ZEGERID) 40-1100 MG per capsule Take 1 capsule by mouth daily as needed.     [provider]  Polyethyl Glycol-Propyl Glycol (SYSTANE OP) Place 1 drop into both eyes at bedtime as needed (dry eyes).     [provider]  ranitidine (ZANTAC) 150 MG capsule Take 150 mg by mouth 2 (two) times daily.     [provider]  spironolactone (ALDACTONE) 25 MG tablet Take 25 mg by mouth at bedtime. Doesn't take daily    [provider]  traMADol (ULTRAM) 50 MG tablet Take 1 tablet (50 mg total) by mouth every 6 (six) hours as needed. 10/11/16   McKenzie, Candee Furbish, MD  vitamin B-12 (CYANOCOBALAMIN) 100 MCG tablet Take 100 mcg by mouth daily.    [provider]  zolpidem (AMBIEN) 5 MG tablet Take 5 mg by mouth at bedtime as needed for sleep.    [provider]    Family History Family History  Problem Relation Age of Onset  . Kidney cancer Mother   . Diabetes Mother     Social History Social History   Tobacco Use  . Smoking status: Never Smoker  . Smokeless tobacco: Never Used  Substance Use Topics  . Alcohol use: Yes    Alcohol/week: 0.6 oz    Types: 1 Glasses of wine per week  . Drug use: No     Allergies   Shellfish allergy; Sulfonamide derivatives; and Hydrochlorothiazide   Review of Systems Review of Systems Ten systems reviewed and are negative for acute change, except as noted in the HPI.    Physical Exam Updated Vital Signs There were no vitals taken for this visit.  Physical Exam  Constitutional: She is oriented to person, place, and time. She appears well-developed and well-nourished. No distress.  HENT:  Head: Normocephalic and atraumatic.  Mouth/Throat: Uvula is midline and oropharynx is clear and moist. No posterior  oropharyngeal edema.  Eyes: Conjunctivae and EOM are normal. Pupils are equal, round, and reactive to light. No scleral icterus.  Neck: Normal range of motion and phonation normal.  Cardiovascular: Normal rate, regular rhythm and normal heart sounds. Exam reveals no gallop and no friction rub.  No murmur heard. Pulmonary/Chest: Effort normal and breath sounds normal. No stridor. No tachypnea. No respiratory distress. She has no wheezes.  Abdominal: Soft. Bowel sounds are normal. She exhibits no distension and no mass. There is no tenderness. There is no guarding.  Neurological: She is alert and oriented to person, place, and time.  Skin: Skin is warm and dry. Rash noted. Rash is urticarial. She is not diaphoretic.     Psychiatric: Her behavior is normal.  Nursing note and vitals reviewed.    ED Treatments / Results  Labs (all labs ordered are listed, but only abnormal results are displayed) Labs Reviewed  BASIC METABOLIC PANEL  CBC  URINALYSIS, ROUTINE W REFLEX MICROSCOPIC    EKG  EKG Interpretation None       Radiology No results found.  Procedures Procedures (including critical care time)  Medications Ordered in ED Medications  diphenhydrAMINE (BENADRYL) injection 25 mg (not administered)  methylPREDNISolone sodium succinate (SOLU-MEDROL) 125 mg/2 mL injection 125 mg (not administered)  famotidine (PEPCID) IVPB 20 mg premix (not administered)     Initial Impression / Assessment and Plan / ED Course  I have reviewed the triage vital signs and the nursing notes.  Pertinent labs & imaging results that were available during my care of the patient were reviewed by me and considered in my medical decision making (see chart for details).     Patient hives improved with treatment however not fully resolved.  The patient became agitated because her urine was not collected immediately.  She did not wish to stay any further notes that she would call her primary care doctor  because she felt like she was being ignored and was not really that sick.  She wished to be discharged without further evaluation or urinalysis.  Feel the patient is stable to leave and may follow-up with her primary care doctor.  Her potassium is low and has been repleted here with 1 dose of 40 M EQ.  Patient will be given a Sterapred Dosepak.  She appears appropriate for discharge at this time  Final Clinical Impressions(s) / ED Diagnoses   Final diagnoses:  Urticaria    ED Discharge Orders    None       Margarita Mail, PA-C 05/24/17 [place], MD 05/24/17 [phone]

## 2017-05-24 NOTE — ED Triage Notes (Signed)
Pt arrives with complaints of hives and urinary tract infection symptoms.

## 2017-05-24 NOTE — Discharge Instructions (Signed)
Return if you vomit, pass out, or can't breathe

## 2017-05-25 DIAGNOSIS — N390 Urinary tract infection, site not specified: Secondary | ICD-10-CM | POA: Diagnosis not present

## 2017-05-25 DIAGNOSIS — R300 Dysuria: Secondary | ICD-10-CM | POA: Diagnosis not present

## 2017-05-25 DIAGNOSIS — T783XXA Angioneurotic edema, initial encounter: Secondary | ICD-10-CM | POA: Diagnosis not present

## 2017-06-07 DIAGNOSIS — R3915 Urgency of urination: Secondary | ICD-10-CM | POA: Diagnosis not present

## 2017-06-07 DIAGNOSIS — R102 Pelvic and perineal pain: Secondary | ICD-10-CM | POA: Diagnosis not present

## 2017-07-11 DIAGNOSIS — N210 Calculus in bladder: Secondary | ICD-10-CM | POA: Diagnosis not present

## 2017-07-11 DIAGNOSIS — T191XXA Foreign body in bladder, initial encounter: Secondary | ICD-10-CM | POA: Diagnosis not present

## 2017-07-11 DIAGNOSIS — T83718A Erosion of other implanted mesh and other prosthetic materials to surrounding organ or tissue, initial encounter: Secondary | ICD-10-CM | POA: Diagnosis not present

## 2017-07-18 DIAGNOSIS — R3915 Urgency of urination: Secondary | ICD-10-CM | POA: Diagnosis not present

## 2017-07-18 DIAGNOSIS — N210 Calculus in bladder: Secondary | ICD-10-CM | POA: Diagnosis not present

## 2017-08-10 DIAGNOSIS — J45998 Other asthma: Secondary | ICD-10-CM | POA: Diagnosis not present

## 2017-08-10 DIAGNOSIS — J9801 Acute bronchospasm: Secondary | ICD-10-CM | POA: Diagnosis not present

## 2017-08-10 DIAGNOSIS — J45909 Unspecified asthma, uncomplicated: Secondary | ICD-10-CM | POA: Diagnosis not present

## 2017-08-18 DIAGNOSIS — R3915 Urgency of urination: Secondary | ICD-10-CM | POA: Diagnosis not present

## 2017-08-18 DIAGNOSIS — M1712 Unilateral primary osteoarthritis, left knee: Secondary | ICD-10-CM | POA: Diagnosis not present

## 2017-08-18 DIAGNOSIS — N210 Calculus in bladder: Secondary | ICD-10-CM | POA: Diagnosis not present

## 2017-08-18 DIAGNOSIS — R102 Pelvic and perineal pain: Secondary | ICD-10-CM | POA: Diagnosis not present

## 2017-09-07 DIAGNOSIS — J301 Allergic rhinitis due to pollen: Secondary | ICD-10-CM | POA: Diagnosis not present

## 2017-09-07 DIAGNOSIS — L501 Idiopathic urticaria: Secondary | ICD-10-CM | POA: Diagnosis not present

## 2017-09-07 DIAGNOSIS — H1045 Other chronic allergic conjunctivitis: Secondary | ICD-10-CM | POA: Diagnosis not present

## 2017-09-07 DIAGNOSIS — J4530 Mild persistent asthma, uncomplicated: Secondary | ICD-10-CM | POA: Diagnosis not present

## 2017-09-13 ENCOUNTER — Ambulatory Visit
Admission: RE | Admit: 2017-09-13 | Discharge: 2017-09-13 | Disposition: A | Discharge status: Home / Self Care | Payer: PPO | Source: Ambulatory Visit | Attending: Allergy | Admitting: Allergy

## 2017-09-13 ENCOUNTER — Other Ambulatory Visit: Payer: PPO

## 2017-09-13 ENCOUNTER — Other Ambulatory Visit: Payer: Self-pay | Admitting: Allergy

## 2017-09-13 DIAGNOSIS — Z91013 Allergy to seafood: Secondary | ICD-10-CM | POA: Diagnosis not present

## 2017-09-13 DIAGNOSIS — J40 Bronchitis, not specified as acute or chronic: Secondary | ICD-10-CM | POA: Diagnosis not present

## 2017-09-13 DIAGNOSIS — J4530 Mild persistent asthma, uncomplicated: Secondary | ICD-10-CM

## 2017-09-19 DIAGNOSIS — J301 Allergic rhinitis due to pollen: Secondary | ICD-10-CM | POA: Diagnosis not present

## 2017-09-19 DIAGNOSIS — J3089 Other allergic rhinitis: Secondary | ICD-10-CM | POA: Diagnosis not present

## 2017-09-27 DIAGNOSIS — J301 Allergic rhinitis due to pollen: Secondary | ICD-10-CM | POA: Diagnosis not present

## 2017-09-27 DIAGNOSIS — J3089 Other allergic rhinitis: Secondary | ICD-10-CM | POA: Diagnosis not present

## 2017-09-29 DIAGNOSIS — J301 Allergic rhinitis due to pollen: Secondary | ICD-10-CM | POA: Diagnosis not present

## 2017-09-29 DIAGNOSIS — J3089 Other allergic rhinitis: Secondary | ICD-10-CM | POA: Diagnosis not present

## 2017-10-04 DIAGNOSIS — J301 Allergic rhinitis due to pollen: Secondary | ICD-10-CM | POA: Diagnosis not present

## 2017-10-04 DIAGNOSIS — J3089 Other allergic rhinitis: Secondary | ICD-10-CM | POA: Diagnosis not present

## 2017-10-05 DIAGNOSIS — Z1231 Encounter for screening mammogram for malignant neoplasm of breast: Secondary | ICD-10-CM | POA: Diagnosis not present

## 2017-10-06 DIAGNOSIS — J3089 Other allergic rhinitis: Secondary | ICD-10-CM | POA: Diagnosis not present

## 2017-10-06 DIAGNOSIS — J301 Allergic rhinitis due to pollen: Secondary | ICD-10-CM | POA: Diagnosis not present

## 2017-10-11 DIAGNOSIS — J301 Allergic rhinitis due to pollen: Secondary | ICD-10-CM | POA: Diagnosis not present

## 2017-10-11 DIAGNOSIS — J3089 Other allergic rhinitis: Secondary | ICD-10-CM | POA: Diagnosis not present

## 2017-10-13 DIAGNOSIS — J301 Allergic rhinitis due to pollen: Secondary | ICD-10-CM | POA: Diagnosis not present

## 2017-10-13 DIAGNOSIS — J3089 Other allergic rhinitis: Secondary | ICD-10-CM | POA: Diagnosis not present

## 2017-10-18 DIAGNOSIS — J301 Allergic rhinitis due to pollen: Secondary | ICD-10-CM | POA: Diagnosis not present

## 2017-10-18 DIAGNOSIS — J3089 Other allergic rhinitis: Secondary | ICD-10-CM | POA: Diagnosis not present

## 2017-10-21 DIAGNOSIS — J301 Allergic rhinitis due to pollen: Secondary | ICD-10-CM | POA: Diagnosis not present

## 2017-10-21 DIAGNOSIS — J3089 Other allergic rhinitis: Secondary | ICD-10-CM | POA: Diagnosis not present

## 2017-10-25 DIAGNOSIS — J301 Allergic rhinitis due to pollen: Secondary | ICD-10-CM | POA: Diagnosis not present

## 2017-10-25 DIAGNOSIS — J3089 Other allergic rhinitis: Secondary | ICD-10-CM | POA: Diagnosis not present

## 2017-10-27 DIAGNOSIS — J301 Allergic rhinitis due to pollen: Secondary | ICD-10-CM | POA: Diagnosis not present

## 2017-10-27 DIAGNOSIS — J3089 Other allergic rhinitis: Secondary | ICD-10-CM | POA: Diagnosis not present

## 2017-11-01 DIAGNOSIS — J3089 Other allergic rhinitis: Secondary | ICD-10-CM | POA: Diagnosis not present

## 2017-11-01 DIAGNOSIS — J301 Allergic rhinitis due to pollen: Secondary | ICD-10-CM | POA: Diagnosis not present

## 2017-11-03 DIAGNOSIS — J301 Allergic rhinitis due to pollen: Secondary | ICD-10-CM | POA: Diagnosis not present

## 2017-11-03 DIAGNOSIS — J3089 Other allergic rhinitis: Secondary | ICD-10-CM | POA: Diagnosis not present

## 2017-11-08 DIAGNOSIS — J301 Allergic rhinitis due to pollen: Secondary | ICD-10-CM | POA: Diagnosis not present

## 2017-11-08 DIAGNOSIS — J3089 Other allergic rhinitis: Secondary | ICD-10-CM | POA: Diagnosis not present

## 2017-11-10 DIAGNOSIS — J301 Allergic rhinitis due to pollen: Secondary | ICD-10-CM | POA: Diagnosis not present

## 2017-11-10 DIAGNOSIS — J3089 Other allergic rhinitis: Secondary | ICD-10-CM | POA: Diagnosis not present

## 2017-11-15 DIAGNOSIS — J301 Allergic rhinitis due to pollen: Secondary | ICD-10-CM | POA: Diagnosis not present

## 2017-11-15 DIAGNOSIS — J3089 Other allergic rhinitis: Secondary | ICD-10-CM | POA: Diagnosis not present

## 2017-11-18 DIAGNOSIS — R102 Pelvic and perineal pain: Secondary | ICD-10-CM | POA: Diagnosis not present

## 2017-11-18 DIAGNOSIS — N210 Calculus in bladder: Secondary | ICD-10-CM | POA: Diagnosis not present

## 2017-11-22 DIAGNOSIS — J301 Allergic rhinitis due to pollen: Secondary | ICD-10-CM | POA: Diagnosis not present

## 2017-11-22 DIAGNOSIS — J3089 Other allergic rhinitis: Secondary | ICD-10-CM | POA: Diagnosis not present

## 2017-11-25 DIAGNOSIS — J301 Allergic rhinitis due to pollen: Secondary | ICD-10-CM | POA: Diagnosis not present

## 2017-11-25 DIAGNOSIS — J3089 Other allergic rhinitis: Secondary | ICD-10-CM | POA: Diagnosis not present

## 2017-11-29 DIAGNOSIS — J301 Allergic rhinitis due to pollen: Secondary | ICD-10-CM | POA: Diagnosis not present

## 2017-11-29 DIAGNOSIS — J3089 Other allergic rhinitis: Secondary | ICD-10-CM | POA: Diagnosis not present

## 2017-11-30 ENCOUNTER — Encounter: Payer: Self-pay | Admitting: *Deleted

## 2017-11-30 ENCOUNTER — Other Ambulatory Visit: Payer: Self-pay | Admitting: *Deleted

## 2017-11-30 NOTE — Patient Outreach (Signed)
[facility]) Care Management  11/30/2017  Cassandra [name] 1945/10/17 [id number]  Referral from [place]- Reason: Member requesting resources while in coverage gap:  Telephone call to patient; left HIPPA compliant voice mail requesting return call.  Plan: Geophysicist/field seismologist. Follow up 2-4 business days.   Sherrin Daisy, RN BSN [place] Management Coordinator [facility] Care Management  [phone]

## 2017-12-05 DIAGNOSIS — J3089 Other allergic rhinitis: Secondary | ICD-10-CM | POA: Diagnosis not present

## 2017-12-05 DIAGNOSIS — Z1272 Encounter for screening for malignant neoplasm of vagina: Secondary | ICD-10-CM | POA: Diagnosis not present

## 2017-12-05 DIAGNOSIS — Z6834 Body mass index (BMI) 34.0-34.9, adult: Secondary | ICD-10-CM | POA: Diagnosis not present

## 2017-12-05 DIAGNOSIS — Z779 Other contact with and (suspected) exposures hazardous to health: Secondary | ICD-10-CM | POA: Diagnosis not present

## 2017-12-05 DIAGNOSIS — J301 Allergic rhinitis due to pollen: Secondary | ICD-10-CM | POA: Diagnosis not present

## 2017-12-07 ENCOUNTER — Other Ambulatory Visit: Payer: Self-pay | Admitting: *Deleted

## 2017-12-07 NOTE — Patient Outreach (Signed)
[facility]) Care Management  12/07/2017  MACALA BALDONADO 05-14-1946 [id number]  Referral from [place]- Reason: Member requesting resources while in coverage gap:  Telephone call #2; left HIPPA compliant voice mail requesting call back.  Plan: Follow up in 2-4 business days. Outreach letter was sent 7/3.  Sherrin Daisy, RN BSN [place] Management Coordinator [facility] Care Management  [phone]

## 2017-12-09 DIAGNOSIS — J3089 Other allergic rhinitis: Secondary | ICD-10-CM | POA: Diagnosis not present

## 2017-12-09 DIAGNOSIS — J301 Allergic rhinitis due to pollen: Secondary | ICD-10-CM | POA: Diagnosis not present

## 2017-12-13 ENCOUNTER — Other Ambulatory Visit: Payer: Self-pay | Admitting: *Deleted

## 2017-12-13 DIAGNOSIS — J301 Allergic rhinitis due to pollen: Secondary | ICD-10-CM | POA: Diagnosis not present

## 2017-12-13 DIAGNOSIS — J3089 Other allergic rhinitis: Secondary | ICD-10-CM | POA: Diagnosis not present

## 2017-12-13 NOTE — Patient Outreach (Signed)
[place] [facility]) Care Management  12/13/2017  Cassandra [name] 05/26/1946 [id number]  Referral from [place]- Reason: Member requesting resources while in coverage gap:  Telephone call #3; left voice message on alternate number.  Plan: Follow up 2-4 business days. Outreach letter sent 11/30/2017.  Sherrin Daisy, RN BSN [place] Management Coordinator [facility] Care Management  [phone]

## 2017-12-15 DIAGNOSIS — J3089 Other allergic rhinitis: Secondary | ICD-10-CM | POA: Diagnosis not present

## 2017-12-15 DIAGNOSIS — J301 Allergic rhinitis due to pollen: Secondary | ICD-10-CM | POA: Diagnosis not present

## 2017-12-20 DIAGNOSIS — J301 Allergic rhinitis due to pollen: Secondary | ICD-10-CM | POA: Diagnosis not present

## 2017-12-20 DIAGNOSIS — J3089 Other allergic rhinitis: Secondary | ICD-10-CM | POA: Diagnosis not present

## 2017-12-23 ENCOUNTER — Other Ambulatory Visit: Payer: Self-pay | Admitting: *Deleted

## 2017-12-23 NOTE — Patient Outreach (Signed)
[place] [facility]) Care Management  12/23/2017  Cassandra [name] 07-Jun-1945 [id number]  Unsuccessful call attempts. No response to outreach letter.  Plan Case closure.  Sherrin Daisy, RN BSN [place] Management Coordinator [facility] Care Management  [phone]

## 2017-12-27 DIAGNOSIS — J3089 Other allergic rhinitis: Secondary | ICD-10-CM | POA: Diagnosis not present

## 2017-12-27 DIAGNOSIS — J301 Allergic rhinitis due to pollen: Secondary | ICD-10-CM | POA: Diagnosis not present

## 2017-12-28 DIAGNOSIS — J3089 Other allergic rhinitis: Secondary | ICD-10-CM | POA: Diagnosis not present

## 2017-12-28 DIAGNOSIS — J4530 Mild persistent asthma, uncomplicated: Secondary | ICD-10-CM | POA: Diagnosis not present

## 2017-12-28 DIAGNOSIS — J301 Allergic rhinitis due to pollen: Secondary | ICD-10-CM | POA: Diagnosis not present

## 2017-12-28 DIAGNOSIS — Z91013 Allergy to seafood: Secondary | ICD-10-CM | POA: Diagnosis not present

## 2018-01-04 DIAGNOSIS — R87610 Atypical squamous cells of undetermined significance on cytologic smear of cervix (ASC-US): Secondary | ICD-10-CM | POA: Diagnosis not present

## 2018-01-04 DIAGNOSIS — J3089 Other allergic rhinitis: Secondary | ICD-10-CM | POA: Diagnosis not present

## 2018-01-04 DIAGNOSIS — J301 Allergic rhinitis due to pollen: Secondary | ICD-10-CM | POA: Diagnosis not present

## 2018-01-09 DIAGNOSIS — J3089 Other allergic rhinitis: Secondary | ICD-10-CM | POA: Diagnosis not present

## 2018-01-09 DIAGNOSIS — J301 Allergic rhinitis due to pollen: Secondary | ICD-10-CM | POA: Diagnosis not present

## 2018-01-10 DIAGNOSIS — N390 Urinary tract infection, site not specified: Secondary | ICD-10-CM | POA: Diagnosis not present

## 2018-01-10 DIAGNOSIS — E785 Hyperlipidemia, unspecified: Secondary | ICD-10-CM | POA: Diagnosis not present

## 2018-01-10 DIAGNOSIS — E559 Vitamin D deficiency, unspecified: Secondary | ICD-10-CM | POA: Diagnosis not present

## 2018-01-10 DIAGNOSIS — Z0001 Encounter for general adult medical examination with abnormal findings: Secondary | ICD-10-CM | POA: Diagnosis not present

## 2018-01-17 DIAGNOSIS — E785 Hyperlipidemia, unspecified: Secondary | ICD-10-CM | POA: Diagnosis not present

## 2018-01-17 DIAGNOSIS — Z0000 Encounter for general adult medical examination without abnormal findings: Secondary | ICD-10-CM | POA: Diagnosis not present

## 2018-01-17 DIAGNOSIS — J45909 Unspecified asthma, uncomplicated: Secondary | ICD-10-CM | POA: Diagnosis not present

## 2018-01-17 DIAGNOSIS — G4700 Insomnia, unspecified: Secondary | ICD-10-CM | POA: Diagnosis not present

## 2018-01-17 DIAGNOSIS — M8580 Other specified disorders of bone density and structure, unspecified site: Secondary | ICD-10-CM | POA: Diagnosis not present

## 2018-01-17 DIAGNOSIS — E559 Vitamin D deficiency, unspecified: Secondary | ICD-10-CM | POA: Diagnosis not present

## 2018-01-17 DIAGNOSIS — J3089 Other allergic rhinitis: Secondary | ICD-10-CM | POA: Diagnosis not present

## 2018-01-17 DIAGNOSIS — J301 Allergic rhinitis due to pollen: Secondary | ICD-10-CM | POA: Diagnosis not present

## 2018-01-24 DIAGNOSIS — M8580 Other specified disorders of bone density and structure, unspecified site: Secondary | ICD-10-CM | POA: Diagnosis not present

## 2018-01-24 DIAGNOSIS — J3089 Other allergic rhinitis: Secondary | ICD-10-CM | POA: Diagnosis not present

## 2018-01-24 DIAGNOSIS — J301 Allergic rhinitis due to pollen: Secondary | ICD-10-CM | POA: Diagnosis not present

## 2018-01-26 DIAGNOSIS — J3089 Other allergic rhinitis: Secondary | ICD-10-CM | POA: Diagnosis not present

## 2018-01-26 DIAGNOSIS — J301 Allergic rhinitis due to pollen: Secondary | ICD-10-CM | POA: Diagnosis not present

## 2018-01-31 DIAGNOSIS — J301 Allergic rhinitis due to pollen: Secondary | ICD-10-CM | POA: Diagnosis not present

## 2018-01-31 DIAGNOSIS — J3089 Other allergic rhinitis: Secondary | ICD-10-CM | POA: Diagnosis not present

## 2018-02-07 DIAGNOSIS — J3089 Other allergic rhinitis: Secondary | ICD-10-CM | POA: Diagnosis not present

## 2018-02-07 DIAGNOSIS — J301 Allergic rhinitis due to pollen: Secondary | ICD-10-CM | POA: Diagnosis not present

## 2018-02-10 DIAGNOSIS — Z23 Encounter for immunization: Secondary | ICD-10-CM | POA: Diagnosis not present

## 2018-02-14 DIAGNOSIS — J301 Allergic rhinitis due to pollen: Secondary | ICD-10-CM | POA: Diagnosis not present

## 2018-02-14 DIAGNOSIS — J3089 Other allergic rhinitis: Secondary | ICD-10-CM | POA: Diagnosis not present

## 2018-02-21 DIAGNOSIS — J301 Allergic rhinitis due to pollen: Secondary | ICD-10-CM | POA: Diagnosis not present

## 2018-02-21 DIAGNOSIS — J3089 Other allergic rhinitis: Secondary | ICD-10-CM | POA: Diagnosis not present

## 2018-03-01 DIAGNOSIS — J301 Allergic rhinitis due to pollen: Secondary | ICD-10-CM | POA: Diagnosis not present

## 2018-03-01 DIAGNOSIS — J3089 Other allergic rhinitis: Secondary | ICD-10-CM | POA: Diagnosis not present

## 2018-03-07 DIAGNOSIS — J3089 Other allergic rhinitis: Secondary | ICD-10-CM | POA: Diagnosis not present

## 2018-03-07 DIAGNOSIS — J301 Allergic rhinitis due to pollen: Secondary | ICD-10-CM | POA: Diagnosis not present

## 2018-03-09 DIAGNOSIS — R102 Pelvic and perineal pain: Secondary | ICD-10-CM | POA: Diagnosis not present

## 2018-03-09 DIAGNOSIS — N210 Calculus in bladder: Secondary | ICD-10-CM | POA: Diagnosis not present

## 2018-03-15 DIAGNOSIS — J3089 Other allergic rhinitis: Secondary | ICD-10-CM | POA: Diagnosis not present

## 2018-03-15 DIAGNOSIS — J301 Allergic rhinitis due to pollen: Secondary | ICD-10-CM | POA: Diagnosis not present

## 2018-03-21 DIAGNOSIS — J301 Allergic rhinitis due to pollen: Secondary | ICD-10-CM | POA: Diagnosis not present

## 2018-03-21 DIAGNOSIS — J3089 Other allergic rhinitis: Secondary | ICD-10-CM | POA: Diagnosis not present

## 2018-03-28 DIAGNOSIS — J301 Allergic rhinitis due to pollen: Secondary | ICD-10-CM | POA: Diagnosis not present

## 2018-03-28 DIAGNOSIS — J3089 Other allergic rhinitis: Secondary | ICD-10-CM | POA: Diagnosis not present

## 2018-04-04 DIAGNOSIS — J301 Allergic rhinitis due to pollen: Secondary | ICD-10-CM | POA: Diagnosis not present

## 2018-04-04 DIAGNOSIS — J3089 Other allergic rhinitis: Secondary | ICD-10-CM | POA: Diagnosis not present

## 2018-04-11 DIAGNOSIS — J3089 Other allergic rhinitis: Secondary | ICD-10-CM | POA: Diagnosis not present

## 2018-04-11 DIAGNOSIS — J301 Allergic rhinitis due to pollen: Secondary | ICD-10-CM | POA: Diagnosis not present

## 2018-04-18 DIAGNOSIS — J301 Allergic rhinitis due to pollen: Secondary | ICD-10-CM | POA: Diagnosis not present

## 2018-04-18 DIAGNOSIS — J3089 Other allergic rhinitis: Secondary | ICD-10-CM | POA: Diagnosis not present

## 2018-04-25 DIAGNOSIS — J3089 Other allergic rhinitis: Secondary | ICD-10-CM | POA: Diagnosis not present

## 2018-04-25 DIAGNOSIS — J301 Allergic rhinitis due to pollen: Secondary | ICD-10-CM | POA: Diagnosis not present

## 2018-05-03 DIAGNOSIS — J3089 Other allergic rhinitis: Secondary | ICD-10-CM | POA: Diagnosis not present

## 2018-05-03 DIAGNOSIS — J301 Allergic rhinitis due to pollen: Secondary | ICD-10-CM | POA: Diagnosis not present

## 2018-05-04 ENCOUNTER — Encounter: Payer: Self-pay | Admitting: Podiatry

## 2018-05-04 ENCOUNTER — Ambulatory Visit: Payer: PPO | Admitting: Podiatry

## 2018-05-04 VITALS — BP 123/73

## 2018-05-04 DIAGNOSIS — M79675 Pain in left toe(s): Secondary | ICD-10-CM | POA: Diagnosis not present

## 2018-05-04 DIAGNOSIS — M79674 Pain in right toe(s): Secondary | ICD-10-CM

## 2018-05-04 DIAGNOSIS — E119 Type 2 diabetes mellitus without complications: Secondary | ICD-10-CM

## 2018-05-04 DIAGNOSIS — B351 Tinea unguium: Secondary | ICD-10-CM

## 2018-05-05 DIAGNOSIS — M1712 Unilateral primary osteoarthritis, left knee: Secondary | ICD-10-CM | POA: Diagnosis not present

## 2018-05-05 MED ORDER — NONFORMULARY OR COMPOUNDED ITEM: 5 refills | Status: DC

## 2018-05-09 DIAGNOSIS — J3089 Other allergic rhinitis: Secondary | ICD-10-CM | POA: Diagnosis not present

## 2018-05-09 DIAGNOSIS — J301 Allergic rhinitis due to pollen: Secondary | ICD-10-CM | POA: Diagnosis not present

## 2018-05-16 DIAGNOSIS — J3089 Other allergic rhinitis: Secondary | ICD-10-CM | POA: Diagnosis not present

## 2018-05-16 DIAGNOSIS — J301 Allergic rhinitis due to pollen: Secondary | ICD-10-CM | POA: Diagnosis not present

## 2018-05-22 DIAGNOSIS — J3089 Other allergic rhinitis: Secondary | ICD-10-CM | POA: Diagnosis not present

## 2018-05-22 DIAGNOSIS — J301 Allergic rhinitis due to pollen: Secondary | ICD-10-CM | POA: Diagnosis not present

## 2018-05-29 DIAGNOSIS — J3089 Other allergic rhinitis: Secondary | ICD-10-CM | POA: Diagnosis not present

## 2018-05-29 DIAGNOSIS — J301 Allergic rhinitis due to pollen: Secondary | ICD-10-CM | POA: Diagnosis not present

## 2018-05-30 DIAGNOSIS — D225 Melanocytic nevi of trunk: Secondary | ICD-10-CM | POA: Diagnosis not present

## 2018-05-30 DIAGNOSIS — L814 Other melanin hyperpigmentation: Secondary | ICD-10-CM | POA: Diagnosis not present

## 2018-05-30 DIAGNOSIS — L650 Telogen effluvium: Secondary | ICD-10-CM | POA: Diagnosis not present

## 2018-05-30 DIAGNOSIS — D3610 Benign neoplasm of peripheral nerves and autonomic nervous system, unspecified: Secondary | ICD-10-CM | POA: Diagnosis not present

## 2018-05-30 DIAGNOSIS — L821 Other seborrheic keratosis: Secondary | ICD-10-CM | POA: Diagnosis not present

## 2018-05-30 DIAGNOSIS — D1801 Hemangioma of skin and subcutaneous tissue: Secondary | ICD-10-CM | POA: Diagnosis not present

## 2018-05-30 DIAGNOSIS — B351 Tinea unguium: Secondary | ICD-10-CM | POA: Diagnosis not present

## 2018-05-30 DIAGNOSIS — L03019 Cellulitis of unspecified finger: Secondary | ICD-10-CM | POA: Diagnosis not present

## 2018-05-30 DIAGNOSIS — L820 Inflamed seborrheic keratosis: Secondary | ICD-10-CM | POA: Diagnosis not present

## 2018-06-03 ENCOUNTER — Encounter: Payer: Self-pay | Admitting: Podiatry

## 2018-06-03 NOTE — Progress Notes (Signed)
Subjective: Cassandra [name] presents today referred by Deland Pretty, MD with diabetes and cc of painful, discolored, thick toenails which interfere with daily activities. Most symptomatic is her left great toe which she feels is ingrown. It throbs most times. She denies any drainage from digit.  Pain is aggravated when wearing enclosed shoe gear.   Past Medical History:  Diagnosis Date  . Anemia    SOMETIMES ANEMIC  . Arthritis   . Asthma    PT STATES HER ASTHMA IS MILD-HAS PRO AIR INHALER TO USE IF NEEDED  . Depression   . Diarrhea    PROBLEMS WITH DIARRHEA  & STOMACH "LOUD NOISES" - NO PAIN --PT WAS PUT ON DRUG CREON- FEELS THAT IT HAS HELPED  . Diverticulosis   . Fibromyalgia   . GERD (gastroesophageal reflux disease)   . OSA (obstructive sleep apnea)    hasn't used in several years  . PONV (postoperative nausea and vomiting)   . RLS (restless legs syndrome)   . Seasonal allergies     Patient Active Problem List   Diagnosis Date Noted  . BPPV (benign paroxysmal positional vertigo) 10/23/2012  . Vertigo 10/21/2012  . UTI (lower urinary tract infection) 10/21/2012  . Nausea & vomiting 10/21/2012  . Postoperative anemia due to acute blood loss 09/14/2012  . OA (osteoarthritis) of knee 09/12/2012  . Insomnia 08/20/2011  . OBSTRUCTIVE SLEEP APNEA 04/23/2008  . RESTLESS LEG SYNDROME 04/23/2008  . FIBROMYALGIA 01/06/2007    Past Surgical History:  Procedure Laterality Date  . BLADDER SUSPENSION  1990's  . bladder tacking  2010   with mesh  . BREAST LUMPECTOMY Left 1985   Benign cyst  . CYSTOSCOPY N/A 07/12/2016   Procedure: CYSTOSCOPY, VAGINOSCOPY, EXAM UNDER ANESTHESIA, STONE LITHOTRIPSY,;  Surgeon: Cleon Gustin, MD;  Location: [facility];  Service: Urology;  Laterality: N/A;  . CYSTOSCOPY N/A 10/11/2016   Procedure: CYSTOSCOPY;  Surgeon: Cleon Gustin, MD;  Location: [facility];  Service: Urology;  Laterality: N/A;  . CYSTOSCOPY  WITH LITHOLAPAXY N/A 10/11/2016   Procedure: CYSTOSCOPY WITH LITHOLAPAXY/ EXCISION OF MESH;  Surgeon: Cleon Gustin, MD;  Location: [facility];  Service: Urology;  Laterality: N/A;  . HOLMIUM LASER APPLICATION  1/61/0960   Procedure: HOLMIUM LASER APPLICATION;  Surgeon: Cleon Gustin, MD;  Location: [facility];  Service: Urology;;  . RIGHT KNEE ARTHROSCOPY    . TOTAL KNEE ARTHROPLASTY Right 09/12/2012   Procedure: RIGHT TOTAL KNEE ARTHROPLASTY;  Surgeon: Gearlean Alf, MD;  Location: [facility];  Service: Orthopedics;  Laterality: Right;  Marland Kitchen VAGINAL HYSTERECTOMY  1984     Current Outpatient Medications:  .  albuterol (PROVENTIL HFA;VENTOLIN HFA) 108 (90 BASE) MCG/ACT inhaler, Inhale 2 puffs into the lungs every 6 (six) hours as needed for wheezing., Disp: , Rfl:  .  BREO ELLIPTA 200-25 MCG/INH AEPB, INHALE 1 PUFF ONCE A DAY, Disp: , Rfl: 1 .  levocetirizine (XYZAL) 5 MG tablet, every evening., Disp: , Rfl: 1 .  MYRBETRIQ 50 MG TB24 tablet, Take 50 mg by mouth daily., Disp: , Rfl: 11 .  vitamin B-12 (CYANOCOBALAMIN) 100 MCG tablet, Take 100 mcg by mouth daily., Disp: , Rfl:  .  zolpidem (AMBIEN) 5 MG tablet, Take 5 mg by mouth at bedtime as needed for sleep., Disp: , Rfl:  .  acetaminophen (TYLENOL) 500 MG tablet, Take 500 mg by mouth every 4 (four) hours as needed. , Disp: , Rfl:  .  estradiol (ESTRACE) 0.1 MG/GM vaginal cream, Place 1 Applicatorful vaginally at bedtime., Disp: , Rfl:  .  fexofenadine (ALLEGRA) 30 MG tablet, Take 30 mg by mouth daily., Disp: , Rfl:  .  meclizine (ANTIVERT) 25 MG tablet, Take 1 tablet (25 mg total) by mouth 3 (three) times daily as needed for dizziness. (Patient not taking: Reported on 05/04/2018), Disp: 30 tablet, Rfl: 0 .  naproxen sodium (ANAPROX) 220 MG tablet, Take 220 mg by mouth 2 (two) times daily with a meal., Disp: , Rfl:  .  NONFORMULARY OR COMPOUNDED ITEM, [facility]:  Antifungal-nail - Terbinafine 3%,  Fluconazole 2%, Tea Tree Oil 5%, Urea 10%, Ibuprofen 2% in DMSO Suspension. Apply to the affected nails once at bedtime or twice daily., Disp: 30 each, Rfl: 5 .  omeprazole-sodium bicarbonate (ZEGERID) 40-1100 MG per capsule, Take 1 capsule by mouth daily as needed. , Disp: , Rfl:  .  Polyethyl Glycol-Propyl Glycol (SYSTANE OP), Place 1 drop into both eyes at bedtime as needed (dry eyes). , Disp: , Rfl:  .  predniSONE (STERAPRED UNI-PAK 21 TAB) 10 MG (21) TBPK tablet, Take by mouth daily. Take 6 tabs by mouth daily  for 2 days, then 5 tabs for 2 days, then 4 tabs for 2 days, then 3 tabs for 2 days, 2 tabs for 2 days, then 1 tab by mouth daily for 2 days (Patient not taking: Reported on 05/04/2018), Disp: 42 tablet, Rfl: 0 .  spironolactone (ALDACTONE) 25 MG tablet, Take 25 mg by mouth at bedtime. Doesn't take daily, Disp: , Rfl:  .  traMADol (ULTRAM) 50 MG tablet, Take 1 tablet (50 mg total) by mouth every 6 (six) hours as needed. (Patient not taking: Reported on 05/04/2018), Disp: 30 tablet, Rfl: 0  Allergies  Allergen Reactions  . Sulfur Hives  . Shellfish Allergy Hives    HIVES  . Sulfonamide Derivatives Hives  . Cephalosporins Hives  . Hydrochlorothiazide Hives    Unknown  . Penicillins Hives    Social History   Occupational History  . Occupation: Engineer, civil (consulting)  Tobacco Use  . Smoking status: Never Smoker  . Smokeless tobacco: Never Used  Substance and Sexual Activity  . Alcohol use: Yes    Alcohol/week: 1.0 standard drinks    Types: 1 Glasses of wine per week  . Drug use: No  . Sexual activity: Never    Family History  Problem Relation Age of Onset  . Kidney cancer Mother   . Diabetes Mother     Immunization History  Administered Date(s) Administered  . Influenza Whole 03/27/2008, 03/03/2009, 02/17/2010  . Pneumococcal Polysaccharide-23 02/28/2010  . Td 04/03/2009     Review of systems: Positive Findings in bold print.  Constitutional:  chills,  fatigue, fever, sweats, weight change Communication: Optometrist, sign Ecologist, hand writing, iPad/Android device Eyes: diplopia, glare,  light sensitivity, eyeglasses, blindness Ears nose mouth throat: Hard of hearing, deaf, sign language,  vertigo,   bloody nose,  rhinitis,  cold sores, snoring Cardiovascular: HTN, edema, arrhythmia, pacemaker in place, defibrillator in place,  chest pain/tightness, chronic anticoagulation, blood clot Respiratory:  difficulty breathing, denies congestion, SOB, wheezing, cough Gastrointestinal: abdominal pain, diarrhea, nausea, vomiting,  Genitourinary:  nocturia,  pain on urination,  blood in urine, Foley catheter, urinary urgency Musculoskeletal: Uses mobility aid,  cramping, stiff joints, painful joints,  Skin: +changes in toenails, color change dryness, itchy skin, mole changes, or rash  Neurological: numbness, paresthesias, burning in feet, denies fainting,  seizure, change in  speech. denies headaches, memory problems/poor historian, cerebral palsy Endocrine: diabetes, hypothyroidism, hyperthyroidism,  dry mouth, flushing, denies heat intolerance,  cold intolerance,  excessive thirst, denies polyuria,  nocturia Hematological:  easy bleeding,  excessive bleeding, easy bruising, enlarged lymph nodes, on long term blood thinner Allergy/immunological:  hive, frequent infections, multiple drug allergies, seasonal allergies,  Psychiatric:  anxiety, depression, mood disorder, suicidal ideations, hallucinations   Objective: Vascular Examination: Capillary refill time immediate x 10 digits Dorsalis pedis and posterior tibial pulses present b/l No digital hair x 10 digits Skin temperature gradient WNL b/l  Dermatological Examination: Skin with normal turgor, texture and tone b/l  Toenails 1-5 b/l discolored, thick, dystrophic with subungual debris and pain with palpation to nailbeds due to thickness of nails.  Right great toe with incurvated nail  border medial and lateral nailfolds. No erythema, no edema, no drainage.  Musculoskeletal: Muscle strength 5/5 to all LE muscle groups  Neurological: Sensation intact with 10 gram monofilament  Vibratory sensation intact.  Assessment: 1. Painful onychomycosis toenails 1-5 b/l  2. NIDDM  Plan: 1. Discussed diabetic foot care principles. Literature dispensed on today. 2. Toenails 1-5 b/l were debrided in length and girth without iatrogenic bleeding.  Prescription sent to [facility]:   Dose, Route, Frequency: As Directed   Dispense Quantity: 30 each Refills: 5 Fills remaining: --        Sig: [facility]: Antifungal-nail - Terbinafine 3%, Fluconazole 2%, Tea Tree Oil 5%, Urea 10%, Ibuprofen 2% in DMSO Suspension. Apply to the affected nails once at bedtime or twice daily.  3. Offending nail border right great toe debrided and curretaged. Borders cleansed with alcohol. Antibiotic ointment applied. No further treatment required by patient. 4.   Patient to continue soft, supportive shoe gear 5.  Patient to report any pedal injuries to medical professional immediately. 6. Follow up 3 months. Patient/POA to call should there be a concern in the interim.

## 2018-06-06 DIAGNOSIS — J301 Allergic rhinitis due to pollen: Secondary | ICD-10-CM | POA: Diagnosis not present

## 2018-06-06 DIAGNOSIS — J3089 Other allergic rhinitis: Secondary | ICD-10-CM | POA: Diagnosis not present

## 2018-06-13 DIAGNOSIS — J3089 Other allergic rhinitis: Secondary | ICD-10-CM | POA: Diagnosis not present

## 2018-06-13 DIAGNOSIS — J301 Allergic rhinitis due to pollen: Secondary | ICD-10-CM | POA: Diagnosis not present

## 2018-06-21 DIAGNOSIS — J301 Allergic rhinitis due to pollen: Secondary | ICD-10-CM | POA: Diagnosis not present

## 2018-06-21 DIAGNOSIS — J3089 Other allergic rhinitis: Secondary | ICD-10-CM | POA: Diagnosis not present

## 2018-06-27 DIAGNOSIS — J301 Allergic rhinitis due to pollen: Secondary | ICD-10-CM | POA: Diagnosis not present

## 2018-06-27 DIAGNOSIS — J3089 Other allergic rhinitis: Secondary | ICD-10-CM | POA: Diagnosis not present

## 2018-06-28 DIAGNOSIS — J301 Allergic rhinitis due to pollen: Secondary | ICD-10-CM | POA: Diagnosis not present

## 2018-06-28 DIAGNOSIS — J209 Acute bronchitis, unspecified: Secondary | ICD-10-CM | POA: Diagnosis not present

## 2018-06-28 DIAGNOSIS — J4530 Mild persistent asthma, uncomplicated: Secondary | ICD-10-CM | POA: Diagnosis not present

## 2018-06-28 DIAGNOSIS — J3089 Other allergic rhinitis: Secondary | ICD-10-CM | POA: Diagnosis not present

## 2018-07-04 DIAGNOSIS — N890 Mild vaginal dysplasia: Secondary | ICD-10-CM | POA: Diagnosis not present

## 2018-07-04 DIAGNOSIS — J301 Allergic rhinitis due to pollen: Secondary | ICD-10-CM | POA: Diagnosis not present

## 2018-07-04 DIAGNOSIS — J3089 Other allergic rhinitis: Secondary | ICD-10-CM | POA: Diagnosis not present

## 2018-07-04 DIAGNOSIS — R350 Frequency of micturition: Secondary | ICD-10-CM | POA: Diagnosis not present

## 2018-07-04 DIAGNOSIS — L659 Nonscarring hair loss, unspecified: Secondary | ICD-10-CM | POA: Diagnosis not present

## 2018-07-04 DIAGNOSIS — R87612 Low grade squamous intraepithelial lesion on cytologic smear of cervix (LGSIL): Secondary | ICD-10-CM | POA: Diagnosis not present

## 2018-07-11 DIAGNOSIS — J3089 Other allergic rhinitis: Secondary | ICD-10-CM | POA: Diagnosis not present

## 2018-07-11 DIAGNOSIS — J301 Allergic rhinitis due to pollen: Secondary | ICD-10-CM | POA: Diagnosis not present

## 2018-07-13 DIAGNOSIS — J3089 Other allergic rhinitis: Secondary | ICD-10-CM | POA: Diagnosis not present

## 2018-07-13 DIAGNOSIS — J4530 Mild persistent asthma, uncomplicated: Secondary | ICD-10-CM | POA: Diagnosis not present

## 2018-07-13 DIAGNOSIS — Z91013 Allergy to seafood: Secondary | ICD-10-CM | POA: Diagnosis not present

## 2018-07-13 DIAGNOSIS — J301 Allergic rhinitis due to pollen: Secondary | ICD-10-CM | POA: Diagnosis not present

## 2018-07-25 DIAGNOSIS — J209 Acute bronchitis, unspecified: Secondary | ICD-10-CM | POA: Diagnosis not present

## 2018-08-03 DIAGNOSIS — J301 Allergic rhinitis due to pollen: Secondary | ICD-10-CM | POA: Diagnosis not present

## 2018-08-03 DIAGNOSIS — J3089 Other allergic rhinitis: Secondary | ICD-10-CM | POA: Diagnosis not present

## 2018-08-08 DIAGNOSIS — J301 Allergic rhinitis due to pollen: Secondary | ICD-10-CM | POA: Diagnosis not present

## 2018-08-08 DIAGNOSIS — J3089 Other allergic rhinitis: Secondary | ICD-10-CM | POA: Diagnosis not present

## 2018-08-11 DIAGNOSIS — J301 Allergic rhinitis due to pollen: Secondary | ICD-10-CM | POA: Diagnosis not present

## 2018-08-11 DIAGNOSIS — J3089 Other allergic rhinitis: Secondary | ICD-10-CM | POA: Diagnosis not present

## 2018-08-15 DIAGNOSIS — J301 Allergic rhinitis due to pollen: Secondary | ICD-10-CM | POA: Diagnosis not present

## 2018-08-15 DIAGNOSIS — J3089 Other allergic rhinitis: Secondary | ICD-10-CM | POA: Diagnosis not present

## 2018-08-17 DIAGNOSIS — J301 Allergic rhinitis due to pollen: Secondary | ICD-10-CM | POA: Diagnosis not present

## 2018-08-17 DIAGNOSIS — J3089 Other allergic rhinitis: Secondary | ICD-10-CM | POA: Diagnosis not present

## 2018-08-23 DIAGNOSIS — J301 Allergic rhinitis due to pollen: Secondary | ICD-10-CM | POA: Diagnosis not present

## 2018-08-23 DIAGNOSIS — J3089 Other allergic rhinitis: Secondary | ICD-10-CM | POA: Diagnosis not present

## 2018-08-29 DIAGNOSIS — J301 Allergic rhinitis due to pollen: Secondary | ICD-10-CM | POA: Diagnosis not present

## 2018-08-29 DIAGNOSIS — J3089 Other allergic rhinitis: Secondary | ICD-10-CM | POA: Diagnosis not present

## 2018-08-31 DIAGNOSIS — J301 Allergic rhinitis due to pollen: Secondary | ICD-10-CM | POA: Diagnosis not present

## 2018-08-31 DIAGNOSIS — M5412 Radiculopathy, cervical region: Secondary | ICD-10-CM | POA: Diagnosis not present

## 2018-08-31 DIAGNOSIS — J3089 Other allergic rhinitis: Secondary | ICD-10-CM | POA: Diagnosis not present

## 2018-09-05 DIAGNOSIS — J3089 Other allergic rhinitis: Secondary | ICD-10-CM | POA: Diagnosis not present

## 2018-09-05 DIAGNOSIS — J301 Allergic rhinitis due to pollen: Secondary | ICD-10-CM | POA: Diagnosis not present

## 2018-09-22 DIAGNOSIS — J301 Allergic rhinitis due to pollen: Secondary | ICD-10-CM | POA: Diagnosis not present

## 2018-09-22 DIAGNOSIS — J3089 Other allergic rhinitis: Secondary | ICD-10-CM | POA: Diagnosis not present

## 2018-09-26 DIAGNOSIS — J3089 Other allergic rhinitis: Secondary | ICD-10-CM | POA: Diagnosis not present

## 2018-09-26 DIAGNOSIS — J301 Allergic rhinitis due to pollen: Secondary | ICD-10-CM | POA: Diagnosis not present

## 2018-10-05 DIAGNOSIS — J301 Allergic rhinitis due to pollen: Secondary | ICD-10-CM | POA: Diagnosis not present

## 2018-10-05 DIAGNOSIS — J3089 Other allergic rhinitis: Secondary | ICD-10-CM | POA: Diagnosis not present

## 2018-10-10 DIAGNOSIS — J301 Allergic rhinitis due to pollen: Secondary | ICD-10-CM | POA: Diagnosis not present

## 2018-10-10 DIAGNOSIS — J3089 Other allergic rhinitis: Secondary | ICD-10-CM | POA: Diagnosis not present

## 2018-10-18 DIAGNOSIS — J3089 Other allergic rhinitis: Secondary | ICD-10-CM | POA: Diagnosis not present

## 2018-10-18 DIAGNOSIS — J3081 Allergic rhinitis due to animal (cat) (dog) hair and dander: Secondary | ICD-10-CM | POA: Diagnosis not present

## 2018-10-18 DIAGNOSIS — J301 Allergic rhinitis due to pollen: Secondary | ICD-10-CM | POA: Diagnosis not present

## 2018-10-24 DIAGNOSIS — N210 Calculus in bladder: Secondary | ICD-10-CM | POA: Diagnosis not present

## 2018-10-24 DIAGNOSIS — R102 Pelvic and perineal pain: Secondary | ICD-10-CM | POA: Diagnosis not present

## 2018-10-24 DIAGNOSIS — J301 Allergic rhinitis due to pollen: Secondary | ICD-10-CM | POA: Diagnosis not present

## 2018-10-24 DIAGNOSIS — J3089 Other allergic rhinitis: Secondary | ICD-10-CM | POA: Diagnosis not present

## 2018-10-26 DIAGNOSIS — D649 Anemia, unspecified: Secondary | ICD-10-CM | POA: Diagnosis not present

## 2018-10-26 DIAGNOSIS — L659 Nonscarring hair loss, unspecified: Secondary | ICD-10-CM | POA: Diagnosis not present

## 2018-10-28 ENCOUNTER — Encounter (HOSPITAL_BASED_OUTPATIENT_CLINIC_OR_DEPARTMENT_OTHER): Payer: Self-pay | Admitting: Emergency Medicine

## 2018-10-28 ENCOUNTER — Emergency Department (HOSPITAL_BASED_OUTPATIENT_CLINIC_OR_DEPARTMENT_OTHER)
Admission: EM | Admit: 2018-10-28 | Discharge: 2018-10-29 | Disposition: A | Discharge status: Home / Self Care | Payer: PPO | Attending: Emergency Medicine | Admitting: Emergency Medicine

## 2018-10-28 ENCOUNTER — Other Ambulatory Visit: Payer: Self-pay

## 2018-10-28 DIAGNOSIS — S20219A Contusion of unspecified front wall of thorax, initial encounter: Secondary | ICD-10-CM

## 2018-10-28 DIAGNOSIS — R911 Solitary pulmonary nodule: Secondary | ICD-10-CM | POA: Diagnosis not present

## 2018-10-28 DIAGNOSIS — S301XXA Contusion of abdominal wall, initial encounter: Secondary | ICD-10-CM | POA: Insufficient documentation

## 2018-10-28 DIAGNOSIS — Y998 Other external cause status: Secondary | ICD-10-CM | POA: Diagnosis not present

## 2018-10-28 DIAGNOSIS — K862 Cyst of pancreas: Secondary | ICD-10-CM | POA: Insufficient documentation

## 2018-10-28 DIAGNOSIS — S4992XA Unspecified injury of left shoulder and upper arm, initial encounter: Secondary | ICD-10-CM | POA: Diagnosis not present

## 2018-10-28 DIAGNOSIS — J45909 Unspecified asthma, uncomplicated: Secondary | ICD-10-CM | POA: Insufficient documentation

## 2018-10-28 DIAGNOSIS — S3991XA Unspecified injury of abdomen, initial encounter: Secondary | ICD-10-CM | POA: Diagnosis not present

## 2018-10-28 DIAGNOSIS — Y9389 Activity, other specified: Secondary | ICD-10-CM | POA: Diagnosis not present

## 2018-10-28 DIAGNOSIS — S299XXA Unspecified injury of thorax, initial encounter: Secondary | ICD-10-CM | POA: Diagnosis not present

## 2018-10-28 DIAGNOSIS — Y92410 Unspecified street and highway as the place of occurrence of the external cause: Secondary | ICD-10-CM | POA: Insufficient documentation

## 2018-10-28 DIAGNOSIS — S3993XA Unspecified injury of pelvis, initial encounter: Secondary | ICD-10-CM | POA: Diagnosis not present

## 2018-10-28 DIAGNOSIS — S8992XA Unspecified injury of left lower leg, initial encounter: Secondary | ICD-10-CM | POA: Diagnosis not present

## 2018-10-28 DIAGNOSIS — Z79899 Other long term (current) drug therapy: Secondary | ICD-10-CM | POA: Diagnosis not present

## 2018-10-28 DIAGNOSIS — S81012A Laceration without foreign body, left knee, initial encounter: Secondary | ICD-10-CM | POA: Diagnosis not present

## 2018-10-28 DIAGNOSIS — S6992XA Unspecified injury of left wrist, hand and finger(s), initial encounter: Secondary | ICD-10-CM | POA: Diagnosis not present

## 2018-10-28 DIAGNOSIS — Z23 Encounter for immunization: Secondary | ICD-10-CM | POA: Diagnosis not present

## 2018-10-28 DIAGNOSIS — S20212A Contusion of left front wall of thorax, initial encounter: Secondary | ICD-10-CM | POA: Insufficient documentation

## 2018-10-28 MED ORDER — TETANUS-DIPHTH-ACELL PERTUSSIS 5-2.5-18.5 LF-MCG/0.5 IM SUSP
0.5000 mL | Freq: Once | INTRAMUSCULAR | Status: AC
  Administered 2018-10-29: 0.5 mL via INTRAMUSCULAR
  Filled 2018-10-28: qty 0.5

## 2018-10-28 MED ORDER — LIDOCAINE-EPINEPHRINE (PF) 2 %-1:200000 IJ SOLN
10.0000 mL | Freq: Once | INTRAMUSCULAR | Status: AC
  Administered 2018-10-29: 10 mL via INTRADERMAL
  Filled 2018-10-28 (×2): qty 10
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
## 2018-10-28 NOTE — ED Notes (Signed)
ED Provider at bedside. 
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
## 2018-10-29 ENCOUNTER — Other Ambulatory Visit (HOSPITAL_BASED_OUTPATIENT_CLINIC_OR_DEPARTMENT_OTHER): Payer: Self-pay | Admitting: Radiology

## 2018-10-29 ENCOUNTER — Emergency Department (HOSPITAL_BASED_OUTPATIENT_CLINIC_OR_DEPARTMENT_OTHER): Payer: PPO

## 2018-10-29 DIAGNOSIS — S8992XA Unspecified injury of left lower leg, initial encounter: Secondary | ICD-10-CM | POA: Diagnosis not present

## 2018-10-29 DIAGNOSIS — S3991XA Unspecified injury of abdomen, initial encounter: Secondary | ICD-10-CM | POA: Diagnosis not present

## 2018-10-29 DIAGNOSIS — S4992XA Unspecified injury of left shoulder and upper arm, initial encounter: Secondary | ICD-10-CM | POA: Diagnosis not present

## 2018-10-29 DIAGNOSIS — S3993XA Unspecified injury of pelvis, initial encounter: Secondary | ICD-10-CM | POA: Diagnosis not present

## 2018-10-29 DIAGNOSIS — S6992XA Unspecified injury of left wrist, hand and finger(s), initial encounter: Secondary | ICD-10-CM | POA: Diagnosis not present

## 2018-10-29 DIAGNOSIS — S299XXA Unspecified injury of thorax, initial encounter: Secondary | ICD-10-CM | POA: Diagnosis not present

## 2018-10-29 LAB — COMPREHENSIVE METABOLIC PANEL
ALT: 12 U/L (ref 0–44)
AST: 27 U/L (ref 15–41)
Albumin: 4.3 g/dL (ref 3.5–5.0)
Alkaline Phosphatase: 79 U/L (ref 38–126)
Anion gap: 9 (ref 5–15)
BUN: 13 mg/dL (ref 8–23)
CO2: 26 mmol/L (ref 22–32)
Calcium: 9.9 mg/dL (ref 8.9–10.3)
Chloride: 106 mmol/L (ref 98–111)
Creatinine, Ser: 0.81 mg/dL (ref 0.44–1.00)
GFR calc Af Amer: >60 mL/min (ref >60)
GFR calc non Af Amer: >60 mL/min (ref >60)
Glucose, Bld: 99 mg/dL (ref 70–99)
Potassium: 3.3 mmol/L — ABNORMAL LOW (ref 3.5–5.1)
Sodium: 141 mmol/L (ref 135–145)
Total Bilirubin: 0.4 mg/dL (ref 0.3–1.2)
Total Protein: 7.6 g/dL (ref 6.5–8.1)

## 2018-10-29 LAB — CBC
HCT: 43.6 % (ref 36.0–46.0)
Hemoglobin: 13.3 g/dL (ref 12.0–15.0)
MCH: 27.3 pg (ref 26.0–34.0)
MCHC: 30.5 g/dL (ref 30.0–36.0)
MCV: 89.5 fL (ref 80.0–100.0)
Platelets: 260 10*3/uL (ref 150–400)
RBC: 4.87 MIL/uL (ref 3.87–5.11)
RDW: 14.2 % (ref 11.5–15.5)
WBC: 13.7 10*3/uL — ABNORMAL HIGH (ref 4.0–10.5)
nRBC: 0 % (ref 0.0–0.2)

## 2018-10-29 MED ORDER — IOHEXOL 300 MG/ML  SOLN
100.0000 mL | Freq: Once | INTRAMUSCULAR | Status: AC | PRN
  Administered 2018-10-29: 100 mL via INTRAVENOUS
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
## 2018-10-29 NOTE — ED Notes (Signed)
Pt refused XRAY of R elbow. Provider aware. All other imaging completed.
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
## 2018-10-29 NOTE — ED Notes (Signed)
Radiology informed RN that patient refused elbow film. Primary RN and EDP notified.

## 2018-10-29 NOTE — ED Notes (Signed)
MD at bedside suturing

## 2018-11-02 ENCOUNTER — Ambulatory Visit: Payer: PPO | Admitting: Internal Medicine

## 2018-11-02 ENCOUNTER — Ambulatory Visit: Payer: PPO | Admitting: Adult Health

## 2018-11-06 ENCOUNTER — Telehealth: Payer: Self-pay | Admitting: Internal Medicine

## 2018-11-06 DIAGNOSIS — J3089 Other allergic rhinitis: Secondary | ICD-10-CM | POA: Diagnosis not present

## 2018-11-06 DIAGNOSIS — J301 Allergic rhinitis due to pollen: Secondary | ICD-10-CM | POA: Diagnosis not present

## 2018-11-06 NOTE — Telephone Encounter (Signed)
Copied from [place] [phone]. Topic: Quick Communication - See Telephone Encounter >> Nov 06, 2018  4:10 PM Blase Mess A wrote: CRM for notification. See Telephone encounter for: 11/06/18.  Patient is calling to schedule a new Patient CB- [phone] (H) with Dr. Jerilee Hoh. She needs stiches removed. Please advise

## 2018-11-07 NOTE — Telephone Encounter (Signed)
Virtual visit scheduled.  

## 2018-11-08 DIAGNOSIS — M25562 Pain in left knee: Secondary | ICD-10-CM | POA: Diagnosis not present

## 2018-11-08 DIAGNOSIS — Z4802 Encounter for removal of sutures: Secondary | ICD-10-CM | POA: Diagnosis not present

## 2018-11-16 ENCOUNTER — Other Ambulatory Visit: Payer: Self-pay | Admitting: Internal Medicine

## 2018-11-16 ENCOUNTER — Ambulatory Visit (INDEPENDENT_AMBULATORY_CARE_PROVIDER_SITE_OTHER): Payer: PPO | Admitting: Internal Medicine

## 2018-11-16 ENCOUNTER — Other Ambulatory Visit: Payer: Self-pay

## 2018-11-16 DIAGNOSIS — G4700 Insomnia, unspecified: Secondary | ICD-10-CM | POA: Diagnosis not present

## 2018-11-16 DIAGNOSIS — R42 Dizziness and giddiness: Secondary | ICD-10-CM | POA: Diagnosis not present

## 2018-11-16 DIAGNOSIS — R9389 Abnormal findings on diagnostic imaging of other specified body structures: Secondary | ICD-10-CM

## 2018-11-16 DIAGNOSIS — R918 Other nonspecific abnormal finding of lung field: Secondary | ICD-10-CM | POA: Diagnosis not present

## 2018-11-16 DIAGNOSIS — K219 Gastro-esophageal reflux disease without esophagitis: Secondary | ICD-10-CM | POA: Diagnosis not present

## 2018-11-16 DIAGNOSIS — K869 Disease of pancreas, unspecified: Secondary | ICD-10-CM | POA: Diagnosis not present

## 2018-11-16 DIAGNOSIS — J45909 Unspecified asthma, uncomplicated: Secondary | ICD-10-CM

## 2018-11-16 DIAGNOSIS — L659 Nonscarring hair loss, unspecified: Secondary | ICD-10-CM | POA: Diagnosis not present

## 2018-11-16 MED ORDER — OMEPRAZOLE 20 MG PO CPDR: 20.0000 mg | DELAYED_RELEASE_CAPSULE | Freq: Every day | ORAL | 3 refills | Status: DC
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
## 2018-11-17 DIAGNOSIS — J301 Allergic rhinitis due to pollen: Secondary | ICD-10-CM | POA: Diagnosis not present

## 2018-11-17 DIAGNOSIS — J3089 Other allergic rhinitis: Secondary | ICD-10-CM | POA: Diagnosis not present

## 2018-11-22 DIAGNOSIS — J301 Allergic rhinitis due to pollen: Secondary | ICD-10-CM | POA: Diagnosis not present

## 2018-11-22 DIAGNOSIS — J3089 Other allergic rhinitis: Secondary | ICD-10-CM | POA: Diagnosis not present

## 2018-11-28 DIAGNOSIS — J301 Allergic rhinitis due to pollen: Secondary | ICD-10-CM | POA: Diagnosis not present

## 2018-11-28 DIAGNOSIS — J3089 Other allergic rhinitis: Secondary | ICD-10-CM | POA: Diagnosis not present

## 2018-11-29 ENCOUNTER — Other Ambulatory Visit: Payer: Self-pay | Admitting: Internal Medicine

## 2018-11-30 DIAGNOSIS — N210 Calculus in bladder: Secondary | ICD-10-CM | POA: Diagnosis not present

## 2018-12-07 DIAGNOSIS — J3089 Other allergic rhinitis: Secondary | ICD-10-CM | POA: Diagnosis not present

## 2018-12-07 DIAGNOSIS — J301 Allergic rhinitis due to pollen: Secondary | ICD-10-CM | POA: Diagnosis not present

## 2018-12-13 DIAGNOSIS — J3089 Other allergic rhinitis: Secondary | ICD-10-CM | POA: Diagnosis not present

## 2018-12-13 DIAGNOSIS — J301 Allergic rhinitis due to pollen: Secondary | ICD-10-CM | POA: Diagnosis not present

## 2018-12-18 ENCOUNTER — Other Ambulatory Visit: Payer: Self-pay | Admitting: Urology

## 2018-12-19 ENCOUNTER — Other Ambulatory Visit: Payer: Self-pay | Admitting: Internal Medicine

## 2018-12-19 ENCOUNTER — Telehealth: Payer: Self-pay | Admitting: Gastroenterology

## 2018-12-19 ENCOUNTER — Other Ambulatory Visit: Payer: Self-pay

## 2018-12-19 ENCOUNTER — Ambulatory Visit
Admission: RE | Admit: 2018-12-19 | Discharge: 2018-12-19 | Disposition: A | Discharge status: Home / Self Care | Payer: PPO | Source: Ambulatory Visit | Attending: Internal Medicine | Admitting: Internal Medicine

## 2018-12-19 DIAGNOSIS — K862 Cyst of pancreas: Secondary | ICD-10-CM | POA: Diagnosis not present

## 2018-12-19 DIAGNOSIS — J301 Allergic rhinitis due to pollen: Secondary | ICD-10-CM | POA: Diagnosis not present

## 2018-12-19 DIAGNOSIS — K8020 Calculus of gallbladder without cholecystitis without obstruction: Secondary | ICD-10-CM | POA: Diagnosis not present

## 2018-12-19 DIAGNOSIS — K869 Disease of pancreas, unspecified: Secondary | ICD-10-CM

## 2018-12-19 DIAGNOSIS — J3089 Other allergic rhinitis: Secondary | ICD-10-CM | POA: Diagnosis not present

## 2018-12-19 DIAGNOSIS — K449 Diaphragmatic hernia without obstruction or gangrene: Secondary | ICD-10-CM | POA: Diagnosis not present

## 2018-12-19 MED ORDER — GADOBENATE DIMEGLUMINE 529 MG/ML IV SOLN
19.0000 mL | Freq: Once | INTRAVENOUS | Status: AC | PRN
  Administered 2018-12-19: 19 mL via INTRAVENOUS
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
## 2018-12-19 NOTE — Telephone Encounter (Signed)
Dr Ardis Hughs or Dr Rush Landmark can you please review for EUS thank you

## 2018-12-19 NOTE — Telephone Encounter (Signed)
There is a referral to schedule pt for EUS.

## 2018-12-20 NOTE — Telephone Encounter (Signed)
See note from Dr Rush Landmark-- the pt is an [facility] pt

## 2018-12-26 DIAGNOSIS — J301 Allergic rhinitis due to pollen: Secondary | ICD-10-CM | POA: Diagnosis not present

## 2018-12-26 DIAGNOSIS — J3089 Other allergic rhinitis: Secondary | ICD-10-CM | POA: Diagnosis not present

## 2018-12-28 DIAGNOSIS — J3089 Other allergic rhinitis: Secondary | ICD-10-CM | POA: Diagnosis not present

## 2018-12-28 DIAGNOSIS — J301 Allergic rhinitis due to pollen: Secondary | ICD-10-CM | POA: Diagnosis not present

## 2019-01-02 DIAGNOSIS — J4530 Mild persistent asthma, uncomplicated: Secondary | ICD-10-CM | POA: Diagnosis not present

## 2019-01-02 DIAGNOSIS — J301 Allergic rhinitis due to pollen: Secondary | ICD-10-CM | POA: Diagnosis not present

## 2019-01-02 DIAGNOSIS — Z91013 Allergy to seafood: Secondary | ICD-10-CM | POA: Diagnosis not present

## 2019-01-02 DIAGNOSIS — J3089 Other allergic rhinitis: Secondary | ICD-10-CM | POA: Diagnosis not present

## 2019-01-03 ENCOUNTER — Telehealth: Payer: Self-pay | Admitting: Internal Medicine

## 2019-01-03 DIAGNOSIS — R928 Other abnormal and inconclusive findings on diagnostic imaging of breast: Secondary | ICD-10-CM
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
## 2019-01-03 NOTE — Telephone Encounter (Signed)
Referral placed.

## 2019-01-03 NOTE — Telephone Encounter (Signed)
Copied from [place] [id number]. Topic: General - Other >> Jan 03, 2019  4:56 PM Keene Breath wrote: Reason for CRM: [place] called to ask the doctor for the patient's prior mammogram imaging and reports in order for them to do the tests that the doctor is requesting.  The stated that the tech needs some records to compare before they can go forward.  If there are any questions, call back at [phone]

## 2019-01-03 NOTE — Telephone Encounter (Signed)
Ok to change to diagnostic mammo

## 2019-01-03 NOTE — Telephone Encounter (Signed)
See request °

## 2019-01-05 NOTE — Telephone Encounter (Signed)
Left detailed message on machine for [place] that the patient is new to Dr Jerilee Hoh.  She will need to get her records from her past provider.

## 2019-01-08 ENCOUNTER — Telehealth: Payer: Self-pay | Admitting: Gastroenterology

## 2019-01-08 NOTE — Telephone Encounter (Signed)
Pt reported that she is scheduled 01/22/19 to have bladder stones removed.  She would like a call back to discuss appt scheduled 01/23/19.

## 2019-01-08 NOTE — Telephone Encounter (Signed)
The pt wanted to be sure she was not having a procedure at the appt tomorrow with Dr Jerilynn Mages.  I advised her she will only have a consult and she is ok to keep appt after bladder stone removal on 8/24.

## 2019-01-09 DIAGNOSIS — J301 Allergic rhinitis due to pollen: Secondary | ICD-10-CM | POA: Diagnosis not present

## 2019-01-09 DIAGNOSIS — J3089 Other allergic rhinitis: Secondary | ICD-10-CM | POA: Diagnosis not present

## 2019-01-11 DIAGNOSIS — N952 Postmenopausal atrophic vaginitis: Secondary | ICD-10-CM | POA: Diagnosis not present

## 2019-01-11 DIAGNOSIS — J301 Allergic rhinitis due to pollen: Secondary | ICD-10-CM | POA: Diagnosis not present

## 2019-01-11 DIAGNOSIS — Z6832 Body mass index (BMI) 32.0-32.9, adult: Secondary | ICD-10-CM | POA: Diagnosis not present

## 2019-01-11 DIAGNOSIS — Z01419 Encounter for gynecological examination (general) (routine) without abnormal findings: Secondary | ICD-10-CM | POA: Diagnosis not present

## 2019-01-11 DIAGNOSIS — J3089 Other allergic rhinitis: Secondary | ICD-10-CM | POA: Diagnosis not present

## 2019-01-11 DIAGNOSIS — N6310 Unspecified lump in the right breast, unspecified quadrant: Secondary | ICD-10-CM | POA: Diagnosis not present

## 2019-01-17 ENCOUNTER — Encounter: Payer: Self-pay | Admitting: Internal Medicine

## 2019-01-17 ENCOUNTER — Other Ambulatory Visit: Payer: Self-pay

## 2019-01-17 ENCOUNTER — Ambulatory Visit (INDEPENDENT_AMBULATORY_CARE_PROVIDER_SITE_OTHER): Payer: PPO | Admitting: Internal Medicine

## 2019-01-17 VITALS — BP 110/80 | HR 81 | Temp 97.3°F | Ht 65.5 in | Wt 197.9 lb

## 2019-01-17 DIAGNOSIS — G4700 Insomnia, unspecified: Secondary | ICD-10-CM | POA: Diagnosis not present

## 2019-01-17 DIAGNOSIS — M179 Osteoarthritis of knee, unspecified: Secondary | ICD-10-CM

## 2019-01-17 DIAGNOSIS — M1710 Unilateral primary osteoarthritis, unspecified knee: Secondary | ICD-10-CM

## 2019-01-17 DIAGNOSIS — Z23 Encounter for immunization: Secondary | ICD-10-CM

## 2019-01-17 DIAGNOSIS — R42 Dizziness and giddiness: Secondary | ICD-10-CM | POA: Diagnosis not present

## 2019-01-17 DIAGNOSIS — K869 Disease of pancreas, unspecified: Secondary | ICD-10-CM | POA: Diagnosis not present

## 2019-01-17 DIAGNOSIS — Z1159 Encounter for screening for other viral diseases: Secondary | ICD-10-CM

## 2019-01-17 DIAGNOSIS — J301 Allergic rhinitis due to pollen: Secondary | ICD-10-CM | POA: Diagnosis not present

## 2019-01-17 DIAGNOSIS — R7302 Impaired glucose tolerance (oral): Secondary | ICD-10-CM | POA: Insufficient documentation

## 2019-01-17 DIAGNOSIS — H8110 Benign paroxysmal vertigo, unspecified ear: Secondary | ICD-10-CM

## 2019-01-17 DIAGNOSIS — Z0000 Encounter for general adult medical examination without abnormal findings: Secondary | ICD-10-CM | POA: Diagnosis not present

## 2019-01-17 DIAGNOSIS — J3089 Other allergic rhinitis: Secondary | ICD-10-CM | POA: Diagnosis not present

## 2019-01-17 LAB — CBC WITH DIFFERENTIAL/PLATELET
Basophils Absolute: 0 10*3/uL (ref 0.0–0.1)
Basophils Relative: 0.4 % (ref 0.0–3.0)
Eosinophils Absolute: 0.1 10*3/uL (ref 0.0–0.7)
Eosinophils Relative: 1.1 % (ref 0.0–5.0)
HCT: 43.4 % (ref 36.0–46.0)
Hemoglobin: 14.1 g/dL (ref 12.0–15.0)
Lymphocytes Relative: 24 % (ref 12.0–46.0)
Lymphs Abs: 1.7 10*3/uL (ref 0.7–4.0)
MCHC: 32.5 g/dL (ref 30.0–36.0)
MCV: 84.9 fl (ref 78.0–100.0)
Monocytes Absolute: 0.5 10*3/uL (ref 0.1–1.0)
Monocytes Relative: 7 % (ref 3.0–12.0)
Neutro Abs: 4.9 10*3/uL (ref 1.4–7.7)
Neutrophils Relative %: 67.5 % (ref 43.0–77.0)
Platelets: 279 10*3/uL (ref 150.0–400.0)
RBC: 5.11 Mil/uL (ref 3.87–5.11)
RDW: 14.6 % (ref 11.5–15.5)
WBC: 7.2 10*3/uL (ref 4.0–10.5)

## 2019-01-17 LAB — HEMOGLOBIN A1C: Hgb A1c MFr Bld: 6.1 % (ref 4.6–6.5)

## 2019-01-17 LAB — COMPREHENSIVE METABOLIC PANEL
ALT: 8 U/L (ref 0–35)
AST: 17 U/L (ref 0–37)
Albumin: 4.6 g/dL (ref 3.5–5.2)
Alkaline Phosphatase: 92 U/L (ref 39–117)
BUN: 12 mg/dL (ref 6–23)
CO2: 29 mEq/L (ref 19–32)
Calcium: 10 mg/dL (ref 8.4–10.5)
Chloride: 102 mEq/L (ref 96–112)
Creatinine, Ser: 0.88 mg/dL (ref 0.40–1.20)
GFR: 63.21 mL/min (ref >60.00)
Glucose, Bld: 93 mg/dL (ref 70–99)
Potassium: 4.5 mEq/L (ref 3.5–5.1)
Sodium: 140 mEq/L (ref 135–145)
Total Bilirubin: 0.6 mg/dL (ref 0.2–1.2)
Total Protein: 7.4 g/dL (ref 6.0–8.3)

## 2019-01-17 LAB — LIPID PANEL
Cholesterol: 151 mg/dL (ref 0–200)
HDL: 56.9 mg/dL (ref >39.00)
LDL Cholesterol: 76 mg/dL (ref 0–99)
NonHDL: 93.95
Total CHOL/HDL Ratio: 3
Triglycerides: 89 mg/dL (ref 0.0–149.0)
VLDL: 17.8 mg/dL (ref 0.0–40.0)

## 2019-01-17 LAB — TSH: TSH: 1.4 u[IU]/mL (ref 0.35–4.50)

## 2019-01-17 LAB — VITAMIN D 25 HYDROXY (VIT D DEFICIENCY, FRACTURES): VITD: 82.12 ng/mL (ref 30.00–100.00)

## 2019-01-17 LAB — VITAMIN B12: Vitamin B-12: 341 pg/mL (ref 211–911)

## 2019-01-17 NOTE — Patient Instructions (Signed)
-Nice seeing you today!!  -Lab work today; will notify you once results are available.  -Flu vaccine today.  -Schedule follow up in 6 months.   Preventive Care 65 Years and Older, Female Preventive care refers to lifestyle choices and visits with your health care provider that can promote health and wellness. This includes:  A yearly physical exam. This is also called an annual well check.  Regular dental and eye exams.  Immunizations.  Screening for certain conditions.  Healthy lifestyle choices, such as diet and exercise. What can I expect for my preventive care visit? Physical exam Your health care provider will check:  Height and weight. These may be used to calculate body mass index (BMI), which is a measurement that tells if you are at a healthy weight.  Heart rate and blood pressure.  Your skin for abnormal spots. Counseling Your health care provider may ask you questions about:  Alcohol, tobacco, and drug use.  Emotional well-being.  Home and relationship well-being.  Sexual activity.  Eating habits.  History of falls.  Memory and ability to understand (cognition).  Work and work environment.  Pregnancy and menstrual history. What immunizations do I need?  Influenza (flu) vaccine  This is recommended every year. Tetanus, diphtheria, and pertussis (Tdap) vaccine  You may need a Td booster every 10 years. Varicella (chickenpox) vaccine  You may need this vaccine if you have not already been vaccinated. Zoster (shingles) vaccine  You may need this after age 60. Pneumococcal conjugate (PCV13) vaccine  One dose is recommended after age 65. Pneumococcal polysaccharide (PPSV23) vaccine  One dose is recommended after age 65. Measles, mumps, and rubella (MMR) vaccine  You may need at least one dose of MMR if you were born in 1957 or later. You may also need a second dose. Meningococcal conjugate (MenACWY) vaccine  You may need this if you have  certain conditions. Hepatitis A vaccine  You may need this if you have certain conditions or if you travel or work in places where you may be exposed to hepatitis A. Hepatitis B vaccine  You may need this if you have certain conditions or if you travel or work in places where you may be exposed to hepatitis B. Haemophilus influenzae type b (Hib) vaccine  You may need this if you have certain conditions. You may receive vaccines as individual doses or as more than one vaccine together in one shot (combination vaccines). Talk with your health care provider about the risks and benefits of combination vaccines. What tests do I need? Blood tests  Lipid and cholesterol levels. These may be checked every 5 years, or more frequently depending on your overall health.  Hepatitis C test.  Hepatitis B test. Screening  Lung cancer screening. You may have this screening every year starting at age 55 if you have a 30-pack-year history of smoking and currently smoke or have quit within the past 15 years.  Colorectal cancer screening. All adults should have this screening starting at age 50 and continuing until age 75. Your health care provider may recommend screening at age 45 if you are at increased risk. You will have tests every 1-10 years, depending on your results and the type of screening test.  Diabetes screening. This is done by checking your blood sugar (glucose) after you have not eaten for a while (fasting). You may have this done every 1-3 years.  Mammogram. This may be done every 1-2 years. Talk with your health care provider about   how often you should have regular mammograms.  BRCA-related cancer screening. This may be done if you have a family history of breast, ovarian, tubal, or peritoneal cancers. Other tests  Sexually transmitted disease (STD) testing.  Bone density scan. This is done to screen for osteoporosis. You may have this done starting at age 65. Follow these  instructions at home: Eating and drinking  Eat a diet that includes fresh fruits and vegetables, whole grains, lean protein, and low-fat dairy products. Limit your intake of foods with high amounts of sugar, saturated fats, and salt.  Take vitamin and mineral supplements as recommended by your health care provider.  Do not drink alcohol if your health care provider tells you not to drink.  If you drink alcohol: ? Limit how much you have to 0-1 drink a day. ? Be aware of how much alcohol is in your drink. In the [place] one drink equals one 12 oz bottle of beer (355 mL), one 5 oz glass of wine (148 mL), or one 1 oz glass of hard liquor (44 mL). Lifestyle  Take daily care of your teeth and gums.  Stay active. Exercise for at least 30 minutes on 5 or more days each week.  Do not use any products that contain nicotine or tobacco, such as cigarettes, e-cigarettes, and chewing tobacco. If you need help quitting, ask your health care provider.  If you are sexually active, practice safe sex. Use a condom or other form of protection in order to prevent STIs (sexually transmitted infections).  Talk with your health care provider about taking a low-dose aspirin or statin. What's next?  Go to your health care provider once a year for a well check visit.  Ask your health care provider how often you should have your eyes and teeth checked.  Stay up to date on all vaccines. This information is not intended to replace advice given to you by your health care provider. Make sure you discuss any questions you have with your health care provider. Document Released: 06/13/2015 Document Revised: 05/11/2018 Document Reviewed: 05/11/2018 Elsevier Patient Education  2020 [employer]  

## 2019-01-17 NOTE — Addendum Note (Signed)
Addended by: [name] Picker on: 01/17/2019 11:38 AM   Modules accepted: Orders
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
## 2019-01-18 ENCOUNTER — Encounter (HOSPITAL_BASED_OUTPATIENT_CLINIC_OR_DEPARTMENT_OTHER): Payer: Self-pay | Admitting: *Deleted

## 2019-01-18 ENCOUNTER — Other Ambulatory Visit (HOSPITAL_COMMUNITY)
Admission: RE | Admit: 2019-01-18 | Discharge: 2019-01-18 | Disposition: A | Discharge status: Home / Self Care | Payer: PPO | Source: Ambulatory Visit | Attending: Urology | Admitting: Urology

## 2019-01-18 ENCOUNTER — Encounter: Payer: Self-pay | Admitting: Internal Medicine

## 2019-01-18 ENCOUNTER — Other Ambulatory Visit: Payer: Self-pay

## 2019-01-18 DIAGNOSIS — Z01812 Encounter for preprocedural laboratory examination: Secondary | ICD-10-CM | POA: Insufficient documentation

## 2019-01-18 DIAGNOSIS — Z20828 Contact with and (suspected) exposure to other viral communicable diseases: Secondary | ICD-10-CM | POA: Diagnosis not present

## 2019-01-18 DIAGNOSIS — N6314 Unspecified lump in the right breast, lower inner quadrant: Secondary | ICD-10-CM | POA: Diagnosis not present

## 2019-01-18 DIAGNOSIS — N6001 Solitary cyst of right breast: Secondary | ICD-10-CM | POA: Diagnosis not present

## 2019-01-18 LAB — HM MAMMOGRAPHY: HM Mammogram: ABNORMAL — AB (ref 0–4)

## 2019-01-18 LAB — HEPATITIS C ANTIBODY
Hepatitis C Ab: NONREACTIVE
SIGNAL TO CUT-OFF: 0.01 (ref <1.00)

## 2019-01-18 LAB — SARS CORONAVIRUS 2 (TAT 6-24 HRS): SARS Coronavirus 2: NEGATIVE

## 2019-01-18 NOTE — Progress Notes (Signed)
Spoke w/ pt via phone for pre-op interview.  Npo after mn.  Arrive at 0930.  Current lab results dated 01-17-2019 (cbcdiff, cmp) in chart and epic.  Pt had covid test done today.  Will take prilosec, myrbetriq, and do breo inhaler am dos w/ sips of water.  Asked to bring rescue inhaler.

## 2019-01-19 DIAGNOSIS — J301 Allergic rhinitis due to pollen: Secondary | ICD-10-CM | POA: Diagnosis not present

## 2019-01-19 DIAGNOSIS — J3089 Other allergic rhinitis: Secondary | ICD-10-CM | POA: Diagnosis not present

## 2019-01-19 DIAGNOSIS — J3081 Allergic rhinitis due to animal (cat) (dog) hair and dander: Secondary | ICD-10-CM | POA: Diagnosis not present

## 2019-01-22 ENCOUNTER — Ambulatory Visit (HOSPITAL_BASED_OUTPATIENT_CLINIC_OR_DEPARTMENT_OTHER): Payer: PPO | Admitting: Anesthesiology

## 2019-01-22 ENCOUNTER — Other Ambulatory Visit: Payer: Self-pay

## 2019-01-22 ENCOUNTER — Ambulatory Visit (HOSPITAL_BASED_OUTPATIENT_CLINIC_OR_DEPARTMENT_OTHER)
Admission: RE | Admit: 2019-01-22 | Discharge: 2019-01-22 | Disposition: A | Discharge status: Home / Self Care | Payer: PPO | Attending: Urology | Admitting: Urology

## 2019-01-22 ENCOUNTER — Encounter (HOSPITAL_BASED_OUTPATIENT_CLINIC_OR_DEPARTMENT_OTHER)
Admission: RE | Disposition: A | Discharge status: Home / Self Care | Payer: Self-pay | Source: Home / Self Care | Attending: Urology

## 2019-01-22 ENCOUNTER — Encounter (HOSPITAL_BASED_OUTPATIENT_CLINIC_OR_DEPARTMENT_OTHER): Payer: Self-pay | Admitting: Emergency Medicine

## 2019-01-22 DIAGNOSIS — G4733 Obstructive sleep apnea (adult) (pediatric): Secondary | ICD-10-CM | POA: Insufficient documentation

## 2019-01-22 DIAGNOSIS — F329 Major depressive disorder, single episode, unspecified: Secondary | ICD-10-CM | POA: Diagnosis not present

## 2019-01-22 DIAGNOSIS — J45909 Unspecified asthma, uncomplicated: Secondary | ICD-10-CM | POA: Diagnosis not present

## 2019-01-22 DIAGNOSIS — T8389XA Other specified complication of genitourinary prosthetic devices, implants and grafts, initial encounter: Secondary | ICD-10-CM | POA: Diagnosis not present

## 2019-01-22 DIAGNOSIS — Z96651 Presence of right artificial knee joint: Secondary | ICD-10-CM | POA: Diagnosis not present

## 2019-01-22 DIAGNOSIS — N210 Calculus in bladder: Secondary | ICD-10-CM | POA: Diagnosis not present

## 2019-01-22 HISTORY — DX: Other chronic allergic conjunctivitis: H10.45

## 2019-01-22 HISTORY — DX: Benign paroxysmal vertigo, unspecified ear: H81.10

## 2019-01-22 HISTORY — DX: Presence of spectacles and contact lenses: Z97.3

## 2019-01-22 HISTORY — DX: Overactive bladder: N32.81

## 2019-01-22 HISTORY — PX: HOLMIUM LASER APPLICATION: SHX5852

## 2019-01-22 HISTORY — DX: Stress incontinence (female) (male): N39.3

## 2019-01-22 HISTORY — PX: CYSTOSCOPY WITH LITHOLAPAXY: SHX1425

## 2019-01-22 HISTORY — DX: Other seasonal allergic rhinitis: J30.2

## 2019-01-22 HISTORY — DX: Diaphragmatic hernia without obstruction or gangrene: K44.9

## 2019-01-22 HISTORY — DX: Personal history of other diseases of urinary system: Z87.448

## 2019-01-22 HISTORY — DX: Unspecified osteoarthritis, unspecified site: M19.90

## 2019-01-22 HISTORY — DX: Other seasonal allergic rhinitis: J30.89

## 2019-01-22 HISTORY — DX: Iron deficiency anemia, unspecified: D50.9

## 2019-01-22 HISTORY — DX: Mild persistent asthma, uncomplicated: J45.30

## 2019-01-22 SURGERY — CYSTOSCOPY, WITH BLADDER CALCULUS LITHOLAPAXY
Anesthesia: General

## 2019-01-22 MED ORDER — MIDAZOLAM HCL 2 MG/2ML IJ SOLN
INTRAMUSCULAR | Status: AC
  Filled 2019-01-22: qty 2

## 2019-01-22 MED ORDER — OXYCODONE-ACETAMINOPHEN 5-325 MG PO TABS: 1.0000 | ORAL_TABLET | ORAL | 0 refills | Status: DC | PRN

## 2019-01-22 MED ORDER — FENTANYL CITRATE (PF) 100 MCG/2ML IJ SOLN
INTRAMUSCULAR | Status: AC
  Filled 2019-01-22: qty 2

## 2019-01-22 MED ORDER — MEPERIDINE HCL 25 MG/ML IJ SOLN
6.2500 mg | INTRAMUSCULAR | Status: DC | PRN
  Filled 2019-01-22: qty 1

## 2019-01-22 MED ORDER — LIDOCAINE 2% (20 MG/ML) 5 ML SYRINGE
INTRAMUSCULAR | Status: AC
  Filled 2019-01-22: qty 5

## 2019-01-22 MED ORDER — ONDANSETRON HCL 4 MG/2ML IJ SOLN
INTRAMUSCULAR | Status: DC | PRN
  Administered 2019-01-22: 4 mg via INTRAVENOUS

## 2019-01-22 MED ORDER — ACETAMINOPHEN 325 MG PO TABS
325.0000 mg | ORAL_TABLET | ORAL | Status: DC | PRN
  Filled 2019-01-22: qty 2

## 2019-01-22 MED ORDER — LACTATED RINGERS IV SOLN
INTRAVENOUS | Status: DC
  Administered 2019-01-22: 10:00:00 via INTRAVENOUS
  Filled 2019-01-22: qty 1000

## 2019-01-22 MED ORDER — PROPOFOL 10 MG/ML IV BOLUS
INTRAVENOUS | Status: AC
  Filled 2019-01-22: qty 20

## 2019-01-22 MED ORDER — ONDANSETRON HCL 4 MG/2ML IJ SOLN
4.0000 mg | Freq: Once | INTRAMUSCULAR | Status: DC | PRN
  Filled 2019-01-22: qty 2

## 2019-01-22 MED ORDER — DIPHENHYDRAMINE HCL 50 MG/ML IJ SOLN
25.0000 mg | Freq: Once | INTRAMUSCULAR | Status: AC
  Administered 2019-01-22: 25 mg via INTRAVENOUS
  Filled 2019-01-22: qty 0.5

## 2019-01-22 MED ORDER — DIPHENHYDRAMINE HCL 50 MG/ML IJ SOLN
INTRAMUSCULAR | Status: AC
  Filled 2019-01-22: qty 1

## 2019-01-22 MED ORDER — FENTANYL CITRATE (PF) 100 MCG/2ML IJ SOLN
INTRAMUSCULAR | Status: DC | PRN
  Administered 2019-01-22: 25 ug via INTRAVENOUS
  Administered 2019-01-22: 50 ug via INTRAVENOUS
  Administered 2019-01-22: 25 ug via INTRAVENOUS

## 2019-01-22 MED ORDER — OXYCODONE HCL 5 MG PO TABS
5.0000 mg | ORAL_TABLET | Freq: Once | ORAL | Status: DC | PRN
  Filled 2019-01-22: qty 1

## 2019-01-22 MED ORDER — ONDANSETRON HCL 4 MG/2ML IJ SOLN
INTRAMUSCULAR | Status: AC
  Filled 2019-01-22: qty 2

## 2019-01-22 MED ORDER — DEXAMETHASONE SODIUM PHOSPHATE 10 MG/ML IJ SOLN
INTRAMUSCULAR | Status: AC
  Filled 2019-01-22: qty 1

## 2019-01-22 MED ORDER — BUPIVACAINE HCL (PF) 0.25 % IJ SOLN
INTRAMUSCULAR | Status: AC
  Filled 2019-01-22: qty 30

## 2019-01-22 MED ORDER — OXYCODONE HCL 5 MG/5ML PO SOLN
5.0000 mg | Freq: Once | ORAL | Status: DC | PRN
  Filled 2019-01-22: qty 5

## 2019-01-22 MED ORDER — STERILE WATER FOR IRRIGATION IR SOLN
Status: DC | PRN
  Administered 2019-01-22: 3000 mL

## 2019-01-22 MED ORDER — CEFAZOLIN SODIUM-DEXTROSE 2-4 GM/100ML-% IV SOLN
INTRAVENOUS | Status: AC
  Filled 2019-01-22: qty 100

## 2019-01-22 MED ORDER — FENTANYL CITRATE (PF) 100 MCG/2ML IJ SOLN
25.0000 ug | INTRAMUSCULAR | Status: DC | PRN
  Filled 2019-01-22: qty 1

## 2019-01-22 MED ORDER — PROPOFOL 10 MG/ML IV BOLUS
INTRAVENOUS | Status: DC | PRN
  Administered 2019-01-22: 150 mg via INTRAVENOUS

## 2019-01-22 MED ORDER — CEFAZOLIN SODIUM-DEXTROSE 2-4 GM/100ML-% IV SOLN
2.0000 g | INTRAVENOUS | Status: AC
  Administered 2019-01-22: 12:00:00 2 g via INTRAVENOUS
  Filled 2019-01-22: qty 100

## 2019-01-22 MED ORDER — LIDOCAINE 2% (20 MG/ML) 5 ML SYRINGE
INTRAMUSCULAR | Status: DC | PRN
  Administered 2019-01-22: 100 mg via INTRAVENOUS

## 2019-01-22 MED ORDER — GENTAMICIN SULFATE 40 MG/ML IJ SOLN
5.0000 mg/kg | Freq: Once | INTRAVENOUS | Status: AC
  Administered 2019-01-22: 12:00:00 470 mg via INTRAVENOUS
  Filled 2019-01-22 (×2): qty 11.75

## 2019-01-22 MED ORDER — ESTRADIOL 0.1 MG/GM VA CREA
TOPICAL_CREAM | VAGINAL | Status: AC
  Filled 2019-01-22: qty 42.5

## 2019-01-22 MED ORDER — ACETAMINOPHEN 160 MG/5ML PO SOLN
325.0000 mg | ORAL | Status: DC | PRN
  Filled 2019-01-22: qty 20.3

## 2019-01-22 MED ORDER — SODIUM CHLORIDE 0.9 % IR SOLN
Status: DC | PRN
  Administered 2019-01-22: 3000 mL

## 2019-01-22 MED ORDER — DEXAMETHASONE SODIUM PHOSPHATE 10 MG/ML IJ SOLN
INTRAMUSCULAR | Status: DC | PRN
  Administered 2019-01-22: 5 mg via INTRAVENOUS

## 2019-01-22 SURGICAL SUPPLY — 47 items
ADH SKN CLS APL DERMABOND .7 (GAUZE/BANDAGES/DRESSINGS)
BAG URINE DRAINAGE (UROLOGICAL SUPPLIES) ×2 IMPLANT
BLADE HEX COATED 2.75 (ELECTRODE) ×2 IMPLANT
BLADE SURG 15 STRL LF DISP TIS (BLADE) ×1 IMPLANT
BLADE SURG 15 STRL SS (BLADE) ×2
CATH FOLEY 2WAY SLVR  5CC 14FR (CATHETERS)
CATH FOLEY 2WAY SLVR  5CC 20FR (CATHETERS) ×1
CATH FOLEY 2WAY SLVR 5CC 14FR (CATHETERS) ×1 IMPLANT
CATH FOLEY 2WAY SLVR 5CC 20FR (CATHETERS) IMPLANT
CATH INTERMIT  6FR 70CM (CATHETERS) ×1 IMPLANT
COVER BACK TABLE 60X90IN (DRAPES) ×2 IMPLANT
COVER MAYO STAND STRL (DRAPES) ×2 IMPLANT
COVER SURGICAL LIGHT HANDLE (MISCELLANEOUS) ×2 IMPLANT
COVER WAND RF STERILE (DRAPES) ×3 IMPLANT
DECANTER SPIKE VIAL GLASS SM (MISCELLANEOUS) ×2 IMPLANT
DERMABOND ADVANCED (GAUZE/BANDAGES/DRESSINGS)
DERMABOND ADVANCED .7 DNX12 (GAUZE/BANDAGES/DRESSINGS) IMPLANT
DRAIN PENROSE 18X1/4 LTX STRL (WOUND CARE) IMPLANT
DRAPE SHEET LG 3/4 BI-LAMINATE (DRAPES) ×2 IMPLANT
EVACUATOR MICROVAS BLADDER (UROLOGICAL SUPPLIES) IMPLANT
FIBER LASER FLEXIVA 1000 (UROLOGICAL SUPPLIES) IMPLANT
FIBER LASER FLEXIVA 200 (UROLOGICAL SUPPLIES) IMPLANT
FIBER LASER FLEXIVA 365 (UROLOGICAL SUPPLIES) ×1 IMPLANT
FIBER LASER FLEXIVA 550 (UROLOGICAL SUPPLIES) IMPLANT
GLOVE BIO SURGEON STRL SZ8 (GLOVE) ×2 IMPLANT
GLOVE BIOGEL PI IND STRL 8 (GLOVE) ×1 IMPLANT
GLOVE BIOGEL PI INDICATOR 8 (GLOVE) ×1
GOWN STRL REUS W/TWL XL LVL3 (GOWN DISPOSABLE) ×2 IMPLANT
HOLDER FOLEY CATH W/STRAP (MISCELLANEOUS) ×2 IMPLANT
NDL SAFETY ECLIPSE 18X1.5 (NEEDLE) ×1 IMPLANT
NEEDLE HYPO 18GX1.5 SHARP (NEEDLE) ×2
PACK BASIN DAY SURGERY FS (CUSTOM PROCEDURE TRAY) ×2 IMPLANT
PACK CYSTO (CUSTOM PROCEDURE TRAY) ×2 IMPLANT
PACKING VAGINAL (PACKING) ×2 IMPLANT
PENCIL BUTTON HOLSTER BLD 10FT (ELECTRODE) ×2 IMPLANT
PLUG CATH AND CAP STER (CATHETERS) ×2 IMPLANT
RETRACTOR LONE STAR DISPOSABLE (INSTRUMENTS) ×2 IMPLANT
RETRACTOR STAY HOOK 5MM (MISCELLANEOUS) IMPLANT
SET IRRIG Y TYPE TUR BLADDER L (SET/KITS/TRAYS/PACK) ×2 IMPLANT
SHEET LAVH (DRAPES) ×2 IMPLANT
SPONGE LAP 4X18 RFD (DISPOSABLE) ×2 IMPLANT
SUCTION FRAZIER TIP 10 FR DISP (SUCTIONS) ×2 IMPLANT
SUT MON AB 5-0 PS2 18 (SUTURE) IMPLANT
SUT VIC AB 2-0 UR6 27 (SUTURE) IMPLANT
SYR 10ML LL (SYRINGE) ×2 IMPLANT
WATER STERILE IRR 500ML POUR (IV SOLUTION) ×2 IMPLANT
YANKAUER SUCT BULB TIP 10FT TU (MISCELLANEOUS) ×2 IMPLANT

## 2019-01-22 NOTE — Op Note (Addendum)
Preoperative diagnosis: mesh erosion, bladder calculus  Postoperative diagnosis: same  Procedure: 1 cystoscopy 2. cystolithalopaxy for a stone less than 2cm 3. Removal of eroded mesh in the bladder  Attending: Nicolette Bang  Anesthesia: General  Estimated blood loss: Minimal  Drains: 20 french foley catheter  Specimens: 1. Bladder calculus  Antibiotics: ancef  Findings:  Ureteral orifices in normal anatomic location.  1.5cm bladder calculus attached to eroded right lateral wall mesh. 2 small strands of mesh removed.   Indications: Patient is a 73 year old female with a history of eroded bladder mesh and bladder calculus.  After discussing treatment options, they decided proceed with cystolithalopaxy and removal of eroded mesh.  Procedure her in detail: The patient was brought to the operating room and a brief timeout was done to ensure correct patient, correct procedure, correct site.  General anesthesia was administered patient was placed in dorsal lithotomy position.  Their genitalia was then prepped and draped in usual sterile fashion.  A rigid 28 French cystoscope was passed in the urethra and the bladder.  Bladder was inspected and we noted no masses or lesions.  the ureteral orifices were in the normal orthotopic locations. Using the 1000nm laser fiber the bladder calculus was fragmented and the fragments were then irrigated from the bladder. The stone fragments were the sent for analysis. Using the endoscopic scissors all visible mesh in the bladder was removed. Hemostasis was then obtained with a bugbee. The bladder was then drained, a 20 french foley was placed and this concluded the procedure which was well tolerated by patient.  Complications: None  Condition: Stable, extubated, transferred to PACU  Plan: Patient is to be discharge home. She will followup in 5 days for a voiding trial

## 2019-01-22 NOTE — Discharge Instructions (Signed)
Post Anesthesia Home Care Instructions  Activity: Get plenty of rest for the remainder of the day. A responsible adult should stay with you for 24 hours following the procedure.  For the next 24 hours, DO NOT: -Drive a car -Paediatric nurse -Drink alcoholic beverages -Take any medication unless instructed by your physician -Make any legal decisions or sign important papers.  Meals: Start with liquid foods such as gelatin or soup. Progress to regular foods as tolerated. Avoid greasy, spicy, heavy foods. If nausea and/or vomiting occur, drink only clear liquids until the nausea and/or vomiting subsides. Call your physician if vomiting continues.  Special Instructions/Symptoms: Your throat may feel dry or sore from the anesthesia or the breathing tube placed in your throat during surgery. If this causes discomfort, gargle with warm salt water. The discomfort should disappear within 24 hours.  If you had a scopolamine patch placed behind your ear for the management of post- operative nausea and/or vomiting:  1. The medication in the patch is effective for 72 hours, after which it should be removed.  Wrap patch in a tissue and discard in the trash. Wash hands thoroughly with soap and water. 2. You may remove the patch earlier than 72 hours if you experience unpleasant side effects which may include dry mouth, dizziness or visual disturbances. 3. Avoid touching the patch. Wash your hands with soap and water after contact with the patch.   Indwelling Urinary Catheter Care, Adult An indwelling urinary catheter is a thin tube that is put into your bladder. The tube helps to drain pee (urine) out of your body. The tube goes in through your urethra. Your urethra is where pee comes out of your body. Your pee will come out through the catheter, then it will go into a bag (drainage bag). Take good care of your catheter so it will work well. How to wear your catheter and bag Supplies needed  Sticky  tape (adhesive tape) or a leg strap.  Alcohol wipe or soap and water (if you use tape).  A clean towel (if you use tape).  Large overnight bag.  Smaller bag (leg bag). Wearing your catheter Attach your catheter to your leg with tape or a leg strap.  Make sure the catheter is not pulled tight.  If a leg strap gets wet, take it off and put on a dry strap.  If you use tape to hold the bag on your leg: 1. Use an alcohol wipe or soap and water to wash your skin where the tape made it sticky before. 2. Use a clean towel to pat-dry that skin. 3. Use new tape to make the bag stay on your leg. Wearing your bags You should have been given a large overnight bag.  You may wear the overnight bag in the day or night.  Always have the overnight bag lower than your bladder.  Do not let the bag touch the floor.  Before you go to sleep, put a clean plastic bag in a wastebasket. Then hang the overnight bag inside the wastebasket. You should also have a smaller leg bag that fits under your clothes.  Always wear the leg bag below your knee.  Do not wear your leg bag at night. How to care for your skin and catheter Supplies needed  A clean washcloth.  Water and mild soap.  A clean towel. Caring for your skin and catheter      Clean the skin around your catheter every day: ? Wash your  hands with soap and water. ? Wet a clean washcloth in warm water and mild soap. ? Clean the skin around your urethra. ? If you are female: ? Gently spread the folds of skin around your vagina (labia). ? With the washcloth in your other hand, wipe the inner side of your labia on each side. Wipe from front to back. ? If you are female: ? Pull back any skin that covers the end of your penis (foreskin). ? With the washcloth in your other hand, wipe your penis in small circles. Start wiping at the tip of your penis, then move away from the catheter. ? Move the foreskin back in place, if needed. ? With your  free hand, hold the catheter close to where it goes into your body. ? Keep holding the catheter during cleaning so it does not get pulled out. ? With the washcloth in your other hand, clean the catheter. ? Only wipe downward on the catheter. ? Do not wipe upward toward your body. Doing this may push germs into your urethra and cause infection. ? Use a clean towel to pat-dry the catheter and the skin around it. Make sure to wipe off all soap. ? Wash your hands with soap and water.  Shower every day. Do not take baths.  Do not use cream, ointment, or lotion on the area where the catheter goes into your body, unless your doctor tells you to.  Do not use powders, sprays, or lotions on your genital area.  Check your skin around the catheter every day for signs of infection. Check for: ? Redness, swelling, or pain. ? Fluid or blood. ? Warmth. ? Pus or a bad smell. How to empty the bag Supplies needed  Rubbing alcohol.  Gauze pad or cotton ball.  Tape or a leg strap. Emptying the bag Pour the pee out of your bag when it is ?- full, or at least 2-3 times a day. Do this for your overnight bag and your leg bag. 1. Wash your hands with soap and water. 2. Separate (detach) the bag from your leg. 3. Hold the bag over the toilet or a clean pail. Keep the bag lower than your hips and bladder. This is so the pee (urine) does not go back into the tube. 4. Open the pour spout. It is at the bottom of the bag. 5. Empty the pee into the toilet or pail. Do not let the pour spout touch any surface. 6. Put rubbing alcohol on a gauze pad or cotton ball. 7. Use the gauze pad or cotton ball to clean the pour spout. 8. Close the pour spout. 9. Attach the bag to your leg with tape or a leg strap. 10. Wash your hands with soap and water. Follow instructions for cleaning the drainage bag:  From the product maker.  As told by your doctor. How to change the bag Supplies needed  Alcohol wipes.  A  clean bag.  Tape or a leg strap. Changing the bag Replace your bag when it starts to leak, smell bad, or look dirty. 1. Wash your hands with soap and water. 2. Separate the dirty bag from your leg. 3. Pinch the catheter with your fingers so that pee does not spill out. 4. Separate the catheter tube from the bag tube where these tubes connect (at the connection valve). Do not let the tubes touch any surface. 5. Clean the end of the catheter tube with an alcohol wipe. Use a different alcohol  wipe to clean the end of the bag tube. 6. Connect the catheter tube to the tube of the clean bag. 7. Attach the clean bag to your leg with tape or a leg strap. Do not make the bag tight on your leg. 8. Wash your hands with soap and water. General rules   Never pull on your catheter. Never try to take it out. Doing that can hurt you.  Always wash your hands before and after you touch your catheter or bag. Use a mild, fragrance-free soap. If you do not have soap and water, use hand sanitizer.  Always make sure there are no twists or bends (kinks) in the catheter tube.  Always make sure there are no leaks in the catheter or bag.  Drink enough fluid to keep your pee pale yellow.  Do not take baths, swim, or use a hot tub.  If you are female, wipe from front to back after you poop (have a bowel movement). Contact a doctor if:  Your pee is cloudy.  Your pee smells worse than usual.  Your catheter gets clogged.  Your catheter leaks.  Your bladder feels full. Get help right away if:  You have redness, swelling, or pain where the catheter goes into your body.  You have fluid, blood, pus, or a bad smell coming from the area where the catheter goes into your body.  Your skin feels warm where the catheter goes into your body.  You have a fever.  You have pain in your: ? Belly (abdomen). ? Legs. ? Lower back. ? Bladder.  You see blood in the catheter.  Your pee is pink or red.  You  feel sick to your stomach (nauseous).  You throw up (vomit).  You have chills.  Your pee is not draining into the bag.  Your catheter gets pulled out. Summary  An indwelling urinary catheter is a thin tube that is placed into the bladder to help drain pee (urine) out of the body.  The catheter is placed into the part of the body that drains pee from the bladder (urethra).  Taking good care of your catheter will keep it working properly and help prevent problems.  Always wash your hands before and after touching your catheter or bag.  Never pull on your catheter or try to take it out. This information is not intended to replace advice given to you by your health care provider. Make sure you discuss any questions you have with your health care provider. Document Released: 09/11/2012 Document Revised: 09/08/2018 Document Reviewed: 12/31/2016 Elsevier Patient Education  2020 [employer].

## 2019-01-22 NOTE — Anesthesia Preprocedure Evaluation (Signed)
Anesthesia Evaluation  Patient identified by MRN, date of birth, ID band Patient awake    Reviewed: Allergy & Precautions, H&P , NPO status , Patient's Chart, lab work & pertinent test results  History of Anesthesia Complications (+) PONV and history of anesthetic complications  Airway Mallampati: II  TM Distance: >3 FB Neck ROM: Full    Dental  (+) Dental Advisory Given, Teeth Intact   Pulmonary asthma , sleep apnea ,    breath sounds clear to auscultation       Cardiovascular negative cardio ROS   Rhythm:Regular Rate:Normal     Neuro/Psych PSYCHIATRIC DISORDERS Depression negative neurological ROS     GI/Hepatic negative GI ROS, Neg liver ROS, GERD  ,  Endo/Other  negative endocrine ROS  Renal/GU negative Renal ROS     Musculoskeletal  (+) Arthritis , Osteoarthritis,  Fibromyalgia -  Abdominal   Peds  Hematology  (+) Blood dyscrasia, anemia ,   Anesthesia Other Findings   Reproductive/Obstetrics negative OB ROS                            Anesthesia Physical  Anesthesia Plan  ASA: II  Anesthesia Plan: General   Post-op Pain Management:    Induction: Intravenous  PONV Risk Score and Plan: 4 or greater and Ondansetron, Treatment may vary due to age or medical condition, Dexamethasone, Propofol infusion and Diphenhydramine  Airway Management Planned: LMA and Oral ETT  Additional Equipment:   Intra-op Plan:   Post-operative Plan: Extubation in OR  Informed Consent: I have reviewed the patients History and Physical, chart, labs and discussed the procedure including the risks, benefits and alternatives for the proposed anesthesia with the patient or authorized representative who has indicated his/her understanding and acceptance.     Dental advisory given  Plan Discussed with: CRNA, Anesthesiologist and Surgeon  Anesthesia Plan Comments:       Anesthesia Quick  Evaluation

## 2019-01-22 NOTE — H&P (Signed)
Urology Admission H&P  Chief Complaint: bladder calculus/eroded bladder mesh  History of Present Illness: [name] is a 73yo with a hx of eroded bladder mesh and recurrent bladder calculi who devloped further erosion and a new calculus. She has worsenign urinary urgency and frequency. No hematuria or dysuria. No other symptoms  Past Medical History:  Diagnosis Date  . BPPV (benign paroxysmal positional vertigo)   . Chronic allergic conjunctivitis   . Depression   . Diverticulosis   . Fibromyalgia   . GERD (gastroesophageal reflux disease)   . Hiatal hernia   . History of bladder stone   . Iron (Fe) deficiency anemia   . Mild persistent asthma    followed by pcp--- dr r. sharma ([place] allergy/asthma)  . OA (osteoarthritis)    knees, hands  . OAB (overactive bladder)   . OSA (obstructive sleep apnea)    per pt has not used cpap several years  . PONV (postoperative nausea and vomiting)   . RLS (restless legs syndrome)   . Seasonal and perennial allergic rhinitis   . SUI (stress urinary incontinence, female)   . Wears glasses    Past Surgical History:  Procedure Laterality Date  . BLADDER SUSPENSION  1990's   sling  . bladder tacking  2010   with mesh  . BREAST LUMPECTOMY Left 1985   Benign cyst  . BREAST REDUCTION SURGERY Bilateral 2003 approx.  . CORNEA LESION EXCISION Right 2005  approx.  . CYSTOSCOPY N/A 07/12/2016   Procedure: CYSTOSCOPY, VAGINOSCOPY, EXAM UNDER ANESTHESIA, STONE LITHOTRIPSY,;  Surgeon: Cleon Gustin, MD;  Location: [facility];  Service: Urology;  Laterality: N/A;  . CYSTOSCOPY N/A 10/11/2016   Procedure: CYSTOSCOPY;  Surgeon: Cleon Gustin, MD;  Location[facility];  Service: Urology;  Laterality: N/A;  . CYSTOSCOPY WITH LITHOLAPAXY N/A 10/11/2016   Procedure: CYSTOSCOPY WITH LITHOLAPAXY/ EXCISION OF MESH;  Surgeon: Cleon Gustin, MD;  Location: [facility];  Service: Urology;  Laterality:  N/A;  . HOLMIUM LASER APPLICATION  [id number]   Procedure: HOLMIUM LASER APPLICATION;  Surgeon: Cleon Gustin, MD;  Location: [facility];  Service: Urology;;  . RIGHT KNEE ARTHROSCOPY  2013  . TOTAL KNEE ARTHROPLASTY Right 09/12/2012   Procedure: RIGHT TOTAL KNEE ARTHROPLASTY;  Surgeon: Gearlean Alf, MD;  Location: [facility];  Service: Orthopedics;  Laterality: Right;  Marland Kitchen VAGINAL HYSTERECTOMY  1984    Home Medications:  Current Facility-Administered Medications  Medication Dose Route Frequency Provider Last Rate Last Dose  . ceFAZolin (ANCEF) IVPB 2g/100 mL premix  2 g Intravenous 30 min Pre-Op , Candee Furbish, MD      . gentamicin (GARAMYCIN) 470 mg in dextrose 5 % 100 mL IVPB  5 mg/kg Intravenous Once , Candee Furbish, MD      . lactated ringers infusion   Intravenous Continuous Lyn Hollingshead, MD 50 mL/hr at 01/22/19 1010     Allergies:  Allergies  Allergen Reactions  . Shellfish Allergy Hives  . Cephalosporins Hives  . Hydrochlorothiazide Hives  . Penicillins Hives  . Sulfa Antibiotics Hives    Family History  Problem Relation Age of Onset  . Kidney cancer Mother   . Diabetes Mother    Social History:  reports that she has never smoked. She has never used smokeless tobacco. She reports current alcohol use. She reports that she does not use drugs.  Review of Systems  Genitourinary: Positive for frequency and urgency.  All other  systems reviewed and are negative.   Physical Exam:  Vital signs in last 24 hours: Temp:  [98.1 F (36.7 C)] 98.1 F (36.7 C) (08/24 0927) Pulse Rate:  [80] 80 (08/24 0927) Resp:  [16] 16 (08/24 0927) BP: (126)/(75) 126/75 (08/24 0927) SpO2:  [98 %] 98 % (08/24 0927) Weight:  [89.2 kg] 89.2 kg (08/24 0927) Physical Exam  Constitutional: She is oriented to person, place, and time. She appears well-developed and well-nourished.  HENT:  Head: Normocephalic and atraumatic.  Eyes: Pupils are equal, round, and  reactive to light. EOM are normal.  Neck: Normal range of motion. No thyromegaly present.  Cardiovascular: Normal rate and regular rhythm.  Respiratory: Effort normal. No respiratory distress.  GI: Soft. She exhibits no distension.  Musculoskeletal: Normal range of motion.        General: No edema.  Neurological: She is alert and oriented to person, place, and time.  Skin: Skin is warm and dry.  Psychiatric: She has a normal mood and affect. Her behavior is normal. Judgment and thought content normal.    Laboratory Data:  No results found for this or any previous visit (from the past 24 hour(s)). Recent Results (from the past 240 hour(s))  SARS CORONAVIRUS 2 Nasal Swab Aptima Multi Swab     Status: None   Collection Time: 01/18/19 11:52 AM   Specimen: Aptima Multi Swab; Nasal Swab  Result Value Ref Range Status   SARS Coronavirus 2 NEGATIVE NEGATIVE Final    Comment: (NOTE) SARS-CoV-2 target nucleic acids are NOT DETECTED. The SARS-CoV-2 RNA is generally detectable in upper and lower respiratory specimens during the acute phase of infection. Negative results do not preclude SARS-CoV-2 infection, do not rule out co-infections with other pathogens, and should not be used as the sole basis for treatment or other patient management decisions. Negative results must be combined with clinical observations, patient history, and epidemiological information. The expected result is Negative. Fact Sheet for Patients: SugarRoll.be Fact Sheet for Healthcare Providers: https://www.woods-mathews.com/ This test is not yet approved or cleared by the [place] FDA and  has been authorized for detection and/or diagnosis of SARS-CoV-2 by FDA under an Emergency Use Authorization (EUA). This EUA will remain  in effect (meaning this test can be used) for the duration of the COVID-19 declaration under Section 56 4(b)(1) of the Act, 21 U.S.C. section  360bbb-3(b)(1), unless the authorization is terminated or revoked sooner. Performed at [facility], [place] [address], [place], [place] [address]    Creatinine: Recent Labs    01/17/19 1139  CREATININE 0.88   Baseline Creatinine: 0.88  Impression/Assessment:  72yo with eroded bladder mesh and bladder calculus  Plan:  The risks/benefits/alternatives to cystolithalopaxy and endoscopic excision of eroded mesh was explained to the patient and she understands and wishes to proceed with surgery  Nicolette Bang 01/22/2019, 11:14 AM

## 2019-01-22 NOTE — Transfer of Care (Signed)
Immediate Anesthesia Transfer of Care Note  Patient: Cassandra [name]  Procedure(s) Performed: Procedure(s) (LRB): CYSTOSCOPY WITH LITHOLAPAXY (N/A) HOLMIUM LASER APPLICATION (N/A)  Patient Location: PACU  Anesthesia Type: General  Level of Consciousness: awake, oriented, sedated and patient cooperative  Airway & Oxygen Therapy: Patient Spontanous Breathing and Patient connected to face mask oxygen  Post-op Assessment: Report given to PACU RN and Post -op Vital signs reviewed and stable  Post vital signs: Reviewed and stable  Complications: No apparent anesthesia complications  Last Vitals:  Vitals Value Taken Time  BP 125/75 01/22/19 1227  Temp    Pulse 93 01/22/19 1228  Resp 13 01/22/19 1228  SpO2 98 % 01/22/19 1228  Vitals shown include unvalidated device data.  Last Pain:  Vitals:   01/22/19 0927  TempSrc: Oral      Patients Stated Pain Goal: 3 (01/22/19 0927)

## 2019-01-22 NOTE — Anesthesia Postprocedure Evaluation (Signed)
Anesthesia Post Note  Patient: Cassandra [name]  Procedure(s) Performed: CYSTOSCOPY WITH LITHOLAPAXY (N/A ) HOLMIUM LASER APPLICATION (N/A )     Patient location during evaluation: PACU Anesthesia Type: General Level of consciousness: awake and alert Pain management: pain level controlled Vital Signs Assessment: post-procedure vital signs reviewed and stable Respiratory status: spontaneous breathing, nonlabored ventilation, respiratory function stable and patient connected to nasal cannula oxygen Cardiovascular status: blood pressure returned to baseline and stable Postop Assessment: no apparent nausea or vomiting Anesthetic complications: no    Last Vitals:  Vitals:   01/22/19 1318 01/22/19 1319  BP: 110/74   Pulse: 72   Resp: 11   Temp:  36.7 C  SpO2: 99%     Last Pain:  Vitals:   01/22/19 1319  TempSrc: Oral  PainSc:                  ,

## 2019-01-22 NOTE — Anesthesia Procedure Notes (Signed)
Procedure Name: LMA Insertion Date/Time: 01/22/2019 11:43 AM Performed by: Suan Halter, CRNA Pre-anesthesia Checklist: Patient identified, Emergency Drugs available, Suction available and Patient being monitored Patient Re-evaluated:Patient Re-evaluated prior to induction Oxygen Delivery Method: Circle system utilized Preoxygenation: Pre-oxygenation with 100% oxygen Induction Type: IV induction Ventilation: Mask ventilation without difficulty LMA: LMA inserted LMA Size: 4.0 Number of attempts: 1 Airway Equipment and Method: Bite block Placement Confirmation: positive ETCO2 Tube secured with: Tape Dental Injury: Teeth and Oropharynx as per pre-operative assessment

## 2019-01-23 ENCOUNTER — Other Ambulatory Visit (INDEPENDENT_AMBULATORY_CARE_PROVIDER_SITE_OTHER): Payer: PPO

## 2019-01-23 ENCOUNTER — Ambulatory Visit: Payer: PPO | Admitting: Gastroenterology

## 2019-01-23 ENCOUNTER — Encounter (HOSPITAL_BASED_OUTPATIENT_CLINIC_OR_DEPARTMENT_OTHER): Payer: Self-pay | Admitting: Urology

## 2019-01-23 VITALS — BP 112/72 | HR 88 | Temp 97.4°F | Ht 65.5 in | Wt 199.0 lb

## 2019-01-23 DIAGNOSIS — R634 Abnormal weight loss: Secondary | ICD-10-CM

## 2019-01-23 DIAGNOSIS — K219 Gastro-esophageal reflux disease without esophagitis: Secondary | ICD-10-CM

## 2019-01-23 DIAGNOSIS — K862 Cyst of pancreas: Secondary | ICD-10-CM | POA: Diagnosis not present

## 2019-01-23 DIAGNOSIS — R1319 Other dysphagia: Secondary | ICD-10-CM

## 2019-01-23 DIAGNOSIS — R1310 Dysphagia, unspecified: Secondary | ICD-10-CM

## 2019-01-23 LAB — HEPATIC FUNCTION PANEL
ALT: 7 U/L (ref 0–35)
AST: 16 U/L (ref 0–37)
Albumin: 4.7 g/dL (ref 3.5–5.2)
Alkaline Phosphatase: 87 U/L (ref 39–117)
Bilirubin, Direct: 0.1 mg/dL (ref 0.0–0.3)
Total Bilirubin: 0.4 mg/dL (ref 0.2–1.2)
Total Protein: 7.7 g/dL (ref 6.0–8.3)

## 2019-01-23 LAB — AMYLASE: Amylase: 27 U/L (ref 27–131)

## 2019-01-23 LAB — LIPASE: Lipase: 18 U/L (ref 11.0–59.0)

## 2019-01-23 MED ORDER — OMEPRAZOLE 20 MG PO CPDR: 20.0000 mg | DELAYED_RELEASE_CAPSULE | Freq: Two times a day (BID) | ORAL | 3 refills | Status: DC

## 2019-01-23 NOTE — Patient Instructions (Addendum)
If you are age 72 or older, your body mass index should be between 23-30. Your Body mass index is 32.61 kg/m. If this is out of the aforementioned range listed, please consider follow up with your Primary Care Provider.  Your provider has requested that you go to the basement level for lab work before leaving today. Press "B" on the elevator. The lab is located at the first door on the left as you exit the elevator.   You have been scheduled for an endoscopy. Please follow written instructions given to you at your visit today. If you use inhalers (even only as needed), please bring them with you on the day of your procedure.  IPMN- Introductal Pancreatic Mucinous Neoplasm   You will also need a repeat  MRI/MRCP in 6 months. I will contact you when it time to schedule.   Thank you for choosing me and [facility].  Dr. Rush Landmark
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
## 2019-01-24 ENCOUNTER — Encounter: Payer: Self-pay | Admitting: Gastroenterology

## 2019-01-25 DIAGNOSIS — R634 Abnormal weight loss: Secondary | ICD-10-CM | POA: Insufficient documentation

## 2019-01-25 DIAGNOSIS — R1319 Other dysphagia: Secondary | ICD-10-CM | POA: Insufficient documentation

## 2019-01-25 DIAGNOSIS — K862 Cyst of pancreas: Secondary | ICD-10-CM | POA: Insufficient documentation

## 2019-01-25 DIAGNOSIS — K219 Gastro-esophageal reflux disease without esophagitis: Secondary | ICD-10-CM | POA: Insufficient documentation

## 2019-01-25 DIAGNOSIS — R1310 Dysphagia, unspecified: Secondary | ICD-10-CM | POA: Insufficient documentation

## 2019-01-26 DIAGNOSIS — J301 Allergic rhinitis due to pollen: Secondary | ICD-10-CM | POA: Diagnosis not present

## 2019-01-26 DIAGNOSIS — J3089 Other allergic rhinitis: Secondary | ICD-10-CM | POA: Diagnosis not present

## 2019-01-29 DIAGNOSIS — N210 Calculus in bladder: Secondary | ICD-10-CM | POA: Diagnosis not present

## 2019-01-31 ENCOUNTER — Encounter: Payer: Self-pay | Admitting: Internal Medicine

## 2019-01-31 ENCOUNTER — Encounter: Payer: Self-pay | Admitting: Gastroenterology

## 2019-02-01 DIAGNOSIS — J3089 Other allergic rhinitis: Secondary | ICD-10-CM | POA: Diagnosis not present

## 2019-02-01 DIAGNOSIS — J301 Allergic rhinitis due to pollen: Secondary | ICD-10-CM | POA: Diagnosis not present

## 2019-02-06 DIAGNOSIS — J3089 Other allergic rhinitis: Secondary | ICD-10-CM | POA: Diagnosis not present

## 2019-02-06 DIAGNOSIS — J301 Allergic rhinitis due to pollen: Secondary | ICD-10-CM | POA: Diagnosis not present

## 2019-02-07 ENCOUNTER — Telehealth: Payer: Self-pay

## 2019-02-07 NOTE — Telephone Encounter (Signed)
Covid-19 screening questions   Do you now or have you had a fever in the last 14 days? NO   Do you have any respiratory symptoms of shortness of breath or cough now or in the last 14 days? NO  Do you have any family members or close contacts with diagnosed or suspected Covid-19 in the past 14 days? NO  Have you been tested for Covid-19 and found to be positive? NO        

## 2019-02-08 ENCOUNTER — Encounter: Payer: Self-pay | Admitting: Gastroenterology

## 2019-02-08 ENCOUNTER — Other Ambulatory Visit: Payer: Self-pay

## 2019-02-08 ENCOUNTER — Ambulatory Visit (AMBULATORY_SURGERY_CENTER): Payer: PPO | Admitting: Gastroenterology

## 2019-02-08 VITALS — BP 113/63 | HR 81 | Temp 98.5°F | Resp 16 | Ht 65.5 in | Wt 199.0 lb

## 2019-02-08 DIAGNOSIS — R634 Abnormal weight loss: Secondary | ICD-10-CM | POA: Diagnosis not present

## 2019-02-08 DIAGNOSIS — K209 Esophagitis, unspecified: Secondary | ICD-10-CM | POA: Diagnosis not present

## 2019-02-08 DIAGNOSIS — K449 Diaphragmatic hernia without obstruction or gangrene: Secondary | ICD-10-CM

## 2019-02-08 DIAGNOSIS — G4733 Obstructive sleep apnea (adult) (pediatric): Secondary | ICD-10-CM | POA: Diagnosis not present

## 2019-02-08 DIAGNOSIS — K862 Cyst of pancreas: Secondary | ICD-10-CM

## 2019-02-08 DIAGNOSIS — K2950 Unspecified chronic gastritis without bleeding: Secondary | ICD-10-CM | POA: Diagnosis not present

## 2019-02-08 DIAGNOSIS — R1310 Dysphagia, unspecified: Secondary | ICD-10-CM | POA: Diagnosis not present

## 2019-02-08 DIAGNOSIS — K2970 Gastritis, unspecified, without bleeding: Secondary | ICD-10-CM

## 2019-02-08 DIAGNOSIS — G2581 Restless legs syndrome: Secondary | ICD-10-CM | POA: Diagnosis not present

## 2019-02-08 DIAGNOSIS — K219 Gastro-esophageal reflux disease without esophagitis: Secondary | ICD-10-CM | POA: Diagnosis not present

## 2019-02-08 MED ORDER — SODIUM CHLORIDE 0.9 % IV SOLN: 500.0000 mL | Freq: Once | INTRAVENOUS | Status: DC

## 2019-02-08 NOTE — Progress Notes (Signed)
Report to PACU, RN, vss, BBS= Clear.  

## 2019-02-08 NOTE — Progress Notes (Addendum)
Pt's states no medical or surgical changes since previsit or office visit.  Cassandra [name]

## 2019-02-08 NOTE — Op Note (Signed)
[facility] Patient Name: Cassandra [name] Procedure Date: 02/08/2019 10:00 AM MRN: [id number] Endoscopist:  Mansouraty , MD Age: 72 Referring MD:  Date of Birth: 06/21/1945 Gender: Female Account #: [id number] Procedure:                Upper GI endoscopy Indications:              Dysphagia, Heartburn, Weight loss Medicines:                Monitored Anesthesia Care Procedure:                Pre-Anesthesia Assessment:                           - Prior to the procedure, a History and Physical                            was performed, and patient medications and                            allergies were reviewed. The patient's tolerance of                            previous anesthesia was also reviewed. The risks                            and benefits of the procedure and the sedation                            options and risks were discussed with the patient.                            All questions were answered, and informed consent                            was obtained. Prior Anticoagulants: The patient has                            taken no previous anticoagulant or antiplatelet                            agents. ASA Grade Assessment: III - A patient with                            severe systemic disease. After reviewing the risks                            and benefits, the patient was deemed in                            satisfactory condition to undergo the procedure.                           After obtaining informed consent, the endoscope was                              passed under direct vision. Throughout the                            procedure, the patient's blood pressure, pulse, and                            oxygen saturations were monitored continuously. The                            Endoscope was introduced through the mouth, and                            advanced to the second part of duodenum. The upper                            GI endoscopy  was accomplished without difficulty.                            The patient tolerated the procedure. Scope In: Scope Out: Findings:                 White nummular lesions were noted in the entire                            esophagus. Biopsies were taken with a cold forceps                            for histology to rule out Candida but also to rule                            out EoE.                           A 3 cm hiatal hernia was found. The proximal extent                            of the gastric folds (end of tubular esophagus) was                            35 cm from the incisors. The hiatal narrowing was                            38 cm from the incisors. The Z-line was 35 cm from                            the incisors.                           After completing the EGD, a guidewire was placed                            and the scope was withdrawn. Dilation was performed  in the entire esophagus with a Savary dilator with                            no resistance at 16 mm and 17 mm and moderate                            resistance at 18 mm. The dilation site was examined                            following endoscope reinsertion and showed mild                            mucosal disruption at the [place] and at the                            UES and mild improvement in luminal narrowing.                           Striped moderately erythematous mucosa without                            bleeding was found in the gastric antrum.                           No other gross lesions were noted in the entire                            examined stomach. Biopsies were taken with a cold                            forceps for histology and Helicobacter pylori                            testing.                           No gross lesions were noted in the duodenal bulb,                            in the first portion of the duodenum and in the                             second portion of the duodenum. Complications:            No immediate complications. Estimated Blood Loss:     Estimated blood loss was minimal. Impression:               - White nummular lesions in esophageal mucosa.                            Biopsied to rule out Candida. 3 cm hiatal hernia.                           - Dilation performed  in the entire esophagus.                           - Erythematous mucosa in the antrum. No other gross                            lesions in the stomach. Biopsied for HP.                           - No gross lesions in the duodenal bulb, in the                            first portion of the duodenum and in the second                            portion of the duodenum. Recommendation:           - The patient will be observed post-procedure,                            until all discharge criteria are met.                           - Discharge patient to home.                           - Patient has a contact number available for                            emergencies. The signs and symptoms of potential                            delayed complications were discussed with the                            patient. Return to normal activities tomorrow.                            Written discharge instructions were provided to the                            patient.                           - Resume previous diet.                           - Continue present medications.                           - Await pathology results.                           - Repeat upper endoscopy in 2-3 months to check                              healing.                           - Continue 40 mg daily (20 BID) PPI for now.                           - Suspect Candida but will await biopsies and if                            positive then proceed with treatment.                           - Monitor symptoms and based on how patient is                            doing would  consider the role of repeat Dilation                            based on how she does vs Manometry/pH impedence as                            had been previously discussed.                           - The findings and recommendations were discussed                            with the patient.  Mansouraty, MD 02/08/2019 10:29:59 AM 

## 2019-02-08 NOTE — Progress Notes (Signed)
Called to room to assist during endoscopic procedure.  Patient ID and intended procedure confirmed with present staff. Received instructions for my participation in the procedure from the performing physician.  

## 2019-02-08 NOTE — Patient Instructions (Signed)
YOU HAD AN ENDOSCOPIC PROCEDURE TODAY AT THE [facility]:   Refer to the procedure report that was given to you for any specific questions about what was found during the examination.  If the procedure report does not answer your questions, please call your gastroenterologist to clarify.  If you requested that your care partner not be given the details of your procedure findings, then the procedure report has been included in a sealed envelope for you to review at your convenience later.  YOU SHOULD EXPECT: Some feelings of bloating in the abdomen. Passage of more gas than usual.  Walking can help get rid of the air that was put into your GI tract during the procedure and reduce the bloating. If you had a lower endoscopy (such as a colonoscopy or flexible sigmoidoscopy) you may notice spotting of blood in your stool or on the toilet paper. If you underwent a bowel prep for your procedure, you may not have a normal bowel movement for a few days.  Please Note:  You might notice some irritation and congestion in your nose or some drainage.  This is from the oxygen used during your procedure.  There is no need for concern and it should clear up in a day or so.  SYMPTOMS TO REPORT IMMEDIATELY:   Following upper endoscopy (EGD)  Vomiting of blood or coffee ground material  New chest pain or pain under the shoulder blades  Painful or persistently difficult swallowing  New shortness of breath  Fever of 100F or higher  Black, tarry-looking stools  For urgent or emergent issues, a gastroenterologist can be reached at any hour by calling [phone].   DIET:  We do recommend a small meal at first, but then you may proceed to your regular diet.  Drink plenty of fluids but you should avoid alcoholic beverages for 24 hours.  ACTIVITY:  You should plan to take it easy for the rest of today and you should NOT DRIVE or use heavy machinery until tomorrow (because of the sedation medicines used  during the test).    FOLLOW UP: Our staff will call the number listed on your records 48-72 hours following your procedure to check on you and address any questions or concerns that you may have regarding the information given to you following your procedure. If we do not reach you, we will leave a message.  We will attempt to reach you two times.  During this call, we will ask if you have developed any symptoms of COVID 19. If you develop any symptoms (ie: fever, flu-like symptoms, shortness of breath, cough etc.) before then, please call [phone].  If you test positive for Covid 19 in the 2 weeks post procedure, please call and report this information to us.    If any biopsies were taken you will be contacted by phone or by letter within the next 1-3 weeks.  Please call us at [phone] if you have not heard about the biopsies in 3 weeks.    SIGNATURES/CONFIDENTIALITY: You and/or your care partner have signed paperwork which will be entered into your electronic medical record.  These signatures attest to the fact that that the information above on your After Visit Summary has been reviewed and is understood.  Full responsibility of the confidentiality of this discharge information lies with you and/or your care-partner. 

## 2019-02-12 ENCOUNTER — Telehealth: Payer: Self-pay

## 2019-02-12 ENCOUNTER — Telehealth: Payer: Self-pay | Admitting: *Deleted

## 2019-02-12 NOTE — Telephone Encounter (Signed)
First follow up call attempt.  Message left to call if any questions or concerns regarding procedure or covid19.

## 2019-02-12 NOTE — Telephone Encounter (Signed)
  Follow up Call-  Call back number 02/08/2019  Post procedure Call Back phone  # [phone]  Permission to leave phone message Yes  Some recent data might be hidden     Patient questions:  Do you have a fever, pain , or abdominal swelling? No. Pain Score  0 *  Have you tolerated food without any problems? Yes.    Have you been able to return to your normal activities? Yes.    Do you have any questions about your discharge instructions: Diet   No. Medications  No. Follow up visit  No.  Do you have questions or concerns about your Care? No.  Actions: * If pain score is 4 or above: No action needed, pain <4. 1. Have you developed a fever since your procedure? no  2.   Have you had an respiratory symptoms (SOB or cough) since your procedure? no  3.   Have you tested positive for COVID 19 since your procedure no  4.   Have you had any family members/close contacts diagnosed with the COVID 19 since your procedure?  no   If yes to any of these questions please route to Joylene John, RN and Alphonsa Gin, Therapist, sports.

## 2019-02-13 DIAGNOSIS — J3089 Other allergic rhinitis: Secondary | ICD-10-CM | POA: Diagnosis not present

## 2019-02-13 DIAGNOSIS — J301 Allergic rhinitis due to pollen: Secondary | ICD-10-CM | POA: Diagnosis not present

## 2019-02-20 ENCOUNTER — Encounter: Payer: Self-pay | Admitting: Gastroenterology

## 2019-02-20 DIAGNOSIS — J3089 Other allergic rhinitis: Secondary | ICD-10-CM | POA: Diagnosis not present

## 2019-02-20 DIAGNOSIS — J301 Allergic rhinitis due to pollen: Secondary | ICD-10-CM | POA: Diagnosis not present

## 2019-02-25 ENCOUNTER — Other Ambulatory Visit: Payer: Self-pay | Admitting: Internal Medicine

## 2019-02-28 DIAGNOSIS — J3089 Other allergic rhinitis: Secondary | ICD-10-CM | POA: Diagnosis not present

## 2019-02-28 DIAGNOSIS — J301 Allergic rhinitis due to pollen: Secondary | ICD-10-CM | POA: Diagnosis not present

## 2019-03-06 DIAGNOSIS — J301 Allergic rhinitis due to pollen: Secondary | ICD-10-CM | POA: Diagnosis not present

## 2019-03-06 DIAGNOSIS — J3089 Other allergic rhinitis: Secondary | ICD-10-CM | POA: Diagnosis not present

## 2019-03-13 DIAGNOSIS — J301 Allergic rhinitis due to pollen: Secondary | ICD-10-CM | POA: Diagnosis not present

## 2019-03-13 DIAGNOSIS — J3089 Other allergic rhinitis: Secondary | ICD-10-CM | POA: Diagnosis not present

## 2019-03-20 DIAGNOSIS — J301 Allergic rhinitis due to pollen: Secondary | ICD-10-CM | POA: Diagnosis not present

## 2019-03-20 DIAGNOSIS — J3089 Other allergic rhinitis: Secondary | ICD-10-CM | POA: Diagnosis not present

## 2019-03-27 DIAGNOSIS — J3089 Other allergic rhinitis: Secondary | ICD-10-CM | POA: Diagnosis not present

## 2019-03-27 DIAGNOSIS — J301 Allergic rhinitis due to pollen: Secondary | ICD-10-CM | POA: Diagnosis not present

## 2019-04-03 DIAGNOSIS — J3089 Other allergic rhinitis: Secondary | ICD-10-CM | POA: Diagnosis not present

## 2019-04-03 DIAGNOSIS — J301 Allergic rhinitis due to pollen: Secondary | ICD-10-CM | POA: Diagnosis not present

## 2019-04-05 DIAGNOSIS — H2513 Age-related nuclear cataract, bilateral: Secondary | ICD-10-CM | POA: Diagnosis not present

## 2019-04-05 DIAGNOSIS — H524 Presbyopia: Secondary | ICD-10-CM | POA: Diagnosis not present

## 2019-04-05 DIAGNOSIS — H5203 Hypermetropia, bilateral: Secondary | ICD-10-CM | POA: Diagnosis not present

## 2019-04-05 DIAGNOSIS — H43813 Vitreous degeneration, bilateral: Secondary | ICD-10-CM | POA: Diagnosis not present

## 2019-04-05 DIAGNOSIS — H52223 Regular astigmatism, bilateral: Secondary | ICD-10-CM | POA: Diagnosis not present

## 2019-04-10 DIAGNOSIS — J3089 Other allergic rhinitis: Secondary | ICD-10-CM | POA: Diagnosis not present

## 2019-04-10 DIAGNOSIS — J301 Allergic rhinitis due to pollen: Secondary | ICD-10-CM | POA: Diagnosis not present

## 2019-04-13 ENCOUNTER — Encounter: Payer: Self-pay | Admitting: Gastroenterology

## 2019-04-13 ENCOUNTER — Ambulatory Visit: Payer: PPO | Admitting: Gastroenterology

## 2019-04-13 ENCOUNTER — Other Ambulatory Visit: Payer: Self-pay

## 2019-04-13 VITALS — BP 126/80 | HR 73 | Temp 98.0°F | Ht 65.5 in | Wt 198.0 lb

## 2019-04-13 DIAGNOSIS — K2100 Gastro-esophageal reflux disease with esophagitis, without bleeding: Secondary | ICD-10-CM

## 2019-04-13 DIAGNOSIS — R110 Nausea: Secondary | ICD-10-CM

## 2019-04-13 DIAGNOSIS — R1310 Dysphagia, unspecified: Secondary | ICD-10-CM

## 2019-04-13 DIAGNOSIS — K862 Cyst of pancreas: Secondary | ICD-10-CM

## 2019-04-13 DIAGNOSIS — R1319 Other dysphagia: Secondary | ICD-10-CM

## 2019-04-13 NOTE — Progress Notes (Addendum)
[place] VISIT   Primary Care Provider Isaac Bliss, Rayford Halsted, MD [place] [address] [phone]  Patient Profile: Cassandra [name] is a 73 y.o. female with a pmh significant for fibromyalgia, generalized anxiety, GERD, hiatal hernia, bladder stones, asthma, sleep apnea, BPPV, restless leg syndrome, diverticulosis, pancreatic cyst.  The patient presents to the [facility] for an evaluation and management of problem(s) noted below:  Problem List 1. Esophageal dysphagia   2. Gastroesophageal reflux disease with esophagitis without hemorrhage   3. Pancreatic cyst   4. Nausea     History of Present Illness Please see initial consultation note for full details of HPI.    Interval History Since the patient's EGD, she has felt better (for the first few days after she had a sore throat).  She is swallowing 60 to 80% better.  She still has to watch how she eats and chews.  However she is not having had a sensation of dysphagia and quite a while now.  Still has throat clearing.  Patient continues to take twice daily PPI at 20 mg.  She still has worries about her pancreatic lesion and is scheduled for an MRI in January of this year for follow-up of the pancreatic lesion.  Still occasionally has left lower quadrant abdominal discomfort and at times right flank discomfort.  She remains very anxious about her health as she is the last grandparent for all her grandchildren.  She wants to stay as healthy as possible.  However she does exhibit and state that anxiety causes her to have significant emotion and she worries about herself and her family often.  She is eating whatever she wants at this time.  Weight loss is no longer an issue.  Her pyrosis has improved.  She still has nausea that occurs in the morning at times but is not taking anything for it and after a few hours it goes away on its own.  GI Review of Systems Positive  as above Negative for odynophagia, vomiting, early satiety, change in bowel habits, melena, hematochezia  Review of Systems General: Denies fevers/chills Cardiovascular: Denies chest pain Pulmonary: Denies shortness of breath/nocturnal cough Gastroenterological: See HPI Genitourinary: Denies darkened urine Hematological: Denies easy bruising/bleeding Dermatological: Denies jaundice Psychological: Mood remains anxious but she feels more comfortable now that she has undergone the endoscopy and done well   Medications Current Outpatient Medications  Medication Sig Dispense Refill  . acetaminophen (TYLENOL) 500 MG tablet Take 500 mg by mouth every 4 (four) hours as needed.     Marland Kitchen azelastine (OPTIVAR) 0.05 % ophthalmic solution Apply 1 drop to eye 2 (two) times daily as needed.     Marland Kitchen BREO ELLIPTA 200-25 MCG/INH AEPB Inhale 1 puff into the lungs daily.   1  . Cholecalciferol (VITAMIN D-3 PO) Take by mouth daily.    Marland Kitchen EPINEPHrine 0.3 mg/0.3 mL IJ SOAJ injection Inject 0.3 mg into the muscle as needed for anaphylaxis.    Marland Kitchen ibuprofen (ADVIL) 200 MG tablet Take 200 mg by mouth every 6 (six) hours as needed.    Marland Kitchen levocetirizine (XYZAL) 5 MG tablet Take 5 mg by mouth every evening.   1  . Magnesium 200 MG CHEW Chew 2 each by mouth daily.    . Multiple Vitamins-Minerals (CENTRUM SILVER 50+WOMEN PO) Take by mouth daily.    Marland Kitchen omeprazole (PRILOSEC) 20 MG capsule Take 1 capsule (20 mg total) by mouth 2 (two) times daily before a meal.  60 capsule 3  . polycarbophil (FIBERCON) 625 MG tablet Take 625 mg by mouth daily.    Vladimir Faster Glycol-Propyl Glycol (SYSTANE OP) Place 1 drop into both eyes at bedtime as needed (dry eyes).     Marland Kitchen PRESCRIPTION MEDICATION ALLERGY INJECTION'S WEEKLY    . spironolactone (ALDACTONE) 25 MG tablet Take 25 mg by mouth at bedtime. Doesn't take daily    . zolpidem (AMBIEN) 5 MG tablet TAKE 1 TABLET BY MOUTH EVERYDAY AT BEDTIME 90 tablet 0  . MYRBETRIQ 50 MG TB24 tablet Take 50  mg by mouth daily.  11   No current facility-administered medications for this visit.     Allergies Allergies  Allergen Reactions  . Shellfish Allergy Hives  . Cephalosporins Hives  . Hydrochlorothiazide Hives  . Penicillins Hives  . Sulfa Antibiotics Hives    Histories Past Medical History:  Diagnosis Date  . BPPV (benign paroxysmal positional vertigo)   . Chronic allergic conjunctivitis   . Depression   . Diverticulosis   . Fibromyalgia   . GERD (gastroesophageal reflux disease)   . Hiatal hernia   . History of bladder stone   . Iron (Fe) deficiency anemia   . Mild persistent asthma    followed by pcp--- dr r. sharma ([place] allergy/asthma)  . OA (osteoarthritis)    knees, hands  . OAB (overactive bladder)   . OSA (obstructive sleep apnea)    per pt has not used cpap several years  . PONV (postoperative nausea and vomiting)   . RLS (restless legs syndrome)   . Seasonal and perennial allergic rhinitis   . SUI (stress urinary incontinence, female)   . Wears glasses    Past Surgical History:  Procedure Laterality Date  . BLADDER SUSPENSION  1990's   sling  . bladder tacking  2010   with mesh  . BREAST LUMPECTOMY Left 1985   Benign cyst  . BREAST REDUCTION SURGERY Bilateral 2003 approx.  . CORNEA LESION EXCISION Right 2005  approx.  . CYSTOSCOPY N/A 07/12/2016   Procedure: CYSTOSCOPY, VAGINOSCOPY, EXAM UNDER ANESTHESIA, STONE LITHOTRIPSY,;  Surgeon: Cleon Gustin, MD;  Location: [facility];  Service: Urology;  Laterality: N/A;  . CYSTOSCOPY N/A 10/11/2016   Procedure: CYSTOSCOPY;  Surgeon: Cleon Gustin, MD;  Location: [facility];  Service: Urology;  Laterality: N/A;  . CYSTOSCOPY WITH LITHOLAPAXY N/A 10/11/2016   Procedure: CYSTOSCOPY WITH LITHOLAPAXY/ EXCISION OF MESH;  Surgeon: Cleon Gustin, MD;  Location: [facility];  Service: Urology;  Laterality: N/A;  . CYSTOSCOPY WITH LITHOLAPAXY N/A  01/22/2019   Procedure: CYSTOSCOPY WITH LITHOLAPAXY;  Surgeon: Cleon Gustin, MD;  Location: [facility];  Service: Urology;  Laterality: N/A;  1 HR  . HOLMIUM LASER APPLICATION  0/63/0160   Procedure: HOLMIUM LASER APPLICATION;  Surgeon: Cleon Gustin, MD;  Location: [facility];  Service: Urology;;  . HOLMIUM LASER APPLICATION N/A 06/08/3233   Procedure: HOLMIUM LASER APPLICATION;  Surgeon: Cleon Gustin, MD;  Location: [facility];  Service: Urology;  Laterality: N/A;  . RIGHT KNEE ARTHROSCOPY  2013  . TOTAL KNEE ARTHROPLASTY Right 09/12/2012   Procedure: RIGHT TOTAL KNEE ARTHROPLASTY;  Surgeon: Gearlean Alf, MD;  Location: [facility];  Service: Orthopedics;  Laterality: Right;  Marland Kitchen VAGINAL HYSTERECTOMY  1984   Social History   Socioeconomic History  . Marital status: Widowed    Spouse name: Not on file  . Number of children:  Not on file  . Years of education: Not on file  . Highest education level: Not on file  Occupational History  . Occupation: Engineer, civil (consulting)  Social Needs  . Financial resource strain: Not on file  . Food insecurity    Worry: Not on file    Inability: Not on file  . Transportation needs    Medical: Not on file    Non-medical: Not on file  Tobacco Use  . Smoking status: Never Smoker  . Smokeless tobacco: Never Used  Substance and Sexual Activity  . Alcohol use: Yes    Comment: rare  . Drug use: Never  . Sexual activity: Not on file  Lifestyle  . Physical activity    Days per week: Not on file    Minutes per session: Not on file  . Stress: Not on file  Relationships  . Social Herbalist on phone: Not on file    Gets together: Not on file    Attends religious service: Not on file    Active member of club or organization: Not on file    Attends meetings of clubs or organizations: Not on file    Relationship status: Not on file  . Intimate partner violence    Fear of  current or ex partner: Not on file    Emotionally abused: Not on file    Physically abused: Not on file    Forced sexual activity: Not on file  Other Topics Concern  . Not on file  Social History Narrative  . Not on file   Family History  Problem Relation Age of Onset  . Kidney cancer Mother   . Diabetes Mother   . Colon cancer Neg Hx   . Inflammatory bowel disease Neg Hx   . Liver disease Neg Hx   . Pancreatic cancer Neg Hx   . Stomach cancer Neg Hx   . Rectal cancer Neg Hx    I have reviewed her medical, social, and family history in detail and updated the electronic medical record as necessary.    PHYSICAL EXAMINATION  BP 126/80   Pulse 73   Temp 98 F (36.7 C)   Ht 5' 5.5" (1.664 m)   Wt 198 lb (89.8 kg)   BMI 32.45 kg/m  Wt Readings from Last 3 Encounters:  04/13/19 198 lb (89.8 kg)  02/08/19 199 lb (90.3 kg)  01/23/19 199 lb (90.3 kg)  GEN: NAD, appears stated age, doesn't appear chronically ill PSYCH: Cooperative, without pressured speech EYE: Conjunctivae pink, sclerae anicteric ENT: MMM CV: RR without R/Gs  RESP: CTAB posteriorly, without wheezing GI: NABS, soft, protuberant, nontender, without rebound or guarding, no HSM appreciated MSK/EXT: No significant lower extremity edema SKIN: No jaundice NEURO:  Alert & Oriented x 3, no focal deficits   REVIEW OF DATA  I reviewed the following data at the time of this encounter:  GI Procedures and Studies  September 2020 EGD - White nummular lesions in esophageal mucosa. Biopsied to rule out Candida. 3 cm hiatal hernia. - Dilation performed in the entire esophagus. - Erythematous mucosa in the antrum. No other gross lesions in the stomach. Biopsied for HP. - No gross lesions in the duodenal bulb, in the first portion of the duodenum and in the second portion of the duodenum. Pathology Diagnosis 1. Surgical [P], gastric BX - CHRONIC INACTIVE GASTRITIS, MILD. - THERE IS NO EVIDENCE OF HELICOBACTER PYLORI,  DYSPLASIA, OR MALIGNANCY. - SEE COMMENT. 2. Surgical [P],  esophagus - MINIMALLY INFLAMED SQUAMOUS MUCOSA. - THERE IS NO EVIDENCE OF CANDIDA ORGANISMS, SIGNIFICANT INCREASE IN EOSINOPHILS, DYSPLASIA, OR MALIGNANCY. - SEE COMMENT. Microscopic Comment 1. A Warthin-Starry stain is performed to determine the possibility of the presence of Helicobacter pylori. The Warthin-Starry stain is negative for organisms of Helicobacter pylori. 2. A PAS-F stain is negative for the presence of fungal organisms. (JBK:ah 02/12/19)  Laboratory Studies  Reviewed those in epic  Imaging Studies  No new imaging to review   ASSESSMENT  Ms. Sen is a 73 y.o. female with a pmh significant for fibromyalgia, generalized anxiety, GERD, hiatal hernia, bladder stones, asthma, sleep apnea, BPPV, restless leg syndrome, diverticulosis, pancreatic cyst.  The patient is seen today for evaluation and management of:  1. Esophageal dysphagia   2. Gastroesophageal reflux disease with esophagitis without hemorrhage   3. Pancreatic cyst   4. Nausea    Overall, the patient remains hemodynamically and clinically stable.  She is actually improved and feels significantly better than when we first met her and prior to her endoscopy.  As per endoscopy report is not clear that we had a true mucosal disruption after empiric dilation but the patient seemingly is doing somewhat better.  I am not sure that this will be long-lasting.  If her dysphagia significantly return she will need to undergo manometry.  In regards to her symptoms of acid reflux I think her current dosing of 20 twice daily seems effective.  At some point in the future we could consider increasing to 40 twice daily for a period in time to see if it makes any difference in her dysphagia as well but I think she is at a good dosing currently.  Patient also wants to hold on trying to continue to add medications if possible.  She still has occasional left lower quadrant abdominal  discomfort and we discussed the role of a diagnostic colonoscopy is nothing has been seen on cross-sectional imaging and she is considering it but does not want to undergo that currently.  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  If there are significant changes in the pancreatic cyst we will consider the role of endoscopic ultrasound fine-needle aspiration/biopsy.  All patient questions were answered, to the best of my ability, and the patient agrees to the aforementioned plan of action with follow-up as indicated.   PLAN  Still need to obtain UGI colonoscopy report (patient believes it is 5 years before next) MRI/MRCP ordered for January (no FNA/FNB unless significant increase in size, size greater than 2.5 to 3 cm, or other red flag symptoms develop) Continue omeprazole 20 twice daily If over the course of the next few weeks her dysphagia returns then we will likely consider manometry If her dysphagia remains stable to improved for a period of more than 4 to 6 months we will consider one more empiric dilation Minimize stress and anxiety as much as possible Offered antiemetic for nausea however she declined Also wished her a happy early birthday as tomorrow is her birthday   Orders Placed This Encounter  Procedures  . MR ABDOMEN MRCP W WO CONTAST  . Basic Metabolic Panel (BMET)    New Prescriptions   No medications on file   Modified Medications   No medications on file  Planned Follow Up No follow-ups on file.   Justice Britain, MD [facility] Office # [phone]

## 2019-04-13 NOTE — Patient Instructions (Addendum)
You have been scheduled for an MRI at [facility] 1st floor  on 06/13/2019. Your appointment time is 8:00am. Please arrive 1 hour  prior to your appointment time for  Labs and registration purposes. Please make certain not to have anything to eat or drink 6 hours prior to your test. In addition, if you have any metal in your body, have a pacemaker or defibrillator, please be sure to let your ordering physician know. This test typically takes 45 minutes to 1 hour to complete. Should you need to reschedule, please call [phone] to do so.  Continue Omeprazole twice daily.   We will see you back for clinic follow-up in 1-2 weeks after your MRI.   Thank you for choosing me and [facility].  Dr. Rush Landmark

## 2019-04-14 ENCOUNTER — Encounter: Payer: Self-pay | Admitting: Gastroenterology

## 2019-04-17 DIAGNOSIS — J3089 Other allergic rhinitis: Secondary | ICD-10-CM | POA: Diagnosis not present

## 2019-04-17 DIAGNOSIS — J301 Allergic rhinitis due to pollen: Secondary | ICD-10-CM | POA: Diagnosis not present

## 2019-04-24 DIAGNOSIS — J301 Allergic rhinitis due to pollen: Secondary | ICD-10-CM | POA: Diagnosis not present

## 2019-04-24 DIAGNOSIS — J3089 Other allergic rhinitis: Secondary | ICD-10-CM | POA: Diagnosis not present

## 2019-05-02 DIAGNOSIS — J3089 Other allergic rhinitis: Secondary | ICD-10-CM | POA: Diagnosis not present

## 2019-05-02 DIAGNOSIS — J301 Allergic rhinitis due to pollen: Secondary | ICD-10-CM | POA: Diagnosis not present

## 2019-05-08 DIAGNOSIS — J301 Allergic rhinitis due to pollen: Secondary | ICD-10-CM | POA: Diagnosis not present

## 2019-05-08 DIAGNOSIS — J3089 Other allergic rhinitis: Secondary | ICD-10-CM | POA: Diagnosis not present

## 2019-05-15 DIAGNOSIS — J301 Allergic rhinitis due to pollen: Secondary | ICD-10-CM | POA: Diagnosis not present

## 2019-05-15 DIAGNOSIS — J3089 Other allergic rhinitis: Secondary | ICD-10-CM | POA: Diagnosis not present

## 2019-05-22 DIAGNOSIS — J3089 Other allergic rhinitis: Secondary | ICD-10-CM | POA: Diagnosis not present

## 2019-05-22 DIAGNOSIS — J301 Allergic rhinitis due to pollen: Secondary | ICD-10-CM | POA: Diagnosis not present

## 2019-05-23 DIAGNOSIS — J3089 Other allergic rhinitis: Secondary | ICD-10-CM | POA: Diagnosis not present

## 2019-05-23 DIAGNOSIS — J301 Allergic rhinitis due to pollen: Secondary | ICD-10-CM | POA: Diagnosis not present

## 2019-05-27 ENCOUNTER — Other Ambulatory Visit: Payer: Self-pay | Admitting: Internal Medicine

## 2019-05-28 NOTE — Telephone Encounter (Signed)
Last Rx given on 9/29 for #90 with no ref

## 2019-05-29 DIAGNOSIS — J3089 Other allergic rhinitis: Secondary | ICD-10-CM | POA: Diagnosis not present

## 2019-05-29 DIAGNOSIS — J301 Allergic rhinitis due to pollen: Secondary | ICD-10-CM | POA: Diagnosis not present

## 2019-05-31 ENCOUNTER — Encounter: Payer: Self-pay | Admitting: Internal Medicine

## 2019-06-05 DIAGNOSIS — J3089 Other allergic rhinitis: Secondary | ICD-10-CM | POA: Diagnosis not present

## 2019-06-05 DIAGNOSIS — J301 Allergic rhinitis due to pollen: Secondary | ICD-10-CM | POA: Diagnosis not present

## 2019-06-11 ENCOUNTER — Telehealth: Payer: Self-pay | Admitting: Gastroenterology

## 2019-06-11 NOTE — Telephone Encounter (Signed)
The pt questions answered regarding MRI location and labs that were ordered at the November appt with Dr Rush Landmark.  She will have those labs tomorrow.

## 2019-06-12 DIAGNOSIS — J301 Allergic rhinitis due to pollen: Secondary | ICD-10-CM | POA: Diagnosis not present

## 2019-06-12 DIAGNOSIS — J3089 Other allergic rhinitis: Secondary | ICD-10-CM | POA: Diagnosis not present

## 2019-06-13 ENCOUNTER — Ambulatory Visit (HOSPITAL_COMMUNITY)
Admission: RE | Admit: 2019-06-13 | Discharge: 2019-06-13 | Disposition: A | Discharge status: Home / Self Care | Payer: PPO | Source: Ambulatory Visit | Attending: Gastroenterology | Admitting: Gastroenterology

## 2019-06-13 ENCOUNTER — Other Ambulatory Visit: Payer: Self-pay

## 2019-06-13 DIAGNOSIS — R110 Nausea: Secondary | ICD-10-CM

## 2019-06-13 DIAGNOSIS — R935 Abnormal findings on diagnostic imaging of other abdominal regions, including retroperitoneum: Secondary | ICD-10-CM | POA: Diagnosis not present

## 2019-06-13 DIAGNOSIS — R932 Abnormal findings on diagnostic imaging of liver and biliary tract: Secondary | ICD-10-CM | POA: Diagnosis not present

## 2019-06-13 DIAGNOSIS — K862 Cyst of pancreas: Secondary | ICD-10-CM | POA: Diagnosis not present

## 2019-06-13 LAB — CREATININE, SERUM
Creatinine, Ser: 0.8 mg/dL (ref 0.44–1.00)
GFR calc Af Amer: >60 mL/min (ref >60)
GFR calc non Af Amer: >60 mL/min (ref >60)

## 2019-06-13 MED ORDER — GADOBUTROL 1 MMOL/ML IV SOLN
9.0000 mL | Freq: Once | INTRAVENOUS | Status: AC | PRN
  Administered 2019-06-13: 9 mL via INTRAVENOUS

## 2019-06-14 ENCOUNTER — Telehealth: Payer: Self-pay | Admitting: *Deleted

## 2019-06-14 DIAGNOSIS — Z1239 Encounter for other screening for malignant neoplasm of breast: Secondary | ICD-10-CM

## 2019-06-14 NOTE — Telephone Encounter (Signed)
Copied from [place] [id number]. Topic: General - Other >> Jun 14, 2019 10:28 AM Leward Quan A wrote: Reason for CRM: Patient called to ask Dr Jerilee Hoh for a referral for a diagnostic mammogram say that she have an appointment scheduled in February. Please advise Ph#  [phone]

## 2019-06-14 NOTE — Telephone Encounter (Signed)
Referral placed.

## 2019-06-14 NOTE — Addendum Note (Signed)
Addended by: Westley Hummer B on: 06/14/2019 04:40 PM   Modules accepted: Orders

## 2019-06-19 DIAGNOSIS — J301 Allergic rhinitis due to pollen: Secondary | ICD-10-CM | POA: Diagnosis not present

## 2019-06-19 DIAGNOSIS — J3089 Other allergic rhinitis: Secondary | ICD-10-CM | POA: Diagnosis not present

## 2019-06-26 DIAGNOSIS — J301 Allergic rhinitis due to pollen: Secondary | ICD-10-CM | POA: Diagnosis not present

## 2019-06-26 DIAGNOSIS — J3089 Other allergic rhinitis: Secondary | ICD-10-CM | POA: Diagnosis not present

## 2019-06-28 DIAGNOSIS — D0439 Carcinoma in situ of skin of other parts of face: Secondary | ICD-10-CM | POA: Diagnosis not present

## 2019-06-28 DIAGNOSIS — D692 Other nonthrombocytopenic purpura: Secondary | ICD-10-CM | POA: Diagnosis not present

## 2019-06-28 DIAGNOSIS — L814 Other melanin hyperpigmentation: Secondary | ICD-10-CM | POA: Diagnosis not present

## 2019-06-28 DIAGNOSIS — L821 Other seborrheic keratosis: Secondary | ICD-10-CM | POA: Diagnosis not present

## 2019-06-28 DIAGNOSIS — L720 Epidermal cyst: Secondary | ICD-10-CM | POA: Diagnosis not present

## 2019-06-28 DIAGNOSIS — D224 Melanocytic nevi of scalp and neck: Secondary | ICD-10-CM | POA: Diagnosis not present

## 2019-06-28 DIAGNOSIS — L918 Other hypertrophic disorders of the skin: Secondary | ICD-10-CM | POA: Diagnosis not present

## 2019-06-28 DIAGNOSIS — D1801 Hemangioma of skin and subcutaneous tissue: Secondary | ICD-10-CM | POA: Diagnosis not present

## 2019-06-28 DIAGNOSIS — D485 Neoplasm of uncertain behavior of skin: Secondary | ICD-10-CM | POA: Diagnosis not present

## 2019-06-28 DIAGNOSIS — D225 Melanocytic nevi of trunk: Secondary | ICD-10-CM | POA: Diagnosis not present

## 2019-06-30 ENCOUNTER — Ambulatory Visit: Payer: PPO

## 2019-07-03 DIAGNOSIS — J3089 Other allergic rhinitis: Secondary | ICD-10-CM | POA: Diagnosis not present

## 2019-07-03 DIAGNOSIS — J301 Allergic rhinitis due to pollen: Secondary | ICD-10-CM | POA: Diagnosis not present

## 2019-07-04 DIAGNOSIS — D0439 Carcinoma in situ of skin of other parts of face: Secondary | ICD-10-CM | POA: Diagnosis not present

## 2019-07-06 DIAGNOSIS — N210 Calculus in bladder: Secondary | ICD-10-CM | POA: Diagnosis not present

## 2019-07-07 ENCOUNTER — Ambulatory Visit: Payer: PPO | Attending: Internal Medicine

## 2019-07-07 DIAGNOSIS — Z23 Encounter for immunization: Secondary | ICD-10-CM

## 2019-07-07 NOTE — Progress Notes (Signed)
   Covid-19 Vaccination Clinic  Name:  Cassandra [name]    MRN: [id number] DOB: 08-01-1945  07/07/2019  Cassandra [name] was observed post Covid-19 immunization for 30 minutes based on pre-vaccination screening without incidence. She was provided with Vaccine Information Sheet and instruction to access the V-Safe system.   Cassandra [name] was instructed to call 911 with any severe reactions post vaccine: Marland Kitchen Difficulty breathing  . Swelling of your face and throat  . A fast heartbeat  . A bad rash all over your body  . Dizziness and weakness    Immunizations Administered    Name Date Dose VIS Date Route   Pfizer COVID-19 Vaccine 07/07/2019  6:03 PM 0.3 mL 05/11/2019 Intramuscular   Manufacturer: [place]   Lot: [id number]   [place]: [id number]

## 2019-07-10 DIAGNOSIS — J3089 Other allergic rhinitis: Secondary | ICD-10-CM | POA: Diagnosis not present

## 2019-07-10 DIAGNOSIS — J301 Allergic rhinitis due to pollen: Secondary | ICD-10-CM | POA: Diagnosis not present

## 2019-07-11 ENCOUNTER — Ambulatory Visit: Payer: PPO

## 2019-07-12 ENCOUNTER — Ambulatory Visit: Payer: PPO | Admitting: Gastroenterology

## 2019-07-17 DIAGNOSIS — J3089 Other allergic rhinitis: Secondary | ICD-10-CM | POA: Diagnosis not present

## 2019-07-17 DIAGNOSIS — J301 Allergic rhinitis due to pollen: Secondary | ICD-10-CM | POA: Diagnosis not present

## 2019-07-25 DIAGNOSIS — J301 Allergic rhinitis due to pollen: Secondary | ICD-10-CM | POA: Diagnosis not present

## 2019-07-25 DIAGNOSIS — J3089 Other allergic rhinitis: Secondary | ICD-10-CM | POA: Diagnosis not present

## 2019-08-01 ENCOUNTER — Ambulatory Visit: Payer: PPO | Attending: Internal Medicine

## 2019-08-01 DIAGNOSIS — Z23 Encounter for immunization: Secondary | ICD-10-CM | POA: Insufficient documentation

## 2019-08-01 NOTE — Progress Notes (Signed)
   Covid-19 Vaccination Clinic  Name:  Cassandra [name]    MRN: [id number] DOB: 1945/11/12  08/01/2019  [name] was observed post Covid-19 immunization for 30 minutes based on pre-vaccination screening without incident. She was provided with Vaccine Information Sheet and instruction to access the V-Safe system.   [name] was instructed to call 911 with any severe reactions post vaccine: Marland Kitchen Difficulty breathing  . Swelling of face and throat  . A fast heartbeat  . A bad rash all over body  . Dizziness and weakness   Immunizations Administered    Name Date Dose VIS Date Route   Pfizer COVID-19 Vaccine 08/01/2019  3:15 PM 0.3 mL 05/11/2019 Intramuscular   Manufacturer: [place]   Lot: [id number]   [place]: [id number]

## 2019-08-07 DIAGNOSIS — J301 Allergic rhinitis due to pollen: Secondary | ICD-10-CM | POA: Diagnosis not present

## 2019-08-07 DIAGNOSIS — J3089 Other allergic rhinitis: Secondary | ICD-10-CM | POA: Diagnosis not present

## 2019-08-14 DIAGNOSIS — J3089 Other allergic rhinitis: Secondary | ICD-10-CM | POA: Diagnosis not present

## 2019-08-14 DIAGNOSIS — J301 Allergic rhinitis due to pollen: Secondary | ICD-10-CM | POA: Diagnosis not present

## 2019-08-21 DIAGNOSIS — J301 Allergic rhinitis due to pollen: Secondary | ICD-10-CM | POA: Diagnosis not present

## 2019-08-21 DIAGNOSIS — J3089 Other allergic rhinitis: Secondary | ICD-10-CM | POA: Diagnosis not present

## 2019-08-25 ENCOUNTER — Other Ambulatory Visit: Payer: Self-pay | Admitting: Internal Medicine

## 2019-08-27 NOTE — Telephone Encounter (Signed)
Last Rx given on 12/29 for #90 with no ref

## 2019-08-29 ENCOUNTER — Encounter: Payer: Self-pay | Admitting: Internal Medicine

## 2019-08-29 ENCOUNTER — Other Ambulatory Visit: Payer: Self-pay

## 2019-08-29 ENCOUNTER — Ambulatory Visit (INDEPENDENT_AMBULATORY_CARE_PROVIDER_SITE_OTHER): Payer: PPO | Admitting: Internal Medicine

## 2019-08-29 VITALS — BP 120/78 | HR 75 | Temp 98.3°F | Wt 202.2 lb

## 2019-08-29 DIAGNOSIS — R7302 Impaired glucose tolerance (oral): Secondary | ICD-10-CM | POA: Diagnosis not present

## 2019-08-29 DIAGNOSIS — R7309 Other abnormal glucose: Secondary | ICD-10-CM | POA: Diagnosis not present

## 2019-08-29 DIAGNOSIS — K862 Cyst of pancreas: Secondary | ICD-10-CM

## 2019-08-29 DIAGNOSIS — M25562 Pain in left knee: Secondary | ICD-10-CM

## 2019-08-29 DIAGNOSIS — M7702 Medial epicondylitis, left elbow: Secondary | ICD-10-CM | POA: Diagnosis not present

## 2019-08-29 DIAGNOSIS — J3089 Other allergic rhinitis: Secondary | ICD-10-CM | POA: Diagnosis not present

## 2019-08-29 DIAGNOSIS — K2100 Gastro-esophageal reflux disease with esophagitis, without bleeding: Secondary | ICD-10-CM | POA: Diagnosis not present

## 2019-08-29 DIAGNOSIS — J301 Allergic rhinitis due to pollen: Secondary | ICD-10-CM | POA: Diagnosis not present

## 2019-08-29 DIAGNOSIS — G4700 Insomnia, unspecified: Secondary | ICD-10-CM

## 2019-08-29 DIAGNOSIS — G8929 Other chronic pain: Secondary | ICD-10-CM

## 2019-08-29 LAB — POCT GLYCOSYLATED HEMOGLOBIN (HGB A1C): Hemoglobin A1C: 5.4 % (ref 4.0–5.6)
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
## 2019-08-29 NOTE — Patient Instructions (Signed)
-Nice seeing you today!!  -See you back for your physical in August. Please come in fasting.  -Referral to sports medicine today for your knee.  -For your left elbow: icing, tylenol, rest and compressive bracing. Let me know if not better.   Golfer's Elbow  Golfer's elbow, also called medial epicondylitis, is a condition that results from inflammation of the strong bands of tissue (tendons) that attach your forearm muscles to the inside of your bone at the elbow. These tendons affect the muscles that bend the palm toward the wrist (flexion). The tendons become less flexible with age. This condition is called golfer's elbow because it is more common among people who constantly bend and twist their wrists, such as golfers. This injury is usually caused by overuse. What are the causes? This condition is caused by:  Repeatedly flexing, turning, or twisting your wrist.  Constantly gripping objects with your hands. What increases the risk? This condition is more likely to develop in people who play golf, baseball, or tennis. This injury is more common among people who have jobs that require the constant use of their hands, such as:  Carpenters.  Butchers.  Musicians.  Typists. What are the signs or symptoms? This condition causes elbow pain that may spread to your forearm and upper arm. Symptoms of this condition include.  Pain at the inner elbow, forearm, or wrist.  Reduced grip strength. The pain may get worse when you bend your wrist downward. How is this diagnosed? This condition is diagnosed based on your symptoms, medical history, and a physical exam. During the exam, your health care provider may:  Test your grip strength.  Move your wrist to check for pain. You may also have an MRI to:  Confirm the diagnosis.  Look for other issues.  Check for tears in the ligaments, muscles, or tendons. How is this treated? Treatment for this condition includes:  Stopping all  activities that make you bend or twist your elbow or wrist and waiting until your pain and other symptoms go away before resuming those activities.  Wearing an elbow brace or wrist splint to restrict the movements that cause symptoms.  Icing your inner elbow, forearm, or wrist to relieve pain.  Taking NSAIDs or getting corticosteroid injections to reduce pain and swelling.  Doing stretching, range-of-motion, and strengthening exercises (physical therapy) as told by your health care provider. In rare cases, surgery may be needed if your condition does not improve. Follow these instructions at home: If you have a brace or splint:  Wear it as told by your health care provider.  Loosen it if your fingers tingle, become numb, or turn cold and blue.  Keep it clean. Managing pain, stiffness, and swelling   If directed, put ice on the injured area. ? Put ice in a plastic bag. ? Place a towel between your skin and the bag. ? Leave the ice on for 20 minutes, 2-3 times a day.  Move your fingers often to avoid stiffness.  Raise (elevate) the injured area above the level of your heart while you are sitting or lying down. Activity  Rest your injured area as told by your health care provider.  Return to your normal activities as told by your health care provider. Ask your health care provider what activities are safe for you.  Do exercises as told by your health care provider. Lifestyle  If your condition is caused by sports, work with a trainer to make sure that you: ? Have  the correct technique. ? Are using the proper equipment.  If your condition is work related, talk with your employer about changes that can be made. General instructions  Take over-the-counter and prescription medicines only as told by your health care provider.  Do not use any products that contain nicotine or tobacco, such as cigarettes, e-cigarettes, and chewing tobacco. If you need help quitting, ask your  health care provider.  Keep all follow-up visits as told by your health care provider. This is important. How is this prevented?  Before and after activity: ? Warm up and stretch before being active. ? Cool down and stretch after being active. ? Give your body time to rest between periods of activity.  During activity: ? Make sure to use equipment that fits you. ? If you play golf, slow your golf swing to reduce shock in the arm when making contact with the ball.  Maintain physical fitness, including: ? Strength. ? Flexibility. ? Cardiovascular fitness. ? Endurance.  Do exercises to strengthen the forearm muscles. Contact a health care provider if:  Your pain does not improve or it gets worse.  You notice numbness in your hand. Get help right away if:  Your pain is severe.  You cannot move your wrist. Summary  Golfer's elbow, also called medial epicondylitis, is a condition that results from inflammation of the strong bands of tissue (tendons) that attach your forearm muscles to the inside of your bone at the elbow.  This injury usually results from overuse.  Symptoms of this condition include decreased grip strength and pain at the inner elbow, forearm, or wrist.  This injury is treated with rest, ice, medicines, physical therapy, and surgery as needed. This information is not intended to replace advice given to you by your health care provider. Make sure you discuss any questions you have with your health care provider. Document Revised: 09/07/2018 Document Reviewed: 03/23/2018 Elsevier Patient Education  2020 [employer].

## 2019-09-04 DIAGNOSIS — J3089 Other allergic rhinitis: Secondary | ICD-10-CM | POA: Diagnosis not present

## 2019-09-04 DIAGNOSIS — J301 Allergic rhinitis due to pollen: Secondary | ICD-10-CM | POA: Diagnosis not present

## 2019-09-11 ENCOUNTER — Ambulatory Visit (INDEPENDENT_AMBULATORY_CARE_PROVIDER_SITE_OTHER): Payer: PPO | Admitting: Family Medicine

## 2019-09-11 ENCOUNTER — Other Ambulatory Visit: Payer: Self-pay

## 2019-09-11 ENCOUNTER — Encounter: Payer: Self-pay | Admitting: Family Medicine

## 2019-09-11 VITALS — BP 116/72 | HR 73 | Ht 65.5 in | Wt 206.0 lb

## 2019-09-11 DIAGNOSIS — M1712 Unilateral primary osteoarthritis, left knee: Secondary | ICD-10-CM | POA: Diagnosis not present

## 2019-09-11 DIAGNOSIS — J301 Allergic rhinitis due to pollen: Secondary | ICD-10-CM | POA: Diagnosis not present

## 2019-09-11 DIAGNOSIS — M545 Low back pain, unspecified: Secondary | ICD-10-CM | POA: Insufficient documentation

## 2019-09-11 DIAGNOSIS — G8929 Other chronic pain: Secondary | ICD-10-CM | POA: Diagnosis not present

## 2019-09-11 DIAGNOSIS — M25562 Pain in left knee: Secondary | ICD-10-CM | POA: Diagnosis not present

## 2019-09-11 DIAGNOSIS — J3089 Other allergic rhinitis: Secondary | ICD-10-CM | POA: Diagnosis not present

## 2019-09-11 MED ORDER — GABAPENTIN 100 MG PO CAPS: 200.0000 mg | ORAL_CAPSULE | Freq: Every day | ORAL | 0 refills | Status: DC
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
## 2019-09-11 NOTE — Assessment & Plan Note (Signed)
Low back pain.  Seems to be chronic.  Likely some underlying arthritic changes and concern for some spinal stenosis.  Gabapentin given for nighttime relief, also help with some of the radicular symptoms patient may have intermittently.  Patient does have an underlying potential fibromyalgia that could be contributing as well.  Patient will start with formal physical therapy, home exercises, and as we discussed the gabapentin.  Try these different changes and follow-up with me again in 4 weeks.  Worsening symptoms x-rays and potentially advanced imaging will be warranted.

## 2019-09-11 NOTE — Assessment & Plan Note (Signed)
Moderate to severe knee arthritis report appears to be more pseudogout when I do review patient's imaging on my own.  Discussed icing regimen and home exercise, which activities to do which wants to avoid.  Over-the-counter topical anti-inflammatories.  Patient will begin formal physical therapy.  Worsening symptoms consider injections at follow-up patient wants to avoid that at this time due to the abnormal thigh to calf ratio and mild instability of the knee we may need to consider the possibility of custom bracing.

## 2019-09-11 NOTE — Patient Instructions (Addendum)
Good to see you.  Ice 20 minutes 2 times daily. Usually after activity and before bed. Exercises 3 times a week.  PT [facility]  Gabapentin 200 mg at night   Tart cherry extract 1200mg  at night Vitamin D 2000 IU daily  Chondrocalcinosis See me again in 6-8 weeks

## 2019-09-13 DIAGNOSIS — M545 Low back pain: Secondary | ICD-10-CM | POA: Diagnosis not present

## 2019-09-13 DIAGNOSIS — M25562 Pain in left knee: Secondary | ICD-10-CM | POA: Diagnosis not present

## 2019-09-18 DIAGNOSIS — M545 Low back pain: Secondary | ICD-10-CM | POA: Diagnosis not present

## 2019-09-18 DIAGNOSIS — M25562 Pain in left knee: Secondary | ICD-10-CM | POA: Diagnosis not present

## 2019-09-20 DIAGNOSIS — J301 Allergic rhinitis due to pollen: Secondary | ICD-10-CM | POA: Diagnosis not present

## 2019-09-20 DIAGNOSIS — M25562 Pain in left knee: Secondary | ICD-10-CM | POA: Diagnosis not present

## 2019-09-20 DIAGNOSIS — J3089 Other allergic rhinitis: Secondary | ICD-10-CM | POA: Diagnosis not present

## 2019-09-20 DIAGNOSIS — M545 Low back pain: Secondary | ICD-10-CM | POA: Diagnosis not present

## 2019-09-25 DIAGNOSIS — M25562 Pain in left knee: Secondary | ICD-10-CM | POA: Diagnosis not present

## 2019-09-25 DIAGNOSIS — M545 Low back pain: Secondary | ICD-10-CM | POA: Diagnosis not present

## 2019-09-26 DIAGNOSIS — J3089 Other allergic rhinitis: Secondary | ICD-10-CM | POA: Diagnosis not present

## 2019-09-26 DIAGNOSIS — J301 Allergic rhinitis due to pollen: Secondary | ICD-10-CM | POA: Diagnosis not present

## 2019-09-27 DIAGNOSIS — M545 Low back pain: Secondary | ICD-10-CM | POA: Diagnosis not present

## 2019-09-27 DIAGNOSIS — M25562 Pain in left knee: Secondary | ICD-10-CM | POA: Diagnosis not present

## 2019-10-02 DIAGNOSIS — J301 Allergic rhinitis due to pollen: Secondary | ICD-10-CM | POA: Diagnosis not present

## 2019-10-02 DIAGNOSIS — M25562 Pain in left knee: Secondary | ICD-10-CM | POA: Diagnosis not present

## 2019-10-02 DIAGNOSIS — J3089 Other allergic rhinitis: Secondary | ICD-10-CM | POA: Diagnosis not present

## 2019-10-02 DIAGNOSIS — M545 Low back pain: Secondary | ICD-10-CM | POA: Diagnosis not present

## 2019-10-04 DIAGNOSIS — M25562 Pain in left knee: Secondary | ICD-10-CM | POA: Diagnosis not present

## 2019-10-04 DIAGNOSIS — M545 Low back pain: Secondary | ICD-10-CM | POA: Diagnosis not present

## 2019-10-09 DIAGNOSIS — J301 Allergic rhinitis due to pollen: Secondary | ICD-10-CM | POA: Diagnosis not present

## 2019-10-09 DIAGNOSIS — J3089 Other allergic rhinitis: Secondary | ICD-10-CM | POA: Diagnosis not present

## 2019-10-10 ENCOUNTER — Ambulatory Visit: Payer: PPO | Admitting: Family Medicine

## 2019-10-11 DIAGNOSIS — M25562 Pain in left knee: Secondary | ICD-10-CM | POA: Diagnosis not present

## 2019-10-11 DIAGNOSIS — M545 Low back pain: Secondary | ICD-10-CM | POA: Diagnosis not present

## 2019-10-16 DIAGNOSIS — M25562 Pain in left knee: Secondary | ICD-10-CM | POA: Diagnosis not present

## 2019-10-16 DIAGNOSIS — M545 Low back pain: Secondary | ICD-10-CM | POA: Diagnosis not present

## 2019-10-17 DIAGNOSIS — J3089 Other allergic rhinitis: Secondary | ICD-10-CM | POA: Diagnosis not present

## 2019-10-17 DIAGNOSIS — J301 Allergic rhinitis due to pollen: Secondary | ICD-10-CM | POA: Diagnosis not present

## 2019-10-18 DIAGNOSIS — M25562 Pain in left knee: Secondary | ICD-10-CM | POA: Diagnosis not present

## 2019-10-18 DIAGNOSIS — M545 Low back pain: Secondary | ICD-10-CM | POA: Diagnosis not present

## 2019-10-23 DIAGNOSIS — M545 Low back pain: Secondary | ICD-10-CM | POA: Diagnosis not present

## 2019-10-23 DIAGNOSIS — J301 Allergic rhinitis due to pollen: Secondary | ICD-10-CM | POA: Diagnosis not present

## 2019-10-23 DIAGNOSIS — M25562 Pain in left knee: Secondary | ICD-10-CM | POA: Diagnosis not present

## 2019-10-23 DIAGNOSIS — J3089 Other allergic rhinitis: Secondary | ICD-10-CM | POA: Diagnosis not present

## 2019-10-30 ENCOUNTER — Ambulatory Visit (INDEPENDENT_AMBULATORY_CARE_PROVIDER_SITE_OTHER): Payer: PPO

## 2019-10-30 ENCOUNTER — Ambulatory Visit: Payer: PPO | Admitting: Family Medicine

## 2019-10-30 ENCOUNTER — Other Ambulatory Visit: Payer: Self-pay

## 2019-10-30 ENCOUNTER — Encounter: Payer: Self-pay | Admitting: Family Medicine

## 2019-10-30 VITALS — BP 124/82 | HR 73 | Ht 65.5 in | Wt 199.0 lb

## 2019-10-30 DIAGNOSIS — M545 Low back pain, unspecified: Secondary | ICD-10-CM

## 2019-10-30 DIAGNOSIS — E669 Obesity, unspecified: Secondary | ICD-10-CM | POA: Diagnosis not present

## 2019-10-30 DIAGNOSIS — E6609 Other obesity due to excess calories: Secondary | ICD-10-CM | POA: Diagnosis not present

## 2019-10-30 DIAGNOSIS — M1712 Unilateral primary osteoarthritis, left knee: Secondary | ICD-10-CM

## 2019-10-30 DIAGNOSIS — Z6832 Body mass index (BMI) 32.0-32.9, adult: Secondary | ICD-10-CM | POA: Diagnosis not present

## 2019-10-30 DIAGNOSIS — J301 Allergic rhinitis due to pollen: Secondary | ICD-10-CM | POA: Diagnosis not present

## 2019-10-30 DIAGNOSIS — J3089 Other allergic rhinitis: Secondary | ICD-10-CM | POA: Diagnosis not present

## 2019-10-30 DIAGNOSIS — G8929 Other chronic pain: Secondary | ICD-10-CM | POA: Diagnosis not present

## 2019-10-30 DIAGNOSIS — E66811 Obesity, class 1: Secondary | ICD-10-CM

## 2019-10-30 NOTE — Assessment & Plan Note (Signed)
Moderate to severe with what appears to be more of a pseudogout.  We discussed again about over-the-counter medications, continuing to monitor diet.  Does have fibromyalgia which makes it somewhat difficult as well.  Multiple different small allergies also has made it difficult.  Patient will continue to try to be active and if worsening symptoms consider injection

## 2019-10-30 NOTE — Progress Notes (Signed)
[place] Phone: [phone] Subjective:   Cassandra [name], am serving as a scribe for Dr. Hulan Saas. This visit occurred during the SARS-CoV-2 public health emergency.  Safety protocols were in place, including screening questions prior to the visit, additional usage of staff PPE, and extensive cleaning of exam room while observing appropriate contact time as indicated for disinfecting solutions.   I'm seeing this patient by the request  of:  Isaac Bliss, Rayford Halsted, MD  CC: Back pain and knee pain  [id number]   09/11/2019 Moderate to severe knee arthritis report appears to be more pseudogout when I do review patient's imaging on my own.  Discussed icing regimen and home exercise, which activities to do which wants to avoid.  Over-the-counter topical anti-inflammatories.  Patient will begin formal physical therapy.  Worsening symptoms consider injections at follow-up patient wants to avoid that at this time due to the abnormal thigh to calf ratio and mild instability of the knee we may need to consider the possibility of custom bracing.  Low back pain.  Seems to be chronic.  Likely some underlying arthritic changes and concern for some spinal stenosis.  Gabapentin given for nighttime relief, also help with some of the radicular symptoms patient may have intermittently.  Patient does have an underlying potential fibromyalgia that could be contributing as well.  Patient will start with formal physical therapy, home exercises, and as we discussed the gabapentin.  Try these different changes and follow-up with me again in 4 weeks.  Worsening symptoms x-rays and potentially advanced imaging will be warranted.  Update 10/30/2019 Cassandra [name] is a 74 y.o. female coming in with complaint of back and knee pain. Patient states that she went to physical therapy which helped alleviate her pain. Pain in right side of back. Also  notes some stiffness in back after sitting. Pain with sit to stand.   Continued left knee pain. Could hardly walk at a graduation this weekend. Saw bruise on medial aspect of knee. Using tart cherry extract and gabapentin. Continues to have sharp jolts of pain.   Would like to lose more weight.       Past Medical History:  Diagnosis Date  . BPPV (benign paroxysmal positional vertigo)   . Chronic allergic conjunctivitis   . Depression   . Diverticulosis   . Fibromyalgia   . GERD (gastroesophageal reflux disease)   . Hiatal hernia   . History of bladder stone   . Iron (Fe) deficiency anemia   . Mild persistent asthma    followed by pcp--- dr r. sharma ([place] allergy/asthma)  . OA (osteoarthritis)    knees, hands  . OAB (overactive bladder)   . OSA (obstructive sleep apnea)    per pt has not used cpap several years  . PONV (postoperative nausea and vomiting)   . RLS (restless legs syndrome)   . Seasonal and perennial allergic rhinitis   . SUI (stress urinary incontinence, female)   . Wears glasses    Past Surgical History:  Procedure Laterality Date  . BLADDER SUSPENSION  1990's   sling  . bladder tacking  2010   with mesh  . BREAST LUMPECTOMY Left 1985   Benign cyst  . BREAST REDUCTION SURGERY Bilateral 2003 approx.  . CORNEA LESION EXCISION Right 2005  approx.  . CYSTOSCOPY N/A 07/12/2016   Procedure: CYSTOSCOPY, VAGINOSCOPY, EXAM UNDER ANESTHESIA, STONE LITHOTRIPSY,;  Surgeon: Cleon Gustin, MD;  Location: [facility];  Service: Urology;  Laterality: N/A;  . CYSTOSCOPY N/A 10/11/2016   Procedure: CYSTOSCOPY;  Surgeon: Cleon Gustin, MD;  Location: [facility];  Service: Urology;  Laterality: N/A;  . CYSTOSCOPY WITH LITHOLAPAXY N/A 10/11/2016   Procedure: CYSTOSCOPY WITH LITHOLAPAXY/ EXCISION OF MESH;  Surgeon: Cleon Gustin, MD;  Location: [facility];  Service: Urology;  Laterality: N/A;  . CYSTOSCOPY WITH  LITHOLAPAXY N/A 01/22/2019   Procedure: CYSTOSCOPY WITH LITHOLAPAXY;  Surgeon: Cleon Gustin, MD;  Location: [facility];  Service: Urology;  Laterality: N/A;  1 HR  . HOLMIUM LASER APPLICATION  [id number]   Procedure: HOLMIUM LASER APPLICATION;  Surgeon: Cleon Gustin, MD;  Location: [facility];  Service: Urology;;  . HOLMIUM LASER APPLICATION N/A 0000000   Procedure: HOLMIUM LASER APPLICATION;  Surgeon: Cleon Gustin, MD;  Location: [facility];  Service: Urology;  Laterality: N/A;  . RIGHT KNEE ARTHROSCOPY  2013  . TOTAL KNEE ARTHROPLASTY Right 09/12/2012   Procedure: RIGHT TOTAL KNEE ARTHROPLASTY;  Surgeon: Gearlean Alf, MD;  Location: [facility];  Service: Orthopedics;  Laterality: Right;  Marland Kitchen VAGINAL HYSTERECTOMY  1984   Social History   Socioeconomic History  . Marital status: Widowed    Spouse name: Not on file  . Number of children: Not on file  . Years of education: Not on file  . Highest education level: Not on file  Occupational History  . Occupation: Engineer, civil (consulting)  Tobacco Use  . Smoking status: Never Smoker  . Smokeless tobacco: Never Used  Substance and Sexual Activity  . Alcohol use: Yes    Comment: rare  . Drug use: Never  . Sexual activity: Not on file  Other Topics Concern  . Not on file  Social History Narrative  . Not on file   Social Determinants of Health   Financial Resource Strain:   . Difficulty of Paying Living Expenses:   Food Insecurity:   . Worried About Charity fundraiser in the Last Year:   . Arboriculturist in the Last Year:   Transportation Needs:   . Film/video editor (Medical):   Marland Kitchen Lack of Transportation (Non-Medical):   Physical Activity:   . Days of Exercise per Week:   . Minutes of Exercise per Session:   Stress:   . Feeling of Stress :   Social Connections:   . Frequency of Communication with Friends and Family:   . Frequency of Social Gatherings with  Friends and Family:   . Attends Religious Services:   . Active Member of Clubs or Organizations:   . Attends Archivist Meetings:   Marland Kitchen Marital Status:    Allergies  Allergen Reactions  . Shellfish Allergy Hives  . Cephalosporins Hives  . Hydrochlorothiazide Hives  . Penicillins Hives  . Sulfa Antibiotics Hives   Family History  Problem Relation Age of Onset  . Kidney cancer Mother   . Diabetes Mother   . Colon cancer Neg Hx   . Inflammatory bowel disease Neg Hx   . Liver disease Neg Hx   . Pancreatic cancer Neg Hx   . Stomach cancer Neg Hx   . Rectal cancer Neg Hx      Current Outpatient Medications (Cardiovascular):  Marland Kitchen  EPINEPHrine 0.3 mg/0.3 mL IJ SOAJ injection, Inject 0.3 mg into the muscle as needed for anaphylaxis. Marland Kitchen  spironolactone (ALDACTONE) 25 MG tablet, Take  25 mg by mouth at bedtime. Doesn't take daily  Current Outpatient Medications (Respiratory):  Marland Kitchen  BREO ELLIPTA 200-25 MCG/INH AEPB, Inhale 1 puff into the lungs daily.  Marland Kitchen  levocetirizine (XYZAL) 5 MG tablet, Take 5 mg by mouth every evening.   Current Outpatient Medications (Analgesics):  .  acetaminophen (TYLENOL) 500 MG tablet, Take 500 mg by mouth every 4 (four) hours as needed.  Marland Kitchen  ibuprofen (ADVIL) 200 MG tablet, Take 200 mg by mouth every 6 (six) hours as needed.   Current Outpatient Medications (Other):  Marland Kitchen  Cholecalciferol (VITAMIN D-3 PO), Take by mouth daily. Marland Kitchen  gabapentin (NEURONTIN) 100 MG capsule, Take 2 capsules (200 mg total) by mouth at bedtime. .  Magnesium 200 MG CHEW, Chew 2 each by mouth daily. .  Multiple Vitamins-Minerals (CENTRUM SILVER 50+WOMEN PO), Take by mouth daily. Marland Kitchen  omeprazole (PRILOSEC) 20 MG capsule, Take 1 capsule (20 mg total) by mouth 2 (two) times daily before a meal. .  polycarbophil (FIBERCON) 625 MG tablet, Take 625 mg by mouth daily. Marland Kitchen  PRESCRIPTION MEDICATION, ALLERGY INJECTION'S WEEKLY .  zolpidem (AMBIEN) 5 MG tablet, TAKE 1 TABLET BY MOUTH EVERYDAY AT  BEDTIME   Reviewed prior external information including notes and imaging from  primary care provider As well as notes that were available from care everywhere and other healthcare systems.  Past medical history, social, surgical and family history all reviewed in electronic medical record.  [name] pertanent information unless stated regarding to the chief complaint.   Review of Systems:  [name] headache, visual changes, nausea, vomiting, diarrhea, constipation, dizziness, abdominal pain, skin rash, fevers, chills, night sweats, weight loss, swollen lymph nodes, joint swelling, chest pain, shortness of breath, mood changes. POSITIVE muscle aches, body aches  Objective  Blood pressure 124/82, pulse 73, height 5' 5.5" (1.664 m), weight 199 lb (90.3 kg), SpO2 98 %.   General: [name] apparent distress alert and oriented x3 mood and affect normal, dressed appropriately.  Overweight HEENT: Pupils equal, extraocular movements intact  Respiratory: Patient's speak in full sentences and does not appear short of breath  Cardiovascular: [name] lower extremity edema, non tender, [name] erythema  Neuro: Cranial nerves II through XII are intact, neurovascularly intact in all extremities with 2+ DTRs and 2+ pulses.  Gait mild antalgic MSK:  tender with mild limited Back exam still has significant decrease in range of motion.  Forward flexion 45 degrees, extension of only 5 degrees.  Patient does have tightness with FABER test bilaterally.  Knee: Left valgus deformity noted.  Abnormal thigh to calf ratio.  Bruising noted on the medial inferior aspect of the knee Tender to palpation over medial and PF joint line.  ROM full in flexion and extension and lower leg rotation. instability with valgus force.  painful patellar compression. Patellar glide with moderate crepitus. Patellar and quadriceps tendons unremarkable. Hamstring and quadriceps strength is normal.  Impression and Recommendations:     The above  documentation has been reviewed and is accurate and complete Lyndal Pulley, DO       Note: This dictation was prepared with Dragon dictation along with smaller phrase technology. Any transcriptional errors that result from this process are unintentional.

## 2019-10-30 NOTE — Assessment & Plan Note (Signed)
Chronic problem.  Patient was making some strides.  Patient does have mild exacerbation here compared to where she was she states a couple weeks ago.  Continue the gabapentin, and discussed the over-the-counter medicines.  Patient was scheduled and try to do the home exercises on her own before she would start physical therapy.  X-rays ordered today secondary to the significant tightness noted.  Follow-up with me again in 6 weeks.

## 2019-10-30 NOTE — Assessment & Plan Note (Signed)
Patient does have obesity as stated.  Will be referred to healthy weight and wellness encourage her to discuss with her primary care provider.  Patient is under the understanding that if he loses weight will be less force on her back and knee.

## 2019-10-30 NOTE — Patient Instructions (Addendum)
Xray today Try to do exercises Healthy weight wellness will call Continue medications See me in 6 weeks and we can inject knee

## 2019-11-05 DIAGNOSIS — J301 Allergic rhinitis due to pollen: Secondary | ICD-10-CM | POA: Diagnosis not present

## 2019-11-05 DIAGNOSIS — J3089 Other allergic rhinitis: Secondary | ICD-10-CM | POA: Diagnosis not present

## 2019-11-12 DIAGNOSIS — J301 Allergic rhinitis due to pollen: Secondary | ICD-10-CM | POA: Diagnosis not present

## 2019-11-12 DIAGNOSIS — J3089 Other allergic rhinitis: Secondary | ICD-10-CM | POA: Diagnosis not present

## 2019-11-13 ENCOUNTER — Ambulatory Visit (INDEPENDENT_AMBULATORY_CARE_PROVIDER_SITE_OTHER): Payer: PPO | Admitting: Family Medicine

## 2019-11-13 ENCOUNTER — Other Ambulatory Visit: Payer: Self-pay

## 2019-11-13 ENCOUNTER — Encounter (INDEPENDENT_AMBULATORY_CARE_PROVIDER_SITE_OTHER): Payer: Self-pay | Admitting: Family Medicine

## 2019-11-13 VITALS — BP 116/76 | HR 72 | Temp 98.0°F | Ht 65.0 in | Wt 193.0 lb

## 2019-11-13 DIAGNOSIS — F419 Anxiety disorder, unspecified: Secondary | ICD-10-CM | POA: Diagnosis not present

## 2019-11-13 DIAGNOSIS — Z1331 Encounter for screening for depression: Secondary | ICD-10-CM | POA: Diagnosis not present

## 2019-11-13 DIAGNOSIS — D649 Anemia, unspecified: Secondary | ICD-10-CM | POA: Diagnosis not present

## 2019-11-13 DIAGNOSIS — R7303 Prediabetes: Secondary | ICD-10-CM

## 2019-11-13 DIAGNOSIS — R0602 Shortness of breath: Secondary | ICD-10-CM | POA: Diagnosis not present

## 2019-11-13 DIAGNOSIS — R5383 Other fatigue: Secondary | ICD-10-CM

## 2019-11-13 DIAGNOSIS — J301 Allergic rhinitis due to pollen: Secondary | ICD-10-CM | POA: Diagnosis not present

## 2019-11-13 DIAGNOSIS — Z6832 Body mass index (BMI) 32.0-32.9, adult: Secondary | ICD-10-CM | POA: Diagnosis not present

## 2019-11-13 DIAGNOSIS — J3089 Other allergic rhinitis: Secondary | ICD-10-CM | POA: Diagnosis not present

## 2019-11-13 DIAGNOSIS — E669 Obesity, unspecified: Secondary | ICD-10-CM | POA: Diagnosis not present

## 2019-11-13 DIAGNOSIS — E538 Deficiency of other specified B group vitamins: Secondary | ICD-10-CM

## 2019-11-13 DIAGNOSIS — E559 Vitamin D deficiency, unspecified: Secondary | ICD-10-CM | POA: Diagnosis not present

## 2019-11-13 DIAGNOSIS — Z0289 Encounter for other administrative examinations: Secondary | ICD-10-CM
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
## 2019-11-14 LAB — COMPREHENSIVE METABOLIC PANEL
ALT: 9 IU/L (ref 0–32)
AST: 23 IU/L (ref 0–40)
Albumin/Globulin Ratio: 1.9 (ref 1.2–2.2)
Albumin: 4.9 g/dL — ABNORMAL HIGH (ref 3.7–4.7)
Alkaline Phosphatase: 122 IU/L — ABNORMAL HIGH (ref 48–121)
BUN/Creatinine Ratio: 15 (ref 12–28)
BUN: 12 mg/dL (ref 8–27)
Bilirubin Total: 0.4 mg/dL (ref 0.0–1.2)
CO2: 24 mmol/L (ref 20–29)
Calcium: 10.1 mg/dL (ref 8.7–10.3)
Chloride: 100 mmol/L (ref 96–106)
Creatinine, Ser: 0.81 mg/dL (ref 0.57–1.00)
GFR calc Af Amer: 84 mL/min/{1.73_m2} (ref >59)
GFR calc non Af Amer: 73 mL/min/{1.73_m2} (ref >59)
Globulin, Total: 2.6 g/dL (ref 1.5–4.5)
Glucose: 93 mg/dL (ref 65–99)
Potassium: 4.8 mmol/L (ref 3.5–5.2)
Sodium: 139 mmol/L (ref 134–144)
Total Protein: 7.5 g/dL (ref 6.0–8.5)

## 2019-11-14 LAB — T4, FREE: Free T4: 1.04 ng/dL (ref 0.82–1.77)

## 2019-11-14 LAB — ANEMIA PANEL
Ferritin: 30 ng/mL (ref 15–150)
Folate, Hemolysate: 460 ng/mL
Folate, RBC: 970 ng/mL (ref >498)
Hematocrit: 47.4 % — ABNORMAL HIGH (ref 34.0–46.6)
Iron Saturation: 14 % — ABNORMAL LOW (ref 15–55)
Iron: 66 ug/dL (ref 27–139)
Retic Ct Pct: 1.2 % (ref 0.6–2.6)
Total Iron Binding Capacity: 465 ug/dL — ABNORMAL HIGH (ref 250–450)
UIBC: 399 ug/dL — ABNORMAL HIGH (ref 118–369)
Vitamin B-12: 902 pg/mL (ref 232–1245)

## 2019-11-14 LAB — CBC WITH DIFFERENTIAL/PLATELET
Basophils Absolute: 0 10*3/uL (ref 0.0–0.2)
Basos: 0 %
EOS (ABSOLUTE): 0.1 10*3/uL (ref 0.0–0.4)
Eos: 1 %
Hemoglobin: 14.5 g/dL (ref 11.1–15.9)
Immature Grans (Abs): 0 10*3/uL (ref 0.0–0.1)
Immature Granulocytes: 0 %
Lymphocytes Absolute: 2.2 10*3/uL (ref 0.7–3.1)
Lymphs: 29 %
MCH: 26.2 pg — ABNORMAL LOW (ref 26.6–33.0)
MCHC: 30.6 g/dL — ABNORMAL LOW (ref 31.5–35.7)
MCV: 86 fL (ref 79–97)
Monocytes Absolute: 0.5 10*3/uL (ref 0.1–0.9)
Monocytes: 7 %
Neutrophils Absolute: 4.8 10*3/uL (ref 1.4–7.0)
Neutrophils: 63 %
Platelets: 283 10*3/uL (ref 150–450)
RBC: 5.53 x10E6/uL — ABNORMAL HIGH (ref 3.77–5.28)
RDW: 13.7 % (ref 11.7–15.4)
WBC: 7.7 10*3/uL (ref 3.4–10.8)

## 2019-11-14 LAB — LIPID PANEL WITH LDL/HDL RATIO
Cholesterol, Total: 183 mg/dL (ref 100–199)
HDL: 61 mg/dL (ref >39)
LDL Chol Calc (NIH): 104 mg/dL — ABNORMAL HIGH (ref 0–99)
LDL/HDL Ratio: 1.7 ratio (ref 0.0–3.2)
Triglycerides: 99 mg/dL (ref 0–149)
VLDL Cholesterol Cal: 18 mg/dL (ref 5–40)

## 2019-11-14 LAB — INSULIN, RANDOM: INSULIN: 30.1 u[IU]/mL — ABNORMAL HIGH (ref 2.6–24.9)

## 2019-11-14 LAB — TSH: TSH: 1.68 u[IU]/mL (ref 0.450–4.500)

## 2019-11-14 LAB — T3: T3, Total: 131 ng/dL (ref 71–180)

## 2019-11-14 LAB — VITAMIN D 25 HYDROXY (VIT D DEFICIENCY, FRACTURES): Vit D, 25-Hydroxy: 65.2 ng/mL (ref 30.0–100.0)

## 2019-11-14 LAB — HEMOGLOBIN A1C
Est. average glucose Bld gHb Est-mCnc: 117 mg/dL
Hgb A1c MFr Bld: 5.7 % — ABNORMAL HIGH (ref 4.8–5.6)

## 2019-11-14 LAB — FOLATE: Folate: >20 ng/mL (ref >3.0)

## 2019-11-15 NOTE — Progress Notes (Signed)
Chief Complaint:   OBESITY Cassandra [name] (MR# [id number]) is a 74 y.o. female who presents for evaluation and treatment of obesity and related comorbidities. Current BMI is Body mass index is 32.12 kg/m. Cassandra [name] has been struggling with her weight for many years and has been unsuccessful in either losing weight, maintaining weight loss, or reaching her healthy weight goal.  Cassandra [name]'s heaviest weight is 210 lbs in the past. She has been in many weight loss programs in the past, and she notes Cassandra [name] worked best for her in the past. She lives alone since her husband has passed, and she notes it is challenging for her. She would like to lose weight to improve mobility and decrease pain in her back and joints. She skips meals often and finds herself just being "not hungry" most days.  Cassandra [name] is currently in the action stage of change and ready to dedicate time achieving and maintaining a healthier weight. Cassandra [name] is interested in becoming our patient and working on intensive lifestyle modifications including (but not limited to) diet and exercise for weight loss.  Cassandra [name]'s habits were reviewed today and are as follows: Her family eats meals together, her desired weight loss is 33 lbs, she has been heavy most of her life, she started gaining weight after pregnancy, her heaviest weight ever was 210 pounds, she has significant food cravings issues, she skips meals frequently, she frequently makes poor food choices and she struggles with emotional eating.  Depression Screen Cassandra [name]'s Food and Mood (modified PHQ-9) score was 8.  Depression screen PHQ 2/9 11/13/2019  Decreased Interest 1  Down, Depressed, Hopeless 1  PHQ - 2 Score 2  Altered sleeping 1  Tired, decreased energy 2  Change in appetite 2  Feeling bad or failure about yourself  0  Trouble concentrating 1  Moving slowly or fidgety/restless 0  Suicidal thoughts 0  PHQ-9 Score 8  Difficult doing work/chores Somewhat difficult   Subjective:    1. Other fatigue Cassandra [name] admits to daytime somnolence and denies waking up still tired. Patent has a history of symptoms of daytime fatigue. Kenzlie generally gets 6 hours of sleep per night, and states that she has generally restful sleep. Snoring is not present. Apneic episodes are not present. Epworth Sleepiness Score is 2.  2. Short of breath on exertion Cassandra [name] notes increasing shortness of breath with exercising and seems to be worsening over time with weight gain. She notes getting out of breath sooner with activity than she used to. This has not gotten worse recently. Tyreisha denies shortness of breath at rest or orthopnea.  3. Vitamin D deficiency Cassandra [name] has been on 5,000 IU OTC Vit D daily.  4. Vitamin B12 deficiency Cassandra [name] is not on B12 replacement currently.  5. Anemia, unspecified type Cassandra [name] is not on any supplementation currently, but she has fatigue and shortness of breath symptoms.  6. Pre-diabetes Cassandra [name] has a history of elevated A1c at 6.1 in the remote past. She skips morning medications and after she has hunger in the evening.  7. Anxiety with emotional eating Cassandra [name] notes stress eating and boredom eating. She lives alone and finds eating and meal planning challenging, and has been difficult ever since she loss her husband.  Assessment/Plan:   1. Other fatigue Gertrue does feel that her weight is causing her energy to be lower than it should be. Fatigue may be related to obesity, depression or many other causes. Labs will be ordered, and in the meanwhile, Cassandra [name]  will focus on self care including making healthy food choices, increasing physical activity and focusing on stress reduction.  - EKG 12-Lead - Anemia panel - Comprehensive metabolic panel - CBC with Differential/Platelet - Hemoglobin A1c - Insulin, random - VITAMIN D 25 Hydroxy (Vit-D Deficiency, Fractures) - Vitamin B12 - Folate - T3 - T4, free - TSH  2. Short of breath on exertion Cassandra [name] does feel that she  gets out of breath more easily that she used to when she exercises. Denyce's shortness of breath appears to be obesity related and exercise induced. She has agreed to work on weight loss and gradually increase exercise to treat her exercise induced shortness of breath. Will continue to monitor closely.  - Anemia panel - Comprehensive metabolic panel - CBC with Differential/Platelet - Hemoglobin A1c - Insulin, random - Vitamin B12 - Folate - T3 - T4, free - TSH  3. Vitamin D deficiency Low Vitamin D level contributes to fatigue and are associated with obesity, breast, and colon cancer. We will check labs today, and Cassandra [name] will follow-up for routine testing of Vitamin D, at least 2-3 times per year to avoid over-replacement.  - VITAMIN D 25 Hydroxy (Vit-D Deficiency, Fractures)  4. Vitamin B12 deficiency The diagnosis was reviewed with the patient. We will continue to monitor. We will check labs today and Cassandra [name] will continue with a B12 rich diet. Orders and follow up as documented in patient record.  - Anemia panel - Comprehensive metabolic panel - CBC with Differential/Platelet - Hemoglobin A1c - Insulin, random - Vitamin B12 - Folate  5. Anemia, unspecified type We will check labs today, and treatment will be based on results. Cassandra [name] will follow up in 2 weeks. Orders and follow up as documented in patient record.  - Anemia panel - Comprehensive metabolic panel - CBC with Differential/Platelet - Hemoglobin A1c - Insulin, random - Vitamin B12 - Folate  6. Pre-diabetes Cassandra [name] will continue to work on weight loss, exercise, and decreasing simple carbohydrates to help decrease the risk of diabetes. We will check labs today, and after results treatment plan will be discussed.  - Anemia panel - Comprehensive metabolic panel - CBC with Differential/Platelet - Hemoglobin A1c - Insulin, random - Vitamin B12 - Folate - T3 - T4, free - TSH  7. Anxiety with emotional  eating Behavior modification techniques were discussed today to help Cassandra [name] deal with her anxiety. We will refer to Dr. Mallie Mussel, our Bariatric Psychologist for evaluation. Cassandra [name] will continue her medications per her primary care physician. Orders and follow up as documented in patient record.   8. Depression screening Makelle had a positive depression screening. Depression is commonly associated with obesity and often results in emotional eating behaviors. We will monitor this closely and work on CBT to help improve the non-hunger eating patterns. Referral to Psychology may be required if no improvement is seen as she continues in our clinic.  9. Class 1 obesity with serious comorbidity and body mass index (BMI) of 32.0 to 32.9 in adult, unspecified obesity type Cassandra [name] is currently in the action stage of change and her goal is to continue with weight loss efforts. I recommend Cassandra [name] begin the structured treatment plan as follows:  She has agreed to the Category 2 Plan.  Exercise goals: As is.   Behavioral modification strategies: no skipping meals and planning for success.  She was informed of the importance of frequent follow-up visits to maximize her success with intensive lifestyle modifications for her multiple health conditions. She  was informed we would discuss her lab results at her next visit unless there is a critical issue that needs to be addressed sooner. Cassandra [name] agreed to keep her next visit at the agreed upon time to discuss these results.  Objective:   Blood pressure 116/76, [name] 72, temperature 98 F (36.7 C), temperature source Oral, height 5\' 5"  (1.651 m), weight 193 lb (87.5 kg), SpO2 97 %. Body mass index is 32.12 kg/m.  EKG: Normal sinus rhythm, rate 71 BPM.  Indirect Calorimeter completed today shows a VO2 of 129 and a REE of 900.  Her calculated basal metabolic rate is 3903 thus her basal metabolic rate is worse than expected.  General: Cooperative, alert, well developed,  in no acute distress. HEENT: Conjunctivae and lids unremarkable. Cardiovascular: Regular rhythm.  Lungs: Normal work of breathing. Neurologic: No focal deficits.   Lab Results  Component Value Date   CREATININE 0.81 11/13/2019   BUN 12 11/13/2019   NA 139 11/13/2019   K 4.8 11/13/2019   CL 100 11/13/2019   CO2 24 11/13/2019   Lab Results  Component Value Date   ALT 9 11/13/2019   AST 23 11/13/2019   ALKPHOS 122 (H) 11/13/2019   BILITOT 0.4 11/13/2019   Lab Results  Component Value Date   HGBA1C 5.7 (H) 11/13/2019   HGBA1C 5.4 08/29/2019   HGBA1C 6.1 01/17/2019   HGBA1C 4.9 01/06/2007   Lab Results  Component Value Date   INSULIN 30.1 (H) 11/13/2019   Lab Results  Component Value Date   TSH 1.680 11/13/2019   Lab Results  Component Value Date   CHOL 183 11/13/2019   HDL 61 11/13/2019   LDLCALC 104 (H) 11/13/2019   TRIG 99 11/13/2019   CHOLHDL 3 01/17/2019   Lab Results  Component Value Date   WBC 7.7 11/13/2019   HGB 14.5 11/13/2019   HCT 47.4 (H) 11/13/2019   MCV 86 11/13/2019   PLT 283 11/13/2019   Lab Results  Component Value Date   IRON 66 11/13/2019   TIBC 465 (H) 11/13/2019   FERRITIN 30 11/13/2019   Obesity Behavioral Intervention Visit Documentation for Insurance:   Approximately 15 minutes were spent on the discussion below.  ASK: We discussed the diagnosis of obesity with Cassandra [name] today and Robbi agreed to give [place] permission to discuss obesity behavioral modification therapy today.  ASSESS: [name] has the diagnosis of obesity and her BMI today is 32.12. Yamilex is in the action stage of change.   ADVISE: Francee was educated on the multiple health risks of obesity as well as the benefit of weight loss to improve her health. She was advised of the need for long term treatment and the importance of lifestyle modifications to improve her current health and to decrease her risk of future health problems.  AGREE: Multiple dietary modification  options and treatment options were discussed and Salam agreed to follow the recommendations documented in the above note.  ARRANGE: Reena was educated on the importance of frequent visits to treat obesity as outlined per CMS and USPSTF guidelines and agreed to schedule her next follow up appointment today.  Attestation Statements:   Reviewed by clinician on day of visit: allergies, medications, problem list, medical history, surgical history, family history, social history, and previous encounter notes.   I, Trixie Dredge, am acting as transcriptionist for Dennard Nip, MD.  I have reviewed the above documentation for accuracy and completeness, and I agree with the above. - Dennard Nip, MD

## 2019-11-19 ENCOUNTER — Telehealth (INDEPENDENT_AMBULATORY_CARE_PROVIDER_SITE_OTHER): Payer: PPO | Admitting: Psychology

## 2019-11-19 ENCOUNTER — Other Ambulatory Visit: Payer: Self-pay

## 2019-11-19 DIAGNOSIS — F5089 Other specified eating disorder: Secondary | ICD-10-CM

## 2019-11-20 NOTE — Progress Notes (Signed)
Office: [phone]  /  Fax: [phone]    Date: December 04, 2019   Time Seen:2:24pm Duration: 28 minutes Provider: Glennie Isle, Psy.D. Type of Session: Individual Therapy  Type of Contact: Face-to-face  Informed Consent for In-Person Services During COVID-19: During today's appointment, information about the decision to initiate in-person services in light of the UXNAT-55 public health crisis was discussed. [name] and this provider agreed to meet in person for some or all future appointments. If there is a resurgence of the pandemic or other health concerns arise, telepsychological services may be initiated and any related concerns will be discussed and an attempt to address them will be made. Dannisha verbally acknowledged understanding that if necessary, this provider may determine there is a need to initiate telepsychological services for everyone's well-being. Loyal expressed understanding she may request to initiate telepsychological services, and that request will be respected as long as it is feasible and clinically appropriate. Moreover, the risks for opting for in-person services was discussed. Arbadella verbally acknowledged understanding that by coming to the office, she is assuming the risk of exposure to the coronavirus or other public risk. To obtain in-person services, Crisol verbally agreed to taking certain precautions set forth by [facility] to keep everyone safe from exposure, sickness, and possible death. This information was shared by front desk staff either at the time of scheduling and/or during the check-in process. Jaleya expressed understanding that should she not adhere to these safeguards, it may result in starting/returning to a telepsychological service arrangement and/or the exploration of other options for treatment. Avaree acknowledged understanding that [facility] will follow the protocol set forth by [facility] should a patient present with a fever or other  symptoms or disclose recent exposure, which will include rescheduling the appointment. Furthermore, Caden acknowledged understanding that precautions may change if additional local, state or federal orders or guidelines are published. To avoid handling of paper/writing instruments and increasing likelihood of touching, verbal consent was obtained by [name] during today's appointment prior to proceeding. Lelah provided verbal consent to proceed, and acknowledged understanding that by verbally consenting to proceed, she is agreeable to all information noted above.   Session Content: Arlyne is a 74 y.o. female presenting for a follow-up appointment to address the previously established treatment goal of increasing coping skills. The session was initiated with a brief check-in. Shoshanah stated she lost one pound, noting disappointment. She was reportedly switched to journaling and stated she is eating more regularly. She acknowledged challenges consuming all protein. Thus, she was engaged in problem solving. Emotional and physical hunger were reviewed. Psychoeducation regarding triggers for emotional eating was provided. Marybeth was provided a handout, and encouraged to utilize the handout between now and the next appointment to increase awareness of triggers and frequency. [name] agreed. This provider also discussed behavioral strategies for specific triggers, such as placing the utensil down when conversing to avoid mindless eating. Avonne was receptive to today's appointment as evidenced by openness to sharing, responsiveness to feedback, and willingness to explore triggers for emotional eating.  Mental Status Examination:  Appearance: well groomed and appropriate hygiene  Behavior: appropriate to circumstances Mood: euthymic Affect: mood congruent Speech: normal in rate, volume, and tone Eye Contact: appropriate Psychomotor Activity: appropriate Gait: normal Thought Process: linear, logical, and goal directed    Thought Content/Perception: no hallucinations, delusions, bizarre thinking or behavior reported or observed and no evidence of suicidal and homicidal ideation, plan, and intent Orientation: time, person, place, and purpose of appointment  Memory/Concentration: memory, attention, language, and fund of knowledge intact  Insight/Judgment: fair  Interventions:  Conducted a brief chart review Provided empathic reflections and validation Reviewed content from the previous session Employed supportive psychotherapy interventions to facilitate reduced distress and to improve coping skills with identified stressors Psychoeducation provided regarding triggers for emotional eating  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 Amariss has demonstrated progress in her goal as evidenced by increased awareness of hunger patterns.  Plan: The next appointment will be scheduled in approximately two weeks via MyChart Video Visit. The next session will focus on working towards the established treatment goal.

## 2019-11-21 DIAGNOSIS — J3089 Other allergic rhinitis: Secondary | ICD-10-CM | POA: Diagnosis not present

## 2019-11-21 DIAGNOSIS — J3081 Allergic rhinitis due to animal (cat) (dog) hair and dander: Secondary | ICD-10-CM | POA: Diagnosis not present

## 2019-11-25 ENCOUNTER — Other Ambulatory Visit: Payer: Self-pay | Admitting: Internal Medicine

## 2019-11-27 ENCOUNTER — Encounter (INDEPENDENT_AMBULATORY_CARE_PROVIDER_SITE_OTHER): Payer: Self-pay | Admitting: Family Medicine

## 2019-11-27 ENCOUNTER — Ambulatory Visit (INDEPENDENT_AMBULATORY_CARE_PROVIDER_SITE_OTHER): Payer: PPO | Admitting: Family Medicine

## 2019-11-27 ENCOUNTER — Other Ambulatory Visit: Payer: Self-pay

## 2019-11-27 VITALS — BP 118/62 | HR 74 | Temp 98.5°F | Ht 65.0 in | Wt 192.0 lb

## 2019-11-27 DIAGNOSIS — R7303 Prediabetes: Secondary | ICD-10-CM

## 2019-11-27 DIAGNOSIS — K5909 Other constipation: Secondary | ICD-10-CM | POA: Diagnosis not present

## 2019-11-27 DIAGNOSIS — J3089 Other allergic rhinitis: Secondary | ICD-10-CM | POA: Diagnosis not present

## 2019-11-27 DIAGNOSIS — Z6832 Body mass index (BMI) 32.0-32.9, adult: Secondary | ICD-10-CM | POA: Diagnosis not present

## 2019-11-27 DIAGNOSIS — E669 Obesity, unspecified: Secondary | ICD-10-CM | POA: Diagnosis not present

## 2019-11-27 DIAGNOSIS — J301 Allergic rhinitis due to pollen: Secondary | ICD-10-CM | POA: Diagnosis not present

## 2019-11-28 NOTE — Progress Notes (Signed)
Chief Complaint:   OBESITY Kortlynn is here to discuss her progress with her obesity treatment plan along with follow-up of her obesity related diagnoses. Elain is on the Category 2 Plan and states she is following her eating plan approximately 80% of the time. Adlene states she is walking for 15-20 minutes 5 times per week.  Today's visit was #: 2 Starting weight: 193 lbs Starting date: 11/13/2019 Today's weight: 192 lbs Today's date: 11/27/2019 Total lbs lost to date: 1 Total lbs lost since last in-office visit: 1  Interim History: Hiromi struggled to eat everything on her eating plan. She felt it worsened her GERD and she didn't like eating so much protein.  Subjective:   1. Pre-diabetes Magalene has a new diagnosis of pre-diabetes. Her A1c and insulin were elevated. She is not on metformin, and she did well on her diet and weight loss. I discussed labs with the patient today.  2. Other constipation Andilynn has a new diagnosis of constipation. She notes decreased BM frequency on her eating plan, but her BMs are not hard or painful. She denies rectal bleeding.   Assessment/Plan:   1. Pre-diabetes Deriana will continue to work on weight loss, diet, exercise, and decreasing simple carbohydrates to help decrease the risk of diabetes. We will recheck labs in 2 months.  2. Other constipation Josephene was informed that a decrease in bowel movement frequency is normal while losing weight, but stools should not be hard or painful. She will increase water and she was educated that her BMs are normally less frequent while losing weight. Will continue to monitor. Orders and follow up as documented in patient record.   3. Class 1 obesity with serious comorbidity and body mass index (BMI) of 32.0 to 32.9 in adult, unspecified obesity type Shadia is currently in the action stage of change. As such, her goal is to continue with weight loss efforts. She has agreed to change to keeping a food journal and  adhering to recommended goals of 1000-1200 calories and 70+ grams of protein daily.   Exercise goals: As is.  Behavioral modification strategies: increasing lean protein intake, increasing water intake and increasing high fiber foods.  Geral has agreed to follow-up with our clinic in 2 weeks. She was informed of the importance of frequent follow-up visits to maximize her success with intensive lifestyle modifications for her multiple health conditions.   Objective:   Blood pressure 118/62, pulse 74, temperature 98.5 F (36.9 C), temperature source Oral, height 5\' 5"  (1.651 m), weight 192 lb (87.1 kg), SpO2 98 %. Body mass index is 31.95 kg/m.  General: Cooperative, alert, well developed, in no acute distress. HEENT: Conjunctivae and lids unremarkable. Cardiovascular: Regular rhythm.  Lungs: Normal work of breathing. Neurologic: No focal deficits.   Lab Results  Component Value Date   CREATININE 0.81 11/13/2019   BUN 12 11/13/2019   NA 139 11/13/2019   K 4.8 11/13/2019   CL 100 11/13/2019   CO2 24 11/13/2019   Lab Results  Component Value Date   ALT 9 11/13/2019   AST 23 11/13/2019   ALKPHOS 122 (H) 11/13/2019   BILITOT 0.4 11/13/2019   Lab Results  Component Value Date   HGBA1C 5.7 (H) 11/13/2019   HGBA1C 5.4 08/29/2019   HGBA1C 6.1 01/17/2019   HGBA1C 4.9 01/06/2007   Lab Results  Component Value Date   INSULIN 30.1 (H) 11/13/2019   Lab Results  Component Value Date   TSH 1.680 11/13/2019  Lab Results  Component Value Date   CHOL 183 11/13/2019   HDL 61 11/13/2019   LDLCALC 104 (H) 11/13/2019   TRIG 99 11/13/2019   CHOLHDL 3 01/17/2019   Lab Results  Component Value Date   WBC 7.7 11/13/2019   HGB 14.5 11/13/2019   HCT 47.4 (H) 11/13/2019   MCV 86 11/13/2019   PLT 283 11/13/2019   Lab Results  Component Value Date   IRON 66 11/13/2019   TIBC 465 (H) 11/13/2019   FERRITIN 30 11/13/2019    Obesity Behavioral Intervention Documentation for  Insurance:   Approximately 15 minutes were spent on the discussion below.  ASK: We discussed the diagnosis of obesity with [name] today and Zanyah agreed to give [place] permission to discuss obesity behavioral modification therapy today.  ASSESS: Annaliza has the diagnosis of obesity and her BMI today is 31.95. Galadriel is in the action stage of change.   ADVISE: Mel was educated on the multiple health risks of obesity as well as the benefit of weight loss to improve her health. She was advised of the need for long term treatment and the importance of lifestyle modifications to improve her current health and to decrease her risk of future health problems.  AGREE: Multiple dietary modification options and treatment options were discussed and Tristen agreed to follow the recommendations documented in the above note.  ARRANGE: Lynnleigh was educated on the importance of frequent visits to treat obesity as outlined per CMS and USPSTF guidelines and agreed to schedule her next follow up appointment today.  Attestation Statements:   Reviewed by clinician on day of visit: allergies, medications, problem list, medical history, surgical history, family history, social history, and previous encounter notes.   I, Trixie Dredge, am acting as transcriptionist for Dennard Nip, MD.  I have reviewed the above documentation for accuracy and completeness, and I agree with the above. -  Dennard Nip, MD

## 2019-11-29 ENCOUNTER — Encounter (INDEPENDENT_AMBULATORY_CARE_PROVIDER_SITE_OTHER): Payer: Self-pay

## 2019-12-04 ENCOUNTER — Other Ambulatory Visit: Payer: Self-pay

## 2019-12-04 ENCOUNTER — Ambulatory Visit (INDEPENDENT_AMBULATORY_CARE_PROVIDER_SITE_OTHER): Payer: PPO | Admitting: Psychology

## 2019-12-04 DIAGNOSIS — F5089 Other specified eating disorder: Secondary | ICD-10-CM

## 2019-12-04 DIAGNOSIS — J3089 Other allergic rhinitis: Secondary | ICD-10-CM | POA: Diagnosis not present

## 2019-12-04 DIAGNOSIS — J301 Allergic rhinitis due to pollen: Secondary | ICD-10-CM | POA: Diagnosis not present

## 2019-12-05 NOTE — Progress Notes (Unsigned)
Office: [phone]  /  Fax: [phone]    Date: December 19, 2019   Appointment Start Time: *** Duration: *** minutes Provider: Glennie Isle, Psy.D. Type of Session: Individual Therapy  Location of Patient: {gbptloc:23249} Location of Provider: {Location of Service:22491} Type of Contact: Telepsychological Visit via MyChart Video Visit  Session Content: Cassandra [name] is a 74 y.o. female presenting via [place] Visit for a follow-up appointment to address the previously established treatment goal of increasing coping skills. Today's appointment was a telepsychological visit due to COVID-19. [name] provided verbal consent for today's telepsychological appointment and she is aware she is responsible for securing confidentiality on her end of the session. Prior to proceeding with today's appointment, Mechelle's physical location at the time of this appointment was obtained as well a phone number she could be reached at in the event of technical difficulties. [name] and this provider participated in today's telepsychological service.   This provider conducted a brief check-in and verbally administered the PHQ-9 and GAD-7. *** Twisha was receptive to today's appointment as evidenced by openness to sharing, responsiveness to feedback, and {gbreceptiveness:23401}.  Mental Status Examination:  Appearance: {Appearance:22431} Behavior: {Behavior:22445} Mood: {gbmood:21757} Affect: {Affect:22436} Speech: {Speech:22432} Eye Contact: {Eye Contact:22433} Psychomotor Activity: {Motor Activity:22434} Gait: {gbgait:23404} Thought Process: {thought process:22448}  Thought Content/Perception: {disturbances:22451} Orientation: {Orientation:22437} Memory/Concentration: {gbcognition:22449} Insight/Judgment: {Insight:22446}  Structured Assessments Results: The Patient Health Questionnaire-9 (PHQ-9) is a self-report measure that assesses symptoms and severity of depression over the course of the last two weeks.  Kilani obtained a score of *** suggesting {GBPHQ9SEVERITY:21752}. Denita finds the endorsed symptoms to be {gbphq9difficulty:21754}. [0= Not at all; 1= Several days; 2= More than half the days; 3= Nearly every day] Little interest or pleasure in doing things ***  Feeling down, depressed, or hopeless ***  Trouble falling or staying asleep, or sleeping too much ***  Feeling tired or having little energy ***  Poor appetite or overeating ***  Feeling bad about yourself --- or that you are a failure or have let yourself or your family down ***  Trouble concentrating on things, such as reading the newspaper or watching television ***  Moving or speaking so slowly that other people could have noticed? Or the opposite --- being so fidgety or restless that you have been moving around a lot more than usual ***  Thoughts that you would be better off dead or hurting yourself in some way ***  PHQ-9 Score ***    The Generalized Anxiety Disorder-7 (GAD-7) is a brief self-report measure that assesses symptoms of anxiety over the course of the last two weeks. [name] obtained a score of *** suggesting {gbgad7severity:21753}. Keilyn finds the endorsed symptoms to be {gbphq9difficulty:21754}. [0= Not at all; 1= Several days; 2= Over half the days; 3= Nearly every day] Feeling nervous, anxious, on edge ***  Not being able to stop or control worrying ***  Worrying too much about different things ***  Trouble relaxing ***  Being so restless that it's hard to sit still ***  Becoming easily annoyed or irritable ***  Feeling afraid as if something awful might happen ***  GAD-7 Score ***   Interventions:  {Interventions for Progress Notes:23405}  DSM-5 Diagnosis(es): 307.59 (F50.8) Other Specified Feeding or Eating Disorder, Emotional Eating Behaviors  Treatment Goal & Progress: During the initial appointment with this provider, the following treatment goal was established: increase coping skills. Elleanor has  demonstrated progress in her goal as evidenced by {gbtxprogress:22839}. Carolan also {gbtxprogress2:22951}.  Plan: The next appointment will  be scheduled in {gbweeks:21758}, which will be {gbtxmodality:23402}. The next session will focus on {Plan for Next Appointment:23400}.

## 2019-12-10 ENCOUNTER — Other Ambulatory Visit: Payer: Self-pay | Admitting: Internal Medicine

## 2019-12-10 ENCOUNTER — Encounter: Payer: Self-pay | Admitting: Internal Medicine

## 2019-12-11 DIAGNOSIS — J301 Allergic rhinitis due to pollen: Secondary | ICD-10-CM | POA: Diagnosis not present

## 2019-12-11 DIAGNOSIS — J3089 Other allergic rhinitis: Secondary | ICD-10-CM | POA: Diagnosis not present

## 2019-12-12 ENCOUNTER — Encounter: Payer: Self-pay | Admitting: Family Medicine

## 2019-12-12 ENCOUNTER — Other Ambulatory Visit: Payer: Self-pay | Admitting: Family Medicine

## 2019-12-12 ENCOUNTER — Other Ambulatory Visit: Payer: Self-pay

## 2019-12-12 ENCOUNTER — Ambulatory Visit: Payer: PPO | Admitting: Family Medicine

## 2019-12-12 DIAGNOSIS — G2581 Restless legs syndrome: Secondary | ICD-10-CM | POA: Diagnosis not present

## 2019-12-12 DIAGNOSIS — M545 Low back pain: Secondary | ICD-10-CM

## 2019-12-12 DIAGNOSIS — G8929 Other chronic pain: Secondary | ICD-10-CM | POA: Diagnosis not present

## 2019-12-12 DIAGNOSIS — M1712 Unilateral primary osteoarthritis, left knee: Secondary | ICD-10-CM | POA: Diagnosis not present

## 2019-12-12 NOTE — Progress Notes (Signed)
[place] [facility] Phone: [phone] Subjective:   Cassandra [name], am serving as a scribe for Dr. Hulan Saas. This visit occurred during the SARS-CoV-2 public health emergency.  Safety protocols were in place, including screening questions prior to the visit, additional usage of staff PPE, and extensive cleaning of exam room while observing appropriate contact time as indicated for disinfecting solutions.   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  We discussed again about over-the-counter medications, continuing to monitor diet.  Does have fibromyalgia which makes it somewhat difficult as well.  Multiple different small allergies also has made it difficult.  Patient will continue to try to be active and if worsening symptoms consider injection  Chronic problem.  Patient was making some strides.  Patient does have mild exacerbation here compared to where she was she states a couple weeks ago.  Continue the gabapentin, and discussed the over-the-counter medicines.  Patient was scheduled and try to do the home exercises on her own before she would start physical therapy.  X-rays ordered today secondary to the significant tightness noted.  Follow-up with me again in 6 weeks.  Update 12/12/2019 Cassandra [name] is a 74 y.o. female coming in with complaint of left knee and low back pain. Patient states that she is having leg pain when she goes to bed. Left knee pain is improving. If she miss steps when walking she has pain.  Patient has been taking tart cherry for the pseudogout-like changes.  Has helped her with some of the pain but now having more difficulty with sleep again  Lower back pain continues to bother patient. Pain increases with deep knee bending. Using tart  cherry extract. Discontinued Ambien but is not waking up at 1am. Patient needs refill of gabapentin if she is suppose to continue medication.      Past Medical History:  Diagnosis Date  . Anemia   . Anxiety   . Asthma   . Back pain   . BPPV (benign paroxysmal positional vertigo)   . Chronic allergic conjunctivitis   . Depression   . Diverticulosis   . Fibromyalgia   . Gallbladder problem   . Gallstones   . GERD (gastroesophageal reflux disease)   . Hiatal hernia   . History of bladder stone   . Iron (Fe) deficiency anemia   . Joint pain   . Lump in female breast   . Mild persistent asthma    followed by pcp--- dr r. sharma ([place] allergy/asthma)  . OA (osteoarthritis)    knees, hands  . OAB (overactive bladder)   . OSA (obstructive sleep apnea)    per pt has not used cpap several years  . Pancreatic cyst   . PONV (postoperative nausea and vomiting)   . RLS (restless legs syndrome)   . Seasonal and perennial allergic rhinitis   . SUI (stress urinary incontinence, female)   . Swallowing difficulty   . Vitamin B12 deficiency   . Vitamin D deficiency   . Wears glasses    Past Surgical History:  Procedure Laterality Date  . BLADDER SUSPENSION  1990's   sling  . bladder tacking  2010   with mesh  . BREAST LUMPECTOMY Left 1985   Benign cyst  .  BREAST REDUCTION SURGERY Bilateral 2003 approx.  . CORNEA LESION EXCISION Right 2005  approx.  . CYSTOSCOPY N/A 07/12/2016   Procedure: CYSTOSCOPY, VAGINOSCOPY, EXAM UNDER ANESTHESIA, STONE LITHOTRIPSY,;  Surgeon: Cleon Gustin, MD;  Location: [facility];  Service: Urology;  Laterality: N/A;  . CYSTOSCOPY N/A 10/11/2016   Procedure: CYSTOSCOPY;  Surgeon: Cleon Gustin, MD;  Location: [facility];  Service: Urology;  Laterality: N/A;  . CYSTOSCOPY WITH LITHOLAPAXY N/A 10/11/2016   Procedure: CYSTOSCOPY WITH LITHOLAPAXY/ EXCISION OF MESH;  Surgeon: Cleon Gustin, MD;  Location: [facility];  Service: Urology;  Laterality: N/A;  . CYSTOSCOPY WITH LITHOLAPAXY N/A 01/22/2019   Procedure: CYSTOSCOPY WITH LITHOLAPAXY;  Surgeon: Cleon Gustin, MD;  Location[facility];  Service: Urology;  Laterality: N/A;  1 HR  . HOLMIUM LASER APPLICATION  8/85/0277   Procedure: HOLMIUM LASER APPLICATION;  Surgeon: Cleon Gustin, MD;  Location: [facility];  Service: Urology;;  . HOLMIUM LASER APPLICATION N/A 09/09/8784   Procedure: HOLMIUM LASER APPLICATION;  Surgeon: Cleon Gustin, MD;  Location: [facility];  Service: Urology;  Laterality: N/A;  . RIGHT KNEE ARTHROSCOPY  2013  . TOTAL KNEE ARTHROPLASTY Right 09/12/2012   Procedure: RIGHT TOTAL KNEE ARTHROPLASTY;  Surgeon: Gearlean Alf, MD;  Location: [facility];  Service: Orthopedics;  Laterality: Right;  Marland Kitchen VAGINAL HYSTERECTOMY  1984   Social History   Socioeconomic History  . Marital status: Widowed    Spouse name: Not on file  . Number of children: Not on file  . Years of education: Not on file  . Highest education level: Not on file  Occupational History  . Occupation: Biochemist, clinical Retired  Tobacco Use  . Smoking status: Never Smoker  . Smokeless tobacco: Never Used  Vaping Use  . Vaping Use: Never used  Substance and Sexual Activity  . Alcohol use: Yes    Comment: rare  . Drug use: Never  . Sexual activity: Not on file  Other Topics Concern  . Not on file  Social History Narrative  . Not on file   Social Determinants of Health   Financial Resource Strain:   . Difficulty of Paying Living Expenses:   Food Insecurity:   . Worried About Charity fundraiser in the Last Year:   . Arboriculturist in the Last Year:   Transportation Needs:   . Film/video editor (Medical):   Marland Kitchen Lack of Transportation (Non-Medical):   Physical Activity:   . Days of Exercise per Week:   . Minutes of Exercise per Session:   Stress:   . Feeling of  Stress :   Social Connections:   . Frequency of Communication with Friends and Family:   . Frequency of Social Gatherings with Friends and Family:   . Attends Religious Services:   . Active Member of Clubs or Organizations:   . Attends Archivist Meetings:   Marland Kitchen Marital Status:    Allergies  Allergen Reactions  . Shellfish Allergy Hives  . Cephalosporins Hives  . Hydrochlorothiazide Hives  . Penicillins Hives  . Sulfa Antibiotics Hives   Family History  Problem Relation Age of Onset  . Kidney cancer Mother   . Diabetes Mother   . Obesity Mother   . Alcoholism Father   . Colon cancer Neg Hx   . Inflammatory bowel disease Neg Hx   . Liver disease Neg Hx   .  Pancreatic cancer Neg Hx   . Stomach cancer Neg Hx   . Rectal cancer Neg Hx      Current Outpatient Medications (Cardiovascular):  Marland Kitchen  EPINEPHrine 0.3 mg/0.3 mL IJ SOAJ injection, Inject 0.3 mg into the muscle as needed for anaphylaxis. Marland Kitchen  spironolactone (ALDACTONE) 25 MG tablet, TAKE 1 TABLET BY MOUTH EVERY DAY  Current Outpatient Medications (Respiratory):  Marland Kitchen  BREO ELLIPTA 200-25 MCG/INH AEPB, Inhale 1 puff into the lungs daily.  Marland Kitchen  levocetirizine (XYZAL) 5 MG tablet, Take 5 mg by mouth every evening.     Current Outpatient Medications (Other):  Marland Kitchen  Cholecalciferol (VITAMIN D-3) 125 MCG (5000 UT) TABS, Take 1 capsule by mouth daily.  Marland Kitchen  gabapentin (NEURONTIN) 100 MG capsule, Take 2 capsules (200 mg total) by mouth at bedtime. .  Misc Natural Products (TART CHERRY ADVANCED) CAPS, Take 1 capsule by mouth. 1200mg  capsule .  omeprazole (PRILOSEC) 20 MG capsule, Take 1 capsule (20 mg total) by mouth 2 (two) times daily before a meal. (Patient taking differently: Take 20 mg by mouth daily. ) .  psyllium (REGULOID) 0.52 g capsule, Take 0.52 g by mouth daily. Marland Kitchen  zolpidem (AMBIEN) 5 MG tablet, TAKE 1 TABLET BY MOUTH EVERYDAY AT BEDTIME   Reviewed prior external information including notes and imaging from  primary  care provider As well as notes that were available from care everywhere and other healthcare systems.  Past medical history, social, surgical and family history all reviewed in electronic medical record.  [name] pertanent information unless stated regarding to the chief complaint.   Review of Systems:  [name] headache, visual changes, nausea, vomiting, diarrhea, constipation, dizziness, abdominal pain, skin rash, fevers, chills, night sweats, weight loss, swollen lymph nodes, , joint swelling, chest pain, shortness of breath, mood changes. POSITIVE muscle aches, body aches  Objective  Blood pressure 122/72, pulse 70, height 5\' 5"  (1.651 m), weight 199 lb (90.3 kg), SpO2 96 %.   General: [name] apparent distress alert and oriented x3 mood and affect normal, dressed appropriately.  Patient is having some mild tangential thought process HEENT: Pupils equal, extraocular movements intact  Respiratory: Patient's speak in full sentences and does not appear short of breath  Cardiovascular: [name] lower extremity edema, non tender, [name] erythema  Neuro: Cranial nerves II through XII are intact, neurovascularly intact in all extremities with 2+ DTRs and 2+ pulses.  Gait antalgic MSK: Diffuse arthritic changes of the multiple joint. Left knee does have some instability with varus and valgus force.  Tenderness to palpation over the medial joint line.  Crepitus noted with range of motion.  Near full range of motion of motion.  Contralateral knee is replacement Back exam shows the patient has tightness more in the cervical thoracic and lumbosacral areas today.  Patient's back is tender to palpation diffusely.  Negative straight leg test but does have tightness with straight leg test.   Impression and Recommendations:     The above documentation has been reviewed and is accurate and complete Lyndal Pulley, DO       Note: This dictation was prepared with Dragon dictation along with smaller phrase technology. Any  transcriptional errors that result from this process are unintentional.

## 2019-12-12 NOTE — Assessment & Plan Note (Signed)
Patient was to continue with conservative therapy.  Could potentially be a candidate for injections if needed.  He is going to be a chronic problem.  We discussed the possibility of formal physical therapy or bracing.  Patient wants to continue with conservative therapy at this time follow-up in 2 months

## 2019-12-12 NOTE — Assessment & Plan Note (Signed)
Discussed with patient having the history of iron deficiency encourage her to do supplementation.

## 2019-12-12 NOTE — Patient Instructions (Signed)
Good to see you Back exercises Continue gabapentin Iron 65 mg with 500 vitamin c Ask weight doctor DHEA 50 mg for 4 weeks to help jump start weight loss Be active when you can Half tab of abient at night See me again in 7-8 weeks

## 2019-12-12 NOTE — Assessment & Plan Note (Signed)
Chronic overall.  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  Encouraged her to continue the gabapentin.  Discussed the possibility of formal physical therapy but patient declined at the moment.  No other changes.  Follow-up in 4 to 8 weeks total time with patient reviewing patient's previous imaging, office notes and discussing with her 32 minutes

## 2019-12-13 ENCOUNTER — Ambulatory Visit (INDEPENDENT_AMBULATORY_CARE_PROVIDER_SITE_OTHER): Payer: PPO | Admitting: Family Medicine

## 2019-12-13 ENCOUNTER — Encounter (INDEPENDENT_AMBULATORY_CARE_PROVIDER_SITE_OTHER): Payer: Self-pay | Admitting: Family Medicine

## 2019-12-13 ENCOUNTER — Other Ambulatory Visit: Payer: Self-pay

## 2019-12-13 VITALS — BP 110/70 | HR 76 | Temp 97.9°F | Ht 65.0 in | Wt 194.0 lb

## 2019-12-13 DIAGNOSIS — D508 Other iron deficiency anemias: Secondary | ICD-10-CM

## 2019-12-13 DIAGNOSIS — R948 Abnormal results of function studies of other organs and systems: Secondary | ICD-10-CM | POA: Diagnosis not present

## 2019-12-13 DIAGNOSIS — Z6832 Body mass index (BMI) 32.0-32.9, adult: Secondary | ICD-10-CM

## 2019-12-13 DIAGNOSIS — E669 Obesity, unspecified: Secondary | ICD-10-CM | POA: Diagnosis not present

## 2019-12-17 NOTE — Progress Notes (Signed)
Chief Complaint:   OBESITY Griffin is here to discuss her progress with her obesity treatment plan along with follow-up of her obesity related diagnoses. Markiesha is on keeping a food journal and adhering to recommended goals of 1000-1200 calories and 70+ grams of protein daily and states she is following her eating plan approximately 80% of the time. Luba states she is walking for 20 minutes 7 times per week.  Today's visit was #: 3 Starting weight: 193 lbs Starting date: 11/13/2019 Today's weight: 194 lbs Today's date: 12/13/2019 Total lbs lost to date: 0 Total lbs lost since last in-office visit: 0  Interim History: Lera is struggling with weight loss. We changed her to journaling and she has done well but she has been doing more guestimation especially with eating out. This may be part of the problem.  Subjective:   1. Abnormal metabolism Dezire has questions about taking DHEA to help jump start her RMR and weight loss efforts.  2. Other iron deficiency anemia Aislin had questions about how to start OTC iron and Vit C.  Assessment/Plan:   1. Abnormal metabolism Sharnetta was educated that [place] does not have strong evidence in her age range and athletic level overall, but can increase her hair loss and cholesterol. So she was advised to avoid this.  2. Other iron deficiency anemia Kelsye was encouraged to follow Dr. Thompson Caul recommendations of 65 mg iron and +500 mg Vit C daily, and will continue to eat on an iron rich diet. Orders and follow up as documented in patient record.  3. Class 1 obesity with serious comorbidity and body mass index (BMI) of 32.0 to 32.9 in adult, unspecified obesity type Adileny is currently in the action stage of change. As such, her goal is to continue with weight loss efforts. She has agreed to keeping a food journal and adhering to recommended goals of 1000--1200 calories and 70+ grams of protein daily.   Tineshia will continue journaling and work on avoiding  "guestimation" at situations. We will repeat IC at her next visit.  Exercise goals: As is.  Behavioral modification strategies: decreasing eating out and keeping a strict food journal.  Sarahanne has agreed to follow-up with our clinic in 2 weeks. She was informed of the importance of frequent follow-up visits to maximize her success with intensive lifestyle modifications for her multiple health conditions.   Objective:   Blood pressure 110/70, pulse 76, temperature 97.9 F (36.6 C), temperature source Oral, height 5\' 5"  (1.651 m), weight 194 lb (88 kg), SpO2 97 %. Body mass index is 32.28 kg/m.  General: Cooperative, alert, well developed, in no acute distress. HEENT: Conjunctivae and lids unremarkable. Cardiovascular: Regular rhythm.  Lungs: Normal work of breathing. Neurologic: No focal deficits.   Lab Results  Component Value Date   CREATININE 0.81 11/13/2019   BUN 12 11/13/2019   NA 139 11/13/2019   K 4.8 11/13/2019   CL 100 11/13/2019   CO2 24 11/13/2019   Lab Results  Component Value Date   ALT 9 11/13/2019   AST 23 11/13/2019   ALKPHOS 122 (H) 11/13/2019   BILITOT 0.4 11/13/2019   Lab Results  Component Value Date   HGBA1C 5.7 (H) 11/13/2019   HGBA1C 5.4 08/29/2019   HGBA1C 6.1 01/17/2019   HGBA1C 4.9 01/06/2007   Lab Results  Component Value Date   INSULIN 30.1 (H) 11/13/2019   Lab Results  Component Value Date   TSH 1.680 11/13/2019   Lab Results  Component Value Date   CHOL 183 11/13/2019   HDL 61 11/13/2019   LDLCALC 104 (H) 11/13/2019   TRIG 99 11/13/2019   CHOLHDL 3 01/17/2019   Lab Results  Component Value Date   WBC 7.7 11/13/2019   HGB 14.5 11/13/2019   HCT 47.4 (H) 11/13/2019   MCV 86 11/13/2019   PLT 283 11/13/2019   Lab Results  Component Value Date   IRON 66 11/13/2019   TIBC 465 (H) 11/13/2019   FERRITIN 30 11/13/2019   Attestation Statements:   Reviewed by clinician on day of visit: allergies, medications, problem list,  medical history, surgical history, family history, social history, and previous encounter notes.  Time spent on visit including pre-visit chart review and post-visit care and charting was 33 minutes.    I, Trixie Dredge, am acting as transcriptionist for Dennard Nip, MD.  I have reviewed the above documentation for accuracy and completeness, and I agree with the above. -  Dennard Nip, MD

## 2019-12-19 ENCOUNTER — Telehealth (INDEPENDENT_AMBULATORY_CARE_PROVIDER_SITE_OTHER): Payer: Self-pay | Admitting: Psychology

## 2019-12-19 ENCOUNTER — Encounter: Payer: Self-pay | Admitting: Internal Medicine

## 2019-12-19 DIAGNOSIS — J301 Allergic rhinitis due to pollen: Secondary | ICD-10-CM | POA: Diagnosis not present

## 2019-12-19 DIAGNOSIS — J3089 Other allergic rhinitis: Secondary | ICD-10-CM | POA: Diagnosis not present

## 2019-12-26 DIAGNOSIS — J3089 Other allergic rhinitis: Secondary | ICD-10-CM | POA: Diagnosis not present

## 2019-12-26 DIAGNOSIS — J3081 Allergic rhinitis due to animal (cat) (dog) hair and dander: Secondary | ICD-10-CM | POA: Diagnosis not present

## 2019-12-26 DIAGNOSIS — J301 Allergic rhinitis due to pollen: Secondary | ICD-10-CM | POA: Diagnosis not present

## 2020-01-01 ENCOUNTER — Ambulatory Visit (INDEPENDENT_AMBULATORY_CARE_PROVIDER_SITE_OTHER): Payer: PPO | Admitting: Family Medicine

## 2020-01-01 ENCOUNTER — Other Ambulatory Visit: Payer: Self-pay

## 2020-01-01 ENCOUNTER — Encounter (INDEPENDENT_AMBULATORY_CARE_PROVIDER_SITE_OTHER): Payer: Self-pay | Admitting: Family Medicine

## 2020-01-01 VITALS — BP 120/78 | HR 68 | Temp 98.3°F | Ht 65.0 in | Wt 194.0 lb

## 2020-01-01 DIAGNOSIS — E669 Obesity, unspecified: Secondary | ICD-10-CM | POA: Diagnosis not present

## 2020-01-01 DIAGNOSIS — R7303 Prediabetes: Secondary | ICD-10-CM

## 2020-01-01 DIAGNOSIS — J4530 Mild persistent asthma, uncomplicated: Secondary | ICD-10-CM | POA: Diagnosis not present

## 2020-01-01 DIAGNOSIS — Z91030 Bee allergy status: Secondary | ICD-10-CM | POA: Diagnosis not present

## 2020-01-01 DIAGNOSIS — J3089 Other allergic rhinitis: Secondary | ICD-10-CM | POA: Diagnosis not present

## 2020-01-01 DIAGNOSIS — Z6832 Body mass index (BMI) 32.0-32.9, adult: Secondary | ICD-10-CM | POA: Diagnosis not present

## 2020-01-01 DIAGNOSIS — J301 Allergic rhinitis due to pollen: Secondary | ICD-10-CM | POA: Diagnosis not present

## 2020-01-01 MED ORDER — METFORMIN HCL 500 MG PO TABS: 500.0000 mg | ORAL_TABLET | Freq: Every day | ORAL | 0 refills | Status: DC

## 2020-01-01 NOTE — Progress Notes (Signed)
Chief Complaint:   OBESITY Cassandra [name] is here to discuss her progress with her obesity treatment plan along with follow-up of her obesity related diagnoses. Cassandra [name] is on keeping a food journal and adhering to recommended goals of 1000-1200 calories and 70+ grams of protein daily and states she is following her eating plan approximately 80% of the time. Cassandra [name] states she is walking, doing bed exercises and physical therapy for 20-40 minutes 7 times per week.  Today's visit was #: 4 Starting weight: 193 lbs Starting date: 11/13/2019 Today's weight: 194 lbs Today's date: 01/01/2020 Total lbs lost to date: 0 Total lbs lost since last in-office visit: 0  Interim History: Cassandra [name] continues to maintain her weight. We had planned on repeating her IC today to recheck her RMR, but apparently she was not told to show up 30 minutes early. She continues to struggle with weight loss and she has tried to keep track of her protein intake.  Subjective:   1. Pre-diabetes Cassandra [name] is loosely on diet and weight loss, but she is struggling to lose weight which may be due to her insulin resistance.  Assessment/Plan:   1. Pre-diabetes Cassandra [name] will continue to work on weight loss, exercise, and decreasing simple carbohydrates to help decrease the risk of diabetes. Cassandra [name] agreed to start metformin 500 mg q AM with food, with no refills and will follow up.  - metFORMIN (GLUCOPHAGE) 500 MG tablet; Take 1 tablet (500 mg total) by mouth daily with breakfast.  Dispense: 30 tablet; Refill: 0  2. Class 1 obesity with serious comorbidity and body mass index (BMI) of 32.0 to 32.9 in adult, unspecified obesity type Cassandra [name] is currently in the action stage of change. As such, her goal is to continue with weight loss efforts. She has agreed to keeping a food journal and adhering to recommended goals of 1000-1200 calories and 70+ grams of protein daily.   We will plan to recheck RMR at her next visit, Cassandra [name] was informed to.  Exercise  goals: As is.  Behavioral modification strategies: increasing lean protein intake and keeping a strict food journal.  Cassandra [name] has agreed to follow-up with our clinic in 3 weeks. She was informed of the importance of frequent follow-up visits to maximize her success with intensive lifestyle modifications for her multiple health conditions.   Objective:   Blood pressure 120/78, pulse 68, temperature 98.3 F (36.8 C), temperature source Oral, height 5\' 5"  (1.651 m), weight 194 lb (88 kg), SpO2 95 %. Body mass index is 32.28 kg/m.  General: Cooperative, alert, well developed, in no acute distress. HEENT: Conjunctivae and lids unremarkable. Cardiovascular: Regular rhythm.  Lungs: Normal work of breathing. Neurologic: No focal deficits.   Lab Results  Component Value Date   CREATININE 0.81 11/13/2019   BUN 12 11/13/2019   NA 139 11/13/2019   K 4.8 11/13/2019   CL 100 11/13/2019   CO2 24 11/13/2019   Lab Results  Component Value Date   ALT 9 11/13/2019   AST 23 11/13/2019   ALKPHOS 122 (H) 11/13/2019   BILITOT 0.4 11/13/2019   Lab Results  Component Value Date   HGBA1C 5.7 (H) 11/13/2019   HGBA1C 5.4 08/29/2019   HGBA1C 6.1 01/17/2019   HGBA1C 4.9 01/06/2007   Lab Results  Component Value Date   INSULIN 30.1 (H) 11/13/2019   Lab Results  Component Value Date   TSH 1.680 11/13/2019   Lab Results  Component Value Date   CHOL 183 11/13/2019  HDL 61 11/13/2019   LDLCALC 104 (H) 11/13/2019   TRIG 99 11/13/2019   CHOLHDL 3 01/17/2019   Lab Results  Component Value Date   WBC 7.7 11/13/2019   HGB 14.5 11/13/2019   HCT 47.4 (H) 11/13/2019   MCV 86 11/13/2019   PLT 283 11/13/2019   Lab Results  Component Value Date   IRON 66 11/13/2019   TIBC 465 (H) 11/13/2019   FERRITIN 30 11/13/2019   Attestation Statements:   Reviewed by clinician on day of visit: allergies, medications, problem list, medical history, surgical history, family history, social history, and  previous encounter notes.  Time spent on visit including pre-visit chart review and post-visit care and charting was 35 minutes.    I, Trixie Dredge, am acting as transcriptionist for Dennard Nip, MD.  I have reviewed the above documentation for accuracy and completeness, and I agree with the above. -  Dennard Nip, MD

## 2020-01-09 DIAGNOSIS — J301 Allergic rhinitis due to pollen: Secondary | ICD-10-CM | POA: Diagnosis not present

## 2020-01-09 DIAGNOSIS — J3089 Other allergic rhinitis: Secondary | ICD-10-CM | POA: Diagnosis not present

## 2020-01-15 DIAGNOSIS — J301 Allergic rhinitis due to pollen: Secondary | ICD-10-CM | POA: Diagnosis not present

## 2020-01-15 DIAGNOSIS — J3089 Other allergic rhinitis: Secondary | ICD-10-CM | POA: Diagnosis not present

## 2020-01-22 DIAGNOSIS — J3089 Other allergic rhinitis: Secondary | ICD-10-CM | POA: Diagnosis not present

## 2020-01-22 DIAGNOSIS — Z124 Encounter for screening for malignant neoplasm of cervix: Secondary | ICD-10-CM | POA: Diagnosis not present

## 2020-01-22 DIAGNOSIS — Z01419 Encounter for gynecological examination (general) (routine) without abnormal findings: Secondary | ICD-10-CM | POA: Diagnosis not present

## 2020-01-22 DIAGNOSIS — J301 Allergic rhinitis due to pollen: Secondary | ICD-10-CM | POA: Diagnosis not present

## 2020-01-22 DIAGNOSIS — Z6832 Body mass index (BMI) 32.0-32.9, adult: Secondary | ICD-10-CM | POA: Diagnosis not present

## 2020-01-22 DIAGNOSIS — J3081 Allergic rhinitis due to animal (cat) (dog) hair and dander: Secondary | ICD-10-CM | POA: Diagnosis not present

## 2020-01-22 DIAGNOSIS — N952 Postmenopausal atrophic vaginitis: Secondary | ICD-10-CM | POA: Diagnosis not present

## 2020-01-24 ENCOUNTER — Other Ambulatory Visit (INDEPENDENT_AMBULATORY_CARE_PROVIDER_SITE_OTHER): Payer: Self-pay | Admitting: Family Medicine

## 2020-01-24 DIAGNOSIS — R7303 Prediabetes: Secondary | ICD-10-CM

## 2020-01-28 ENCOUNTER — Ambulatory Visit (INDEPENDENT_AMBULATORY_CARE_PROVIDER_SITE_OTHER): Payer: PPO | Admitting: Family Medicine

## 2020-01-28 ENCOUNTER — Other Ambulatory Visit: Payer: Self-pay

## 2020-01-28 ENCOUNTER — Encounter (INDEPENDENT_AMBULATORY_CARE_PROVIDER_SITE_OTHER): Payer: Self-pay | Admitting: Family Medicine

## 2020-01-28 VITALS — BP 110/71 | HR 68 | Temp 98.0°F | Ht 65.0 in | Wt 193.0 lb

## 2020-01-28 DIAGNOSIS — G4733 Obstructive sleep apnea (adult) (pediatric): Secondary | ICD-10-CM

## 2020-01-28 DIAGNOSIS — R7303 Prediabetes: Secondary | ICD-10-CM

## 2020-01-28 DIAGNOSIS — Z6832 Body mass index (BMI) 32.0-32.9, adult: Secondary | ICD-10-CM | POA: Diagnosis not present

## 2020-01-28 DIAGNOSIS — E669 Obesity, unspecified: Secondary | ICD-10-CM

## 2020-01-28 MED ORDER — METFORMIN HCL 500 MG PO TABS: 500.0000 mg | ORAL_TABLET | Freq: Two times a day (BID) | ORAL | 1 refills | Status: DC

## 2020-01-28 NOTE — Progress Notes (Signed)
Chief Complaint:   OBESITY Shanese is here to discuss her progress with her obesity treatment plan along with follow-up of her obesity related diagnoses. Sandar is on keeping a food journal and adhering to recommended goals of 1000-1200 calories and 70+ grams of protein daily and states she is following her eating plan approximately 80% of the time. Zamarah states she is walking for 30-40 minutes 7 times per week.  Today's visit was #: 5 Starting weight: 193 lbs Starting date: 11/13/2019 Today's weight: 193 lbs Today's date: 01/28/2020 Total lbs lost to date: 0 Total lbs lost since last in-office visit: 1  Interim History: Coree continues to struggle with weight loss even though she is doing better with journaling.  Subjective:   1. Pre-diabetes Nykiah is on metformin, but she still struggles with weight loss.  2. Obstructive sleep apnea Benigna requests an IC test today to recheck her VO2 and her RMR. She is not using her CPAP as her diagnosis was mild, and she didn't tolerate the mask due to allergies.  Assessment/Plan:   1. Pre-diabetes Ravneet will continue to work on weight loss, exercise, and decreasing simple carbohydrates to help decrease the risk of diabetes. Briawna agreed to increase metformin to 500 mg BID with food #60 with 1 refill.  - metFORMIN (GLUCOPHAGE) 500 MG tablet; Take 1 tablet (500 mg total) by mouth 2 (two) times daily with a meal.  Dispense: 60 tablet; Refill: 1  2. Obstructive sleep apnea Intensive lifestyle modifications are the first line treatment for this issue. We discussed several lifestyle modifications today. Oluwasemilore will continue to work on diet, exercise and weight loss efforts. We will continue to monitor. Chianti is to make an appointment with Dr. Sylvan Cheese at [facility] for a follow up evaluation, as this may be contributing to inability to lose weight. Orders and follow up as documented in patient record.   3. Class 1 obesity with serious  comorbidity and body mass index (BMI) of 32.0 to 32.9 in adult, unspecified obesity type Tenise is currently in the action stage of change. As such, her goal is to continue with weight loss efforts. She has agreed to keeping a food journal and adhering to recommended goals of 1200 calories and 70+ grams of protein daily.   Exercise goals: As is.  Behavioral modification strategies: increasing lean protein intake and decreasing simple carbohydrates.  Erynn has agreed to follow-up with our clinic as needed. She was informed of the importance of frequent follow-up visits to maximize her success with intensive lifestyle modifications for her multiple health conditions.   Objective:   Blood pressure 110/71, pulse 68, temperature 98 F (36.7 C), temperature source Oral, height 5\' 5"  (1.651 m), weight 193 lb (87.5 kg), SpO2 95 %. Body mass index is 32.12 kg/m.  General: Cooperative, alert, well developed, in no acute distress. HEENT: Conjunctivae and lids unremarkable. Cardiovascular: Regular rhythm.  Lungs: Normal work of breathing. Neurologic: No focal deficits.   Lab Results  Component Value Date   CREATININE 0.81 11/13/2019   BUN 12 11/13/2019   NA 139 11/13/2019   K 4.8 11/13/2019   CL 100 11/13/2019   CO2 24 11/13/2019   Lab Results  Component Value Date   ALT 9 11/13/2019   AST 23 11/13/2019   ALKPHOS 122 (H) 11/13/2019   BILITOT 0.4 11/13/2019   Lab Results  Component Value Date   HGBA1C 5.7 (H) 11/13/2019   HGBA1C 5.4 08/29/2019   HGBA1C 6.1 01/17/2019  HGBA1C 4.9 01/06/2007   Lab Results  Component Value Date   INSULIN 30.1 (H) 11/13/2019   Lab Results  Component Value Date   TSH 1.680 11/13/2019   Lab Results  Component Value Date   CHOL 183 11/13/2019   HDL 61 11/13/2019   LDLCALC 104 (H) 11/13/2019   TRIG 99 11/13/2019   CHOLHDL 3 01/17/2019   Lab Results  Component Value Date   WBC 7.7 11/13/2019   HGB 14.5 11/13/2019   HCT 47.4 (H) 11/13/2019     MCV 86 11/13/2019   PLT 283 11/13/2019   Lab Results  Component Value Date   IRON 66 11/13/2019   TIBC 465 (H) 11/13/2019   FERRITIN 30 11/13/2019    Obesity Behavioral Intervention Documentation for Insurance:   Approximately 15 minutes were spent on the discussion below.  ASK: We discussed the diagnosis of obesity with [name] today and Princella agreed to give [place] permission to discuss obesity behavioral modification therapy today.  ASSESS: Eartha has the diagnosis of obesity and her BMI today is 32.12. Meyah is in the action stage of change.   ADVISE: Khaleelah was educated on the multiple health risks of obesity as well as the benefit of weight loss to improve her health. She was advised of the need for long term treatment and the importance of lifestyle modifications to improve her current health and to decrease her risk of future health problems.  AGREE: Multiple dietary modification options and treatment options were discussed and Nomie agreed to follow the recommendations documented in the above note.  ARRANGE: Jahnae was educated on the importance of frequent visits to treat obesity as outlined per CMS and USPSTF guidelines and agreed to schedule her next follow up appointment today.  Attestation Statements:   Reviewed by clinician on day of visit: allergies, medications, problem list, medical history, surgical history, family history, social history, and previous encounter notes.   I, Trixie Dredge, am acting as transcriptionist for Dennard Nip, MD.  I have reviewed the above documentation for accuracy and completeness, and I agree with the above. -  Dennard Nip, MD

## 2020-01-29 ENCOUNTER — Telehealth: Payer: Self-pay | Admitting: Internal Medicine

## 2020-01-29 DIAGNOSIS — J301 Allergic rhinitis due to pollen: Secondary | ICD-10-CM | POA: Diagnosis not present

## 2020-01-29 DIAGNOSIS — J3089 Other allergic rhinitis: Secondary | ICD-10-CM | POA: Diagnosis not present

## 2020-01-29 NOTE — Progress Notes (Signed)
  Chronic Care Management   Note  01/29/2020 Name: Cassandra [name] MRN: [id number] DOB: 1946-01-12  Cassandra [name] is a 74 y.o. year old female who is a primary care patient of Isaac Bliss, Rayford Halsted, MD. I reached out to Cassandra [name] by phone today in response to a referral sent by Cassandra [name] PCP, Isaac Bliss, Rayford Halsted, MD.   Cassandra [name] was given information about Chronic Care Management services today including:  1. CCM service includes personalized support from designated clinical staff supervised by her physician, including individualized plan of care and coordination with other care providers 2. 24/7 contact phone numbers for assistance for urgent and routine care needs. 3. Service will only be billed when office clinical staff spend 20 minutes or more in a month to coordinate care. 4. Only one practitioner may furnish and bill the service in a calendar month. 5. The patient may stop CCM services at any time (effective at the end of the month) by phone call to the office staff.   Patient agreed to services and verbal consent obtained.   Follow up plan:   Carley Perdue [employer] Scheduler

## 2020-01-30 NOTE — Progress Notes (Signed)
[facility] [address] [place] Phone: [phone] Subjective:   I Cassandra [name] am serving as a Education administrator for Dr. Hulan Saas.  This visit occurred during the SARS-CoV-2 public health emergency.  Safety protocols were in place, including screening questions prior to the visit, additional usage of staff PPE, and extensive cleaning of exam room while observing appropriate contact time as indicated for disinfecting solutions.   I'm seeing this patient by the request  of:  Isaac Bliss, Rayford Halsted, MD  CC: Low back pain  NTI:[id number]   12/12/2019 Chronic overall.  X-rays do show degenerative disc disease at L5-S1 being the main . Encouraged her to continue the gabapentin.  Discussed the possibility of formal physical therapy but patient declined at the moment.  No other changes.  Follow-up in 4 to 8 weeks total time with patient reviewing patient's previous imaging, office notes and discussing with her 32 minutes  Discussed with patient having the history of iron deficiency encourage her to do supplementation.  Patient was to continue with conservative therapy.  Could potentially be a candidate for injections if needed.  He is going to be a chronic problem.  We discussed the possibility of formal physical therapy or bracing.  Patient wants to continue with conservative therapy at this time follow-up in 2 months  Update 01/31/2020 Cassandra [name] is a 74 y.o. female coming in with complaint of left knee and lower back pain. Patient states she is no better. States it is not worse. Exercising. States that sitting for long periods of time her back is stiff. States the back catches with certain ROM.  States that the pain in the back seems to be the worse at this moment.  Patient states that it is affecting daily activity sometimes seems like sometimes has some weakness of the inguinal right leg and does not know if it is secondary to the knee and  instability or if it is actually coming from the back.  Patient feels like she is try to do everything possible at this moment and still cannot make complete breakthrough on some of the pain at the moment.    Patient did have x-rays previously that were independently visualized by me.  X-ray showed moderate to severe degenerative disc disease at L V-S I  Past Medical History:  Diagnosis Date  . Anemia   . Anxiety   . Asthma   . Back pain   . BPPV (benign paroxysmal positional vertigo)   . Chronic allergic conjunctivitis   . Depression   . Diverticulosis   . Fibromyalgia   . Gallbladder problem   . Gallstones   . GERD (gastroesophageal reflux disease)   . Hiatal hernia   . History of bladder stone   . Iron (Fe) deficiency anemia   . Joint pain   . Lump in female breast   . Mild persistent asthma    followed by pcp--- dr r. sharma ([facility] allergy/asthma)  . OA (osteoarthritis)    knees, hands  . OAB (overactive bladder)   . OSA (obstructive sleep apnea)    per pt has not used cpap several years  . Pancreatic cyst   . PONV (postoperative nausea and vomiting)   . RLS (restless legs syndrome)   . Seasonal and perennial allergic rhinitis   . SUI (stress urinary incontinence, female)   . Swallowing difficulty   . Vitamin B12 deficiency   . Vitamin D deficiency   . Wears  glasses    Past Surgical History:  Procedure Laterality Date  . BLADDER SUSPENSION  1990's   sling  . bladder tacking  2010   with mesh  . BREAST LUMPECTOMY Left 1985   Benign cyst  . BREAST REDUCTION SURGERY Bilateral 2003 approx.  . CORNEA LESION EXCISION Right 2005  approx.  . CYSTOSCOPY N/A 07/12/2016   Procedure: CYSTOSCOPY, VAGINOSCOPY, EXAM UNDER ANESTHESIA, STONE LITHOTRIPSY,;  Surgeon: Cleon Gustin, MD;  Location: [facility];  Service: Urology;  Laterality: N/A;  . CYSTOSCOPY N/A 10/11/2016   Procedure: CYSTOSCOPY;  Surgeon: Cleon Gustin, MD;  Location: [facility];  Service: Urology;  Laterality: N/A;  . CYSTOSCOPY WITH LITHOLAPAXY N/A 10/11/2016   Procedure: CYSTOSCOPY WITH LITHOLAPAXY/ EXCISION OF MESH;  Surgeon: Cleon Gustin, MD;  Location: [facility];  Service: Urology;  Laterality: N/A;  . CYSTOSCOPY WITH LITHOLAPAXY N/A 01/22/2019   Procedure: CYSTOSCOPY WITH LITHOLAPAXY;  Surgeon: Cleon Gustin, MD;  Location: [facility];  Service: Urology;  Laterality: N/A;  1 HR  . HOLMIUM LASER APPLICATION  1/82/9937   Procedure: HOLMIUM LASER APPLICATION;  Surgeon: Cleon Gustin, MD;  Location: [facility];  Service: Urology;;  . HOLMIUM LASER APPLICATION N/A 1/69/6789   Procedure: HOLMIUM LASER APPLICATION;  Surgeon: Cleon Gustin, MD;  Location: [facility];  Service: Urology;  Laterality: N/A;  . RIGHT KNEE ARTHROSCOPY  2013  . TOTAL KNEE ARTHROPLASTY Right 09/12/2012   Procedure: RIGHT TOTAL KNEE ARTHROPLASTY;  Surgeon: Gearlean Alf, MD;  Location: [facility];  Service: Orthopedics;  Laterality: Right;  Marland Kitchen VAGINAL HYSTERECTOMY  1984   Social History   Socioeconomic History  . Marital status: Widowed    Spouse name: Not on file  . Number of children: Not on file  . Years of education: Not on file  . Highest education level: Not on file  Occupational History  . Occupation: Biochemist, clinical Retired  Tobacco Use  . Smoking status: Never Smoker  . Smokeless tobacco: Never Used  Vaping Use  . Vaping Use: Never used  Substance and Sexual Activity  . Alcohol use: Yes    Comment: rare  . Drug use: Never  . Sexual activity: Not on file  Other Topics Concern  . Not on file  Social History Narrative  . Not on file   Social Determinants of Health   Financial Resource Strain:   . Difficulty of Paying Living Expenses: Not on file  Food Insecurity:   . Worried About Charity fundraiser in the Last Year: Not on file  . Ran Out of Food in the Last  Year: Not on file  Transportation Needs:   . Lack of Transportation (Medical): Not on file  . Lack of Transportation (Non-Medical): Not on file  Physical Activity:   . Days of Exercise per Week: Not on file  . Minutes of Exercise per Session: Not on file  Stress:   . Feeling of Stress : Not on file  Social Connections:   . Frequency of Communication with Friends and Family: Not on file  . Frequency of Social Gatherings with Friends and Family: Not on file  . Attends Religious Services: Not on file  . Active Member of Clubs or Organizations: Not on file  . Attends Archivist Meetings: Not on file  . Marital Status: Not on file   Allergies  Allergen Reactions  . Shellfish Allergy Hives  .  Cephalosporins Hives  . Hydrochlorothiazide Hives  . Penicillins Hives  . Sulfa Antibiotics Hives   Family History  Problem Relation Age of Onset  . Kidney cancer Mother   . Diabetes Mother   . Obesity Mother   . Alcoholism Father   . Colon cancer Neg Hx   . Inflammatory bowel disease Neg Hx   . Liver disease Neg Hx   . Pancreatic cancer Neg Hx   . Stomach cancer Neg Hx   . Rectal cancer Neg Hx     Current Outpatient Medications (Endocrine & Metabolic):  .  metFORMIN (GLUCOPHAGE) 500 MG tablet, Take 1 tablet (500 mg total) by mouth 2 (two) times daily with a meal.  Current Outpatient Medications (Cardiovascular):  Marland Kitchen  EPINEPHrine 0.3 mg/0.3 mL IJ SOAJ injection, Inject 0.3 mg into the muscle as needed for anaphylaxis. Marland Kitchen  spironolactone (ALDACTONE) 25 MG tablet, TAKE 1 TABLET BY MOUTH EVERY DAY  Current Outpatient Medications (Respiratory):  Marland Kitchen  BREO ELLIPTA 200-25 MCG/INH AEPB, Inhale 1 puff into the lungs daily.  Marland Kitchen  levocetirizine (XYZAL) 5 MG tablet, Take 5 mg by mouth every evening.     Current Outpatient Medications (Other):  Marland Kitchen  Cholecalciferol (VITAMIN D-3) 125 MCG (5000 UT) TABS, Take 1 capsule by mouth daily.  Marland Kitchen  gabapentin (NEURONTIN) 100 MG capsule, TAKE 2  CAPSULES (200 MG TOTAL) BY MOUTH AT BEDTIME. Marland Kitchen  Misc Natural Products (TART CHERRY ADVANCED) CAPS, Take 1 capsule by mouth. 1200mg  capsule .  omeprazole (PRILOSEC) 20 MG capsule, Take 1 capsule (20 mg total) by mouth 2 (two) times daily before a meal. (Patient taking differently: Take 20 mg by mouth daily. ) .  psyllium (REGULOID) 0.52 g capsule, Take 0.52 g by mouth daily.   Reviewed prior external information including notes and imaging from  primary care provider As well as notes that were available from care everywhere and other healthcare systems.  Past medical history, social, surgical and family history all reviewed in electronic medical record.  No pertanent information unless stated regarding to the chief complaint.   Review of Systems:  No headache, visual changes, nausea, vomiting, diarrhea, constipation, dizziness, abdominal pain, skin rash, fevers, chills, night sweats, weight loss, swollen lymph nodes, body aches, joint swelling, chest pain, shortness of breath, mood changes. POSITIVE muscle aches  Objective  Blood pressure 130/82, pulse 66, height 5\' 5"  (1.651 m), weight 193 lb (87.5 kg), SpO2 98 %.   General: No apparent distress alert and oriented x3 mood and affect normal, dressed appropriately.  HEENT: Pupils equal, extraocular movements intact  Respiratory: Patient's speak in full sentences and does not appear short of breath  Cardiovascular: No lower extremity edema, non tender, no erythema  Neuro: Cranial nerves II through XII are intact, neurovascularly intact in all extremities with 2+ DTRs and 2+ pulses.  Gait normal with good balance and coordination.  MSK: Right knee shows replacement, audible popping noted, left knee does have some arthritic changes, instability with valgus and varus force noted.  Lacks last 5 degrees of flexion.  Low back exam severe loss of lordosis, tightness noted in the paraspinal musculature.  Tightness noted with FABER test.   Neurovascularly intact distally.  Deep tendon reflexes are intact.  Worsening pain noted with extension of the back    Impression and Recommendations:     The above documentation has been reviewed and is accurate and complete Lyndal Pulley, DO       Note: This dictation was prepared with  Dragon dictation along with smaller Company secretary. Any transcriptional errors that result from this process are unintentional.

## 2020-01-31 ENCOUNTER — Ambulatory Visit (INDEPENDENT_AMBULATORY_CARE_PROVIDER_SITE_OTHER): Payer: PPO

## 2020-01-31 ENCOUNTER — Other Ambulatory Visit: Payer: Self-pay

## 2020-01-31 ENCOUNTER — Ambulatory Visit: Payer: PPO | Admitting: Family Medicine

## 2020-01-31 ENCOUNTER — Encounter: Payer: Self-pay | Admitting: Family Medicine

## 2020-01-31 VITALS — BP 130/82 | HR 66 | Ht 65.0 in | Wt 193.0 lb

## 2020-01-31 DIAGNOSIS — G8929 Other chronic pain: Secondary | ICD-10-CM

## 2020-01-31 DIAGNOSIS — M545 Low back pain: Secondary | ICD-10-CM | POA: Diagnosis not present

## 2020-01-31 DIAGNOSIS — M549 Dorsalgia, unspecified: Secondary | ICD-10-CM | POA: Diagnosis not present

## 2020-01-31 DIAGNOSIS — M25562 Pain in left knee: Secondary | ICD-10-CM | POA: Diagnosis not present

## 2020-01-31 DIAGNOSIS — M25561 Pain in right knee: Secondary | ICD-10-CM

## 2020-01-31 DIAGNOSIS — M1712 Unilateral primary osteoarthritis, left knee: Secondary | ICD-10-CM | POA: Diagnosis not present

## 2020-01-31 NOTE — Patient Instructions (Signed)
MRI lumbar Knee xray today  Keep doing everything else I think you are stronger even though weight hasn't changed I will write you about MRI to discuss next steps

## 2020-01-31 NOTE — Assessment & Plan Note (Signed)
Patient has degenerative disc disease mostly at the L5-S1 area and being moderate to severe.  Patient continues to have radicular symptoms and may be some intermittent weakness noted of the right lower extremity.  X-rays of the knee replacement today and the patient has failed all other conservative therapy and with the history of the pancreatic cyst I do feel that an MRI of the back should be relatively well as well.  We will get the MRI patient would be a candidate for possible epidurals.  Follow-up again in 4 to 8 weeks

## 2020-01-31 NOTE — Assessment & Plan Note (Signed)
We will continue the gabapentin at this point.  Could consider the possibility of Cymbalta if MRI is doing relatively better

## 2020-02-06 DIAGNOSIS — J3081 Allergic rhinitis due to animal (cat) (dog) hair and dander: Secondary | ICD-10-CM | POA: Diagnosis not present

## 2020-02-06 DIAGNOSIS — J3089 Other allergic rhinitis: Secondary | ICD-10-CM | POA: Diagnosis not present

## 2020-02-12 DIAGNOSIS — J301 Allergic rhinitis due to pollen: Secondary | ICD-10-CM | POA: Diagnosis not present

## 2020-02-12 DIAGNOSIS — J3089 Other allergic rhinitis: Secondary | ICD-10-CM | POA: Diagnosis not present

## 2020-02-12 DIAGNOSIS — J3081 Allergic rhinitis due to animal (cat) (dog) hair and dander: Secondary | ICD-10-CM | POA: Diagnosis not present

## 2020-02-18 ENCOUNTER — Encounter (INDEPENDENT_AMBULATORY_CARE_PROVIDER_SITE_OTHER): Payer: Self-pay | Admitting: Family Medicine

## 2020-02-18 ENCOUNTER — Other Ambulatory Visit: Payer: Self-pay

## 2020-02-18 ENCOUNTER — Ambulatory Visit (INDEPENDENT_AMBULATORY_CARE_PROVIDER_SITE_OTHER): Payer: PPO | Admitting: Family Medicine

## 2020-02-18 VITALS — BP 105/67 | HR 69 | Temp 98.4°F | Ht 65.0 in | Wt 192.0 lb

## 2020-02-18 DIAGNOSIS — E669 Obesity, unspecified: Secondary | ICD-10-CM

## 2020-02-18 DIAGNOSIS — R7303 Prediabetes: Secondary | ICD-10-CM | POA: Diagnosis not present

## 2020-02-18 DIAGNOSIS — Z6832 Body mass index (BMI) 32.0-32.9, adult: Secondary | ICD-10-CM | POA: Diagnosis not present

## 2020-02-18 MED ORDER — METFORMIN HCL 500 MG PO TABS: 500.0000 mg | ORAL_TABLET | Freq: Two times a day (BID) | ORAL | 1 refills | Status: DC

## 2020-02-19 DIAGNOSIS — J3089 Other allergic rhinitis: Secondary | ICD-10-CM | POA: Diagnosis not present

## 2020-02-19 DIAGNOSIS — J301 Allergic rhinitis due to pollen: Secondary | ICD-10-CM | POA: Diagnosis not present

## 2020-02-19 NOTE — Progress Notes (Signed)
Chief Complaint:   OBESITY Cassandra [name] is here to discuss her progress with her obesity treatment plan along with follow-up of her obesity related diagnoses. Cassandra [name] is on keeping a food journal and adhering to recommended goals of 1200 calories and 70+ grams of protein daily and states she is following her eating plan approximately 85-90% of the time. [name] states she is walking for 40 minutes 7 times per week.  Today's visit was #: 6 Starting weight: 193 lbs Starting date: 11/13/2019 Today's weight: 182 lbs Today's date: 02/18/2020 Total lbs lost to date: 1 Total lbs lost since last in-office visit: 1  Interim History: Cassandra [name] continues to struggle with weight loss. She feels she isn't meeting her calorie goals, but she is working on increasing protein. She did some traveling and was able to lose 1 lb.  Subjective:   1. Pre-diabetes Cassandra [name] is stable on metformin, and she states her hunger is controlled.  Assessment/Plan:   1. Pre-diabetes Cassandra [name] will continue to work on weight loss, exercise, and decreasing simple carbohydrates to help decrease the risk of diabetes. We will refill metformin for 1 month, and we will recheck labs at her next visit.  - metFORMIN (GLUCOPHAGE) 500 MG tablet; Take 1 tablet (500 mg total) by mouth 2 (two) times daily with a meal.  Dispense: 60 tablet; Refill: 1  2. Class 1 obesity with serious comorbidity and body mass index (BMI) of 32.0 to 32.9 in adult, unspecified obesity type [name] is currently in the action stage of change. As such, her goal is to continue with weight loss efforts. She has agreed to keeping a food journal and adhering to recommended goals of 1000-1200 calories and 70+ grams of protein daily.   Exercise goals: As is.  Behavioral modification strategies: increasing lean protein intake.  Cassandra [name] has agreed to follow-up with our clinic in 3 weeks. She was informed of the importance of frequent follow-up visits to maximize her success with  intensive lifestyle modifications for her multiple health conditions.   Objective:   Blood pressure 105/67, pulse 69, temperature 98.4 F (36.9 C), height 5\' 5"  (1.651 m), weight 192 lb (87.1 kg), SpO2 98 %. Body mass index is 31.95 kg/m.  General: Cooperative, alert, well developed, in no acute distress. HEENT: Conjunctivae and lids unremarkable. Cardiovascular: Regular rhythm.  Lungs: Normal work of breathing. Neurologic: No focal deficits.   Lab Results  Component Value Date   CREATININE 0.81 11/13/2019   BUN 12 11/13/2019   NA 139 11/13/2019   K 4.8 11/13/2019   CL 100 11/13/2019   CO2 24 11/13/2019   Lab Results  Component Value Date   ALT 9 11/13/2019   AST 23 11/13/2019   ALKPHOS 122 (H) 11/13/2019   BILITOT 0.4 11/13/2019   Lab Results  Component Value Date   HGBA1C 5.7 (H) 11/13/2019   HGBA1C 5.4 08/29/2019   HGBA1C 6.1 01/17/2019   HGBA1C 4.9 01/06/2007   Lab Results  Component Value Date   INSULIN 30.1 (H) 11/13/2019   Lab Results  Component Value Date   TSH 1.680 11/13/2019   Lab Results  Component Value Date   CHOL 183 11/13/2019   HDL 61 11/13/2019   LDLCALC 104 (H) 11/13/2019   TRIG 99 11/13/2019   CHOLHDL 3 01/17/2019   Lab Results  Component Value Date   WBC 7.7 11/13/2019   HGB 14.5 11/13/2019   HCT 47.4 (H) 11/13/2019   MCV 86 11/13/2019   PLT 283 11/13/2019  Lab Results  Component Value Date   IRON 66 11/13/2019   TIBC 465 (H) 11/13/2019   FERRITIN 30 11/13/2019    Obesity Behavioral Intervention:   Approximately 15 minutes were spent on the discussion below.  ASK: We discussed the diagnosis of obesity with [name] today and Cortlyn agreed to give [place] permission to discuss obesity behavioral modification therapy today.  ASSESS: Cassandra [name] has the diagnosis of obesity and her BMI today is 31.95. Cassandra [name] is in the action stage of change.   ADVISE: Dlynn was educated on the multiple health risks of obesity as well as the benefit of  weight loss to improve her health. She was advised of the need for long term treatment and the importance of lifestyle modifications to improve her current health and to decrease her risk of future health problems.  AGREE: Multiple dietary modification options and treatment options were discussed and Cassandra [name] agreed to follow the recommendations documented in the above note.  ARRANGE: Robert was educated on the importance of frequent visits to treat obesity as outlined per CMS and USPSTF guidelines and agreed to schedule her next follow up appointment today.  Attestation Statements:   Reviewed by clinician on day of visit: allergies, medications, problem list, medical history, surgical history, family history, social history, and previous encounter notes.   I, Trixie Dredge, am acting as transcriptionist for Dennard Nip, MD.  I have reviewed the above documentation for accuracy and completeness, and I agree with the above. -  Dennard Nip, MD

## 2020-02-20 ENCOUNTER — Other Ambulatory Visit (INDEPENDENT_AMBULATORY_CARE_PROVIDER_SITE_OTHER): Payer: Self-pay | Admitting: Family Medicine

## 2020-02-20 DIAGNOSIS — R7303 Prediabetes: Secondary | ICD-10-CM

## 2020-02-26 DIAGNOSIS — J301 Allergic rhinitis due to pollen: Secondary | ICD-10-CM | POA: Diagnosis not present

## 2020-02-26 DIAGNOSIS — J3089 Other allergic rhinitis: Secondary | ICD-10-CM | POA: Diagnosis not present

## 2020-02-27 ENCOUNTER — Ambulatory Visit
Admission: RE | Admit: 2020-02-27 | Discharge: 2020-02-27 | Disposition: A | Discharge status: Home / Self Care | Payer: PPO | Source: Ambulatory Visit | Attending: Family Medicine | Admitting: Family Medicine

## 2020-02-27 ENCOUNTER — Other Ambulatory Visit: Payer: Self-pay

## 2020-02-27 DIAGNOSIS — M48061 Spinal stenosis, lumbar region without neurogenic claudication: Secondary | ICD-10-CM | POA: Diagnosis not present

## 2020-02-27 DIAGNOSIS — G8929 Other chronic pain: Secondary | ICD-10-CM

## 2020-03-03 ENCOUNTER — Encounter: Payer: Self-pay | Admitting: Family Medicine

## 2020-03-03 ENCOUNTER — Other Ambulatory Visit: Payer: Self-pay

## 2020-03-03 ENCOUNTER — Ambulatory Visit (INDEPENDENT_AMBULATORY_CARE_PROVIDER_SITE_OTHER): Payer: PPO | Admitting: Family Medicine

## 2020-03-03 VITALS — BP 110/74 | HR 76 | Ht 65.0 in | Wt 199.0 lb

## 2020-03-03 DIAGNOSIS — G8929 Other chronic pain: Secondary | ICD-10-CM

## 2020-03-03 DIAGNOSIS — M545 Low back pain, unspecified: Secondary | ICD-10-CM | POA: Diagnosis not present

## 2020-03-03 DIAGNOSIS — Z6832 Body mass index (BMI) 32.0-32.9, adult: Secondary | ICD-10-CM

## 2020-03-03 DIAGNOSIS — M1712 Unilateral primary osteoarthritis, left knee: Secondary | ICD-10-CM | POA: Diagnosis not present

## 2020-03-03 DIAGNOSIS — E6609 Other obesity due to excess calories: Secondary | ICD-10-CM

## 2020-03-03 NOTE — Patient Instructions (Signed)
Good to see you Knee injection today Right sided L5 nerve root they will call you Contiue to work on the weight Write me after your next visit with Cassandra [name] is your friend See me again in 4 weeks after nerve root injection

## 2020-03-03 NOTE — Assessment & Plan Note (Signed)
Working with healthy weight and wellness.  No significant weight loss over the course of time yet.  Patient is frustrated but hopefully we will see results in.

## 2020-03-03 NOTE — Assessment & Plan Note (Signed)
Chronic problem with worsening exacerbation.  Discussed different treatment options and decided to do an injection today discussed icing regimen and home exercises.  Patient is encouraged to try to brace whenever possible.  Follow-up with me again in 4 to 8 weeks.

## 2020-03-03 NOTE — Assessment & Plan Note (Signed)
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  Patient will have a nerve root injection in hopes that patient responds well.  Continue gabapentin, follow-up 4 weeks after the injection

## 2020-03-03 NOTE — Progress Notes (Signed)
[place] Phone: [phone] Subjective:   [name], am serving as a scribe for Dr. Hulan Saas. This visit occurred during the SARS-CoV-2 public health emergency.  Safety protocols were in place, including screening questions prior to the visit, additional usage of staff PPE, and extensive cleaning of exam room while observing appropriate contact time as indicated for disinfecting solutions.  I'm seeing this patient by the request  of:  Isaac Bliss, Rayford Halsted, MD  CC: Low back pain follow-up  BMW:[id number]   01/31/2020 Patient has degenerative disc disease mostly at the L5-S1 area and being moderate to severe.  Patient continues to have radicular symptoms and may be some intermittent weakness noted of the right lower extremity.  X-rays of the knee replacement today and the patient has failed all other conservative therapy and with the history of the pancreatic cyst I do feel that an MRI of the back should be relatively well as well.  We will get the MRI patient would be a candidate for possible epidurals.  Follow-up again in 4 to 8 weeks  Update 03/03/2020 NALY SCHWANZ is a 74 y.o. female coming in with complaint of low back pain. Patient states that she is in pain today in both knees and back. Does pick up a lot of limps in her yard and pain will last 2 days after doing that forward bending. After sitting for a long period of time she will have pinching in her back that does not allow her to stand up without pain.   Patient is also going to weight management and is not losing weight. Patient is frustrated with her progress.   Also having left knee pain over medial aspect. Patient has increased walking from 20 minutes to 40 minutes. Uses  Voltaren gel 2x day.    MRI 02/27/2020 IMPRESSION: 1. L5-S1 moderate right and mild left neural foraminal stenosis. 2. L4-L5 right subarticular disc protrusion in close  proximity to the descending right L5 nerve root in the lateral recess. Correlate for right L5 radiculopathy.     Patient did have an MRI of the lumbar spine on February 27, 2020.  This was independently visualized by me.  Patient does have an L5-S1 foraminal stenosis with a right-sided L5 nerve root impingement noted.  Past Medical History:  Diagnosis Date  . Anemia   . Anxiety   . Asthma   . Back pain   . BPPV (benign paroxysmal positional vertigo)   . Chronic allergic conjunctivitis   . Depression   . Diverticulosis   . Fibromyalgia   . Gallbladder problem   . Gallstones   . GERD (gastroesophageal reflux disease)   . Hiatal hernia   . History of bladder stone   . Iron (Fe) deficiency anemia   . Joint pain   . Lump in female breast   . Mild persistent asthma    followed by pcp--- dr r. sharma ([place] allergy/asthma)  . OA (osteoarthritis)    knees, hands  . OAB (overactive bladder)   . OSA (obstructive sleep apnea)    per pt has not used cpap several years  . Pancreatic cyst   . PONV (postoperative nausea and vomiting)   . RLS (restless legs syndrome)   . Seasonal and perennial allergic rhinitis   . SUI (stress urinary incontinence, female)   . Swallowing difficulty   . Vitamin B12 deficiency   . Vitamin D deficiency   .  Wears glasses    Past Surgical History:  Procedure Laterality Date  . BLADDER SUSPENSION  1990's   sling  . bladder tacking  2010   with mesh  . BREAST LUMPECTOMY Left 1985   Benign cyst  . BREAST REDUCTION SURGERY Bilateral 2003 approx.  . CORNEA LESION EXCISION Right 2005  approx.  . CYSTOSCOPY N/A 07/12/2016   Procedure: CYSTOSCOPY, VAGINOSCOPY, EXAM UNDER ANESTHESIA, STONE LITHOTRIPSY,;  Surgeon: Cleon Gustin, MD;  Location: [facility];  Service: Urology;  Laterality: N/A;  . CYSTOSCOPY N/A 10/11/2016   Procedure: CYSTOSCOPY;  Surgeon: Cleon Gustin, MD;  Location: [facility];  Service:  Urology;  Laterality: N/A;  . CYSTOSCOPY WITH LITHOLAPAXY N/A 10/11/2016   Procedure: CYSTOSCOPY WITH LITHOLAPAXY/ EXCISION OF MESH;  Surgeon: Cleon Gustin, MD;  Location: [facility];  Service: Urology;  Laterality: N/A;  . CYSTOSCOPY WITH LITHOLAPAXY N/A 01/22/2019   Procedure: CYSTOSCOPY WITH LITHOLAPAXY;  Surgeon: Cleon Gustin, MD;  Location: [facility];  Service: Urology;  Laterality: N/A;  1 HR  . HOLMIUM LASER APPLICATION  0/63/0160   Procedure: HOLMIUM LASER APPLICATION;  Surgeon: Cleon Gustin, MD;  Location: [facility];  Service: Urology;;  . HOLMIUM LASER APPLICATION N/A 06/08/3233   Procedure: HOLMIUM LASER APPLICATION;  Surgeon: Cleon Gustin, MD;  Location: [facility];  Service: Urology;  Laterality: N/A;  . RIGHT KNEE ARTHROSCOPY  2013  . TOTAL KNEE ARTHROPLASTY Right 09/12/2012   Procedure: RIGHT TOTAL KNEE ARTHROPLASTY;  Surgeon: Gearlean Alf, MD;  Location: [facility];  Service: Orthopedics;  Laterality: Right;  Marland Kitchen VAGINAL HYSTERECTOMY  1984   Social History   Socioeconomic History  . Marital status: Widowed    Spouse name: Not on file  . Number of children: Not on file  . Years of education: Not on file  . Highest education level: Not on file  Occupational History  . Occupation: Biochemist, clinical Retired  Tobacco Use  . Smoking status: Never Smoker  . Smokeless tobacco: Never Used  Vaping Use  . Vaping Use: Never used  Substance and Sexual Activity  . Alcohol use: Yes    Comment: rare  . Drug use: Never  . Sexual activity: Not on file  Other Topics Concern  . Not on file  Social History Narrative  . Not on file   Social Determinants of Health   Financial Resource Strain:   . Difficulty of Paying Living Expenses: Not on file  Food Insecurity:   . Worried About Charity fundraiser in the Last Year: Not on file  . Ran Out of Food in the Last Year: Not on file    Transportation Needs:   . Lack of Transportation (Medical): Not on file  . Lack of Transportation (Non-Medical): Not on file  Physical Activity:   . Days of Exercise per Week: Not on file  . Minutes of Exercise per Session: Not on file  Stress:   . Feeling of Stress : Not on file  Social Connections:   . Frequency of Communication with Friends and Family: Not on file  . Frequency of Social Gatherings with Friends and Family: Not on file  . Attends Religious Services: Not on file  . Active Member of Clubs or Organizations: Not on file  . Attends Archivist Meetings: Not on file  . Marital Status: Not on file   Allergies  Allergen Reactions  . Shellfish  Allergy Hives  . Cephalosporins Hives  . Hydrochlorothiazide Hives  . Penicillins Hives  . Sulfa Antibiotics Hives   Family History  Problem Relation Age of Onset  . Kidney cancer Mother   . Diabetes Mother   . Obesity Mother   . Alcoholism Father   . Colon cancer Neg Hx   . Inflammatory bowel disease Neg Hx   . Liver disease Neg Hx   . Pancreatic cancer Neg Hx   . Stomach cancer Neg Hx   . Rectal cancer Neg Hx     Current Outpatient Medications (Endocrine & Metabolic):  .  metFORMIN (GLUCOPHAGE) 500 MG tablet, Take 1 tablet (500 mg total) by mouth 2 (two) times daily with a meal.  Current Outpatient Medications (Cardiovascular):  Marland Kitchen  EPINEPHrine 0.3 mg/0.3 mL IJ SOAJ injection, Inject 0.3 mg into the muscle as needed for anaphylaxis. Marland Kitchen  spironolactone (ALDACTONE) 25 MG tablet, TAKE 1 TABLET BY MOUTH EVERY DAY  Current Outpatient Medications (Respiratory):  Marland Kitchen  BREO ELLIPTA 200-25 MCG/INH AEPB, Inhale 1 puff into the lungs daily.  .  fluticasone (FLONASE) 50 MCG/ACT nasal spray, Place into both nostrils. Marland Kitchen  levocetirizine (XYZAL) 5 MG tablet, Take 5 mg by mouth every evening.     Current Outpatient Medications (Other):  Marland Kitchen  Cholecalciferol (VITAMIN D-3) 125 MCG (5000 UT) TABS, Take 1 capsule by mouth daily.   Marland Kitchen  gabapentin (NEURONTIN) 100 MG capsule, TAKE 2 CAPSULES (200 MG TOTAL) BY MOUTH AT BEDTIME. Marland Kitchen  Misc Natural Products (TART CHERRY ADVANCED) CAPS, Take 1 capsule by mouth. 1200mg  capsule .  omeprazole (PRILOSEC) 20 MG capsule, Take 1 capsule (20 mg total) by mouth 2 (two) times daily before a meal. (Patient taking differently: Take 20 mg by mouth daily. ) .  psyllium (REGULOID) 0.52 g capsule, Take 0.52 g by mouth daily.   Reviewed prior external information including notes and imaging from  primary care provider As well as notes that were available from care everywhere and other healthcare systems.  Past medical history, social, surgical and family history all reviewed in electronic medical record.  No pertanent information unless stated regarding to the chief complaint.   Review of Systems:  No headache, visual changes, nausea, vomiting, diarrhea, constipation, dizziness, abdominal pain, skin rash, fevers, chills, night sweats, weight loss, swollen lymph nodes, body aches, joint swelling, chest pain, shortness of breath, mood changes. POSITIVE muscle aches  Objective  Blood pressure 110/74, pulse 76, height 5\' 5"  (1.651 m), weight 199 lb (90.3 kg), SpO2 96 %.   General: No apparent distress alert and oriented x3 mood and affect normal, dressed appropriately.  Moderately overweight HEENT: Pupils equal, extraocular movements intact  Respiratory: Patient's speak in full sentences and does not appear short of breath  Cardiovascular: No lower extremity edema, non tender, no erythema   Gait antalgic favoring the left knee.  Patient does have arthritic changes of the left knee with instability noted with valgus and varus force right knee shows replacement. Back exam does show that patient does have mild loss of lordosis, worsening pain with extension as well as straight leg test on the right side with radicular symptoms in the L5 distribution.  Very mild weakness of the right lower extremity  compared to the contralateral side but difficult to assess with patient compensating for her left knee.  After informed written and verbal consent, patient was seated on exam table. Left knee was prepped with alcohol swab and utilizing anterolateral approach, patient's left knee space  was injected with 4:1  marcaine 0.5%: Kenalog 40mg /dL. Patient tolerated the procedure well without immediate complications.   Impression and Recommendations:     The above documentation has been reviewed and is accurate and complete Lyndal Pulley, DO

## 2020-03-06 ENCOUNTER — Other Ambulatory Visit: Payer: Self-pay | Admitting: Family Medicine

## 2020-03-06 DIAGNOSIS — J301 Allergic rhinitis due to pollen: Secondary | ICD-10-CM | POA: Diagnosis not present

## 2020-03-06 DIAGNOSIS — J3081 Allergic rhinitis due to animal (cat) (dog) hair and dander: Secondary | ICD-10-CM | POA: Diagnosis not present

## 2020-03-06 DIAGNOSIS — J3089 Other allergic rhinitis: Secondary | ICD-10-CM | POA: Diagnosis not present

## 2020-03-10 ENCOUNTER — Other Ambulatory Visit: Payer: PPO

## 2020-03-11 ENCOUNTER — Telehealth (INDEPENDENT_AMBULATORY_CARE_PROVIDER_SITE_OTHER): Payer: PPO | Admitting: Adult Health

## 2020-03-11 ENCOUNTER — Ambulatory Visit (INDEPENDENT_AMBULATORY_CARE_PROVIDER_SITE_OTHER): Payer: PPO | Admitting: Family Medicine

## 2020-03-11 ENCOUNTER — Other Ambulatory Visit: Payer: Self-pay

## 2020-03-11 DIAGNOSIS — J302 Other seasonal allergic rhinitis: Secondary | ICD-10-CM | POA: Diagnosis not present

## 2020-03-11 DIAGNOSIS — Z6831 Body mass index (BMI) 31.0-31.9, adult: Secondary | ICD-10-CM

## 2020-03-11 DIAGNOSIS — E669 Obesity, unspecified: Secondary | ICD-10-CM

## 2020-03-11 DIAGNOSIS — R7303 Prediabetes: Secondary | ICD-10-CM | POA: Diagnosis not present

## 2020-03-12 ENCOUNTER — Other Ambulatory Visit: Payer: PPO

## 2020-03-12 DIAGNOSIS — J301 Allergic rhinitis due to pollen: Secondary | ICD-10-CM | POA: Diagnosis not present

## 2020-03-12 DIAGNOSIS — J302 Other seasonal allergic rhinitis: Secondary | ICD-10-CM | POA: Insufficient documentation

## 2020-03-12 DIAGNOSIS — J3089 Other allergic rhinitis: Secondary | ICD-10-CM | POA: Diagnosis not present

## 2020-03-12 DIAGNOSIS — R7303 Prediabetes: Secondary | ICD-10-CM | POA: Insufficient documentation

## 2020-03-12 NOTE — Progress Notes (Signed)
TeleHealth Visit:  Due to the COVID-19 pandemic, this visit was completed with telemedicine (audio/video) technology to reduce patient and provider exposure as well as to preserve personal protective equipment.   Cassandra [name] has verbally consented to this TeleHealth visit. The patient is located at home, the provider is located at the [facility] office. The participants in this visit include the listed provider and patient. The visit was conducted today via video.  Chief Complaint: OBESITY Cassandra [name] is here to discuss her progress with her obesity treatment plan along with follow-up of her obesity related diagnoses. Cassandra [name] is keeping a food journal and adhering to recommended goals of 1000-1200 calories and 70 grams of protein and states she is following her eating plan approximately 85% of the time. Cassandra [name] states she is walking 40 minutes 7 times per week.  Today's visit was #: 7 Starting weight: 193 lbs Starting date: 11/13/2019  Interim History: Cassandra [name] has an acute sinus infection with nasal drainage, sore throat, headache, and fatigue over the last 48 hours. She received a flu vaccination on 02/26/2020 and booster of Pfizer COVID-19 vaccine on 03/06/2020. She denies fever or loss of sense of smell or taste. She tried the vegetarian meal plan for 2 days and then converted back to journaling. She estimates to consume 1000-1200 calories a day and 55-65 grams of protein a day. She has recently purchased hand and ankle weights.   Subjective:   Pre-diabetes. Cassandra [name] has a diagnosis of prediabetes based on her elevated HgA1c and was informed this puts her at greater risk of developing diabetes. She continues to work on diet and exercise to decrease her risk of diabetes. She denies nausea or hypoglycemia. 11/13/2019 blood glucose 93, A1c 5.7 with an insulin level of 30.1. Cassandra [name] is on metformin 500 mg QD, she has yet to increase to BID.  Lab Results  Component Value Date   HGBA1C 5.7  (H) 11/13/2019   Lab Results  Component Value Date   INSULIN 30.1 (H) 11/13/2019   Seasonal allergies. Cassandra [name] has experienced acute sinus infection symptoms the last 48 hours with nasal drainage, sore throat, headache, and fatigue. She denies known exposure to COVID-19. She received booster of Pfizer COVID-19 vaccine 03/06/2020.  Assessment/Plan:   Pre-diabetes. Cassandra [name] will continue to work on weight loss, exercise, and decreasing simple carbohydrates to help decrease the risk of diabetes. She will take metformin 500 mg BID as directed, (no medication refill today). Labs will be checked at her next office visit.  Seasonal allergies. Cassandra [name] was instructed to increase fluids and rest. She will continue Xyzal 5 mg daily.  Class 1 obesity with serious comorbidity and body mass index (BMI) of 31.0 to 31.9 in adult, unspecified obesity type.  Cassandra [name] is currently in the action stage of change. As such, her goal is to continue with weight loss efforts. She has agreed to keeping a food journal and adhering to recommended goals of 1200 calories and 70 grams of protein daily.   She will consistently hit 1200 calories and 70 grams of protein a day, increase exercise slowly, and increase metformin to BID as ordered.  Exercise goals: Cassandra [name] will continue walking 40 minutes 7 times per week.  Behavioral modification strategies: increasing lean protein intake, decreasing simple carbohydrates, no skipping meals, meal planning and cooking strategies, better snacking choices, planning for success and keeping a strict food journal.  Cassandra [name] has agreed to follow-up with our clinic fasting in 2 weeks. She was informed of the  importance of frequent follow-up visits to maximize her success with intensive lifestyle modifications for her multiple health conditions.  Objective:   VITALS: Per patient if applicable, see vitals. GENERAL: Alert and in no acute distress. CARDIOPULMONARY: No increased WOB. Speaking in clear  sentences.  PSYCH: Pleasant and cooperative. Speech normal rate and rhythm. Affect is appropriate. Insight and judgement are appropriate. Attention is focused, linear, and appropriate.  NEURO: Oriented as arrived to appointment on time with no prompting.   Lab Results  Component Value Date   CREATININE 0.81 11/13/2019   BUN 12 11/13/2019   NA 139 11/13/2019   K 4.8 11/13/2019   CL 100 11/13/2019   CO2 24 11/13/2019   Lab Results  Component Value Date   ALT 9 11/13/2019   AST 23 11/13/2019   ALKPHOS 122 (H) 11/13/2019   BILITOT 0.4 11/13/2019   Lab Results  Component Value Date   HGBA1C 5.7 (H) 11/13/2019   HGBA1C 5.4 08/29/2019   HGBA1C 6.1 01/17/2019   HGBA1C 4.9 01/06/2007   Lab Results  Component Value Date   INSULIN 30.1 (H) 11/13/2019   Lab Results  Component Value Date   TSH 1.680 11/13/2019   Lab Results  Component Value Date   CHOL 183 11/13/2019   HDL 61 11/13/2019   LDLCALC 104 (H) 11/13/2019   TRIG 99 11/13/2019   CHOLHDL 3 01/17/2019   Lab Results  Component Value Date   WBC 7.7 11/13/2019   HGB 14.5 11/13/2019   HCT 47.4 (H) 11/13/2019   MCV 86 11/13/2019   PLT 283 11/13/2019   Lab Results  Component Value Date   IRON 66 11/13/2019   TIBC 465 (H) 11/13/2019   FERRITIN 30 11/13/2019   Attestation Statements:   Reviewed by clinician on day of visit: allergies, medications, problem list, medical history, surgical history, family history, social history, and previous encounter notes.  Time spent on visit including pre-visit chart review and post-visit charting and care was 29 minutes.   I, Michaelene Song, am acting as Location manager for [employer], NP-C   I have reviewed the above documentation for accuracy and completeness, and I agree with the above. -  d. , NP-C

## 2020-03-19 DIAGNOSIS — J3089 Other allergic rhinitis: Secondary | ICD-10-CM | POA: Diagnosis not present

## 2020-03-19 DIAGNOSIS — J3081 Allergic rhinitis due to animal (cat) (dog) hair and dander: Secondary | ICD-10-CM | POA: Diagnosis not present

## 2020-03-19 DIAGNOSIS — J301 Allergic rhinitis due to pollen: Secondary | ICD-10-CM | POA: Diagnosis not present

## 2020-03-26 DIAGNOSIS — J301 Allergic rhinitis due to pollen: Secondary | ICD-10-CM | POA: Diagnosis not present

## 2020-03-26 DIAGNOSIS — J3081 Allergic rhinitis due to animal (cat) (dog) hair and dander: Secondary | ICD-10-CM | POA: Diagnosis not present

## 2020-03-26 DIAGNOSIS — J3089 Other allergic rhinitis: Secondary | ICD-10-CM | POA: Diagnosis not present

## 2020-03-27 ENCOUNTER — Other Ambulatory Visit: Payer: Self-pay

## 2020-03-27 ENCOUNTER — Ambulatory Visit (INDEPENDENT_AMBULATORY_CARE_PROVIDER_SITE_OTHER): Payer: PPO | Admitting: Family Medicine

## 2020-03-27 ENCOUNTER — Encounter (INDEPENDENT_AMBULATORY_CARE_PROVIDER_SITE_OTHER): Payer: Self-pay | Admitting: Family Medicine

## 2020-03-27 VITALS — BP 108/66 | HR 70 | Temp 98.0°F | Ht 65.0 in | Wt 193.0 lb

## 2020-03-27 DIAGNOSIS — Z6832 Body mass index (BMI) 32.0-32.9, adult: Secondary | ICD-10-CM

## 2020-03-27 DIAGNOSIS — R7303 Prediabetes: Secondary | ICD-10-CM

## 2020-03-27 DIAGNOSIS — E669 Obesity, unspecified: Secondary | ICD-10-CM

## 2020-03-27 MED ORDER — METFORMIN HCL 500 MG PO TABS: 500.0000 mg | ORAL_TABLET | Freq: Two times a day (BID) | ORAL | 0 refills | Status: DC

## 2020-03-29 ENCOUNTER — Other Ambulatory Visit (INDEPENDENT_AMBULATORY_CARE_PROVIDER_SITE_OTHER): Payer: Self-pay | Admitting: Family Medicine

## 2020-03-29 DIAGNOSIS — R7303 Prediabetes: Secondary | ICD-10-CM

## 2020-03-31 NOTE — Progress Notes (Signed)
Chief Complaint:   OBESITY Cassandra [name] is here to discuss her progress with her obesity treatment plan along with follow-up of her obesity related diagnoses. Cassandra [name] is on keeping a food journal and adhering to recommended goals of 1200 calories and 70 grams of protein daily and states she is following her eating plan approximately 85-90% of the time. Cassandra [name] states she is walking for 50-85 minutes 7 times per week.  Today's visit was #: 8 Starting weight: 193 lbs Starting date: 11/13/2019 Today's weight: 193 lbs Today's date: 03/27/2020 Total lbs lost to date: 0 Total lbs lost since last in-office visit: 0  Interim History: Cassandra [name] has been trying to be mindful and make better choices. She is trying to journal on and off but often is not eating enough calories.  Subjective:   1. Pre-diabetes Cassandra [name] has questions about her diagnosis, and how this affects her eating. She is frustrated with her lack of weight loss. I discussed labs with the patient today.  Assessment/Plan:   1. Pre-diabetes Cassandra [name] will continue to work on weight loss, exercise, and decreasing simple carbohydrates to help decrease the risk of diabetes. We will refill metformin for 1 month. I spent approximately 40 minutes discussing the diagnosis and how nutrition and exercise affects her health and specially weight loss.  - metFORMIN (GLUCOPHAGE) 500 MG tablet; Take 1 tablet (500 mg total) by mouth 2 (two) times daily with a meal.  Dispense: 60 tablet; Refill: 0  2. Class 1 obesity with serious comorbidity and body mass index (BMI) of 32.0 to 32.9 in adult, unspecified obesity type Cassandra [name] is currently in the action stage of change. As such, her goal is to continue with weight loss efforts. She has agreed to keeping a food journal and adhering to recommended goals of 1000-1200 calories and 75+ grams of protein daily.   Cassandra [name] was encouraged to make sure to eat at least 1,000 kcal per day, as this is dropping her RMR and stalling  her weight loss.  Exercise goals: As is.  Behavioral modification strategies: increasing lean protein intake, decreasing simple carbohydrates, no skipping meals and meal planning and cooking strategies.  Cassandra [name] has agreed to follow-up with our clinic in 3 to 4 weeks. She was informed of the importance of frequent follow-up visits to maximize her success with intensive lifestyle modifications for her multiple health conditions.   Objective:   Blood pressure 108/66, pulse 70, temperature 98 F (36.7 C), height 5\' 5"  (1.651 m), weight 193 lb (87.5 kg), SpO2 97 %. Body mass index is 32.12 kg/m.  General: Cooperative, alert, well developed, in no acute distress. HEENT: Conjunctivae and lids unremarkable. Cardiovascular: Regular rhythm.  Lungs: Normal work of breathing. Neurologic: No focal deficits.   Lab Results  Component Value Date   CREATININE 0.81 11/13/2019   BUN 12 11/13/2019   NA 139 11/13/2019   K 4.8 11/13/2019   CL 100 11/13/2019   CO2 24 11/13/2019   Lab Results  Component Value Date   ALT 9 11/13/2019   AST 23 11/13/2019   ALKPHOS 122 (H) 11/13/2019   BILITOT 0.4 11/13/2019   Lab Results  Component Value Date   HGBA1C 5.7 (H) 11/13/2019   HGBA1C 5.4 08/29/2019   HGBA1C 6.1 01/17/2019   HGBA1C 4.9 01/06/2007   Lab Results  Component Value Date   INSULIN 30.1 (H) 11/13/2019   Lab Results  Component Value Date   TSH 1.680 11/13/2019   Lab Results  Component Value Date  CHOL 183 11/13/2019   HDL 61 11/13/2019   LDLCALC 104 (H) 11/13/2019   TRIG 99 11/13/2019   CHOLHDL 3 01/17/2019   Lab Results  Component Value Date   WBC 7.7 11/13/2019   HGB 14.5 11/13/2019   HCT 47.4 (H) 11/13/2019   MCV 86 11/13/2019   PLT 283 11/13/2019   Lab Results  Component Value Date   IRON 66 11/13/2019   TIBC 465 (H) 11/13/2019   FERRITIN 30 11/13/2019   Attestation Statements:   Reviewed by clinician on day of visit: allergies, medications, problem list,  medical history, surgical history, family history, social history, and previous encounter notes.  Time spent on visit including pre-visit chart review and post-visit care and charting was 40 minutes.    I, Trixie Dredge, am acting as transcriptionist for Dennard Nip, MD.  I have reviewed the above documentation for accuracy and completeness, and I agree with the above. -  Dennard Nip, MD

## 2020-03-31 NOTE — Telephone Encounter (Signed)
Last OV with Dr. Beasley 

## 2020-04-01 DIAGNOSIS — J3089 Other allergic rhinitis: Secondary | ICD-10-CM | POA: Diagnosis not present

## 2020-04-01 DIAGNOSIS — J301 Allergic rhinitis due to pollen: Secondary | ICD-10-CM | POA: Diagnosis not present

## 2020-04-03 ENCOUNTER — Encounter: Payer: Self-pay | Admitting: Internal Medicine

## 2020-04-07 DIAGNOSIS — J301 Allergic rhinitis due to pollen: Secondary | ICD-10-CM | POA: Diagnosis not present

## 2020-04-07 DIAGNOSIS — J3089 Other allergic rhinitis: Secondary | ICD-10-CM | POA: Diagnosis not present

## 2020-04-08 DIAGNOSIS — J301 Allergic rhinitis due to pollen: Secondary | ICD-10-CM | POA: Diagnosis not present

## 2020-04-08 DIAGNOSIS — J3089 Other allergic rhinitis: Secondary | ICD-10-CM | POA: Diagnosis not present

## 2020-04-09 ENCOUNTER — Ambulatory Visit
Admission: RE | Admit: 2020-04-09 | Discharge: 2020-04-09 | Disposition: A | Discharge status: Home / Self Care | Payer: PPO | Source: Ambulatory Visit | Attending: Family Medicine | Admitting: Family Medicine

## 2020-04-09 DIAGNOSIS — M545 Low back pain, unspecified: Secondary | ICD-10-CM

## 2020-04-09 DIAGNOSIS — G8929 Other chronic pain: Secondary | ICD-10-CM

## 2020-04-09 DIAGNOSIS — M47817 Spondylosis without myelopathy or radiculopathy, lumbosacral region: Secondary | ICD-10-CM | POA: Diagnosis not present

## 2020-04-09 MED ORDER — IOPAMIDOL (ISOVUE-M 200) INJECTION 41%: 1.0000 mL | Freq: Once | INTRAMUSCULAR | Status: DC

## 2020-04-09 MED ORDER — METHYLPREDNISOLONE ACETATE 40 MG/ML INJ SUSP (RADIOLOG: 120.0000 mg | Freq: Once | INTRAMUSCULAR | Status: DC

## 2020-04-09 NOTE — Discharge Instructions (Signed)

## 2020-04-15 DIAGNOSIS — J301 Allergic rhinitis due to pollen: Secondary | ICD-10-CM | POA: Diagnosis not present

## 2020-04-15 DIAGNOSIS — J3089 Other allergic rhinitis: Secondary | ICD-10-CM | POA: Diagnosis not present

## 2020-04-17 ENCOUNTER — Encounter (INDEPENDENT_AMBULATORY_CARE_PROVIDER_SITE_OTHER): Payer: Self-pay | Admitting: Family Medicine

## 2020-04-17 ENCOUNTER — Ambulatory Visit (INDEPENDENT_AMBULATORY_CARE_PROVIDER_SITE_OTHER): Payer: PPO | Admitting: Family Medicine

## 2020-04-17 ENCOUNTER — Other Ambulatory Visit: Payer: Self-pay

## 2020-04-17 VITALS — BP 117/68 | HR 64 | Temp 97.7°F | Ht 65.0 in | Wt 191.0 lb

## 2020-04-17 DIAGNOSIS — E669 Obesity, unspecified: Secondary | ICD-10-CM

## 2020-04-17 DIAGNOSIS — E785 Hyperlipidemia, unspecified: Secondary | ICD-10-CM

## 2020-04-17 DIAGNOSIS — Z6831 Body mass index (BMI) 31.0-31.9, adult: Secondary | ICD-10-CM

## 2020-04-17 DIAGNOSIS — E559 Vitamin D deficiency, unspecified: Secondary | ICD-10-CM | POA: Diagnosis not present

## 2020-04-17 DIAGNOSIS — R7303 Prediabetes: Secondary | ICD-10-CM | POA: Diagnosis not present

## 2020-04-18 LAB — COMPREHENSIVE METABOLIC PANEL
ALT: 6 IU/L (ref 0–32)
AST: 14 IU/L (ref 0–40)
Albumin/Globulin Ratio: 1.6 (ref 1.2–2.2)
Albumin: 4.6 g/dL (ref 3.7–4.7)
Alkaline Phosphatase: 108 IU/L (ref 44–121)
BUN/Creatinine Ratio: 27 (ref 12–28)
BUN: 21 mg/dL (ref 8–27)
Bilirubin Total: 0.4 mg/dL (ref 0.0–1.2)
CO2: 25 mmol/L (ref 20–29)
Calcium: 9.9 mg/dL (ref 8.7–10.3)
Chloride: 100 mmol/L (ref 96–106)
Creatinine, Ser: 0.79 mg/dL (ref 0.57–1.00)
GFR calc Af Amer: 86 mL/min/{1.73_m2} (ref >59)
GFR calc non Af Amer: 74 mL/min/{1.73_m2} (ref >59)
Globulin, Total: 2.9 g/dL (ref 1.5–4.5)
Glucose: 82 mg/dL (ref 65–99)
Potassium: 4.1 mmol/L (ref 3.5–5.2)
Sodium: 138 mmol/L (ref 134–144)
Total Protein: 7.5 g/dL (ref 6.0–8.5)

## 2020-04-18 LAB — INSULIN, RANDOM: INSULIN: 15.9 u[IU]/mL (ref 2.6–24.9)

## 2020-04-18 LAB — LIPID PANEL WITH LDL/HDL RATIO
Cholesterol, Total: 172 mg/dL (ref 100–199)
HDL: 70 mg/dL (ref >39)
LDL Chol Calc (NIH): 91 mg/dL (ref 0–99)
LDL/HDL Ratio: 1.3 ratio (ref 0.0–3.2)
Triglycerides: 56 mg/dL (ref 0–149)
VLDL Cholesterol Cal: 11 mg/dL (ref 5–40)

## 2020-04-18 LAB — VITAMIN D 25 HYDROXY (VIT D DEFICIENCY, FRACTURES): Vit D, 25-Hydroxy: 127 ng/mL — ABNORMAL HIGH (ref 30.0–100.0)

## 2020-04-18 LAB — HEMOGLOBIN A1C
Est. average glucose Bld gHb Est-mCnc: 114 mg/dL
Hgb A1c MFr Bld: 5.6 % (ref 4.8–5.6)

## 2020-04-21 NOTE — Progress Notes (Signed)
Chief Complaint:   OBESITY Cassandra [name] is here to discuss her progress with her obesity treatment plan along with follow-up of her obesity related diagnoses. Cassandra [name] is on keeping a food journal and adhering to recommended goals of 1000-1200 calories and 75+ grams of protein daily and states she is following her eating plan approximately 85-90% of the time. Cassandra [name] states she is walking for 40 minutes 7 times per week.  Today's visit was #: 9 Starting weight: 193 lbs Starting date: 11/13/2019 Today's weight: 191 lbs Today's date: 04/17/2020 Total lbs lost to date: 2 Total lbs lost since last in-office visit: 2  Interim History: Lashann has done better with weight loss since her last visit. She is trying to keep a journal and she is doing well meeting her calorie goal, but likely not her protein goals.  Subjective:   1. Pre-diabetes Cassandra [name] continues to work on eating healthier. She is stable on metformin and her A1c has improved. She is due for labs today.  2. Vitamin D deficiency Cassandra [name] is on Vit D, and she is due to have labs checked to assess her progress.  3. Hyperlipidemia, unspecified hyperlipidemia type Cassandra [name]'s last LDL was elevated. She is working on improving with diet. She is due for labs today.  Assessment/Plan:   1. Pre-diabetes Cobie will continue to work on weight loss, exercise, and decreasing simple carbohydrates to help decrease the risk of diabetes. We will check labs today.  - Hemoglobin A1c - Insulin, random - Comprehensive metabolic panel  2. Vitamin D deficiency Low Vitamin D level contributes to fatigue and are associated with obesity, breast, and colon cancer. We will check labs today. Cassandra [name] will follow-up for routine testing of Vitamin D, at least 2-3 times per year to avoid over-replacement.  - VITAMIN D 25 Hydroxy (Vit-D Deficiency, Fractures)  3. Hyperlipidemia, unspecified hyperlipidemia type Cardiovascular risk and specific lipid/LDL goals reviewed. We  discussed several lifestyle modifications today. Arianah will continue to work on diet, exercise and weight loss efforts. We will check labs today. Orders and follow up as documented in patient record.   - Lipid Panel With LDL/HDL Ratio  4. Class 1 obesity with serious comorbidity and body mass index (BMI) of 31.0 to 31.9 in adult, unspecified obesity type Cassandra [name] is currently in the action stage of change. As such, her goal is to continue with weight loss efforts. She has agreed to keeping a food journal and adhering to recommended goals of 1200 calories and 70+ grams of protein daily.   Exercise goals: As is.  Behavioral modification strategies: increasing lean protein intake.  Cassandra [name] has agreed to follow-up with our clinic in 4 weeks. She was informed of the importance of frequent follow-up visits to maximize her success with intensive lifestyle modifications for her multiple health conditions.   Cassandra [name] was informed we would discuss her lab results at her next visit unless there is a critical issue that needs to be addressed sooner. Cassandra [name] agreed to keep her next visit at the agreed upon time to discuss these results.  Objective:   Blood pressure 117/68, [name] 64, temperature 97.7 F (36.5 C), height 5\' 5"  (1.651 m), weight 191 lb (86.6 kg), SpO2 96 %. Body mass index is 31.78 kg/m.  General: Cooperative, alert, well developed, in no acute distress. HEENT: Conjunctivae and lids unremarkable. Cardiovascular: Regular rhythm.  Lungs: Normal work of breathing. Neurologic: No focal deficits.   Lab Results  Component Value Date   CREATININE 0.79 04/17/2020  BUN 21 04/17/2020   NA 138 04/17/2020   K 4.1 04/17/2020   CL 100 04/17/2020   CO2 25 04/17/2020   Lab Results  Component Value Date   ALT 6 04/17/2020   AST 14 04/17/2020   ALKPHOS 108 04/17/2020   BILITOT 0.4 04/17/2020   Lab Results  Component Value Date   HGBA1C 5.6 04/17/2020   HGBA1C 5.7 (H) 11/13/2019   HGBA1C 5.4  08/29/2019   HGBA1C 6.1 01/17/2019   HGBA1C 4.9 01/06/2007   Lab Results  Component Value Date   INSULIN 15.9 04/17/2020   INSULIN 30.1 (H) 11/13/2019   Lab Results  Component Value Date   TSH 1.680 11/13/2019   Lab Results  Component Value Date   CHOL 172 04/17/2020   HDL 70 04/17/2020   LDLCALC 91 04/17/2020   TRIG 56 04/17/2020   CHOLHDL 3 01/17/2019   Lab Results  Component Value Date   WBC 7.7 11/13/2019   HGB 14.5 11/13/2019   HCT 47.4 (H) 11/13/2019   MCV 86 11/13/2019   PLT 283 11/13/2019   Lab Results  Component Value Date   IRON 66 11/13/2019   TIBC 465 (H) 11/13/2019   FERRITIN 30 11/13/2019    Obesity Behavioral Intervention:   Approximately 15 minutes were spent on the discussion below.  ASK: We discussed the diagnosis of obesity with Cassandra [name] today and Cassandra [name] agreed to give [place] permission to discuss obesity behavioral modification therapy today.  ASSESS: Cassandra [name] has the diagnosis of obesity and her BMI today is 31.78. Cassandra [name] is in the action stage of change.   ADVISE: Cassandra [name] was educated on the multiple health risks of obesity as well as the benefit of weight loss to improve her health. She was advised of the need for long term treatment and the importance of lifestyle modifications to improve her current health and to decrease her risk of future health problems.  AGREE: Multiple dietary modification options and treatment options were discussed and Cassandra [name] agreed to follow the recommendations documented in the above note.  ARRANGE: Cassandra [name] was educated on the importance of frequent visits to treat obesity as outlined per CMS and USPSTF guidelines and agreed to schedule her next follow up appointment today.  Attestation Statements:   Reviewed by clinician on day of visit: allergies, medications, problem list, medical history, surgical history, family history, social history, and previous encounter notes.   I, Trixie Dredge, am acting as transcriptionist for  Dennard Nip, MD.  I have reviewed the above documentation for accuracy and completeness, and I agree with the above. -  Dennard Nip, MD

## 2020-04-21 NOTE — Progress Notes (Signed)
Please have pt stop her vit D for now as she is overreplaced.

## 2020-04-22 DIAGNOSIS — J3089 Other allergic rhinitis: Secondary | ICD-10-CM | POA: Diagnosis not present

## 2020-04-22 DIAGNOSIS — J301 Allergic rhinitis due to pollen: Secondary | ICD-10-CM | POA: Diagnosis not present

## 2020-04-28 DIAGNOSIS — J3089 Other allergic rhinitis: Secondary | ICD-10-CM | POA: Diagnosis not present

## 2020-04-28 DIAGNOSIS — J301 Allergic rhinitis due to pollen: Secondary | ICD-10-CM | POA: Diagnosis not present

## 2020-05-02 DIAGNOSIS — J301 Allergic rhinitis due to pollen: Secondary | ICD-10-CM | POA: Diagnosis not present

## 2020-05-02 DIAGNOSIS — J3089 Other allergic rhinitis: Secondary | ICD-10-CM | POA: Diagnosis not present

## 2020-05-03 ENCOUNTER — Encounter (INDEPENDENT_AMBULATORY_CARE_PROVIDER_SITE_OTHER): Payer: Self-pay | Admitting: Family Medicine

## 2020-05-05 ENCOUNTER — Encounter (INDEPENDENT_AMBULATORY_CARE_PROVIDER_SITE_OTHER): Payer: Self-pay

## 2020-05-06 DIAGNOSIS — J3089 Other allergic rhinitis: Secondary | ICD-10-CM | POA: Diagnosis not present

## 2020-05-06 DIAGNOSIS — J301 Allergic rhinitis due to pollen: Secondary | ICD-10-CM | POA: Diagnosis not present

## 2020-05-07 ENCOUNTER — Encounter (INDEPENDENT_AMBULATORY_CARE_PROVIDER_SITE_OTHER): Payer: Self-pay | Admitting: Family Medicine

## 2020-05-07 ENCOUNTER — Other Ambulatory Visit (INDEPENDENT_AMBULATORY_CARE_PROVIDER_SITE_OTHER): Payer: Self-pay | Admitting: Family Medicine

## 2020-05-07 ENCOUNTER — Ambulatory Visit (INDEPENDENT_AMBULATORY_CARE_PROVIDER_SITE_OTHER): Payer: PPO | Admitting: Family Medicine

## 2020-05-07 ENCOUNTER — Other Ambulatory Visit: Payer: Self-pay

## 2020-05-07 VITALS — BP 106/69 | HR 72 | Temp 97.9°F | Ht 65.0 in | Wt 191.0 lb

## 2020-05-07 DIAGNOSIS — R7303 Prediabetes: Secondary | ICD-10-CM

## 2020-05-07 DIAGNOSIS — Z6831 Body mass index (BMI) 31.0-31.9, adult: Secondary | ICD-10-CM | POA: Diagnosis not present

## 2020-05-07 DIAGNOSIS — E669 Obesity, unspecified: Secondary | ICD-10-CM

## 2020-05-07 DIAGNOSIS — E559 Vitamin D deficiency, unspecified: Secondary | ICD-10-CM | POA: Diagnosis not present

## 2020-05-07 MED ORDER — METFORMIN HCL 500 MG PO TABS: 500.0000 mg | ORAL_TABLET | Freq: Two times a day (BID) | ORAL | 0 refills | Status: DC

## 2020-05-08 NOTE — Progress Notes (Signed)
Chief Complaint:   OBESITY Cassandra [name] is here to discuss her progress with her obesity treatment plan along with follow-up of her obesity related diagnoses. Cassandra [name] is on keeping a food journal and adhering to recommended goals of 1200 calories and 70+ grams of protein daily and states she is following her eating plan approximately 85-90% of the time. Cassandra [name] states she is walking for 40 minutes 5-7 times per week.  Today's visit was #: 10 Starting weight: 193 lbs Starting date: 11/13/2019 Today's weight: 191 lbs Today's date: 05/07/2020 Total lbs lost to date: 2 Total lbs lost since last in-office visit: 0  Interim History: Cassandra [name] has done very well maintaining her weight over Thanksgiving. Her hunger is controlled, but she struggles to eat all of her protein.  Subjective:   1. Pre-diabetes Cassandra [name] is doing well with diet and metformin. Her A1c and fasting insulin have improved. I discussed labs with the patient today.  2. Vitamin [name] deficiency Cassandra [name] is on Cassandra [name] and has become over-replaced. She stopped her OTC Cassandra [name], and she denies nausea, vomiting, or muscle weakness. I discussed labs with the patient today.  Assessment/Plan:   1. Pre-diabetes Cassandra [name] will continue to work on weight loss efforts, diet, exercise, and decreasing simple carbohydrates to help decrease the risk of diabetes. We will refill metformin for 1 month.  - metFORMIN (GLUCOPHAGE) 500 MG tablet; Take 1 tablet (500 mg total) by mouth 2 (two) times daily with a meal.  Dispense: 60 tablet; Refill: 0  2. Vitamin [name] deficiency Cassandra [name] level contributes to fatigue and are associated with obesity, breast, and colon cancer. Cassandra [name] agreed to continue to hold allVit [name] another 3 months, and then we will recheck labs. She will follow-up for routine testing of Vitamin [name], at least 2-3 times per year to avoid over-replacement.  3. Class 1 obesity with serious comorbidity and body mass index (BMI) of 31.0 to 31.9 in adult,  unspecified obesity type Cassandra [name] is currently in the action stage of change. As such, her goal is to continue with weight loss efforts. She has agreed to keeping a food journal and adhering to recommended goals of 1200 calories and 70+ grams of protein daily.   Shredded [employer] were given today.  Exercise goals: As is.  Behavioral modification strategies: meal planning and cooking strategies.  Cassandra [name] has agreed to follow-up with our clinic in 4 weeks. She was informed of the importance of frequent follow-up visits to maximize her success with intensive lifestyle modifications for her multiple health conditions.   Objective:   Blood pressure 106/69, [name] 72, temperature 97.9 F (36.6 C), height 5\' 5"  (1.651 m), weight 191 lb (86.6 kg), SpO2 97 %. Body mass index is 31.78 kg/m.  General: Cooperative, alert, well developed, in no acute distress. HEENT: Conjunctivae and lids unremarkable. Cardiovascular: Regular rhythm.  Lungs: Normal work of breathing. Neurologic: No focal deficits.   Lab Results  Component Value Date   CREATININE 0.79 04/17/2020   BUN 21 04/17/2020   NA 138 04/17/2020   K 4.1 04/17/2020   CL 100 04/17/2020   CO2 25 04/17/2020   Lab Results  Component Value Date   ALT 6 04/17/2020   AST 14 04/17/2020   ALKPHOS 108 04/17/2020   BILITOT 0.4 04/17/2020   Lab Results  Component Value Date   HGBA1C 5.6 04/17/2020   HGBA1C 5.7 (H) 11/13/2019   HGBA1C 5.4 08/29/2019   HGBA1C 6.1 01/17/2019   HGBA1C 4.9 01/06/2007  Lab Results  Component Value Date   INSULIN 15.9 04/17/2020   INSULIN 30.1 (H) 11/13/2019   Lab Results  Component Value Date   TSH 1.680 11/13/2019   Lab Results  Component Value Date   CHOL 172 04/17/2020   HDL 70 04/17/2020   LDLCALC 91 04/17/2020   TRIG 56 04/17/2020   CHOLHDL 3 01/17/2019   Lab Results  Component Value Date   WBC 7.7 11/13/2019   HGB 14.5 11/13/2019   HCT 47.4 (H) 11/13/2019   MCV 86 11/13/2019   PLT  283 11/13/2019   Lab Results  Component Value Date   IRON 66 11/13/2019   TIBC 465 (H) 11/13/2019   FERRITIN 30 11/13/2019    Obesity Behavioral Intervention:   Approximately 15 minutes were spent on the discussion below.  ASK: We discussed the diagnosis of obesity with Cassandra [name] today and Cassandra [name] agreed to give [place] permission to discuss obesity behavioral modification therapy today.  ASSESS: Cassandra [name] has the diagnosis of obesity and her BMI today is 31.78. Cassandra [name] is in the action stage of change.   ADVISE: Cassandra [name] was educated on the multiple health risks of obesity as well as the benefit of weight loss to improve her health. She was advised of the need for long term treatment and the importance of lifestyle modifications to improve her current health and to decrease her risk of future health problems.  AGREE: Multiple dietary modification options and treatment options were discussed and Cassandra [name] agreed to follow the recommendations documented in the above note.  ARRANGE: Cassandra [name] was educated on the importance of frequent visits to treat obesity as outlined per CMS and USPSTF guidelines and agreed to schedule her next follow up appointment today.  Attestation Statements:   Reviewed by clinician on day of visit: allergies, medications, problem list, medical history, surgical history, family history, social history, and previous encounter notes.   I, Trixie Dredge, am acting as transcriptionist for Dennard Nip, MD.  I have reviewed the above documentation for accuracy and completeness, and I agree with the above. -  Dennard Nip, MD

## 2020-05-09 DIAGNOSIS — J3089 Other allergic rhinitis: Secondary | ICD-10-CM | POA: Diagnosis not present

## 2020-05-09 DIAGNOSIS — J301 Allergic rhinitis due to pollen: Secondary | ICD-10-CM | POA: Diagnosis not present

## 2020-05-10 ENCOUNTER — Encounter: Payer: Self-pay | Admitting: Internal Medicine

## 2020-05-13 DIAGNOSIS — J301 Allergic rhinitis due to pollen: Secondary | ICD-10-CM | POA: Diagnosis not present

## 2020-05-13 DIAGNOSIS — J3089 Other allergic rhinitis: Secondary | ICD-10-CM | POA: Diagnosis not present

## 2020-05-15 DIAGNOSIS — H5203 Hypermetropia, bilateral: Secondary | ICD-10-CM | POA: Diagnosis not present

## 2020-05-15 DIAGNOSIS — H52223 Regular astigmatism, bilateral: Secondary | ICD-10-CM | POA: Diagnosis not present

## 2020-05-15 DIAGNOSIS — H524 Presbyopia: Secondary | ICD-10-CM | POA: Diagnosis not present

## 2020-05-15 DIAGNOSIS — H25013 Cortical age-related cataract, bilateral: Secondary | ICD-10-CM | POA: Diagnosis not present

## 2020-05-19 DIAGNOSIS — J301 Allergic rhinitis due to pollen: Secondary | ICD-10-CM | POA: Diagnosis not present

## 2020-05-19 DIAGNOSIS — J3089 Other allergic rhinitis: Secondary | ICD-10-CM | POA: Diagnosis not present

## 2020-05-26 DIAGNOSIS — J301 Allergic rhinitis due to pollen: Secondary | ICD-10-CM | POA: Diagnosis not present

## 2020-05-26 DIAGNOSIS — J3089 Other allergic rhinitis: Secondary | ICD-10-CM | POA: Diagnosis not present

## 2020-06-01 ENCOUNTER — Other Ambulatory Visit: Payer: Self-pay | Admitting: Internal Medicine

## 2020-06-02 ENCOUNTER — Encounter (INDEPENDENT_AMBULATORY_CARE_PROVIDER_SITE_OTHER): Payer: Self-pay | Admitting: Family Medicine

## 2020-06-02 ENCOUNTER — Ambulatory Visit (INDEPENDENT_AMBULATORY_CARE_PROVIDER_SITE_OTHER): Payer: PPO | Admitting: Family Medicine

## 2020-06-02 ENCOUNTER — Encounter: Payer: Self-pay | Admitting: Family Medicine

## 2020-06-02 ENCOUNTER — Other Ambulatory Visit (INDEPENDENT_AMBULATORY_CARE_PROVIDER_SITE_OTHER): Payer: Self-pay | Admitting: Family Medicine

## 2020-06-02 DIAGNOSIS — R7303 Prediabetes: Secondary | ICD-10-CM

## 2020-06-02 NOTE — Telephone Encounter (Signed)
Last OV with Dr. Beasley 

## 2020-06-03 ENCOUNTER — Ambulatory Visit: Payer: PPO | Admitting: Family Medicine

## 2020-06-04 ENCOUNTER — Ambulatory Visit (INDEPENDENT_AMBULATORY_CARE_PROVIDER_SITE_OTHER): Payer: PPO | Admitting: Family Medicine

## 2020-06-04 ENCOUNTER — Other Ambulatory Visit: Payer: Self-pay

## 2020-06-04 ENCOUNTER — Encounter (INDEPENDENT_AMBULATORY_CARE_PROVIDER_SITE_OTHER): Payer: Self-pay | Admitting: Family Medicine

## 2020-06-04 VITALS — BP 100/67 | HR 90 | Temp 98.0°F | Ht 65.0 in | Wt 193.0 lb

## 2020-06-04 DIAGNOSIS — Z6832 Body mass index (BMI) 32.0-32.9, adult: Secondary | ICD-10-CM

## 2020-06-04 DIAGNOSIS — E669 Obesity, unspecified: Secondary | ICD-10-CM | POA: Diagnosis not present

## 2020-06-04 DIAGNOSIS — E66811 Obesity, class 1: Secondary | ICD-10-CM

## 2020-06-04 DIAGNOSIS — R7303 Prediabetes: Secondary | ICD-10-CM

## 2020-06-05 DIAGNOSIS — J301 Allergic rhinitis due to pollen: Secondary | ICD-10-CM | POA: Diagnosis not present

## 2020-06-05 DIAGNOSIS — J3089 Other allergic rhinitis: Secondary | ICD-10-CM | POA: Diagnosis not present

## 2020-06-05 NOTE — Progress Notes (Signed)
Chief Complaint:   OBESITY Cassandra [name] is here to discuss her progress with her obesity treatment plan along with follow-up of her obesity related diagnoses. Cassandra [name] is on keeping a food journal and adhering to recommended goals of 1200 calories and 70+ grams of protein daily and states she is following her eating plan approximately 80% of the time. Cassandra [name] states she is walking for 40 minutes 5 times per week.  Today's visit was #: 11 Starting weight: 193 lbs Starting date: 11/13/2019 Today's weight: 193 lbs Today's date: 06/04/2020 Total lbs lost to date: 0 Total lbs lost since last in-office visit: 0  Interim History: Cassandra [name] did well with minimizing her weight gain over the holidays. She is ready to get back on track with her eating plan.  Subjective:   1. Pre-diabetes Cassandra [name]'s metformin was refilled over the holiday break. She is working on diet and weight loss. She denies nausea or vomiting.  Assessment/Plan:   1. Pre-diabetes Cassandra [name] will continue to work on weight loss, diet, exercise, and decreasing simple carbohydrates to help decrease the risk of diabetes. We will continue to manage her medications, no refill needed today.  2. Class 1 obesity with serious comorbidity and body mass index (BMI) of 32.0 to 32.9 in adult, unspecified obesity type Cassandra [name] is currently in the action stage of change. As such, her goal is to continue with weight loss efforts. She has agreed to the Category 2 Plan with lean meat equivalants.   Exercise goals: As is. Cassandra [name] is to discuss strengthening exercise with her physical therapist tomorrow.  Behavioral modification strategies: increasing lean protein intake and meal planning and cooking strategies.  Cassandra [name] has agreed to follow-up with our clinic in 4 weeks. She was informed of the importance of frequent follow-up visits to maximize her success with intensive lifestyle modifications for her multiple health conditions.   Objective:   Blood pressure  100/67, pulse 90, temperature 98 F (36.7 C), height 5\' 5"  (1.651 m), weight 193 lb (87.5 kg), SpO2 99 %. Body mass index is 32.12 kg/m.  General: Cooperative, alert, well developed, in no acute distress. HEENT: Conjunctivae and lids unremarkable. Cardiovascular: Regular rhythm.  Lungs: Normal work of breathing. Neurologic: No focal deficits.   Lab Results  Component Value Date   CREATININE 0.79 04/17/2020   BUN 21 04/17/2020   NA 138 04/17/2020   K 4.1 04/17/2020   CL 100 04/17/2020   CO2 25 04/17/2020   Lab Results  Component Value Date   ALT 6 04/17/2020   AST 14 04/17/2020   ALKPHOS 108 04/17/2020   BILITOT 0.4 04/17/2020   Lab Results  Component Value Date   HGBA1C 5.6 04/17/2020   HGBA1C 5.7 (H) 11/13/2019   HGBA1C 5.4 08/29/2019   HGBA1C 6.1 01/17/2019   HGBA1C 4.9 01/06/2007   Lab Results  Component Value Date   INSULIN 15.9 04/17/2020   INSULIN 30.1 (H) 11/13/2019   Lab Results  Component Value Date   TSH 1.680 11/13/2019   Lab Results  Component Value Date   CHOL 172 04/17/2020   HDL 70 04/17/2020   LDLCALC 91 04/17/2020   TRIG 56 04/17/2020   CHOLHDL 3 01/17/2019   Lab Results  Component Value Date   WBC 7.7 11/13/2019   HGB 14.5 11/13/2019   HCT 47.4 (H) 11/13/2019   MCV 86 11/13/2019   PLT 283 11/13/2019   Lab Results  Component Value Date   IRON 66 11/13/2019   TIBC 465 (H) 11/13/2019  FERRITIN 30 11/13/2019    Obesity Behavioral Intervention:   Approximately 15 minutes were spent on the discussion below.  ASK: We discussed the diagnosis of obesity with Cassandra [name] today and Cassandra [name] agreed to give [place] permission to discuss obesity behavioral modification therapy today.  ASSESS: Cassandra [name] has the diagnosis of obesity and her BMI today is 32.12. Cassandra [name] is in the action stage of change.   ADVISE: Cassandra [name] was educated on the multiple health risks of obesity as well as the benefit of weight loss to improve her health. She was advised of the need  for long term treatment and the importance of lifestyle modifications to improve her current health and to decrease her risk of future health problems.  AGREE: Multiple dietary modification options and treatment options were discussed and Cassandra [name] agreed to follow the recommendations documented in the above note.  ARRANGE: Cassandra [name] was educated on the importance of frequent visits to treat obesity as outlined per CMS and USPSTF guidelines and agreed to schedule her next follow up appointment today.  Attestation Statements:   Reviewed by clinician on day of visit: allergies, medications, problem list, medical history, surgical history, family history, social history, and previous encounter notes.   I, Burt Knack, am acting as transcriptionist for Quillian Quince, MD.  I have reviewed the above documentation for accuracy and completeness, and I agree with the above. -  Quillian Quince, MD

## 2020-06-06 ENCOUNTER — Other Ambulatory Visit: Payer: Self-pay | Admitting: Family Medicine

## 2020-06-10 DIAGNOSIS — J3089 Other allergic rhinitis: Secondary | ICD-10-CM | POA: Diagnosis not present

## 2020-06-10 DIAGNOSIS — J301 Allergic rhinitis due to pollen: Secondary | ICD-10-CM | POA: Diagnosis not present

## 2020-06-12 ENCOUNTER — Encounter: Payer: Self-pay | Admitting: Family Medicine

## 2020-06-12 ENCOUNTER — Other Ambulatory Visit: Payer: Self-pay

## 2020-06-12 ENCOUNTER — Ambulatory Visit (INDEPENDENT_AMBULATORY_CARE_PROVIDER_SITE_OTHER): Payer: PPO

## 2020-06-12 ENCOUNTER — Ambulatory Visit (INDEPENDENT_AMBULATORY_CARE_PROVIDER_SITE_OTHER): Payer: PPO | Admitting: Family Medicine

## 2020-06-12 VITALS — BP 106/72 | HR 81 | Ht 65.0 in | Wt 202.0 lb

## 2020-06-12 DIAGNOSIS — M25551 Pain in right hip: Secondary | ICD-10-CM | POA: Diagnosis not present

## 2020-06-12 DIAGNOSIS — M545 Low back pain, unspecified: Secondary | ICD-10-CM | POA: Diagnosis not present

## 2020-06-12 DIAGNOSIS — Z6832 Body mass index (BMI) 32.0-32.9, adult: Secondary | ICD-10-CM

## 2020-06-12 DIAGNOSIS — M1712 Unilateral primary osteoarthritis, left knee: Secondary | ICD-10-CM | POA: Diagnosis not present

## 2020-06-12 DIAGNOSIS — E6609 Other obesity due to excess calories: Secondary | ICD-10-CM

## 2020-06-12 DIAGNOSIS — G8929 Other chronic pain: Secondary | ICD-10-CM | POA: Diagnosis not present

## 2020-06-12 NOTE — Assessment & Plan Note (Signed)
Continue to work on weight loss.  Discussed with patient greater than 33 minutes today of all her ailments.

## 2020-06-12 NOTE — Assessment & Plan Note (Signed)
Right hip pain overall.  Patient does have some limited range of motion with internal range of motion.  Could be more likely arthritic changes.  We will get x-ray to further evaluate.  Change exercises a little bit for more stability.  Patient is doing well overall and follow-up with me again 3 to 4 months

## 2020-06-12 NOTE — Progress Notes (Signed)
[place] Phone: [phone] Subjective:   [name], am serving as a scribe for Dr. Hulan Saas. This visit occurred during the SARS-CoV-2 public health emergency.  Safety protocols were in place, including screening questions prior to the visit, additional usage of staff PPE, and extensive cleaning of exam room while observing appropriate contact time as indicated for disinfecting solutions.   I'm seeing this patient by the request  of:  Erline Hau, MD  CC: Back pain follow-up  MPN:[id number]   03/03/2020 Working with healthy weight and wellness.  No significant weight loss over the course of time yet.  Patient is frustrated but hopefully we will see results in.  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  Patient will have a nerve root injection in hopes that patient responds well.  Continue gabapentin, follow-up 4 weeks after the injection  Chronic problem with worsening exacerbation.  Discussed different treatment options and decided to do an injection today discussed icing regimen and home exercises.  Patient is encouraged to try to brace whenever possible.  Follow-up with me again in 4 to 8 weeks.  Update 06/12/2020 RAMANDA PAULES is a 75 y.o. female coming in with complaint of left knee and low back pain. Nerve root injectoin 04/09/2020. Patient states that her back pain has improved. Does have feeling of "tightening" of hamstrings when she lies on her back.   Right hip pain in groin continues with hip abduction.  Patient states that it is not stopping her from activity just uncomfortable.  Visiting healthy weight and wellness since June and has not lost any weight. Is concerned.   Patient has been taking metformin but past 4 months and is having blurred vision. Wants to know if metformin or gabapentin could cause blurry vision. Saw optometrist who stated patient has beginning of cataract in  right eye.   Patient mentions having recently blood work with healthy weight and wellness. Looks to be improving in certain aspects per patient.  Patient feels like she is improving but has not noticed any improvement in the weight.    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  Knee x-rays taken in September 2021 shows moderate arthritic changes of the left knee and replacement of the right knee.  Past Medical History:  Diagnosis Date  . Anemia   . Anxiety   . Asthma   . Back pain   . BPPV (benign paroxysmal positional vertigo)   . Chronic allergic conjunctivitis   . Depression   . Diverticulosis   . Fibromyalgia   . Gallbladder problem   . Gallstones   . GERD (gastroesophageal reflux disease)   . Hiatal hernia   . History of bladder stone   . Iron (Fe) deficiency anemia   . Joint pain   . Lump in female breast   . Mild persistent asthma    followed by pcp--- dr r. sharma ([place] allergy/asthma)  . OA (osteoarthritis)    knees, hands  . OAB (overactive bladder)   . OSA (obstructive sleep apnea)    per pt has not used cpap several years  . Pancreatic cyst   . PONV (postoperative nausea and vomiting)   . RLS (restless legs syndrome)   . Seasonal and  perennial allergic rhinitis   . SUI (stress urinary incontinence, female)   . Swallowing difficulty   . Vitamin B12 deficiency   . Vitamin D deficiency   . Wears glasses    Past Surgical History:  Procedure Laterality Date  . BLADDER SUSPENSION  1990's   sling  . bladder tacking  2010   with mesh  . BREAST LUMPECTOMY Left 1985   Benign cyst  . BREAST REDUCTION SURGERY Bilateral 2003 approx.  . CORNEA LESION EXCISION Right 2005  approx.  . CYSTOSCOPY N/A 07/12/2016   Procedure: CYSTOSCOPY, VAGINOSCOPY, EXAM UNDER ANESTHESIA,  STONE LITHOTRIPSY,;  Surgeon: Cleon Gustin, MD;  Location: [facility];  Service: Urology;  Laterality: N/A;  . CYSTOSCOPY N/A 10/11/2016   Procedure: CYSTOSCOPY;  Surgeon: Cleon Gustin, MD;  Location: [facility];  Service: Urology;  Laterality: N/A;  . CYSTOSCOPY WITH LITHOLAPAXY N/A 10/11/2016   Procedure: CYSTOSCOPY WITH LITHOLAPAXY/ EXCISION OF MESH;  Surgeon: Cleon Gustin, MD;  Location: [facility];  Service: Urology;  Laterality: N/A;  . CYSTOSCOPY WITH LITHOLAPAXY N/A 01/22/2019   Procedure: CYSTOSCOPY WITH LITHOLAPAXY;  Surgeon: Cleon Gustin, MD;  Location: [facility];  Service: Urology;  Laterality: N/A;  1 HR  . HOLMIUM LASER APPLICATION  [id number]   Procedure: HOLMIUM LASER APPLICATION;  Surgeon: Cleon Gustin, MD;  Location: [facility];  Service: Urology;;  . HOLMIUM LASER APPLICATION N/A 0000000   Procedure: HOLMIUM LASER APPLICATION;  Surgeon: Cleon Gustin, MD;  Location: [facility];  Service: Urology;  Laterality: N/A;  . RIGHT KNEE ARTHROSCOPY  2013  . TOTAL KNEE ARTHROPLASTY Right 09/12/2012   Procedure: RIGHT TOTAL KNEE ARTHROPLASTY;  Surgeon: Gearlean Alf, MD;  Location: [facility];  Service: Orthopedics;  Laterality: Right;  Marland Kitchen VAGINAL HYSTERECTOMY  1984   Social History   Socioeconomic History  . Marital status: Widowed    Spouse name: Not on file  . Number of children: Not on file  . Years of education: Not on file  . Highest education level: Not on file  Occupational History  . Occupation: Biochemist, clinical Retired  Tobacco Use  . Smoking status: Never Smoker  . Smokeless tobacco: Never Used  Vaping Use  . Vaping Use: Never used  Substance and Sexual Activity  . Alcohol use: Yes    Comment: rare  . Drug use: Never  . Sexual activity: Not on file  Other Topics Concern  . Not on file  Social History Narrative  . Not on  file   Social Determinants of Health   Financial Resource Strain: Not on file  Food Insecurity: Not on file  Transportation Needs: Not on file  Physical Activity: Not on file  Stress: Not on file  Social Connections: Not on file   Allergies  Allergen Reactions  . Shellfish Allergy Hives  . Cephalosporins Hives  . Hydrochlorothiazide Hives  . Penicillins Hives  . Sulfa Antibiotics Hives   Family History  Problem Relation Age of Onset  . Kidney cancer Mother   . Diabetes Mother   . Obesity Mother   . Alcoholism Father   . Colon cancer Neg Hx   . Inflammatory bowel disease Neg Hx   . Liver disease Neg Hx   . Pancreatic cancer Neg Hx   . Stomach cancer Neg Hx   . Rectal cancer Neg Hx     Current Outpatient Medications (Endocrine & Metabolic):  .  metFORMIN (GLUCOPHAGE) 500 MG tablet, TAKE 1 TABLET BY MOUTH 2 TIMES DAILY WITH A MEAL.  Current Outpatient Medications (Cardiovascular):  Marland Kitchen  EPINEPHrine 0.3 mg/0.3 mL IJ SOAJ injection, Inject 0.3 mg into the muscle as needed for anaphylaxis. Marland Kitchen  spironolactone (ALDACTONE) 25 MG tablet, TAKE 1 TABLET BY MOUTH EVERY DAY  Current Outpatient Medications (Respiratory):  Marland Kitchen  BREO ELLIPTA 200-25 MCG/INH AEPB, Inhale 1 puff into the lungs daily.  .  fluticasone (FLONASE) 50 MCG/ACT nasal spray, Place into both nostrils. Marland Kitchen  levocetirizine (XYZAL) 5 MG tablet, Take 5 mg by mouth every evening.     Current Outpatient Medications (Other):  .  gabapentin (NEURONTIN) 100 MG capsule, TAKE 2 CAPSULES BY MOUTH AT BEDTIME. Marland Kitchen  Misc Natural Products (TART CHERRY ADVANCED) CAPS, Take 1 capsule by mouth. 1200mg  capsule .  omeprazole (PRILOSEC) 20 MG capsule, Take 1 capsule (20 mg total) by mouth 2 (two) times daily before a meal. (Patient taking differently: Take 20 mg by mouth daily.) .  psyllium (REGULOID) 0.52 g capsule, Take 0.52 g by mouth daily. .  Cholecalciferol (VITAMIN D-3) 125 MCG (5000 UT) TABS, Take 1 capsule by mouth daily.     Reviewed prior external information including notes and imaging from  primary care provider As well as notes that were available from care everywhere and other healthcare systems.  Past medical history, social, surgical and family history all reviewed in electronic medical record.  No pertanent information unless stated regarding to the chief complaint.   Review of Systems:  No headache, visual changes, nausea, vomiting, diarrhea, constipation, dizziness, abdominal pain, skin rash, fevers, chills, night sweats, weight loss, swollen lymph nodes, joint swelling, chest pain, shortness of breath, mood changes. POSITIVE muscle aches, body aches   Objective  Blood pressure 106/72, pulse 81, height 5\' 5"  (1.651 m), weight 202 lb (91.6 kg), SpO2 97 %.   General: No apparent distress alert and oriented x3 mood and affect normal, dressed appropriately.  Overweight HEENT: Pupils equal, extraocular movements intact  Respiratory: Patient's speak in full sentences and does not appear short of breath  Cardiovascular: No lower extremity edema, non tender, no erythema  Gait normal with good balance and coordination.  Back exam mild loss of lordosis.  Negative straight leg test.  Still some mild discomfort though with movement of the right hip deep tendon reflexes are intact.  Minimal tenderness to palpation of the paraspinal musculature lumbar spine  Right hip exam does have some mild decrease in internal range of motion of the hip.  Negative grind test.  Good strength  Left knee does show arthritic changes noted.  Mild instability with valgus and varus force.  Mild abnormal thigh to calf ratio noted.  Lacks last 5 degrees of flexion   Impression and Recommendations:     The above documentation has been reviewed and is accurate and complete Lyndal Pulley, DO

## 2020-06-12 NOTE — Patient Instructions (Signed)
Xray hip  Check CPAP Continue gabapentin See me in 3-4 months

## 2020-06-12 NOTE — Assessment & Plan Note (Signed)
Still doing well, no need for another injection continue conservative therapy

## 2020-06-12 NOTE — Assessment & Plan Note (Signed)
Improved after epidural, doing well, encouraged weight loss patient will continue with conservative therapy.  We will continue the gabapentin at the low dose and we will not increase at this time.  Follow-up with me again in 3 to 4 months

## 2020-06-19 ENCOUNTER — Encounter: Payer: Self-pay | Admitting: Family Medicine

## 2020-06-19 DIAGNOSIS — J301 Allergic rhinitis due to pollen: Secondary | ICD-10-CM | POA: Diagnosis not present

## 2020-06-19 DIAGNOSIS — J3089 Other allergic rhinitis: Secondary | ICD-10-CM | POA: Diagnosis not present

## 2020-06-24 ENCOUNTER — Encounter: Payer: Self-pay | Admitting: Internal Medicine

## 2020-06-24 DIAGNOSIS — J301 Allergic rhinitis due to pollen: Secondary | ICD-10-CM | POA: Diagnosis not present

## 2020-06-24 DIAGNOSIS — R928 Other abnormal and inconclusive findings on diagnostic imaging of breast: Secondary | ICD-10-CM | POA: Diagnosis not present

## 2020-06-24 DIAGNOSIS — J3089 Other allergic rhinitis: Secondary | ICD-10-CM | POA: Diagnosis not present

## 2020-06-24 DIAGNOSIS — N6001 Solitary cyst of right breast: Secondary | ICD-10-CM | POA: Diagnosis not present

## 2020-06-24 LAB — HM MAMMOGRAPHY

## 2020-06-25 ENCOUNTER — Encounter: Payer: Self-pay | Admitting: Internal Medicine

## 2020-06-28 ENCOUNTER — Other Ambulatory Visit (INDEPENDENT_AMBULATORY_CARE_PROVIDER_SITE_OTHER): Payer: Self-pay | Admitting: Family Medicine

## 2020-06-28 DIAGNOSIS — R7303 Prediabetes: Secondary | ICD-10-CM

## 2020-07-01 DIAGNOSIS — J301 Allergic rhinitis due to pollen: Secondary | ICD-10-CM | POA: Diagnosis not present

## 2020-07-01 DIAGNOSIS — J3089 Other allergic rhinitis: Secondary | ICD-10-CM | POA: Diagnosis not present

## 2020-07-02 ENCOUNTER — Encounter (INDEPENDENT_AMBULATORY_CARE_PROVIDER_SITE_OTHER): Payer: Self-pay | Admitting: Family Medicine

## 2020-07-02 ENCOUNTER — Other Ambulatory Visit: Payer: Self-pay

## 2020-07-02 ENCOUNTER — Ambulatory Visit (INDEPENDENT_AMBULATORY_CARE_PROVIDER_SITE_OTHER): Payer: PPO | Admitting: Family Medicine

## 2020-07-02 VITALS — BP 122/77 | HR 76 | Temp 97.9°F | Ht 65.0 in | Wt 200.0 lb

## 2020-07-02 DIAGNOSIS — Z6833 Body mass index (BMI) 33.0-33.9, adult: Secondary | ICD-10-CM

## 2020-07-02 DIAGNOSIS — E669 Obesity, unspecified: Secondary | ICD-10-CM | POA: Diagnosis not present

## 2020-07-02 DIAGNOSIS — R7303 Prediabetes: Secondary | ICD-10-CM | POA: Diagnosis not present

## 2020-07-02 MED ORDER — METFORMIN HCL 500 MG PO TABS: ORAL_TABLET | ORAL | 0 refills | Status: DC

## 2020-07-03 NOTE — Progress Notes (Signed)
Chief Complaint:   OBESITY Cassandra [name] is here to discuss her progress with her obesity treatment plan along with follow-up of her obesity related diagnoses. Cassandra [name] is on the Category 2 Plan and states she is following her eating plan approximately 80% of the time. Cassandra [name] states she is walking with wrist and ankle weights for 40 minutes 3 times per week.  Today's visit was #: 12 Starting weight: 193 lbs Starting date: 11/13/2019 Today's weight: 200 lbs Today's date: 07/02/2020 Total lbs lost to date: 0 Total lbs lost since last in-office visit: 0  Interim History: Cassandra [name] has been trying to portion control and smarter choices, and she is frustrated with her lack of weight loss. She states she is mindful of her food, but she recognizes her protein is below goal and is likely decreasing her RMR. She is not currently in the active stage of change and would like to take a "break" from the program.  Subjective:   1. Pre-diabetes Cassandra [name] is stable on metformin. She is struggling with diet and weight loss, but she feels the metformin is helping.  Assessment/Plan:   1. Pre-diabetes We will refill metformin for 90 days with no refills. Cassandra [name] is to restart weight loss efforts in 3 months.  - metFORMIN (GLUCOPHAGE) 500 MG tablet; TAKE 1 TABLET BY MOUTH 2 TIMES DAILY WITH A MEAL.  Dispense: 180 tablet; Refill: 0  2. Class 1 obesity with serious comorbidity and body mass index (BMI) of 33.0 to 33.9 in adult, unspecified obesity type Cassandra [name] is not currently in the action stage of change. As such, her goal is to maintain weight for now. She has agreed to practicing portion control and making smarter food choices, such as increasing vegetables and decreasing simple carbohydrates.   Cassandra [name] is to continue to be mindful and we will plan on restarting in the Spring.  Exercise goals: As is.  Behavioral modification strategies: increasing lean protein intake.  Cassandra [name] has agreed to follow-up with our clinic in 3  months. She was informed of the importance of frequent follow-up visits to maximize her success with intensive lifestyle modifications for her multiple health conditions.   Objective:   Blood pressure 122/77, pulse 76, temperature 97.9 F (36.6 C), height 5\' 5"  (1.651 m), weight 200 lb (90.7 kg), SpO2 98 %. Body mass index is 33.28 kg/m.  General: Cooperative, alert, well developed, in no acute distress. HEENT: Conjunctivae and lids unremarkable. Cardiovascular: Regular rhythm.  Lungs: Normal work of breathing. Neurologic: No focal deficits.   Lab Results  Component Value Date   CREATININE 0.79 04/17/2020   BUN 21 04/17/2020   NA 138 04/17/2020   K 4.1 04/17/2020   CL 100 04/17/2020   CO2 25 04/17/2020   Lab Results  Component Value Date   ALT 6 04/17/2020   AST 14 04/17/2020   ALKPHOS 108 04/17/2020   BILITOT 0.4 04/17/2020   Lab Results  Component Value Date   HGBA1C 5.6 04/17/2020   HGBA1C 5.7 (H) 11/13/2019   HGBA1C 5.4 08/29/2019   HGBA1C 6.1 01/17/2019   HGBA1C 4.9 01/06/2007   Lab Results  Component Value Date   INSULIN 15.9 04/17/2020   INSULIN 30.1 (H) 11/13/2019   Lab Results  Component Value Date   TSH 1.680 11/13/2019   Lab Results  Component Value Date   CHOL 172 04/17/2020   HDL 70 04/17/2020   LDLCALC 91 04/17/2020   TRIG 56 04/17/2020   CHOLHDL 3 01/17/2019   Lab Results  Component Value Date   WBC 7.7 11/13/2019   HGB 14.5 11/13/2019   HCT 47.4 (H) 11/13/2019   MCV 86 11/13/2019   PLT 283 11/13/2019   Lab Results  Component Value Date   IRON 66 11/13/2019   TIBC 465 (H) 11/13/2019   FERRITIN 30 11/13/2019   Attestation Statements:   Reviewed by clinician on day of visit: allergies, medications, problem list, medical history, surgical history, family history, social history, and previous encounter notes.  Time spent on visit including pre-visit chart review and post-visit care and charting was 30 minutes.    I, Trixie Dredge, am acting as transcriptionist for Dennard Nip, MD.  I have reviewed the above documentation for accuracy and completeness, and I agree with the above. -  Dennard Nip, MD

## 2020-07-08 DIAGNOSIS — J3089 Other allergic rhinitis: Secondary | ICD-10-CM | POA: Diagnosis not present

## 2020-07-08 DIAGNOSIS — J301 Allergic rhinitis due to pollen: Secondary | ICD-10-CM | POA: Diagnosis not present

## 2020-07-14 DIAGNOSIS — J3089 Other allergic rhinitis: Secondary | ICD-10-CM | POA: Diagnosis not present

## 2020-07-14 DIAGNOSIS — J301 Allergic rhinitis due to pollen: Secondary | ICD-10-CM | POA: Diagnosis not present

## 2020-07-18 ENCOUNTER — Encounter: Payer: Self-pay | Admitting: Gastroenterology

## 2020-07-18 ENCOUNTER — Ambulatory Visit: Payer: PPO | Admitting: Gastroenterology

## 2020-07-18 VITALS — BP 120/66 | HR 80 | Ht 65.0 in | Wt 203.0 lb

## 2020-07-18 DIAGNOSIS — K219 Gastro-esophageal reflux disease without esophagitis: Secondary | ICD-10-CM

## 2020-07-18 DIAGNOSIS — R12 Heartburn: Secondary | ICD-10-CM

## 2020-07-18 DIAGNOSIS — D490 Neoplasm of unspecified behavior of digestive system: Secondary | ICD-10-CM | POA: Diagnosis not present

## 2020-07-18 DIAGNOSIS — R1319 Other dysphagia: Secondary | ICD-10-CM

## 2020-07-18 NOTE — Patient Instructions (Addendum)
Let us know if your GERD symptoms worsen.   We will discuss possible scheduling of Manometry /Ph study at your next visit.   Please keep follow-up in 6-8 weeks-   09/11/20@ 1:30pm  If you are age 74 or older, your body mass index should be between 23-30. Your Body mass index is 33.78 kg/m. If this is out of the aforementioned range listed, please consider follow up with your Primary Care Provider.  If you are age 50 or younger, your body mass index should be between 19-25. Your Body mass index is 33.78 kg/m. If this is out of the aformentioned range listed, please consider follow up with your Primary Care Provider.    Thank you for choosing me and [facility].  Dr. Rush Landmark

## 2020-07-21 ENCOUNTER — Encounter: Payer: Self-pay | Admitting: Gastroenterology

## 2020-07-21 DIAGNOSIS — R12 Heartburn: Secondary | ICD-10-CM | POA: Insufficient documentation

## 2020-07-21 DIAGNOSIS — D490 Neoplasm of unspecified behavior of digestive system: Secondary | ICD-10-CM | POA: Insufficient documentation

## 2020-07-21 NOTE — Progress Notes (Signed)
[facility] VISIT   Primary Care Provider Isaac Bliss, Rayford Halsted, MD [place] [address] [phone]  Patient Profile: Cassandra [name] is a 75 y.o. female with a pmh significant for fibromyalgia, generalized anxiety, GERD, hiatal hernia, bladder stones, asthma, sleep apnea, BPPV, restless leg syndrome, diverticulosis, pancreatic cyst (BD-IPMN).  The patient presents to the [facility] for an evaluation and management of problem(s) noted below:  Problem List 1. Gastroesophageal reflux disease without esophagitis   2. Pyrosis   3. IPMN (intraductal papillary mucinous neoplasm)   4. Esophageal dysphagia     History of Present Illness Please see initial consultation note and prior progress notes for full details of HPI.    Interval History The patient returns for scheduled follow-up.  Her last visit in clinic was over a year ago.  The patient states that over the course of the last few months, she has began to experience issues of dysphagia once again.  She remains on PPI at 40 mg daily as a result of increasing pyrosis symptoms.  When she had tried to come off of her PPI completely she noticed her symptoms persisting.  She is not as worried about her pancreatic lesion since we have done good follow-up in the interval.  We had considered repeating an MRI in 2022 but we had also consider potentially pushing this out to 2023.  She feels comfortable with waiting until 2023 unless anything else develops.  She is not having any abdominal pain.  She is having difficulty with weight loss even though she is trying so that she can help her blood sugars.  GI Review of Systems Positive as above Negative for odynophagia, vomiting, early satiety, change in bowel habits, melena, hematochezia  Review of Systems General: Denies fevers/chills/unintentional weight loss Cardiovascular: Denies chest pain Pulmonary: Denies shortness of  breath/nocturnal cough Gastroenterological: See HPI Genitourinary: Denies darkened urine Hematological: Denies easy bruising/bleeding Dermatological: Denies jaundice Psychological: Mood is stable   Medications Current Outpatient Medications  Medication Sig Dispense Refill  . EPINEPHrine 0.3 mg/0.3 mL IJ SOAJ injection Inject 0.3 mg into the muscle as needed for anaphylaxis.    . fluticasone (FLONASE) 50 MCG/ACT nasal spray Place into both nostrils.    Marland Kitchen gabapentin (NEURONTIN) 100 MG capsule TAKE 2 CAPSULES BY MOUTH AT BEDTIME. 180 capsule 0  . levocetirizine (XYZAL) 5 MG tablet Take 5 mg by mouth every evening.   1  . metFORMIN (GLUCOPHAGE) 500 MG tablet TAKE 1 TABLET BY MOUTH 2 TIMES DAILY WITH A MEAL. 180 tablet 0  . Misc Natural Products (TART CHERRY ADVANCED) CAPS Take 1 capsule by mouth. 1200mg  capsule    . omeprazole (PRILOSEC) 20 MG capsule Take 1 capsule (20 mg total) by mouth 2 (two) times daily before a meal. (Patient taking differently: Take 20 mg by mouth daily.) 60 capsule 3  . psyllium (REGULOID) 0.52 g capsule Take 0.52 g by mouth daily.    Marland Kitchen spironolactone (ALDACTONE) 25 MG tablet TAKE 1 TABLET BY MOUTH EVERY DAY 90 tablet 1   No current facility-administered medications for this visit.    Allergies Allergies  Allergen Reactions  . Shellfish Allergy Hives  . Cephalosporins Hives  . Hydrochlorothiazide Hives  . Penicillins Hives  . Sulfa Antibiotics Hives    Histories Past Medical History:  Diagnosis Date  . Anemia   . Anxiety   . Asthma   . Back pain   . BPPV (benign paroxysmal positional vertigo)   .  Chronic allergic conjunctivitis   . Depression   . Diverticulosis   . Fibromyalgia   . Gallbladder problem   . Gallstones   . GERD (gastroesophageal reflux disease)   . Hiatal hernia   . History of bladder stone   . Iron (Fe) deficiency anemia   . Joint pain   . Lump in female breast   . Mild persistent asthma    followed by pcp--- dr r. sharma  ([place] allergy/asthma)  . OA (osteoarthritis)    knees, hands  . OAB (overactive bladder)   . OSA (obstructive sleep apnea)    per pt has not used cpap several years  . Pancreatic cyst   . PONV (postoperative nausea and vomiting)   . RLS (restless legs syndrome)   . Seasonal and perennial allergic rhinitis   . SUI (stress urinary incontinence, female)   . Swallowing difficulty   . Vitamin B12 deficiency   . Vitamin D deficiency   . Wears glasses    Past Surgical History:  Procedure Laterality Date  . BLADDER SUSPENSION  1990's   sling  . bladder tacking  2010   with mesh  . BREAST LUMPECTOMY Left 1985   Benign cyst  . BREAST REDUCTION SURGERY Bilateral 2003 approx.  . CORNEA LESION EXCISION Right 2005  approx.  . CYSTOSCOPY N/A 07/12/2016   Procedure: CYSTOSCOPY, VAGINOSCOPY, EXAM UNDER ANESTHESIA, STONE LITHOTRIPSY,;  Surgeon: Cleon Gustin, MD;  Location: [facility];  Service: Urology;  Laterality: N/A;  . CYSTOSCOPY N/A 10/11/2016   Procedure: CYSTOSCOPY;  Surgeon: Cleon Gustin, MD;  Location: [facility];  Service: Urology;  Laterality: N/A;  . CYSTOSCOPY WITH LITHOLAPAXY N/A 10/11/2016   Procedure: CYSTOSCOPY WITH LITHOLAPAXY/ EXCISION OF MESH;  Surgeon: Cleon Gustin, MD;  Location: [name];  Service: Urology;  Laterality: N/A;  . CYSTOSCOPY WITH LITHOLAPAXY N/A 01/22/2019   Procedure: CYSTOSCOPY WITH LITHOLAPAXY;  Surgeon: Cleon Gustin, MD;  Location: [facility];  Service: Urology;  Laterality: N/A;  1 HR  . HOLMIUM LASER APPLICATION  2/50/5397   Procedure: HOLMIUM LASER APPLICATION;  Surgeon: Cleon Gustin, MD;  [facility];  Service: Urology;;  . HOLMIUM LASER APPLICATION N/A 6/73/4193   Procedure: HOLMIUM LASER APPLICATION;  Surgeon: Cleon Gustin, MD;  Location: [facility];  Service: Urology;  Laterality: N/A;  . RIGHT KNEE  ARTHROSCOPY  2013  . TOTAL KNEE ARTHROPLASTY Right 09/12/2012   Procedure: RIGHT TOTAL KNEE ARTHROPLASTY;  Surgeon: Gearlean Alf, MD;  Location: [facility];  Service: Orthopedics;  Laterality: Right;  Marland Kitchen VAGINAL HYSTERECTOMY  1984   Social History   Socioeconomic History  . Marital status: Widowed    Spouse name: Not on file  . Number of children: Not on file  . Years of education: Not on file  . Highest education level: Not on file  Occupational History  . Occupation: Biochemist, clinical Retired  Tobacco Use  . Smoking status: Never Smoker  . Smokeless tobacco: Never Used  Vaping Use  . Vaping Use: Never used  Substance and Sexual Activity  . Alcohol use: Not Currently  . Drug use: Never  . Sexual activity: Not on file  Other Topics Concern  . Not on file  Social History Narrative  . Not on file   Social Determinants of Health   Financial Resource Strain: Not on file  Food Insecurity: Not on file  Transportation Needs: Not on  file  Physical Activity: Not on file  Stress: Not on file  Social Connections: Not on file  Intimate Partner Violence: Not on file   Family History  Problem Relation Age of Onset  . Kidney cancer Mother   . Diabetes Mother   . Obesity Mother   . Alcoholism Father   . Colon cancer Neg Hx   . Inflammatory bowel disease Neg Hx   . Liver disease Neg Hx   . Pancreatic cancer Neg Hx   . Stomach cancer Neg Hx   . Rectal cancer Neg Hx    I have reviewed her medical, social, and family history in detail and updated the electronic medical record as necessary.    PHYSICAL EXAMINATION  BP 120/66   Pulse 80   Ht 5\' 5"  (1.651 m)   Wt 203 lb (92.1 kg)   BMI 33.78 kg/m  Wt Readings from Last 3 Encounters:  07/18/20 203 lb (92.1 kg)  07/02/20 200 lb (90.7 kg)  06/12/20 202 lb (91.6 kg)  GEN: NAD, appears stated age, doesn't appear chronically ill PSYCH: Cooperative, without pressured speech EYE: Conjunctivae pink, sclerae anicteric ENT:  Masked CV: Nontachycardic RESP: No audible wheezing GI: NABS, soft, protuberant, nontender, without rebound or guarding MSK/EXT: No lower extremity edema SKIN: No jaundice NEURO:  Alert & Oriented x 3, no focal deficits   REVIEW OF DATA  I reviewed the following data at the time of this encounter:  GI Procedures and Studies  Previously reviewed  Laboratory Studies  Reviewed those in epic  Imaging Studies  No new imaging to review   ASSESSMENT  Ms. Bordeau is a 75 y.o. female with a pmh significant for fibromyalgia, generalized anxiety, GERD, hiatal hernia, bladder stones, asthma, sleep apnea, BPPV, restless leg syndrome, diverticulosis, pancreatic cyst (BD-IPMN).  The patient is seen today for evaluation and management of:  1. Gastroesophageal reflux disease without esophagitis   2. Pyrosis   3. IPMN (intraductal papillary mucinous neoplasm)   4. Esophageal dysphagia    The patient is hemodynamically stable.  Clinically, she seems to have begun to experience symptoms of dysphagia once again.  Although at the time of my empiric dilation I did not note a mucosal wrent she experienced a good improvement in her dysphagia symptoms overall.  However as she came off PPI therapy her pyrosis returned and her dysphagia symptoms seems to have recurred as well.  I have recommended consideration of esophageal manometry versus repeat empiric dilation up to 19 mm.  She would like to see how things go over the course of the coming weeks and decide upon this in the near future after she works on her weight loss as she knows that that can play some role with her pyrosis symptoms.  We will keep her on 20 mg twice daily PPI at this time.  In regards to her pancreatic cyst which is most consistent with a branch duct IPMN, we are going to plan on repeat imaging in January 2023 (2 years from her last imaging).  I think this is reasonable based on the overall stability and prior imaging.  Certainly, if something  changes in the interim or there is concern for pancreatic disease we will consider an earlier follow-up imaging study.  If there are significant changes in the pancreatic cyst we will consider the role of endoscopic ultrasound fine-needle aspiration/biopsy.  I will see her back in follow-up in the coming weeks.  All patient questions were answered to the best of  my ability, and the patient agrees to the aforementioned plan of action with follow-up as indicated.   PLAN  Patient reports last colonoscopy in 2015 with plan for 10-year follow-up and this is documented in the chart by PCP thus will await colonoscopy until 2025 unless other issues occur MRI/MRCP to be done in January 2023 (no FNA/FNB unless significant increase in size, size greater than 2.5 to 3 cm, or other red flag symptoms develop) Continue omeprazole 20 twice daily for now If over the course of the next few weeks her dysphagia returns then we will likely consider manometry If her dysphagia remains stable to improved for a period of more than 4 to 6 months we will consider one more empiric dilation to 19 mm May consider pH impedance testing in future Minimize stress and anxiety as much as possible   No orders of the defined types were placed in this encounter.   New Prescriptions   No medications on file   Modified Medications   No medications on file    Planned Follow Up No follow-ups on file.   Total Time in Face-to-Face and in Coordination of Care for patient including independent/personal interpretation/review of prior testing, medical history, examination, medication adjustment, communicating results with the patient directly, and documentation with the EHR is 25 minutes.   Justice Britain, MD [facility] Office # [phone]

## 2020-07-25 DIAGNOSIS — J3089 Other allergic rhinitis: Secondary | ICD-10-CM | POA: Diagnosis not present

## 2020-07-25 DIAGNOSIS — J301 Allergic rhinitis due to pollen: Secondary | ICD-10-CM | POA: Diagnosis not present

## 2020-07-31 DIAGNOSIS — J301 Allergic rhinitis due to pollen: Secondary | ICD-10-CM | POA: Diagnosis not present

## 2020-07-31 DIAGNOSIS — J3089 Other allergic rhinitis: Secondary | ICD-10-CM | POA: Diagnosis not present

## 2020-08-05 DIAGNOSIS — J301 Allergic rhinitis due to pollen: Secondary | ICD-10-CM | POA: Diagnosis not present

## 2020-08-05 DIAGNOSIS — J3089 Other allergic rhinitis: Secondary | ICD-10-CM | POA: Diagnosis not present

## 2020-08-12 DIAGNOSIS — J3089 Other allergic rhinitis: Secondary | ICD-10-CM | POA: Diagnosis not present

## 2020-08-12 DIAGNOSIS — J301 Allergic rhinitis due to pollen: Secondary | ICD-10-CM | POA: Diagnosis not present

## 2020-08-13 ENCOUNTER — Telehealth: Payer: Self-pay | Admitting: Gastroenterology

## 2020-08-13 DIAGNOSIS — Z789 Other specified health status: Secondary | ICD-10-CM | POA: Diagnosis not present

## 2020-08-13 DIAGNOSIS — Z87898 Personal history of other specified conditions: Secondary | ICD-10-CM | POA: Diagnosis not present

## 2020-08-13 DIAGNOSIS — K862 Cyst of pancreas: Secondary | ICD-10-CM | POA: Diagnosis not present

## 2020-08-13 DIAGNOSIS — R1084 Generalized abdominal pain: Secondary | ICD-10-CM | POA: Diagnosis not present

## 2020-08-13 NOTE — Telephone Encounter (Signed)
Hopefully things improve.  She will let us know. Thanks. GM

## 2020-08-13 NOTE — Telephone Encounter (Signed)
The pt has some left upper abd pain with a raised rash that is on the left flank/back.  She was advised to call her PCP for eval.  She has had the shingles vaccine.  She was told to call our office back if the PCP believes this to be GI related.  FYI Dr Rush Landmark

## 2020-08-13 NOTE — Telephone Encounter (Signed)
Inbound call from patient requesting a call back please.  Has an appt scheduled for 09/11/20 but she has been experiencing ULQ pain and has some lesions.  Please advise.

## 2020-08-14 ENCOUNTER — Other Ambulatory Visit (INDEPENDENT_AMBULATORY_CARE_PROVIDER_SITE_OTHER): Payer: Self-pay | Admitting: Family Medicine

## 2020-08-14 ENCOUNTER — Other Ambulatory Visit: Payer: Self-pay | Admitting: Family Medicine

## 2020-08-14 DIAGNOSIS — R1084 Generalized abdominal pain: Secondary | ICD-10-CM | POA: Diagnosis not present

## 2020-08-14 DIAGNOSIS — R7303 Prediabetes: Secondary | ICD-10-CM

## 2020-08-14 NOTE — Telephone Encounter (Signed)
ok 

## 2020-08-14 NOTE — Telephone Encounter (Signed)
Next appt is in May.

## 2020-08-18 DIAGNOSIS — J301 Allergic rhinitis due to pollen: Secondary | ICD-10-CM | POA: Diagnosis not present

## 2020-08-18 DIAGNOSIS — J3089 Other allergic rhinitis: Secondary | ICD-10-CM | POA: Diagnosis not present

## 2020-08-22 DIAGNOSIS — Z87898 Personal history of other specified conditions: Secondary | ICD-10-CM | POA: Diagnosis not present

## 2020-08-22 DIAGNOSIS — R109 Unspecified abdominal pain: Secondary | ICD-10-CM | POA: Diagnosis not present

## 2020-08-22 DIAGNOSIS — K8020 Calculus of gallbladder without cholecystitis without obstruction: Secondary | ICD-10-CM | POA: Diagnosis not present

## 2020-08-22 DIAGNOSIS — K862 Cyst of pancreas: Secondary | ICD-10-CM | POA: Diagnosis not present

## 2020-08-26 DIAGNOSIS — J3089 Other allergic rhinitis: Secondary | ICD-10-CM | POA: Diagnosis not present

## 2020-08-26 DIAGNOSIS — J301 Allergic rhinitis due to pollen: Secondary | ICD-10-CM | POA: Diagnosis not present

## 2020-09-01 DIAGNOSIS — J301 Allergic rhinitis due to pollen: Secondary | ICD-10-CM | POA: Diagnosis not present

## 2020-09-01 DIAGNOSIS — J3089 Other allergic rhinitis: Secondary | ICD-10-CM | POA: Diagnosis not present

## 2020-09-09 NOTE — Progress Notes (Signed)
[place] Phone: [phone] Subjective:   [name], am serving as a scribe for Dr. Hulan Saas. This visit occurred during the SARS-CoV-2 public health emergency.  Safety protocols were in place, including screening questions prior to the visit, additional usage of staff PPE, and extensive cleaning of exam room while observing appropriate contact time as indicated for disinfecting solutions.   I'm seeing this patient by the request  of:  Isaac Bliss, Rayford Halsted, MD  CC: right hip pain   [id number]   06/12/2020 Continue to work on weight loss.  Discussed with patient greater than 33 minutes today of all her ailments.  Right hip pain overall.  Patient does have some limited range of motion with internal range of motion.  Could be more likely arthritic changes.  We will get x-ray to further evaluate.  Change exercises a little bit for more stability.  Patient is doing well overall and follow-up with me again 3 to 4 months  Improved after epidural, doing well, encouraged weight loss patient will continue with conservative therapy.  We will continue the gabapentin at the low dose and we will not increase at this time.  Follow-up with me again in 3 to 4 months  Still doing well, no need for another injection continue conservative therapy    Update 09/10/2020 BUFFEY ZABINSKI is a 75 y.o. female coming in with complaint of right hip, left knee and LBP. Patient states that she is having pain in both knees. Was recently at the beach and this caused increase in her pain. History of TKR right knee. Pain over medial aspect of both knees. Has been walking 1.5 miles day.   Patient states that her back pain has improved since epidural in Nov.  Patient notes having a lot of gall stones and is curious if this causes knee pain. 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 Patient massaged area for 2 weeks and this went away.    Would like to speak with another provider at the [facility].      right hip minimal arthritis but does have a mild calcificaiton extra-articular it appears   Past Medical History:  Diagnosis Date  . Anemia   . Anxiety   . Asthma   . Back pain   . BPPV (benign paroxysmal positional vertigo)   . Chronic allergic conjunctivitis   . Depression   . Diverticulosis   . Fibromyalgia   . Gallbladder problem   . Gallstones   . GERD (gastroesophageal reflux disease)   . Hiatal hernia   . History of bladder stone   . Iron (Fe) deficiency anemia   . Joint pain   . Lump in female breast   . Mild persistent asthma    followed by pcp--- dr r. sharma ([place] allergy/asthma)  . OA (osteoarthritis)    knees, hands  . OAB (overactive bladder)   . OSA (obstructive sleep apnea)    per pt has not used cpap several years  . Pancreatic cyst   . PONV (postoperative nausea and vomiting)   . RLS (restless legs syndrome)   . Seasonal and perennial allergic rhinitis   . SUI (stress urinary incontinence, female)   . Swallowing difficulty   . Vitamin B12 deficiency   . Vitamin D deficiency   . Wears glasses    Past Surgical History:  Procedure Laterality Date  . BLADDER SUSPENSION  1990's   sling  .  bladder tacking  2010   with mesh  . BREAST LUMPECTOMY Left 1985   Benign cyst  . BREAST REDUCTION SURGERY Bilateral 2003 approx.  . CORNEA LESION EXCISION Right 2005  approx.  . CYSTOSCOPY N/A 07/12/2016   Procedure: CYSTOSCOPY, VAGINOSCOPY, EXAM UNDER ANESTHESIA, STONE LITHOTRIPSY,;  Surgeon: Cleon Gustin, MD;  Location[facility];  Service: Urology;  Laterality: N/A;  . CYSTOSCOPY N/A 10/11/2016   Procedure: CYSTOSCOPY;  Surgeon: Cleon Gustin, MD;  Location[facility];  Service: Urology;  Laterality: N/A;  . CYSTOSCOPY WITH LITHOLAPAXY N/A 10/11/2016   Procedure: CYSTOSCOPY WITH LITHOLAPAXY/ EXCISION OF MESH;  Surgeon:  Cleon Gustin, MD;  Location: [facility];  Service: Urology;  Laterality: N/A;  . CYSTOSCOPY WITH LITHOLAPAXY N/A 01/22/2019   Procedure: CYSTOSCOPY WITH LITHOLAPAXY;  Surgeon: Cleon Gustin, MD;  Location: [facility];  Service: Urology;  Laterality: N/A;  1 HR  . HOLMIUM LASER APPLICATION  4/70/9628   Procedure: HOLMIUM LASER APPLICATION;  Surgeon: Cleon Gustin, MD;  Location: [facility];  Service: Urology;;  . HOLMIUM LASER APPLICATION N/A 3/66/2947   Procedure: HOLMIUM LASER APPLICATION;  Surgeon: Cleon Gustin, MD;  Location: [facility];  Service: Urology;  Laterality: N/A;  . RIGHT KNEE ARTHROSCOPY  2013  . TOTAL KNEE ARTHROPLASTY Right 09/12/2012   Procedure: RIGHT TOTAL KNEE ARTHROPLASTY;  Surgeon: Gearlean Alf, MD;  Location: [facility];  Service: Orthopedics;  Laterality: Right;  Marland Kitchen VAGINAL HYSTERECTOMY  1984   Social History   Socioeconomic History  . Marital status: Widowed    Spouse name: Not on file  . Number of children: Not on file  . Years of education: Not on file  . Highest education level: Not on file  Occupational History  . Occupation: Biochemist, clinical Retired  Tobacco Use  . Smoking status: Never Smoker  . Smokeless tobacco: Never Used  Vaping Use  . Vaping Use: Never used  Substance and Sexual Activity  . Alcohol use: Not Currently  . Drug use: Never  . Sexual activity: Not on file  Other Topics Concern  . Not on file  Social History Narrative  . Not on file   Social Determinants of Health   Financial Resource Strain: Not on file  Food Insecurity: Not on file  Transportation Needs: Not on file  Physical Activity: Not on file  Stress: Not on file  Social Connections: Not on file   Allergies  Allergen Reactions  . Shellfish Allergy Hives  . Cephalosporins Hives  . Hydrochlorothiazide Hives  . Penicillins Hives  . Sulfa Antibiotics Hives   Family  History  Problem Relation Age of Onset  . Kidney cancer Mother   . Diabetes Mother   . Obesity Mother   . Alcoholism Father   . Colon cancer Neg Hx   . Inflammatory bowel disease Neg Hx   . Liver disease Neg Hx   . Pancreatic cancer Neg Hx   . Stomach cancer Neg Hx   . Rectal cancer Neg Hx     Current Outpatient Medications (Endocrine & Metabolic):  .  metFORMIN (GLUCOPHAGE) 500 MG tablet, TAKE 1 TABLET BY MOUTH 2 TIMES DAILY WITH A MEAL.  Current Outpatient Medications (Cardiovascular):  Marland Kitchen  EPINEPHrine 0.3 mg/0.3 mL IJ SOAJ injection, Inject 0.3 mg into the muscle as needed for anaphylaxis. Marland Kitchen  spironolactone (ALDACTONE) 25 MG tablet, TAKE 1 TABLET BY MOUTH EVERY DAY  Current  Outpatient Medications (Respiratory):  .  fluticasone (FLONASE) 50 MCG/ACT nasal spray, Place into both nostrils. Marland Kitchen  levocetirizine (XYZAL) 5 MG tablet, Take 5 mg by mouth every evening.     Current Outpatient Medications (Other):  .  gabapentin (NEURONTIN) 100 MG capsule, TAKE 2 CAPSULES BY MOUTH AT BEDTIME .  Misc Natural Products (TART CHERRY ADVANCED) CAPS, Take 1 capsule by mouth. 1200mg  capsule .  omeprazole (PRILOSEC) 20 MG capsule, Take 1 capsule (20 mg total) by mouth 2 (two) times daily before a meal. (Patient taking differently: Take 20 mg by mouth daily.) .  psyllium (REGULOID) 0.52 g capsule, Take 0.52 g by mouth daily.   Reviewed prior external information including notes and imaging from  primary care provider As well as notes that were available from care everywhere and other healthcare systems.  Past medical history, social, surgical and family history all reviewed in electronic medical record.  No pertanent information unless stated regarding to the chief complaint.   Review of Systems:  No headache, visual changes, nausea, vomiting, diarrhea, constipation, dizziness, abdominal pain, skin rash, fevers, chills, night sweats, weight loss, swollen lymph nodes, body aches, joint swelling,  chest pain, shortness of breath, mood changes. POSITIVE muscle aches  Objective  Blood pressure 112/76, pulse 78, height 5\' 5"  (1.651 m), weight 208 lb (94.3 kg), SpO2 97 %.   General: No apparent distress alert and oriented x3 mood and affect normal, dressed appropriately.  HEENT: Pupils equal, extraocular movements intact  Respiratory: Patient's speak in full sentences and does not appear short of breath  Cardiovascular: No lower extremity edema, non tender, no erythema  Gait normal with good balance and coordination.  MSK: Low back does have some mild loss of lordosis.  Patient does have some knee replacement on the right side.  Tightness noted in the hamstring bilaterally.  Pain more over the pes anserine area bilaterally mild positive Corky Sox.     Impression and Recommendations:     The above documentation has been reviewed and is accurate and complete Lyndal Pulley, DO

## 2020-09-10 ENCOUNTER — Encounter: Payer: Self-pay | Admitting: Family Medicine

## 2020-09-10 ENCOUNTER — Ambulatory Visit (INDEPENDENT_AMBULATORY_CARE_PROVIDER_SITE_OTHER): Payer: PPO | Admitting: Family Medicine

## 2020-09-10 ENCOUNTER — Other Ambulatory Visit: Payer: Self-pay

## 2020-09-10 VITALS — BP 112/76 | HR 78 | Ht 65.0 in | Wt 208.0 lb

## 2020-09-10 DIAGNOSIS — M76899 Other specified enthesopathies of unspecified lower limb, excluding foot: Secondary | ICD-10-CM | POA: Insufficient documentation

## 2020-09-10 DIAGNOSIS — J301 Allergic rhinitis due to pollen: Secondary | ICD-10-CM | POA: Diagnosis not present

## 2020-09-10 DIAGNOSIS — M25562 Pain in left knee: Secondary | ICD-10-CM | POA: Diagnosis not present

## 2020-09-10 DIAGNOSIS — E6609 Other obesity due to excess calories: Secondary | ICD-10-CM | POA: Diagnosis not present

## 2020-09-10 DIAGNOSIS — M1712 Unilateral primary osteoarthritis, left knee: Secondary | ICD-10-CM | POA: Diagnosis not present

## 2020-09-10 DIAGNOSIS — G8929 Other chronic pain: Secondary | ICD-10-CM

## 2020-09-10 DIAGNOSIS — Z6832 Body mass index (BMI) 32.0-32.9, adult: Secondary | ICD-10-CM

## 2020-09-10 DIAGNOSIS — M25561 Pain in right knee: Secondary | ICD-10-CM

## 2020-09-10 DIAGNOSIS — J3089 Other allergic rhinitis: Secondary | ICD-10-CM | POA: Diagnosis not present

## 2020-09-10 DIAGNOSIS — M545 Low back pain, unspecified: Secondary | ICD-10-CM | POA: Diagnosis not present

## 2020-09-10 NOTE — Patient Instructions (Addendum)
[place] [phone] [facility]: Dr. Raliegh Scarlet or [facility] [place] [facility]-they will call you Hamstring exercises [place] See me again in 2 months

## 2020-09-10 NOTE — Assessment & Plan Note (Signed)
distal.  Discussed topical anti-inflammatories.  Discussed that I do believe there is some underlying arthritis is likely contributing as well.  Discussed home exercise and icing regimen.  Follow-up again in 4 to 8 weeks Start formal physical therapy and see how patient does.

## 2020-09-10 NOTE — Assessment & Plan Note (Signed)
Low back pain continues to do relatively well after the epidural in November.  Patient wants to be more active.  We will get her in with formal physical therapy for this as well as some hamstring tendinopathy it appears she is having.  Does have known arthritic changes of the left knee.  We will continue to monitor but worsening pain consider injection.  Follow-up with me again in 2 to 3 months

## 2020-09-10 NOTE — Assessment & Plan Note (Signed)
Patient's last injection was quite some time ago.  Can repeat.  Discussed potential viscosupplementation.  Do believe that patient is having some hamstring tendinopathy bilaterally.  Will consider the possibility of compression sleeves but I do feel that formal physical therapy would be more beneficial.

## 2020-09-11 ENCOUNTER — Ambulatory Visit (INDEPENDENT_AMBULATORY_CARE_PROVIDER_SITE_OTHER): Payer: PPO | Admitting: Gastroenterology

## 2020-09-11 ENCOUNTER — Encounter: Payer: Self-pay | Admitting: Gastroenterology

## 2020-09-11 VITALS — BP 118/80 | HR 82 | Ht 65.0 in | Wt 204.1 lb

## 2020-09-11 DIAGNOSIS — K219 Gastro-esophageal reflux disease without esophagitis: Secondary | ICD-10-CM

## 2020-09-11 DIAGNOSIS — K8020 Calculus of gallbladder without cholecystitis without obstruction: Secondary | ICD-10-CM

## 2020-09-11 DIAGNOSIS — R1012 Left upper quadrant pain: Secondary | ICD-10-CM | POA: Diagnosis not present

## 2020-09-11 DIAGNOSIS — D490 Neoplasm of unspecified behavior of digestive system: Secondary | ICD-10-CM

## 2020-09-11 DIAGNOSIS — K8080 Other cholelithiasis without obstruction: Secondary | ICD-10-CM

## 2020-09-11 NOTE — Progress Notes (Signed)
[place] VISIT   Primary Care Provider Isaac Bliss, Rayford Halsted, MD [place] [address] [phone]  Patient Profile: Cassandra [name] is a 75 y.o. female with a pmh significant for fibromyalgia, generalized anxiety, bladder stones, asthma, sleep apnea, BPPV, restless leg syndrome, GERD, hiatal hernia, cholelithiasis, diverticulosis, pancreatic cyst (BD-IPMN).  The patient presents to the [facility] for an evaluation and management of problem(s) noted below:  Problem List 1. Gastroesophageal reflux disease without esophagitis   2. LUQ pain   3. Calculus of gallbladder without cholecystitis without obstruction   4. IPMN (intraductal papillary mucinous neoplasm)     History of Present Illness Please see initial consultation note and prior progress notes for full details of HPI.    Interval History The patient returns for scheduled follow-up.  The patient seems to be doing well at this time.  She remains on her current PPI dosing.  Has not had episodes, only 2 or 3, where she felt she was going to have bad acid reflux but did not end up having that.  No dysphagia.  She feels that stress and anxiety have played a significant role in her symptoms.  Things seem to be stabilizing.  She is not sure she wants to come off PPI therapy.  She continues to have difficulty with weight loss but is going to be seeing a new provider to see if that helps with further weight loss goals.  She is occasionally having left upper quadrant abdominal discomfort that comes and goes.  It is not preventing her from doing any of her daily activities.  GI Review of Systems Positive as above Negative for dysphagia, odynophagia, vomiting, nausea, change in bowel habits, melena, hematochezia   Review of Systems General: Denies fevers/chills/unintentional weight loss Cardiovascular: Denies chest pain Pulmonary: Denies shortness of breath/nocturnal  cough Gastroenterological: See HPI Genitourinary: Denies darkened urine Hematological: Denies easy bruising/bleeding Dermatological: Denies jaundice Psychological: Mood is stable   Medications Current Outpatient Medications  Medication Sig Dispense Refill  . EPINEPHrine 0.3 mg/0.3 mL IJ SOAJ injection Inject 0.3 mg into the muscle as needed for anaphylaxis.    . fluticasone (FLONASE) 50 MCG/ACT nasal spray Place into both nostrils.    Marland Kitchen gabapentin (NEURONTIN) 100 MG capsule TAKE 2 CAPSULES BY MOUTH AT BEDTIME 180 capsule 0  . levocetirizine (XYZAL) 5 MG tablet Take 5 mg by mouth every evening.   1  . metFORMIN (GLUCOPHAGE) 500 MG tablet TAKE 1 TABLET BY MOUTH 2 TIMES DAILY WITH A MEAL. 180 tablet 0  . Misc Natural Products (TART CHERRY ADVANCED) CAPS Take 1 capsule by mouth. 1200mg  capsule    . omeprazole (PRILOSEC) 20 MG capsule Take 20 mg by mouth 2 (two) times daily before a meal.    . psyllium (REGULOID) 0.52 g capsule Take 0.52 g by mouth daily.    Marland Kitchen spironolactone (ALDACTONE) 25 MG tablet TAKE 1 TABLET BY MOUTH EVERY DAY 90 tablet 1   No current facility-administered medications for this visit.    Allergies Allergies  Allergen Reactions  . Shellfish Allergy Hives  . Cephalosporins Hives  . Hydrochlorothiazide Hives  . Penicillins Hives  . Sulfa Antibiotics Hives    Histories Past Medical History:  Diagnosis Date  . Anemia   . Anxiety   . Asthma   . Back pain   . BPPV (benign paroxysmal positional vertigo)   . Chronic allergic conjunctivitis   . Depression   . Diverticulosis   .  Fibromyalgia   . Gallbladder problem   . Gallstones   . GERD (gastroesophageal reflux disease)   . Hiatal hernia   . History of bladder stone   . Iron (Fe) deficiency anemia   . Joint pain   . Lump in female breast   . Mild persistent asthma    followed by pcp--- dr r. sharma ([place] allergy/asthma)  . OA (osteoarthritis)    knees, hands  . OAB (overactive bladder)   . OSA  (obstructive sleep apnea)    per pt has not used cpap several years  . Pancreatic cyst   . PONV (postoperative nausea and vomiting)   . RLS (restless legs syndrome)   . Seasonal and perennial allergic rhinitis   . SUI (stress urinary incontinence, female)   . Swallowing difficulty   . Vitamin B12 deficiency   . Vitamin D deficiency   . Wears glasses    Past Surgical History:  Procedure Laterality Date  . BLADDER SUSPENSION  1990's   sling  . bladder tacking  2010   with mesh  . BREAST LUMPECTOMY Left 1985   Benign cyst  . BREAST REDUCTION SURGERY Bilateral 2003 approx.  . CORNEA LESION EXCISION Right 2005  approx.  . CYSTOSCOPY N/A 07/12/2016   Procedure: CYSTOSCOPY, VAGINOSCOPY, EXAM UNDER ANESTHESIA, STONE LITHOTRIPSY,;  Surgeon: Cleon Gustin, MD;  Location: [facility];  Service: Urology;  Laterality: N/A;  . CYSTOSCOPY N/A 10/11/2016   Procedure: CYSTOSCOPY;  Surgeon: Cleon Gustin, MD;  Location[facility];  Service: Urology;  Laterality: N/A;  . CYSTOSCOPY WITH LITHOLAPAXY N/A 10/11/2016   Procedure: CYSTOSCOPY WITH LITHOLAPAXY/ EXCISION OF MESH;  Surgeon: Cleon Gustin, MD;  Location: [facility];  Service: Urology;  Laterality: N/A;  . CYSTOSCOPY WITH LITHOLAPAXY N/A 01/22/2019   Procedure: CYSTOSCOPY WITH LITHOLAPAXY;  Surgeon: Cleon Gustin, MD;  Location: [facility];  Service: Urology;  Laterality: N/A;  1 HR  . HOLMIUM LASER APPLICATION  01/22/538   Procedure: HOLMIUM LASER APPLICATION;  Surgeon: Cleon Gustin, MD;  Location: [facility];  Service: Urology;;  . HOLMIUM LASER APPLICATION N/A 7/67/3419   Procedure: HOLMIUM LASER APPLICATION;  Surgeon: Cleon Gustin, MD;  Location: [facility];  Service: Urology;  Laterality: N/A;  . RIGHT KNEE ARTHROSCOPY  2013  . TOTAL KNEE ARTHROPLASTY Right 09/12/2012   Procedure: RIGHT TOTAL KNEE ARTHROPLASTY;   Surgeon: Gearlean Alf, MD;  Location: [facility];  Service: Orthopedics;  Laterality: Right;  Marland Kitchen VAGINAL HYSTERECTOMY  1984   Social History   Socioeconomic History  . Marital status: Widowed    Spouse name: Not on file  . Number of children: Not on file  . Years of education: Not on file  . Highest education level: Not on file  Occupational History  . Occupation: Biochemist, clinical Retired  Tobacco Use  . Smoking status: Never Smoker  . Smokeless tobacco: Never Used  Vaping Use  . Vaping Use: Never used  Substance and Sexual Activity  . Alcohol use: Not Currently  . Drug use: Never  . Sexual activity: Not on file  Other Topics Concern  . Not on file  Social History Narrative  . Not on file   Social Determinants of Health   Financial Resource Strain: Not on file  Food Insecurity: Not on file  Transportation Needs: Not on file  Physical Activity: Not on file  Stress: Not on file  Social  Connections: Not on file  Intimate Partner Violence: Not on file   Family History  Problem Relation Age of Onset  . Kidney cancer Mother   . Diabetes Mother   . Obesity Mother   . Alcoholism Father   . Colon cancer Neg Hx   . Inflammatory bowel disease Neg Hx   . Liver disease Neg Hx   . Pancreatic cancer Neg Hx   . Stomach cancer Neg Hx   . Rectal cancer Neg Hx    I have reviewed her medical, social, and family history in detail and updated the electronic medical record as necessary.    PHYSICAL EXAMINATION  BP 118/80 (BP Location: Left Arm, Patient Position: Sitting, Cuff Size: Normal)   Pulse 82   Ht 5\' 5"  (1.651 m)   Wt 204 lb 2 oz (92.6 kg)   BMI 33.97 kg/m  Wt Readings from Last 3 Encounters:  09/11/20 204 lb 2 oz (92.6 kg)  09/10/20 208 lb (94.3 kg)  07/18/20 203 lb (92.1 kg)  GEN: NAD, appears stated age, doesn't appear chronically ill PSYCH: Cooperative, without pressured speech EYE: Conjunctivae pink, sclerae anicteric ENT: Masked CV:  Nontachycardic RESP: No audible wheezing GI: NABS, soft, protuberant, nontender, without rebound or guarding MSK/EXT: No lower extremity edema SKIN: No jaundice NEURO:  Alert & Oriented x 3, no focal deficits   REVIEW OF DATA  I reviewed the following data at the time of this encounter:  GI Procedures and Studies  Previously reviewed  Laboratory Studies  Reviewed those in epic  Imaging Studies  No new imaging to review   ASSESSMENT  Ms. Ponzo is a 75 y.o. female with a pmh significant for fibromyalgia, generalized anxiety, bladder stones, asthma, sleep apnea, BPPV, restless leg syndrome, GERD, hiatal hernia, cholelithiasis, diverticulosis, pancreatic cyst (BD-IPMN).  The patient is seen today for evaluation and management of:  1. Gastroesophageal reflux disease without esophagitis   2. LUQ pain   3. Calculus of gallbladder without cholecystitis without obstruction   4. IPMN (intraductal papillary mucinous neoplasm)    Overall, the patient is hemodynamically stable.  Clinically she is not having significant dysphagia symptoms at this time.  Her pyrosis symptoms have improved when she has returned back to PPI therapy.  We will continue it at this time.  She does not feel like she is having significant dysphagia symptoms currently.  We will hold on esophageal manometry and consider repeat empiric dilation up to 19 mm in future if necessary.  We will continue her on twice daily PPI for now.  No plan to come off PPI therapy right now.  If she is having significant issues she can use Gaviscon as needed.  Not sure that cholelithiasis is causing her any issues at this time.  Plan for repeat IPMN surveillance in January 2023 (2 years from her prior imaging).  If there are significant changes in the pancreatic cyst we will consider the role of endoscopic ultrasound fine-needle aspiration/biopsy.  I will see her back in follow-up in 4 to 6 months depending on how she is doing she can be seen sooner  if necessary.  All patient questions were answered to the best of my ability, and the patient agrees to the aforementioned plan of action with follow-up as indicated.   PLAN  MRI/MRCP January 2023 (no FNA/FNB unless significant increase in size, size greater than 2.5 to 3 cm, or other red flag symptoms develop) Continue omeprazole 20 twice daily As needed Gaviscon if symptoms significant  while on current PPI therapy Holding on manometry or empiric dilation to 19 mm See how she does with weight loss goals Holding on pH impedance testing Colonoscopy in 2025   No orders of the defined types were placed in this encounter.   New Prescriptions   No medications on file   Modified Medications   No medications on file    Planned Follow Up No follow-ups on file.   Total Time in Face-to-Face and in Coordination of Care for patient including independent/personal interpretation/review of prior testing, medical history, examination, medication adjustment, communicating results with the patient directly, and documentation with the EHR is 25 minutes.   Justice Britain, MD [facility] Office # [phone]

## 2020-09-11 NOTE — Patient Instructions (Signed)
Continue Omeprazole 20mg - Twice daily.   Use Gaviscon as needed.   We will see you back in 6 months.   If you are age 74 or older, your body mass index should be between 23-30. Your Body mass index is 33.97 kg/m. If this is out of the aforementioned range listed, please consider follow up with your Primary Care Provider.  If you are age 19 or younger, your body mass index should be between 19-25. Your Body mass index is 33.97 kg/m. If this is out of the aformentioned range listed, please consider follow up with your Primary Care Provider.    Thank you for choosing me and [facility].  Dr. Rush Landmark

## 2020-09-15 ENCOUNTER — Encounter: Payer: Self-pay | Admitting: Gastroenterology

## 2020-09-15 DIAGNOSIS — R1012 Left upper quadrant pain: Secondary | ICD-10-CM | POA: Insufficient documentation

## 2020-09-15 DIAGNOSIS — K8020 Calculus of gallbladder without cholecystitis without obstruction: Secondary | ICD-10-CM | POA: Insufficient documentation

## 2020-09-17 DIAGNOSIS — J301 Allergic rhinitis due to pollen: Secondary | ICD-10-CM | POA: Diagnosis not present

## 2020-09-17 DIAGNOSIS — D225 Melanocytic nevi of trunk: Secondary | ICD-10-CM | POA: Diagnosis not present

## 2020-09-17 DIAGNOSIS — D2371 Other benign neoplasm of skin of right lower limb, including hip: Secondary | ICD-10-CM | POA: Diagnosis not present

## 2020-09-17 DIAGNOSIS — L281 Prurigo nodularis: Secondary | ICD-10-CM | POA: Diagnosis not present

## 2020-09-17 DIAGNOSIS — D1801 Hemangioma of skin and subcutaneous tissue: Secondary | ICD-10-CM | POA: Diagnosis not present

## 2020-09-17 DIAGNOSIS — S20461A Insect bite (nonvenomous) of right back wall of thorax, initial encounter: Secondary | ICD-10-CM | POA: Diagnosis not present

## 2020-09-17 DIAGNOSIS — J3089 Other allergic rhinitis: Secondary | ICD-10-CM | POA: Diagnosis not present

## 2020-09-18 ENCOUNTER — Encounter: Payer: Self-pay | Admitting: Internal Medicine

## 2020-09-24 DIAGNOSIS — J301 Allergic rhinitis due to pollen: Secondary | ICD-10-CM | POA: Diagnosis not present

## 2020-09-24 DIAGNOSIS — J3089 Other allergic rhinitis: Secondary | ICD-10-CM | POA: Diagnosis not present

## 2020-09-29 ENCOUNTER — Encounter (INDEPENDENT_AMBULATORY_CARE_PROVIDER_SITE_OTHER): Payer: Self-pay | Admitting: Family Medicine

## 2020-09-29 ENCOUNTER — Ambulatory Visit (INDEPENDENT_AMBULATORY_CARE_PROVIDER_SITE_OTHER): Payer: PPO | Admitting: Family Medicine

## 2020-09-29 ENCOUNTER — Other Ambulatory Visit: Payer: Self-pay

## 2020-09-29 VITALS — BP 123/76 | HR 79 | Temp 98.0°F | Ht 65.0 in | Wt 196.0 lb

## 2020-09-29 DIAGNOSIS — Z6832 Body mass index (BMI) 32.0-32.9, adult: Secondary | ICD-10-CM | POA: Diagnosis not present

## 2020-09-29 DIAGNOSIS — G4733 Obstructive sleep apnea (adult) (pediatric): Secondary | ICD-10-CM | POA: Diagnosis not present

## 2020-09-29 DIAGNOSIS — Z9989 Dependence on other enabling machines and devices: Secondary | ICD-10-CM | POA: Diagnosis not present

## 2020-09-29 DIAGNOSIS — R7303 Prediabetes: Secondary | ICD-10-CM

## 2020-09-29 DIAGNOSIS — E669 Obesity, unspecified: Secondary | ICD-10-CM | POA: Diagnosis not present

## 2020-09-29 DIAGNOSIS — E66811 Obesity, class 1: Secondary | ICD-10-CM

## 2020-09-29 MED ORDER — METFORMIN HCL 500 MG PO TABS: 1.0000 | ORAL_TABLET | Freq: Two times a day (BID) | ORAL | 0 refills | Status: DC

## 2020-09-30 ENCOUNTER — Ambulatory Visit (HOSPITAL_BASED_OUTPATIENT_CLINIC_OR_DEPARTMENT_OTHER): Payer: PPO | Attending: Family Medicine | Admitting: Physical Therapy

## 2020-09-30 ENCOUNTER — Encounter (HOSPITAL_BASED_OUTPATIENT_CLINIC_OR_DEPARTMENT_OTHER): Payer: Self-pay | Admitting: Physical Therapy

## 2020-09-30 DIAGNOSIS — M25562 Pain in left knee: Secondary | ICD-10-CM | POA: Diagnosis not present

## 2020-09-30 DIAGNOSIS — M25561 Pain in right knee: Secondary | ICD-10-CM | POA: Diagnosis not present

## 2020-09-30 DIAGNOSIS — J301 Allergic rhinitis due to pollen: Secondary | ICD-10-CM | POA: Diagnosis not present

## 2020-09-30 DIAGNOSIS — G8929 Other chronic pain: Secondary | ICD-10-CM | POA: Insufficient documentation

## 2020-09-30 DIAGNOSIS — J3089 Other allergic rhinitis: Secondary | ICD-10-CM | POA: Diagnosis not present

## 2020-09-30 NOTE — Therapy (Signed)
[facility] [address] [place], [place], [address] Phone: [phone]   Fax:  [phone]  Physical Therapy Evaluation  Patient Details  Name: Cassandra [name] MRN: [id number] Date of Birth: 1945-07-11 Referring Provider (PT): Charlann Boxer, DO   Encounter Date: 09/30/2020   PT End of Session - 09/30/20 1150    Visit Number 1    Number of Visits 21    Date for PT Re-Evaluation 12/12/20    Authorization Type [employer]    PT Start Time 1147    PT Stop Time 1227    PT Time Calculation (min) 40 min    Activity Tolerance Patient tolerated treatment well    Behavior During Therapy [facility] for tasks assessed/performed           Past Medical History:  Diagnosis Date  . Anemia   . Anxiety   . Asthma   . Back pain   . BPPV (benign paroxysmal positional vertigo)   . Chronic allergic conjunctivitis   . Depression   . Diverticulosis   . Fibromyalgia   . Gallbladder problem   . Gallstones   . GERD (gastroesophageal reflux disease)   . Hiatal hernia   . History of bladder stone   . Iron (Fe) deficiency anemia   . Joint pain   . Lump in female breast   . Mild persistent asthma    followed by pcp--- dr r. sharma ([place] allergy/asthma)  . OA (osteoarthritis)    knees, hands  . OAB (overactive bladder)   . OSA (obstructive sleep apnea)    per pt has not used cpap several years  . Pancreatic cyst   . PONV (postoperative nausea and vomiting)   . RLS (restless legs syndrome)   . Seasonal and perennial allergic rhinitis   . SUI (stress urinary incontinence, female)   . Swallowing difficulty   . Vitamin B12 deficiency   . Vitamin D deficiency   . Wears glasses     Past Surgical History:  Procedure Laterality Date  . BLADDER SUSPENSION  1990's   sling  . bladder tacking  2010   with mesh  . BREAST LUMPECTOMY Left 1985   Benign cyst  . BREAST REDUCTION SURGERY Bilateral 2003 approx.  . CORNEA LESION EXCISION  Right 2005  approx.  . CYSTOSCOPY N/A 07/12/2016   Procedure: CYSTOSCOPY, VAGINOSCOPY, EXAM UNDER ANESTHESIA, STONE LITHOTRIPSY,;  Surgeon: Cleon Gustin, MD;  Location: [facility];  Service: Urology;  Laterality: N/A;  . CYSTOSCOPY N/A 10/11/2016   Procedure: CYSTOSCOPY;  Surgeon: Cleon Gustin, MD;  Location: [facility];  Service: Urology;  Laterality: N/A;  . CYSTOSCOPY WITH LITHOLAPAXY N/A 10/11/2016   Procedure: CYSTOSCOPY WITH LITHOLAPAXY/ EXCISION OF MESH;  Surgeon: Cleon Gustin, MD;  Location: [facility];  Service: Urology;  Laterality: N/A;  . CYSTOSCOPY WITH LITHOLAPAXY N/A 01/22/2019   Procedure: CYSTOSCOPY WITH LITHOLAPAXY;  Surgeon: Cleon Gustin, MD;  Location: [facility];  Service: Urology;  Laterality: N/A;  1 HR  . HOLMIUM LASER APPLICATION  8/67/5449   Procedure: HOLMIUM LASER APPLICATION;  Surgeon: Cleon Gustin, MD;  Location: [facility];  Service: Urology;;  . HOLMIUM LASER APPLICATION N/A 07/01/69   Procedure: HOLMIUM LASER APPLICATION;  Surgeon: Cleon Gustin, MD;  Location: [facility];  Service: Urology;  Laterality: N/A;  . RIGHT KNEE ARTHROSCOPY  2013  . TOTAL KNEE ARTHROPLASTY Right 09/12/2012  Procedure: RIGHT TOTAL KNEE ARTHROPLASTY;  Surgeon: Gearlean Alf, MD;  Location: [facility];  Service: Orthopedics;  Laterality: Right;  Marland Kitchen VAGINAL HYSTERECTOMY  1984    There were no vitals filed for this visit.    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 Fell down stairs and tore meniscus. Ended up having Rt TKA in 2014 by Dr Maureen Ralphs. I think if I can lose weight I will feel better. Having a hard time losing weight and trying new weight loss MD soon. My back is feeling better today. I love to walk but my knees hurt. The backs of my  legs feel like they are drawing up on me. I have an upright bike at home that I use some.    Patient Stated Goals lose weight    Currently in Pain? No/denies    Aggravating Factors  weight bearing    Pain Relieving Factors rest              [facility] PT Assessment - 09/30/20 0001      Assessment   Medical Diagnosis chronic knee pain, bilat    Referring Provider (PT) Charlann Boxer, DO      Precautions   Precautions None      Restrictions   Weight Bearing Restrictions No      Balance Screen   Has the patient fallen in the past 6 months No      [place] residence      Prior Function   Level of Independence Independent      Sensation   Additional Comments [facility]      Posture/Postural Control   Posture Comments inominate rotation with functional LLD                      Objective measurements completed on examination: See above findings.       [place] Adult PT Treatment/Exercise - 09/30/20 0001      Exercises   Exercises Knee/Hip      Knee/Hip Exercises: Stretches   Passive Hamstring Stretch Limitations seated      Knee/Hip Exercises: Supine   Other Supine Knee/Hip Exercises MET Rt flexors/Lt extensors                  PT Education - 09/30/20 1941    Education Details anatomy of condition, POC, HEP, exercise form/rationale    Person(s) Educated Patient    Methods Explanation;Demonstration;Tactile cues;Verbal cues;Handout    Comprehension Verbalized understanding;Need further instruction;Returned demonstration;Verbal cues required;Tactile cues required            PT Short Term Goals - 09/30/20 1944      PT SHORT TERM GOAL #1   Title neutral innominate rotation    Baseline Rt post at eval    Time 3    Period Weeks    Status New    Target Date 10/24/20             PT Long Term Goals - 09/30/20 1945      PT LONG TERM GOAL #1   Title pt will be on path to use of exercise for weight loss     Baseline has not had success with diet so far    Time 10    Period Weeks    Status New  Target Date 12/12/20      PT LONG TERM GOAL #2   Title able to sleep without "drawing up" of hamstrings    Baseline unable at eval    Time 10    Period Weeks    Status New    Target Date 12/12/20      PT LONG TERM GOAL #3   Title pt will demo gross LE strength 5/5    Baseline not appropriate to test at eval due to notable rotation    Time 10    Period Weeks    Status New    Target Date 12/12/20                  Plan - 09/30/20 1211    Clinical Impression Statement Pt presents to PT with complaints of bil knee pain and LBP. Noted innominate rotation with functional LLD corrected with MET today which was helpful to her back. Would like to use aquatic exercise as a last resort. Will benefit from skilled PT in order to improve gross flexibility and lumbopelvic stability to meet LTGs.    Personal Factors and Comorbidities Comorbidity 3+    Comorbidities Rt TKA with cont pain, chronic back pain, RLS, hiatal hernia    Examination-Activity Limitations Bathing;Sit;Sleep;Bed Mobility;Bend;Lift;Squat;Stairs;Stand    Examination-Participation Restrictions Community Activity;Other;Driving;Meal Prep;Shop    Stability/Clinical Decision Making Stable/Uncomplicated    Clinical Decision Making Low    Rehab Potential Good    PT Frequency 2x / week    PT Duration Other (comment)   10 wks   PT Treatment/Interventions ADLs/Self Care Home Management;Moist Heat;Iontophoresis 32m/ml Dexamethasone;Electrical Stimulation;Cryotherapy;Traction;Therapeutic activities;Functional mobility training;Stair training;Gait training;Therapeutic exercise;Balance training;Neuromuscular re-education;Patient/family education;Manual techniques;Taping;Dry needling;Passive range of motion    PT Next Visit Plan recheck innom rotation, lumbopelvic strengthening, IASTM to hamstrings    PT Home Exercise Plan [id number]    Consulted  and Agree with Plan of Care Patient           Patient will benefit from skilled therapeutic intervention in order to improve the following deficits and impairments:  Difficulty walking,Increased muscle spasms,Improper body mechanics,Decreased activity tolerance,Impaired flexibility,Postural dysfunction,Pain,Decreased strength  Visit Diagnosis: Chronic pain of right knee - Plan: PT plan of care cert/re-cert  Chronic pain of left knee - Plan: PT plan of care cert/re-cert     Problem List Patient Active Problem List   Diagnosis Date Noted  . LUQ pain 09/15/2020  . Calculus of gallbladder without cholecystitis without obstruction 09/15/2020  . Hamstring tendinitis 09/10/2020  . Pyrosis 07/21/2020  . 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  . Right hip pain 06/12/2020  . Pre-diabetes 03/12/2020  . Seasonal allergies 03/12/2020  . Obesity 10/30/2019  . Low back pain 09/11/2019  . Degenerative joint disease of knee, left 09/11/2019  . Pancreatic cyst 01/25/2019  . Gastroesophageal reflux disease 01/25/2019  . Esophageal dysphagia 01/25/2019  . Unintentional weight loss 01/25/2019  . IGT (impaired glucose tolerance) 01/17/2019  . BPPV (benign paroxysmal positional vertigo) 10/23/2012  . Vertigo 10/21/2012  . UTI (lower urinary tract infection) 10/21/2012  . Nausea & vomiting 10/21/2012  . Postoperative anemia due to acute blood loss 09/14/2012  . OA (osteoarthritis) of knee 09/12/2012  . Insomnia 08/20/2011  . OBSTRUCTIVE SLEEP APNEA 04/23/2008  . RESTLESS LEG SYNDROME 04/23/2008  . FIBROMYALGIA 01/06/2007     C.  PT, DPT 09/30/20 8:03 PM   [facility] [address] [place] [address]Phone: [phone]  Fax:  [phone]  Name: GLORIANA PILTZ MRN: [id number] Date of Birth: [birth year]

## 2020-09-30 NOTE — Progress Notes (Signed)
Chief Complaint:   OBESITY Cassandra [name] is here to discuss her progress with her obesity treatment plan along with follow-up of her obesity related diagnoses. Cassandra [name] is on practicing portion control and making smarter food choices, such as increasing vegetables and decreasing simple carbohydrates and states she is following her eating plan approximately 80-85% of the time. Cassandra [name] states she is walking for 30-45 minutes 5 times per week.  Today's visit was #: 13 Starting weight: 193 lbs Starting date: 11/13/2019 Today's weight: 196 lbs Today's date: 09/29/2020 Total lbs lost to date: 0 Total lbs lost since last in-office visit: 4  Interim History: Cassandra [name] has done better with her weight loss in the last 3 months. She has struggled to follow any of our structured plans and she did not journal well, so she is just trying to portion control and make smart choices. She is eating more beets and brussel sprouts. She has seen a GI doctor at [facility].   Subjective:   1. Pre-diabetes Cassandra [name] is tolerating metformin, and she is working on eating healthier and increasing activity.  2. OSA on CPAP Cassandra [name] is not using CPAP as she cannot tolerate it. She notes 4-5 hours of sleep and this could be affecting her weight loss efforts.  Assessment/Plan:   1. Pre-diabetes [place] will continue to work on weight loss, exercise, and decreasing simple carbohydrates to help decrease the risk of diabetes. We will obtain lab from [facility], and we will refill metformin for 1 month.  - metFORMIN (GLUCOPHAGE) 500 MG tablet; Take 1 tablet (500 mg total) by mouth 2 (two) times daily with a meal.  Dispense: 60 tablet; Refill: 0  2. OSA on CPAP Intensive lifestyle modifications are the first line treatment for this issue. We discussed several lifestyle modifications today. Cassandra [name] was offered a referral to sleep specialists, she will consider but she is not ready yet. She will continue to work on exercise and weight  loss efforts. We will continue to monitor. Orders and follow up as documented in patient record.   3. Obesity wit current BMI of 32.7 [place] is currently in the action stage of change. As such, her goal is to continue with weight loss efforts. She has agreed to practicing portion control and making smarter food choices, such as increasing vegetables and decreasing simple carbohydrates.   Cassandra [name] is to schedule a second opinion with Dr. Juleen China in 8 weeks.  Exercise goals: As is.  Behavioral modification strategies: decreasing simple carbohydrates and increasing vegetables.  Cassandra [name] has agreed to follow-up with our clinic in 4 weeks with myself, and in 8 weeks with Dr. Juleen China. She was informed of the importance of frequent follow-up visits to maximize her success with intensive lifestyle modifications for her multiple health conditions.   Objective:   Blood pressure 123/76, pulse 79, temperature 98 F (36.7 C), height 5\' 5"  (1.651 m), weight 196 lb (88.9 kg), SpO2 96 %. Body mass index is 32.62 kg/m.  General: Cooperative, alert, well developed, in no acute distress. HEENT: Conjunctivae and lids unremarkable. Cardiovascular: Regular rhythm.  Lungs: Normal work of breathing. Neurologic: No focal deficits.   Lab Results  Component Value Date   CREATININE 0.79 04/17/2020   BUN 21 04/17/2020   NA 138 04/17/2020   K 4.1 04/17/2020   CL 100 04/17/2020   CO2 25 04/17/2020   Lab Results  Component Value Date   ALT 6 04/17/2020   AST 14 04/17/2020   ALKPHOS 108 04/17/2020  BILITOT 0.4 04/17/2020   Lab Results  Component Value Date   HGBA1C 5.6 04/17/2020   HGBA1C 5.7 (H) 11/13/2019   HGBA1C 5.4 08/29/2019   HGBA1C 6.1 01/17/2019   HGBA1C 4.9 01/06/2007   Lab Results  Component Value Date   INSULIN 15.9 04/17/2020   INSULIN 30.1 (H) 11/13/2019   Lab Results  Component Value Date   TSH 1.680 11/13/2019   Lab Results  Component Value Date   CHOL 172 04/17/2020   HDL 70  04/17/2020   LDLCALC 91 04/17/2020   TRIG 56 04/17/2020   CHOLHDL 3 01/17/2019   Lab Results  Component Value Date   WBC 7.7 11/13/2019   HGB 14.5 11/13/2019   HCT 47.4 (H) 11/13/2019   MCV 86 11/13/2019   PLT 283 11/13/2019   Lab Results  Component Value Date   IRON 66 11/13/2019   TIBC 465 (H) 11/13/2019   FERRITIN 30 11/13/2019    Obesity Behavioral Intervention:   Approximately 15 minutes were spent on the discussion below.  ASK: We discussed the diagnosis of obesity with [name] today and Jewelia agreed to give [place] permission to discuss obesity behavioral modification therapy today.  ASSESS: Cassandra [name] has the diagnosis of obesity and her BMI today is 32.62. Cassandra [name] is in the action stage of change.   ADVISE: Cassandra [name] was educated on the multiple health risks of obesity as well as the benefit of weight loss to improve her health. She was advised of the need for long term treatment and the importance of lifestyle modifications to improve her current health and to decrease her risk of future health problems.  AGREE: Multiple dietary modification options and treatment options were discussed and Cassandra [name] agreed to follow the recommendations documented in the above note.  ARRANGE: Cassandra [name] was educated on the importance of frequent visits to treat obesity as outlined per CMS and USPSTF guidelines and agreed to schedule her next follow up appointment today.  Attestation Statements:   Reviewed by clinician on day of visit: allergies, medications, problem list, medical history, surgical history, family history, social history, and previous encounter notes.   I, Trixie Dredge, am acting as transcriptionist for Dennard Nip, MD.  I have reviewed the above documentation for accuracy and completeness, and I agree with the above. -  Dennard Nip, MD

## 2020-10-06 ENCOUNTER — Other Ambulatory Visit: Payer: Self-pay

## 2020-10-06 ENCOUNTER — Ambulatory Visit (HOSPITAL_BASED_OUTPATIENT_CLINIC_OR_DEPARTMENT_OTHER): Payer: PPO | Attending: Family Medicine | Admitting: Physical Therapy

## 2020-10-06 ENCOUNTER — Encounter (HOSPITAL_BASED_OUTPATIENT_CLINIC_OR_DEPARTMENT_OTHER): Payer: Self-pay | Admitting: Physical Therapy

## 2020-10-06 DIAGNOSIS — G8929 Other chronic pain: Secondary | ICD-10-CM | POA: Insufficient documentation

## 2020-10-06 DIAGNOSIS — M25561 Pain in right knee: Secondary | ICD-10-CM | POA: Insufficient documentation

## 2020-10-06 DIAGNOSIS — M25562 Pain in left knee: Secondary | ICD-10-CM | POA: Diagnosis not present

## 2020-10-06 NOTE — Therapy (Signed)
[facility] [address] [place], [place], [address] Phone: [phone]   Fax:  [phone]  Physical Therapy Treatment  Patient Details  Name: Cassandra [name] MRN: [id number] Date of Birth: 07-16-1945 Referring Provider (PT): Charlann Boxer, DO   Encounter Date: 10/06/2020   PT End of Session - 10/06/20 1519    Visit Number 2    Number of Visits 21    Date for PT Re-Evaluation 12/12/20    Authorization Type [employer]    PT Start Time 1515    PT Stop Time 1600    PT Time Calculation (min) 45 min    Activity Tolerance Patient tolerated treatment well    Behavior During Therapy [facility] for tasks assessed/performed           Past Medical History:  Diagnosis Date  . Anemia   . Anxiety   . Asthma   . Back pain   . BPPV (benign paroxysmal positional vertigo)   . Chronic allergic conjunctivitis   . Depression   . Diverticulosis   . Fibromyalgia   . Gallbladder problem   . Gallstones   . GERD (gastroesophageal reflux disease)   . Hiatal hernia   . History of bladder stone   . Iron (Fe) deficiency anemia   . Joint pain   . Lump in female breast   . Mild persistent asthma    followed by pcp--- dr r. sharma ([place] allergy/asthma)  . OA (osteoarthritis)    knees, hands  . OAB (overactive bladder)   . OSA (obstructive sleep apnea)    per pt has not used cpap several years  . Pancreatic cyst   . PONV (postoperative nausea and vomiting)   . RLS (restless legs syndrome)   . Seasonal and perennial allergic rhinitis   . SUI (stress urinary incontinence, female)   . Swallowing difficulty   . Vitamin B12 deficiency   . Vitamin D deficiency   . Wears glasses     Past Surgical History:  Procedure Laterality Date  . BLADDER SUSPENSION  1990's   sling  . bladder tacking  2010   with mesh  . BREAST LUMPECTOMY Left 1985   Benign cyst  . BREAST REDUCTION SURGERY Bilateral 2003 approx.  . CORNEA LESION EXCISION  Right 2005  approx.  . CYSTOSCOPY N/A 07/12/2016   Procedure: CYSTOSCOPY, VAGINOSCOPY, EXAM UNDER ANESTHESIA, STONE LITHOTRIPSY,;  Surgeon: Cleon Gustin, MD;  Location: [facility];  Service: Urology;  Laterality: N/A;  . CYSTOSCOPY N/A 10/11/2016   Procedure: CYSTOSCOPY;  Surgeon: Cleon Gustin, MD;  Location: [facility];  Service: Urology;  Laterality: N/A;  . CYSTOSCOPY WITH LITHOLAPAXY N/A 10/11/2016   Procedure: CYSTOSCOPY WITH LITHOLAPAXY/ EXCISION OF MESH;  Surgeon: Cleon Gustin, MD;  Location: [facility];  Service: Urology;  Laterality: N/A;  . CYSTOSCOPY WITH LITHOLAPAXY N/A 01/22/2019   Procedure: CYSTOSCOPY WITH LITHOLAPAXY;  Surgeon: Cleon Gustin, MD;  Location: [facility];  Service: Urology;  Laterality: N/A;  1 HR  . HOLMIUM LASER APPLICATION  3/71/0626   Procedure: HOLMIUM LASER APPLICATION;  Surgeon: Cleon Gustin, MD;  Location: [facility];  Service: Urology;;  . HOLMIUM LASER APPLICATION N/A 9/48/5462   Procedure: HOLMIUM LASER APPLICATION;  Surgeon: Cleon Gustin, MD;  Location: [facility];  Service: Urology;  Laterality: N/A;  . RIGHT KNEE ARTHROSCOPY  2013  . TOTAL KNEE ARTHROPLASTY Right 09/12/2012  Procedure: RIGHT TOTAL KNEE ARTHROPLASTY;  Surgeon: Gearlean Alf, MD;  Location: [facility];  Service: Orthopedics;  Laterality: Right;  Marland Kitchen VAGINAL HYSTERECTOMY  1984    There were no vitals filed for this visit.   Subjective Assessment - 10/06/20 1517    Subjective I have been rolling on my hamstrings and stretching. I didn't have a problem with them with cold weather yesterday but hurting more today. I did a lot of riding in the car and got stiff.    Patient Stated Goals lose weight    Currently in Pain? Yes    Pain Score 8     Pain Location Knee    Pain Orientation Right;Left    Pain Descriptors / Indicators Sore                              [facility] Adult PT Treatment/Exercise - 10/06/20 0001      Knee/Hip Exercises: Standing   Gait Training trunk rotation/arm swing, stride length    Other Standing Knee Exercises step ups- cues for gluts      Knee/Hip Exercises: Seated   Sit to Sand 20 reps;without UE support   ball bw knees     Knee/Hip Exercises: Supine   Other Supine Knee/Hip Exercises shuttle press- neutral & turnout 3x25                    PT Short Term Goals - 09/30/20 1944      PT SHORT TERM GOAL #1   Title neutral innominate rotation    Baseline Rt post at eval    Time 3    Period Weeks    Status New    Target Date 10/24/20             PT Long Term Goals - 09/30/20 1945      PT LONG TERM GOAL #1   Title pt will be on path to use of exercise for weight loss    Baseline has not had success with diet so far    Time 10    Period Weeks    Status New    Target Date 12/12/20      PT LONG TERM GOAL #2   Title able to sleep without "drawing up" of hamstrings    Baseline unable at eval    Time 10    Period Weeks    Status New    Target Date 12/12/20      PT LONG TERM GOAL #3   Title pt will demo gross LE strength 5/5    Baseline not appropriate to test at eval due to notable rotation    Time 10    Period Weeks    Status New    Target Date 12/12/20                 Plan - 10/06/20 1559    Clinical Impression Statement exercises to encourage core/glut activation and remove pressure through knees. required tactile and verbal cues to perform trunk rotation in gait but did feel more balanced.    PT Treatment/Interventions ADLs/Self Care Home Management;Moist Heat;Iontophoresis 4mg /ml Dexamethasone;Electrical Stimulation;Cryotherapy;Traction;Therapeutic activities;Functional mobility training;Stair training;Gait training;Therapeutic exercise;Balance training;Neuromuscular re-education;Patient/family education;Manual techniques;Taping;Dry  needling;Passive range of motion    PT Next Visit Plan unstable surfaces, recheck gait    PT Home Exercise Plan [id number], increase 3d/week by 10 min walk, every 2 weeks incr 10 min    Consulted  and Agree with Plan of Care Patient           Patient will benefit from skilled therapeutic intervention in order to improve the following deficits and impairments:  Difficulty walking,Increased muscle spasms,Improper body mechanics,Decreased activity tolerance,Impaired flexibility,Postural dysfunction,Pain,Decreased strength  Visit Diagnosis: Chronic pain of left knee  Chronic pain of right knee     Problem List Patient Active Problem List   Diagnosis Date Noted  . LUQ pain 09/15/2020  . Calculus of gallbladder without cholecystitis without obstruction 09/15/2020  . Hamstring tendinitis 09/10/2020  . Pyrosis 07/21/2020  . 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  . Right hip pain 06/12/2020  . Pre-diabetes 03/12/2020  . Seasonal allergies 03/12/2020  . Obesity 10/30/2019  . Low back pain 09/11/2019  . Degenerative joint disease of knee, left 09/11/2019  . Pancreatic cyst 01/25/2019  . Gastroesophageal reflux disease 01/25/2019  . Esophageal dysphagia 01/25/2019  . Unintentional weight loss 01/25/2019  . IGT (impaired glucose tolerance) 01/17/2019  . BPPV (benign paroxysmal positional vertigo) 10/23/2012  . Vertigo 10/21/2012  . UTI (lower urinary tract infection) 10/21/2012  . Nausea & vomiting 10/21/2012  . Postoperative anemia due to acute blood loss 09/14/2012  . OA (osteoarthritis) of knee 09/12/2012  . Insomnia 08/20/2011  . OBSTRUCTIVE SLEEP APNEA 04/23/2008  . RESTLESS LEG SYNDROME 04/23/2008  . FIBROMYALGIA 01/06/2007     C.  PT, DPT 10/06/20 4:01 PM   [facility] [address] [place], [place], [address] Phone: [phone]   Fax:  [phone]  Name: Cassandra [name] MRN:  [id number] Date of Birth: 1945-10-08

## 2020-10-07 ENCOUNTER — Ambulatory Visit (HOSPITAL_BASED_OUTPATIENT_CLINIC_OR_DEPARTMENT_OTHER): Payer: PPO | Admitting: Physical Therapy

## 2020-10-07 ENCOUNTER — Encounter (HOSPITAL_BASED_OUTPATIENT_CLINIC_OR_DEPARTMENT_OTHER): Payer: Self-pay | Admitting: Physical Therapy

## 2020-10-07 DIAGNOSIS — M25562 Pain in left knee: Secondary | ICD-10-CM

## 2020-10-07 DIAGNOSIS — M25561 Pain in right knee: Secondary | ICD-10-CM

## 2020-10-07 DIAGNOSIS — G8929 Other chronic pain: Secondary | ICD-10-CM

## 2020-10-07 DIAGNOSIS — J301 Allergic rhinitis due to pollen: Secondary | ICD-10-CM | POA: Diagnosis not present

## 2020-10-07 DIAGNOSIS — J3089 Other allergic rhinitis: Secondary | ICD-10-CM | POA: Diagnosis not present

## 2020-10-07 NOTE — Therapy (Signed)
[facility] [address] [place], [place], [address] Phone: [phone]   Fax:  [phone]  Physical Therapy Treatment  Patient Details  Name: Cassandra [name] MRN: [id number] Date of Birth: May 20, 1946 Referring Provider (PT): Charlann Boxer, DO   Encounter Date: 10/07/2020   PT End of Session - 10/07/20 1558    Visit Number 3    Number of Visits 21    Date for PT Re-Evaluation 12/12/20    Authorization Type [employer]    PT Start Time 7341    PT Stop Time 1641    PT Time Calculation (min) 44 min    Activity Tolerance Patient tolerated treatment well    Behavior During Therapy [facility] for tasks assessed/performed           Past Medical History:  Diagnosis Date  . Anemia   . Anxiety   . Asthma   . Back pain   . BPPV (benign paroxysmal positional vertigo)   . Chronic allergic conjunctivitis   . Depression   . Diverticulosis   . Fibromyalgia   . Gallbladder problem   . Gallstones   . GERD (gastroesophageal reflux disease)   . Hiatal hernia   . History of bladder stone   . Iron (Fe) deficiency anemia   . Joint pain   . Lump in female breast   . Mild persistent asthma    followed by pcp--- dr r. sharma ([place] allergy/asthma)  . OA (osteoarthritis)    knees, hands  . OAB (overactive bladder)   . OSA (obstructive sleep apnea)    per pt has not used cpap several years  . Pancreatic cyst   . PONV (postoperative nausea and vomiting)   . RLS (restless legs syndrome)   . Seasonal and perennial allergic rhinitis   . SUI (stress urinary incontinence, female)   . Swallowing difficulty   . Vitamin B12 deficiency   . Vitamin D deficiency   . Wears glasses     Past Surgical History:  Procedure Laterality Date  . BLADDER SUSPENSION  1990's   sling  . bladder tacking  2010   with mesh  . BREAST LUMPECTOMY Left 1985   Benign cyst  . BREAST REDUCTION SURGERY Bilateral 2003 approx.  . CORNEA LESION EXCISION  Right 2005  approx.  . CYSTOSCOPY N/A 07/12/2016   Procedure: CYSTOSCOPY, VAGINOSCOPY, EXAM UNDER ANESTHESIA, STONE LITHOTRIPSY,;  Surgeon: Cleon Gustin, MD;  Location: [facility];  Service: Urology;  Laterality: N/A;  . CYSTOSCOPY N/A 10/11/2016   Procedure: CYSTOSCOPY;  Surgeon: Cleon Gustin, MD;  Location: [facility];  Service: Urology;  Laterality: N/A;  . CYSTOSCOPY WITH LITHOLAPAXY N/A 10/11/2016   Procedure: CYSTOSCOPY WITH LITHOLAPAXY/ EXCISION OF MESH;  Surgeon: Cleon Gustin, MD;  Location: [facility];  Service: Urology;  Laterality: N/A;  . CYSTOSCOPY WITH LITHOLAPAXY N/A 01/22/2019   Procedure: CYSTOSCOPY WITH LITHOLAPAXY;  Surgeon: Cleon Gustin, MD;  Location: [facility];  Service: Urology;  Laterality: N/A;  1 HR  . HOLMIUM LASER APPLICATION  9/37/9024   Procedure: HOLMIUM LASER APPLICATION;  Surgeon: Cleon Gustin, MD;  Location: [facility];  Service: Urology;;  . HOLMIUM LASER APPLICATION N/A 0/97/3532   Procedure: HOLMIUM LASER APPLICATION;  Surgeon: Cleon Gustin, MD;  Location: [facility];  Service: Urology;  Laterality: N/A;  . RIGHT KNEE ARTHROSCOPY  2013  . TOTAL KNEE ARTHROPLASTY Right 09/12/2012  Procedure: RIGHT TOTAL KNEE ARTHROPLASTY;  Surgeon: Gearlean Alf, MD;  Location: [facility];  Service: Orthopedics;  Laterality: Right;  Marland Kitchen VAGINAL HYSTERECTOMY  1984    There were no vitals filed for this visit.   Subjective Assessment - 10/07/20 1558    Subjective Slept really well last night. Mowed lawn this afternoon and my knees are sore.                             [facility] Adult PT Treatment/Exercise - 10/07/20 0001      Knee/Hip Exercises: Standing   Heel Raises Both;20 reps    Other Standing Knee Exercises airex: marching, NBOS, lateral step ups    Other Standing Knee Exercises hip hinge with dowel progressed to hinge to  floor reach      Knee/Hip Exercises: Seated   Other Seated Knee/Hip Exercises hip hinge with dowel      Knee/Hip Exercises: Supine   Bridges with Ball Squeeze 20 reps   in DF- cues for core   Other Supine Knee/Hip Exercises TT holds ball bw knees      Knee/Hip Exercises: Sidelying   Hip ABduction Both;15 reps    Hip ABduction Limitations arcs      Knee/Hip Exercises: Prone   Hip Extension Limitations pillow under hips- cues to reduce lumbar use                    PT Short Term Goals - 09/30/20 1944      PT SHORT TERM GOAL #1   Title neutral innominate rotation    Baseline Rt post at eval    Time 3    Period Weeks    Status New    Target Date 10/24/20             PT Long Term Goals - 09/30/20 1945      PT LONG TERM GOAL #1   Title pt will be on path to use of exercise for weight loss    Baseline has not had success with diet so far    Time 10    Period Weeks    Status New    Target Date 12/12/20      PT LONG TERM GOAL #2   Title able to sleep without "drawing up" of hamstrings    Baseline unable at eval    Time 10    Period Weeks    Status New    Target Date 12/12/20      PT LONG TERM GOAL #3   Title pt will demo gross LE strength 5/5    Baseline not appropriate to test at eval due to notable rotation    Time 10    Period Weeks    Status New    Target Date 12/12/20                 Plan - 10/07/20 1929    Clinical Impression Statement Knees were sore after doing yard work today. was able to tol some standing work and then moved to supine/SL to encourage strengthening even when she is sore. Pt denied increase in pain with exercises and was able to recognize core control vs overuse of lumbar extension.    PT Treatment/Interventions ADLs/Self Care Home Management;Moist Heat;Iontophoresis 4mg /ml Dexamethasone;Electrical Stimulation;Cryotherapy;Traction;Therapeutic activities;Functional mobility training;Stair training;Gait training;Therapeutic  exercise;Balance training;Neuromuscular re-education;Patient/family education;Manual techniques;Taping;Dry needling;Passive range of motion    PT Next Visit Plan review hip hinge  PT Home Exercise Plan [id number], increase 3d/week by 10 min walk, every 2 weeks incr 10 min    Consulted and Agree with Plan of Care Patient           Patient will benefit from skilled therapeutic intervention in order to improve the following deficits and impairments:  Difficulty walking,Increased muscle spasms,Improper body mechanics,Decreased activity tolerance,Impaired flexibility,Postural dysfunction,Pain,Decreased strength  Visit Diagnosis: Chronic pain of right knee  Chronic pain of left knee     Problem List Patient Active Problem List   Diagnosis Date Noted  . LUQ pain 09/15/2020  . Calculus of gallbladder without cholecystitis without obstruction 09/15/2020  . Hamstring tendinitis 09/10/2020  . Pyrosis 07/21/2020  . 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  . Right hip pain 06/12/2020  . Pre-diabetes 03/12/2020  . Seasonal allergies 03/12/2020  . Obesity 10/30/2019  . Low back pain 09/11/2019  . Degenerative joint disease of knee, left 09/11/2019  . Pancreatic cyst 01/25/2019  . Gastroesophageal reflux disease 01/25/2019  . Esophageal dysphagia 01/25/2019  . Unintentional weight loss 01/25/2019  . IGT (impaired glucose tolerance) 01/17/2019  . BPPV (benign paroxysmal positional vertigo) 10/23/2012  . Vertigo 10/21/2012  . UTI (lower urinary tract infection) 10/21/2012  . Nausea & vomiting 10/21/2012  . Postoperative anemia due to acute blood loss 09/14/2012  . OA (osteoarthritis) of knee 09/12/2012  . Insomnia 08/20/2011  . OBSTRUCTIVE SLEEP APNEA 04/23/2008  . RESTLESS LEG SYNDROME 04/23/2008  . FIBROMYALGIA 01/06/2007     C.  PT, DPT 10/07/20 7:32 PM   [facility] [address] [place], [place], [address] Phone: [phone]   Fax:  [phone]  Name: Cassandra [name] MRN: [id number] Date of Birth: Feb 28, 1946

## 2020-10-14 ENCOUNTER — Encounter (HOSPITAL_BASED_OUTPATIENT_CLINIC_OR_DEPARTMENT_OTHER): Payer: Self-pay | Admitting: Physical Therapy

## 2020-10-14 ENCOUNTER — Ambulatory Visit (HOSPITAL_BASED_OUTPATIENT_CLINIC_OR_DEPARTMENT_OTHER): Payer: PPO | Admitting: Physical Therapy

## 2020-10-14 DIAGNOSIS — G8929 Other chronic pain: Secondary | ICD-10-CM

## 2020-10-14 DIAGNOSIS — M25561 Pain in right knee: Secondary | ICD-10-CM

## 2020-10-14 DIAGNOSIS — M25562 Pain in left knee: Secondary | ICD-10-CM | POA: Diagnosis not present

## 2020-10-14 NOTE — Therapy (Signed)
[facility] [address] [place], [place], [address] Phone: [phone]   Fax:  [phone]  Physical Therapy Treatment  Patient Details  Name: Cassandra [name] MRN: [id number] Date of Birth: Aug 18, 1945 Referring Provider (PT): Charlann Boxer, DO   Encounter Date: 10/14/2020   PT End of Session - 10/14/20 1601    Visit Number 4    Number of Visits 21    Date for PT Re-Evaluation 12/12/20    Authorization Type [employer]    PT Start Time 1600    PT Stop Time 1641    PT Time Calculation (min) 41 min    Activity Tolerance Patient tolerated treatment well    Behavior During Therapy [facility] for tasks assessed/performed           Past Medical History:  Diagnosis Date  . Anemia   . Anxiety   . Asthma   . Back pain   . BPPV (benign paroxysmal positional vertigo)   . Chronic allergic conjunctivitis   . Depression   . Diverticulosis   . Fibromyalgia   . Gallbladder problem   . Gallstones   . GERD (gastroesophageal reflux disease)   . Hiatal hernia   . History of bladder stone   . Iron (Fe) deficiency anemia   . Joint pain   . Lump in female breast   . Mild persistent asthma    followed by pcp--- dr r. sharma ([place] allergy/asthma)  . OA (osteoarthritis)    knees, hands  . OAB (overactive bladder)   . OSA (obstructive sleep apnea)    per pt has not used cpap several years  . Pancreatic cyst   . PONV (postoperative nausea and vomiting)   . RLS (restless legs syndrome)   . Seasonal and perennial allergic rhinitis   . SUI (stress urinary incontinence, female)   . Swallowing difficulty   . Vitamin B12 deficiency   . Vitamin D deficiency   . Wears glasses     Past Surgical History:  Procedure Laterality Date  . BLADDER SUSPENSION  1990's   sling  . bladder tacking  2010   with mesh  . BREAST LUMPECTOMY Left 1985   Benign cyst  . BREAST REDUCTION SURGERY Bilateral 2003 approx.  . CORNEA LESION EXCISION  Right 2005  approx.  . CYSTOSCOPY N/A 07/12/2016   Procedure: CYSTOSCOPY, VAGINOSCOPY, EXAM UNDER ANESTHESIA, STONE LITHOTRIPSY,;  Surgeon: Cleon Gustin, MD;  Location: [facility];  Service: Urology;  Laterality: N/A;  . CYSTOSCOPY N/A 10/11/2016   Procedure: CYSTOSCOPY;  Surgeon: Cleon Gustin, MD;  Location: [facility];  Service: Urology;  Laterality: N/A;  . CYSTOSCOPY WITH LITHOLAPAXY N/A 10/11/2016   Procedure: CYSTOSCOPY WITH LITHOLAPAXY/ EXCISION OF MESH;  Surgeon: Cleon Gustin, MD;  Location: [facility];  Service: Urology;  Laterality: N/A;  . CYSTOSCOPY WITH LITHOLAPAXY N/A 01/22/2019   Procedure: CYSTOSCOPY WITH LITHOLAPAXY;  Surgeon: Cleon Gustin, MD;  Location: [facility];  Service: Urology;  Laterality: N/A;  1 HR  . HOLMIUM LASER APPLICATION  3/81/8299   Procedure: HOLMIUM LASER APPLICATION;  Surgeon: Cleon Gustin, MD;  Location: [facility];  Service: Urology;;  . HOLMIUM LASER APPLICATION N/A 3/71/6967   Procedure: HOLMIUM LASER APPLICATION;  Surgeon: Cleon Gustin, MD;  Location: [facility];  Service: Urology;  Laterality: N/A;  . RIGHT KNEE ARTHROSCOPY  2013  . TOTAL KNEE ARTHROPLASTY Right 09/12/2012  Procedure: RIGHT TOTAL KNEE ARTHROPLASTY;  Surgeon: Gearlean Alf, MD;  Location: [facility];  Service: Orthopedics;  Laterality: Right;  Marland Kitchen VAGINAL HYSTERECTOMY  1984    There were no vitals filed for this visit.   Subjective Assessment - 10/14/20 1601    Subjective Walked about 2.5 miles today and my knees are hurting right now. Balance was not great today.    Patient Stated Goals lose weight    Currently in Pain? Yes    Pain Score 8     Pain Location Knee    Pain Orientation Right;Left    Pain Descriptors / Indicators Sore    Aggravating Factors  weight bearing    Pain Relieving Factors rest, ice                              [facility] Adult PT Treatment/Exercise - 10/14/20 0001      Knee/Hip Exercises: Stretches   Passive Hamstring Stretch Limitations seated edge of chair    Piriformis Stretch Limitations seated in chair      Knee/Hip Exercises: Aerobic   Nustep 5 min L5 UE & LE      Knee/Hip Exercises: Standing   Gait Training trunk rotation, heel strike    Other Standing Knee Exercises hip hinge with and without dowel      Knee/Hip Exercises: Seated   Sit to Sand 10 reps;without UE support                    PT Short Term Goals - 09/30/20 1944      PT SHORT TERM GOAL #1   Title neutral innominate rotation    Baseline Rt post at eval    Time 3    Period Weeks    Status New    Target Date 10/24/20             PT Long Term Goals - 09/30/20 1945      PT LONG TERM GOAL #1   Title pt will be on path to use of exercise for weight loss    Baseline has not had success with diet so far    Time 10    Period Weeks    Status New    Target Date 12/12/20      PT LONG TERM GOAL #2   Title able to sleep without "drawing up" of hamstrings    Baseline unable at eval    Time 10    Period Weeks    Status New    Target Date 12/12/20      PT LONG TERM GOAL #3   Title pt will demo gross LE strength 5/5    Baseline not appropriate to test at eval due to notable rotation    Time 10    Period Weeks    Status New    Target Date 12/12/20                 Plan - 10/14/20 1626    Clinical Impression Statement 3-ayer lift in left shoe and pt denied any pain with it in. tends to avoid trunk rotation in gait and swinging arms but was cues via tactile cues on track today. excellent form in hip hinge without dowel.    PT Treatment/Interventions ADLs/Self Care Home Management;Moist Heat;Iontophoresis 4mg /ml Dexamethasone;Electrical Stimulation;Cryotherapy;Traction;Therapeutic activities;Functional mobility training;Stair training;Gait training;Therapeutic  exercise;Balance training;Neuromuscular re-education;Patient/family education;Manual techniques;Taping;Dry needling;Passive range of motion    PT Next  Visit Plan how is heel lift?    PT Home Exercise Plan [id number], increase 3d/week by 10 min walk, every 2 weeks incr 10 min    Consulted and Agree with Plan of Care Patient           Patient will benefit from skilled therapeutic intervention in order to improve the following deficits and impairments:  Difficulty walking,Increased muscle spasms,Improper body mechanics,Decreased activity tolerance,Impaired flexibility,Postural dysfunction,Pain,Decreased strength  Visit Diagnosis: Chronic pain of right knee  Chronic pain of left knee     Problem List Patient Active Problem List   Diagnosis Date Noted  . LUQ pain 09/15/2020  . Calculus of gallbladder without cholecystitis without obstruction 09/15/2020  . Hamstring tendinitis 09/10/2020  . Pyrosis 07/21/2020  . 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  . Right hip pain 06/12/2020  . Pre-diabetes 03/12/2020  . Seasonal allergies 03/12/2020  . Obesity 10/30/2019  . Low back pain 09/11/2019  . Degenerative joint disease of knee, left 09/11/2019  . Pancreatic cyst 01/25/2019  . Gastroesophageal reflux disease 01/25/2019  . Esophageal dysphagia 01/25/2019  . Unintentional weight loss 01/25/2019  . IGT (impaired glucose tolerance) 01/17/2019  . BPPV (benign paroxysmal positional vertigo) 10/23/2012  . Vertigo 10/21/2012  . UTI (lower urinary tract infection) 10/21/2012  . Nausea & vomiting 10/21/2012  . Postoperative anemia due to acute blood loss 09/14/2012  . OA (osteoarthritis) of knee 09/12/2012  . Insomnia 08/20/2011  . OBSTRUCTIVE SLEEP APNEA 04/23/2008  . RESTLESS LEG SYNDROME 04/23/2008  . FIBROMYALGIA 01/06/2007     C.  PT, DPT 10/14/20 4:45 PM   [facility] [address] [place], [place], [address] Phone: [phone]   Fax:  [phone]  Name: Cassandra [name] MRN: [id number] Date of Birth: [birth year]

## 2020-10-15 DIAGNOSIS — J301 Allergic rhinitis due to pollen: Secondary | ICD-10-CM | POA: Diagnosis not present

## 2020-10-15 DIAGNOSIS — J3089 Other allergic rhinitis: Secondary | ICD-10-CM | POA: Diagnosis not present

## 2020-10-17 ENCOUNTER — Encounter (HOSPITAL_BASED_OUTPATIENT_CLINIC_OR_DEPARTMENT_OTHER): Payer: Self-pay | Admitting: Physical Therapy

## 2020-10-17 ENCOUNTER — Other Ambulatory Visit: Payer: Self-pay

## 2020-10-17 ENCOUNTER — Ambulatory Visit (HOSPITAL_BASED_OUTPATIENT_CLINIC_OR_DEPARTMENT_OTHER): Payer: PPO | Admitting: Physical Therapy

## 2020-10-17 DIAGNOSIS — G8929 Other chronic pain: Secondary | ICD-10-CM

## 2020-10-17 DIAGNOSIS — M25562 Pain in left knee: Secondary | ICD-10-CM | POA: Diagnosis not present

## 2020-10-17 NOTE — Therapy (Signed)
[facility] [address] [place], [place], [address] Phone: [phone]   Fax:  [phone]  Physical Therapy Treatment  Patient Details  Name: Cassandra [name] MRN: [id number] Date of Birth: 12-Aug-1945 Referring Provider (PT): Charlann Boxer, DO   Encounter Date: 10/17/2020   PT End of Session - 10/17/20 1303    Visit Number 5    Number of Visits 21    Date for PT Re-Evaluation 12/12/20    Authorization Type [employer]    PT Start Time 1300    PT Stop Time 1340    PT Time Calculation (min) 40 min    Activity Tolerance Patient tolerated treatment well    Behavior During Therapy [facility] for tasks assessed/performed           Past Medical History:  Diagnosis Date  . Anemia   . Anxiety   . Asthma   . Back pain   . BPPV (benign paroxysmal positional vertigo)   . Chronic allergic conjunctivitis   . Depression   . Diverticulosis   . Fibromyalgia   . Gallbladder problem   . Gallstones   . GERD (gastroesophageal reflux disease)   . Hiatal hernia   . History of bladder stone   . Iron (Fe) deficiency anemia   . Joint pain   . Lump in female breast   . Mild persistent asthma    followed by pcp--- dr r. sharma ([place] allergy/asthma)  . OA (osteoarthritis)    knees, hands  . OAB (overactive bladder)   . OSA (obstructive sleep apnea)    per pt has not used cpap several years  . Pancreatic cyst   . PONV (postoperative nausea and vomiting)   . RLS (restless legs syndrome)   . Seasonal and perennial allergic rhinitis   . SUI (stress urinary incontinence, female)   . Swallowing difficulty   . Vitamin B12 deficiency   . Vitamin D deficiency   . Wears glasses     Past Surgical History:  Procedure Laterality Date  . BLADDER SUSPENSION  1990's   sling  . bladder tacking  2010   with mesh  . BREAST LUMPECTOMY Left 1985   Benign cyst  . BREAST REDUCTION SURGERY Bilateral 2003 approx.  . CORNEA LESION EXCISION  Right 2005  approx.  . CYSTOSCOPY N/A 07/12/2016   Procedure: CYSTOSCOPY, VAGINOSCOPY, EXAM UNDER ANESTHESIA, STONE LITHOTRIPSY,;  Surgeon: Cleon Gustin, MD;  Location: [facility];  Service: Urology;  Laterality: N/A;  . CYSTOSCOPY N/A 10/11/2016   Procedure: CYSTOSCOPY;  Surgeon: Cleon Gustin, MD;  Location: [facility];  Service: Urology;  Laterality: N/A;  . CYSTOSCOPY WITH LITHOLAPAXY N/A 10/11/2016   Procedure: CYSTOSCOPY WITH LITHOLAPAXY/ EXCISION OF MESH;  Surgeon: Cleon Gustin, MD;  Location: [facility];  Service: Urology;  Laterality: N/A;  . CYSTOSCOPY WITH LITHOLAPAXY N/A 01/22/2019   Procedure: CYSTOSCOPY WITH LITHOLAPAXY;  Surgeon: Cleon Gustin, MD;  Location: [facility];  Service: Urology;  Laterality: N/A;  1 HR  . HOLMIUM LASER APPLICATION  1/54/0086   Procedure: HOLMIUM LASER APPLICATION;  Surgeon: Cleon Gustin, MD;  Location: [facility];  Service: Urology;;  . HOLMIUM LASER APPLICATION N/A 7/61/9509   Procedure: HOLMIUM LASER APPLICATION;  Surgeon: Cleon Gustin, MD;  Location: [facility];  Service: Urology;  Laterality: N/A;  . RIGHT KNEE ARTHROSCOPY  2013  . TOTAL KNEE ARTHROPLASTY Right 09/12/2012  Procedure: RIGHT TOTAL KNEE ARTHROPLASTY;  Surgeon: Gearlean Alf, MD;  Location: [facility];  Service: Orthopedics;  Laterality: Right;  Marland Kitchen VAGINAL HYSTERECTOMY  1984    There were no vitals filed for this visit.   Subjective Assessment - 10/17/20 1301    Subjective Still wearing lift, I think it is helping. Walked just short of a mile this morning- Rt hip still gets a little off. I get tired when I walk.    Patient Stated Goals lose weight    Currently in Pain? Yes    Pain Score 3     Pain Location Hip    Pain Orientation Right    Pain Descriptors / Indicators Sore                             [facility] Adult PT Treatment/Exercise  - 10/17/20 0001      Knee/Hip Exercises: Stretches   Active Hamstring Stretch Limitations standing    Gastroc Stretch Limitations standing with hip flexors    Other Knee/Hip Stretches adductor stretch standing      Knee/Hip Exercises: Standing   Gait Training trunk rotation, heel strike      Modalities   Modalities Iontophoresis      Iontophoresis   Type of Iontophoresis Dexamethasone    Location Rt greater trochanter    Dose 1cc    Time 6 hr wear time      Manual Therapy   Manual Therapy Soft tissue mobilization    Soft tissue mobilization Rt hip                    PT Short Term Goals - 09/30/20 1944      PT SHORT TERM GOAL #1   Title neutral innominate rotation    Baseline Rt post at eval    Time 3    Period Weeks    Status New    Target Date 10/24/20             PT Long Term Goals - 09/30/20 1945      PT LONG TERM GOAL #1   Title pt will be on path to use of exercise for weight loss    Baseline has not had success with diet so far    Time 10    Period Weeks    Status New    Target Date 12/12/20      PT LONG TERM GOAL #2   Title able to sleep without "drawing up" of hamstrings    Baseline unable at eval    Time 10    Period Weeks    Status New    Target Date 12/12/20      PT LONG TERM GOAL #3   Title pt will demo gross LE strength 5/5    Baseline not appropriate to test at eval due to notable rotation    Time 10    Period Weeks    Status New    Target Date 12/12/20                 Plan - 10/17/20 1334    Clinical Impression Statement Tightness in TFL and piriformis released with STM. placed ionto patch at Rt gr troch bursa. asked her to add side stepping and standing stretches to do during walks to decr pain and improve distance tolerance.    PT Treatment/Interventions ADLs/Self Care Home Management;Moist Heat;Iontophoresis 4mg /ml Dexamethasone;Electrical Stimulation;Cryotherapy;Traction;Therapeutic activities;Functional  mobility  training;Stair training;Gait training;Therapeutic exercise;Balance training;Neuromuscular re-education;Patient/family education;Manual techniques;Taping;Dry needling;Passive range of motion    PT Next Visit Plan outcome of ionto?, obstacles in walking    PT Home Exercise Plan [id number], increase 3d/week by 10 min walk, every 2 weeks incr 10 min  [id number] (walking stretches)    Consulted and Agree with Plan of Care Patient           Patient will benefit from skilled therapeutic intervention in order to improve the following deficits and impairments:  Difficulty walking,Increased muscle spasms,Improper body mechanics,Decreased activity tolerance,Impaired flexibility,Postural dysfunction,Pain,Decreased strength  Visit Diagnosis: Chronic pain of right knee  Chronic pain of left knee     Problem List Patient Active Problem List   Diagnosis Date Noted  . LUQ pain 09/15/2020  . Calculus of gallbladder without cholecystitis without obstruction 09/15/2020  . Hamstring tendinitis 09/10/2020  . Pyrosis 07/21/2020  . 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  . Right hip pain 06/12/2020  . Pre-diabetes 03/12/2020  . Seasonal allergies 03/12/2020  . Obesity 10/30/2019  . Low back pain 09/11/2019  . Degenerative joint disease of knee, left 09/11/2019  . Pancreatic cyst 01/25/2019  . Gastroesophageal reflux disease 01/25/2019  . Esophageal dysphagia 01/25/2019  . Unintentional weight loss 01/25/2019  . IGT (impaired glucose tolerance) 01/17/2019  . BPPV (benign paroxysmal positional vertigo) 10/23/2012  . Vertigo 10/21/2012  . UTI (lower urinary tract infection) 10/21/2012  . Nausea & vomiting 10/21/2012  . Postoperative anemia due to acute blood loss 09/14/2012  . OA (osteoarthritis) of knee 09/12/2012  . Insomnia 08/20/2011  . OBSTRUCTIVE SLEEP APNEA 04/23/2008  . RESTLESS LEG SYNDROME 04/23/2008  . FIBROMYALGIA 01/06/2007     C.  PT,  DPT 10/17/20 2:28 PM   [facility] [place], [place], [address] Phone: [phone]   Fax:  [phone]  Name: Cassandra [name] MRN: [id number] Date of Birth: [birth year]

## 2020-10-20 ENCOUNTER — Ambulatory Visit (HOSPITAL_BASED_OUTPATIENT_CLINIC_OR_DEPARTMENT_OTHER): Payer: PPO | Admitting: Physical Therapy

## 2020-10-20 ENCOUNTER — Other Ambulatory Visit: Payer: Self-pay

## 2020-10-20 DIAGNOSIS — J3089 Other allergic rhinitis: Secondary | ICD-10-CM | POA: Diagnosis not present

## 2020-10-20 DIAGNOSIS — M25562 Pain in left knee: Secondary | ICD-10-CM | POA: Diagnosis not present

## 2020-10-20 DIAGNOSIS — J301 Allergic rhinitis due to pollen: Secondary | ICD-10-CM | POA: Diagnosis not present

## 2020-10-20 DIAGNOSIS — G8929 Other chronic pain: Secondary | ICD-10-CM

## 2020-10-20 NOTE — Therapy (Signed)
[facility] [place] [address] [place], [place], [address] Phone: [phone]   Fax:  [phone]  Physical Therapy Treatment  Patient Details  Name: Cassandra [name] MRN: [id number] Date of Birth: 04/06/1946 Referring Provider (PT): Charlann Boxer, DO   Encounter Date: 10/20/2020   PT End of Session - 10/20/20 1431    Visit Number 6    Number of Visits 21    Date for PT Re-Evaluation 12/12/20    Authorization Type [employer]    PT Start Time 1430    PT Stop Time 1512    PT Time Calculation (min) 42 min    Activity Tolerance Patient tolerated treatment well;Treatment limited secondary to medical complications (Comment)    Behavior During Therapy [facility] for tasks assessed/performed           Past Medical History:  Diagnosis Date  . Anemia   . Anxiety   . Asthma   . Back pain   . BPPV (benign paroxysmal positional vertigo)   . Chronic allergic conjunctivitis   . Depression   . Diverticulosis   . Fibromyalgia   . Gallbladder problem   . Gallstones   . GERD (gastroesophageal reflux disease)   . Hiatal hernia   . History of bladder stone   . Iron (Fe) deficiency anemia   . Joint pain   . Lump in female breast   . Mild persistent asthma    followed by pcp--- dr r. sharma ([place] allergy/asthma)  . OA (osteoarthritis)    knees, hands  . OAB (overactive bladder)   . OSA (obstructive sleep apnea)    per pt has not used cpap several years  . Pancreatic cyst   . PONV (postoperative nausea and vomiting)   . RLS (restless legs syndrome)   . Seasonal and perennial allergic rhinitis   . SUI (stress urinary incontinence, female)   . Swallowing difficulty   . Vitamin B12 deficiency   . Vitamin D deficiency   . Wears glasses     Past Surgical History:  Procedure Laterality Date  . BLADDER SUSPENSION  1990's   sling  . bladder tacking  2010   with mesh  . BREAST LUMPECTOMY Left 1985   Benign cyst  . BREAST  REDUCTION SURGERY Bilateral 2003 approx.  . CORNEA LESION EXCISION Right 2005  approx.  . CYSTOSCOPY N/A 07/12/2016   Procedure: CYSTOSCOPY, VAGINOSCOPY, EXAM UNDER ANESTHESIA, STONE LITHOTRIPSY,;  Surgeon: Cleon Gustin, MD;  Location: [facility];  Service: Urology;  Laterality: N/A;  . CYSTOSCOPY N/A 10/11/2016   Procedure: CYSTOSCOPY;  Surgeon: Cleon Gustin, MD;  Location: [facility];  Service: Urology;  Laterality: N/A;  . CYSTOSCOPY WITH LITHOLAPAXY N/A 10/11/2016   Procedure: CYSTOSCOPY WITH LITHOLAPAXY/ EXCISION OF MESH;  Surgeon: Cleon Gustin, MD;  Location: [facility];  Service: Urology;  Laterality: N/A;  . CYSTOSCOPY WITH LITHOLAPAXY N/A 01/22/2019   Procedure: CYSTOSCOPY WITH LITHOLAPAXY;  Surgeon: Cleon Gustin, MD;  Location: [facility];  Service: Urology;  Laterality: N/A;  1 HR  . HOLMIUM LASER APPLICATION  2/59/5638   Procedure: HOLMIUM LASER APPLICATION;  Surgeon: Cleon Gustin, MD;  Location: [facility];  Service: Urology;;  . HOLMIUM LASER APPLICATION N/A 7/56/4332   Procedure: HOLMIUM LASER APPLICATION;  Surgeon: Cleon Gustin, MD;  Location: [facility];  Service: Urology;  Laterality: N/A;  . RIGHT KNEE ARTHROSCOPY  2013  . TOTAL  KNEE ARTHROPLASTY Right 09/12/2012   Procedure: RIGHT TOTAL KNEE ARTHROPLASTY;  Surgeon: Gearlean Alf, MD;  Location: [facility];  Service: Orthopedics;  Laterality: Right;  Marland Kitchen VAGINAL HYSTERECTOMY  1984    There were no vitals filed for this visit.   Subjective Assessment - 10/20/20 1432    Subjective Rt hurt more than Lt in lateral stepping. Ionto patch did really well.    Patient Stated Goals lose weight              [facility] PT Assessment - 10/20/20 0001      Ambulation/Gait   Gait Comments strikes toe-heel on Rt foot with obstacles until cued                         [facility] Adult PT  Treatment/Exercise - 10/20/20 0001      Knee/Hip Exercises: Aerobic   Stationary Bike 5 min L5    Recumbent Bike 5 min end of session   became dizzy     Knee/Hip Exercises: Standing   SLS step taps on cones- hip width and wide    Gait Training stepping over obstacles- fwd & lateral                  PT Education - 10/20/20 1515    Education Details meal planning    Person(s) Educated Patient    Methods Explanation    Comprehension Verbalized understanding;Need further instruction            PT Short Term Goals - 10/20/20 1434      PT SHORT TERM GOAL #1   Title neutral innominate rotation    Status Achieved             PT Long Term Goals - 09/30/20 1945      PT LONG TERM GOAL #1   Title pt will be on path to use of exercise for weight loss    Baseline has not had success with diet so far    Time 10    Period Weeks    Status New    Target Date 12/12/20      PT LONG TERM GOAL #2   Title able to sleep without "drawing up" of hamstrings    Baseline unable at eval    Time 10    Period Weeks    Status New    Target Date 12/12/20      PT LONG TERM GOAL #3   Title pt will demo gross LE strength 5/5    Baseline not appropriate to test at eval due to notable rotation    Time 10    Period Weeks    Status New    Target Date 12/12/20                 Plan - 10/20/20 1513    Clinical Impression Statement Pt became dizzy after doing bike following standing exercises. All she ate today was a greek yogurt and 1/2 PBJ. We agreed that she is not fueling her body enough prior to PT- she will eat at least 3 of 5 snack-size meals prior to appts and at least 1/2 bottle of powerade for fuel.    PT Treatment/Interventions ADLs/Self Care Home Management;Moist Heat;Iontophoresis 4mg /ml Dexamethasone;Electrical Stimulation;Cryotherapy;Traction;Therapeutic activities;Functional mobility training;Stair training;Gait training;Therapeutic exercise;Balance  training;Neuromuscular re-education;Patient/family education;Manual techniques;Taping;Dry needling;Passive range of motion    PT Next Visit Plan 5 snacks/day? cont to challenge endurance    PT Home Exercise  Plan [id number], increase 3d/week by 10 min walk, every 2 weeks incr 10 min  [id number] (walking stretches)    Consulted and Agree with Plan of Care Patient           Patient will benefit from skilled therapeutic intervention in order to improve the following deficits and impairments:  Difficulty walking,Increased muscle spasms,Improper body mechanics,Decreased activity tolerance,Impaired flexibility,Postural dysfunction,Pain,Decreased strength  Visit Diagnosis: Chronic pain of left knee  Chronic pain of right knee     Problem List Patient Active Problem List   Diagnosis Date Noted  . LUQ pain 09/15/2020  . Calculus of gallbladder without cholecystitis without obstruction 09/15/2020  . Hamstring tendinitis 09/10/2020  . Pyrosis 07/21/2020  . 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  . Right hip pain 06/12/2020  . Pre-diabetes 03/12/2020  . Seasonal allergies 03/12/2020  . Obesity 10/30/2019  . Low back pain 09/11/2019  . Degenerative joint disease of knee, left 09/11/2019  . Pancreatic cyst 01/25/2019  . Gastroesophageal reflux disease 01/25/2019  . Esophageal dysphagia 01/25/2019  . Unintentional weight loss 01/25/2019  . IGT (impaired glucose tolerance) 01/17/2019  . BPPV (benign paroxysmal positional vertigo) 10/23/2012  . Vertigo 10/21/2012  . UTI (lower urinary tract infection) 10/21/2012  . Nausea & vomiting 10/21/2012  . Postoperative anemia due to acute blood loss 09/14/2012  . OA (osteoarthritis) of knee 09/12/2012  . Insomnia 08/20/2011  . OBSTRUCTIVE SLEEP APNEA 04/23/2008  . RESTLESS LEG SYNDROME 04/23/2008  . FIBROMYALGIA 01/06/2007     C.  PT, DPT 10/20/20 3:16 PM   [facility] [address] [place], [place], [address] Phone: [phone]   Fax:  [phone]  Name: Cassandra [name] MRN: [id number] Date of Birth: September 23, 1945

## 2020-10-22 ENCOUNTER — Encounter (HOSPITAL_BASED_OUTPATIENT_CLINIC_OR_DEPARTMENT_OTHER): Payer: Self-pay | Admitting: Physical Therapy

## 2020-10-22 ENCOUNTER — Other Ambulatory Visit: Payer: Self-pay

## 2020-10-22 ENCOUNTER — Ambulatory Visit (HOSPITAL_BASED_OUTPATIENT_CLINIC_OR_DEPARTMENT_OTHER): Payer: PPO | Admitting: Physical Therapy

## 2020-10-22 DIAGNOSIS — M25562 Pain in left knee: Secondary | ICD-10-CM | POA: Diagnosis not present

## 2020-10-22 DIAGNOSIS — M25561 Pain in right knee: Secondary | ICD-10-CM

## 2020-10-22 DIAGNOSIS — G8929 Other chronic pain: Secondary | ICD-10-CM

## 2020-10-22 NOTE — Therapy (Signed)
[facility] [address] [place], [place], [address] Phone: [phone]   Fax:  [phone]  Physical Therapy Treatment  Patient Details  Name: Cassandra [name] MRN: [id number] Date of Birth: 05-14-1946 Referring Provider (PT): Charlann Boxer, DO   Encounter Date: 10/22/2020   PT End of Session - 10/22/20 1525    Visit Number 7    Number of Visits 21    Date for PT Re-Evaluation 12/12/20    Authorization Type [employer]    PT Start Time 1507    PT Stop Time 1554    PT Time Calculation (min) 47 min    Activity Tolerance Patient tolerated treatment well    Behavior During Therapy [facility] for tasks assessed/performed           Past Medical History:  Diagnosis Date  . Anemia   . Anxiety   . Asthma   . Back pain   . BPPV (benign paroxysmal positional vertigo)   . Chronic allergic conjunctivitis   . Depression   . Diverticulosis   . Fibromyalgia   . Gallbladder problem   . Gallstones   . GERD (gastroesophageal reflux disease)   . Hiatal hernia   . History of bladder stone   . Iron (Fe) deficiency anemia   . Joint pain   . Lump in female breast   . Mild persistent asthma    followed by pcp--- dr r. sharma ([place] allergy/asthma)  . OA (osteoarthritis)    knees, hands  . OAB (overactive bladder)   . OSA (obstructive sleep apnea)    per pt has not used cpap several years  . Pancreatic cyst   . PONV (postoperative nausea and vomiting)   . RLS (restless legs syndrome)   . Seasonal and perennial allergic rhinitis   . SUI (stress urinary incontinence, female)   . Swallowing difficulty   . Vitamin B12 deficiency   . Vitamin D deficiency   . Wears glasses     Past Surgical History:  Procedure Laterality Date  . BLADDER SUSPENSION  1990's   sling  . bladder tacking  2010   with mesh  . BREAST LUMPECTOMY Left 1985   Benign cyst  . BREAST REDUCTION SURGERY Bilateral 2003 approx.  . CORNEA LESION EXCISION  Right 2005  approx.  . CYSTOSCOPY N/A 07/12/2016   Procedure: CYSTOSCOPY, VAGINOSCOPY, EXAM UNDER ANESTHESIA, STONE LITHOTRIPSY,;  Surgeon: Cleon Gustin, MD;  Location: [facility];  Service: Urology;  Laterality: N/A;  . CYSTOSCOPY N/A 10/11/2016   Procedure: CYSTOSCOPY;  Surgeon: Cleon Gustin, MD;  Location[facility];  Service: Urology;  Laterality: N/A;  . CYSTOSCOPY WITH LITHOLAPAXY N/A 10/11/2016   Procedure: CYSTOSCOPY WITH LITHOLAPAXY/ EXCISION OF MESH;  Surgeon: Cleon Gustin, MD;  Location: [facility];  Service: Urology;  Laterality: N/A;  . CYSTOSCOPY WITH LITHOLAPAXY N/A 01/22/2019   Procedure: CYSTOSCOPY WITH LITHOLAPAXY;  Surgeon: Cleon Gustin, MD;  Location: [facility];  Service: Urology;  Laterality: N/A;  1 HR  . HOLMIUM LASER APPLICATION  08/30/270   Procedure: HOLMIUM LASER APPLICATION;  Surgeon: Cleon Gustin, MD;  Location: [facility];  Service: Urology;;  . HOLMIUM LASER APPLICATION N/A 5/36/6440   Procedure: HOLMIUM LASER APPLICATION;  Surgeon: Cleon Gustin, MD;  Location: [facility]  Service: Urology;  Laterality: N/A;  . RIGHT KNEE ARTHROSCOPY  2013  . TOTAL KNEE ARTHROPLASTY Right 09/12/2012  Procedure: RIGHT TOTAL KNEE ARTHROPLASTY;  Surgeon: Gearlean Alf, MD;  Location: [facility];  Service: Orthopedics;  Laterality: Right;  Marland Kitchen VAGINAL HYSTERECTOMY  1984    There were no vitals filed for this visit.   Subjective Assessment - 10/22/20 1512    Subjective Feeling sluggish from eating too much food. Lt knee is out of whack- not like it wants to crack but had a hard time getting comfortable.    Patient Stated Goals lose weight    Currently in Pain? Yes    Pain Score 6     Pain Location Knee    Pain Orientation Left    Pain Descriptors / Indicators Aching    Aggravating Factors  not sure    Pain Relieving Factors rest               [facility] PT Assessment - 10/22/20 0001      Posture/Postural Control   Posture Comments Lt post innom rotation                         [facility] Adult PT Treatment/Exercise - 10/22/20 0001      Knee/Hip Exercises: Stretches   Piriformis Stretch Limitations supine    Other Knee/Hip Stretches L pull off of bar      Knee/Hip Exercises: Aerobic   Nustep 5 min L7 UE & LE      Knee/Hip Exercises: Standing   Other Standing Knee Exercises dead lift- 3lb bar    Other Standing Knee Exercises squat pull of from bar; sumo squats at bar      Manual Therapy   Manual Therapy Muscle Energy Technique;Joint mobilization    Joint Mobilization Lt tibia on femur rotation    Muscle Energy Technique Lt hip flexors/Rt extensors                    PT Short Term Goals - 10/20/20 1434      PT SHORT TERM GOAL #1   Title neutral innominate rotation    Status Achieved             PT Long Term Goals - 09/30/20 1945      PT LONG TERM GOAL #1   Title pt will be on path to use of exercise for weight loss    Baseline has not had success with diet so far    Time 10    Period Weeks    Status New    Target Date 12/12/20      PT LONG TERM GOAL #2   Title able to sleep without "drawing up" of hamstrings    Baseline unable at eval    Time 10    Period Weeks    Status New    Target Date 12/12/20      PT LONG TERM GOAL #3   Title pt will demo gross LE strength 5/5    Baseline not appropriate to test at eval due to notable rotation    Time 10    Period Weeks    Status New    Target Date 12/12/20                 Plan - 10/22/20 1555    Clinical Impression Statement Arrived with pelvic rotation creating Lt medial knee and Lt lateral foot pain. Pain resolved after MET. Mobilization of knee joint also helped and did grind in movements indicating likely tearing of meniscus. We discussed possibility of  TKA in the future but with how well she is doing right now, I do not think  it is necessary. Some groin pain with repetitive sumo squats so asked her to stay away from those at home for now and focus on regular squats.    PT Treatment/Interventions ADLs/Self Care Home Management;Moist Heat;Iontophoresis 34m/ml Dexamethasone;Electrical Stimulation;Cryotherapy;Traction;Therapeutic activities;Functional mobility training;Stair training;Gait training;Therapeutic exercise;Balance training;Neuromuscular re-education;Patient/family education;Manual techniques;Taping;Dry needling;Passive range of motion    PT Next Visit Plan still working on 5 snacks/day?  glut strength in hip hinge    PT Home Exercise Plan [id number], increase 3d/week by 10 min walk, every 2 weeks incr 10 min  [id number] (walking stretches)    Consulted and Agree with Plan of Care Patient           Patient will benefit from skilled therapeutic intervention in order to improve the following deficits and impairments:  Difficulty walking,Increased muscle spasms,Improper body mechanics,Decreased activity tolerance,Impaired flexibility,Postural dysfunction,Pain,Decreased strength  Visit Diagnosis: Chronic pain of left knee  Chronic pain of right knee     Problem List Patient Active Problem List   Diagnosis Date Noted  . LUQ pain 09/15/2020  . Calculus of gallbladder without cholecystitis without obstruction 09/15/2020  . Hamstring tendinitis 09/10/2020  . Pyrosis 07/21/2020  . 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  . Right hip pain 06/12/2020  . Pre-diabetes 03/12/2020  . Seasonal allergies 03/12/2020  . Obesity 10/30/2019  . Low back pain 09/11/2019  . Degenerative joint disease of knee, left 09/11/2019  . Pancreatic cyst 01/25/2019  . Gastroesophageal reflux disease 01/25/2019  . Esophageal dysphagia 01/25/2019  . Unintentional weight loss 01/25/2019  . IGT (impaired glucose tolerance) 01/17/2019  . BPPV (benign paroxysmal positional vertigo) 10/23/2012  . Vertigo 10/21/2012  .  UTI (lower urinary tract infection) 10/21/2012  . Nausea & vomiting 10/21/2012  . Postoperative anemia due to acute blood loss 09/14/2012  . OA (osteoarthritis) of knee 09/12/2012  . Insomnia 08/20/2011  . OBSTRUCTIVE SLEEP APNEA 04/23/2008  . RESTLESS LEG SYNDROME 04/23/2008  . FIBROMYALGIA 01/06/2007     C.  PT, DPT 10/22/20 4:57 PM   [facility] [address] [address]Phone: [phone]  Fax:  [phone] Name: KTAMAKIA PORTOMRN: [id number] of Birth: [birth year]

## 2020-10-30 ENCOUNTER — Other Ambulatory Visit: Payer: Self-pay

## 2020-10-30 ENCOUNTER — Encounter: Payer: Self-pay | Admitting: Internal Medicine

## 2020-10-30 ENCOUNTER — Ambulatory Visit (INDEPENDENT_AMBULATORY_CARE_PROVIDER_SITE_OTHER): Payer: PPO | Admitting: Family Medicine

## 2020-10-30 VITALS — BP 123/57 | HR 71 | Temp 97.7°F | Ht 65.0 in | Wt 199.0 lb

## 2020-10-30 DIAGNOSIS — R7303 Prediabetes: Secondary | ICD-10-CM | POA: Diagnosis not present

## 2020-10-30 DIAGNOSIS — Z6832 Body mass index (BMI) 32.0-32.9, adult: Secondary | ICD-10-CM | POA: Diagnosis not present

## 2020-10-30 DIAGNOSIS — E669 Obesity, unspecified: Secondary | ICD-10-CM | POA: Diagnosis not present

## 2020-10-31 ENCOUNTER — Telehealth: Payer: PPO | Admitting: Internal Medicine

## 2020-10-31 ENCOUNTER — Encounter (HOSPITAL_BASED_OUTPATIENT_CLINIC_OR_DEPARTMENT_OTHER): Payer: PPO | Admitting: Physical Therapy

## 2020-11-03 ENCOUNTER — Encounter (HOSPITAL_BASED_OUTPATIENT_CLINIC_OR_DEPARTMENT_OTHER): Payer: Self-pay | Admitting: Physical Therapy

## 2020-11-03 ENCOUNTER — Ambulatory Visit (HOSPITAL_BASED_OUTPATIENT_CLINIC_OR_DEPARTMENT_OTHER): Payer: PPO | Attending: Family Medicine | Admitting: Physical Therapy

## 2020-11-03 ENCOUNTER — Other Ambulatory Visit: Payer: Self-pay

## 2020-11-03 DIAGNOSIS — M25562 Pain in left knee: Secondary | ICD-10-CM | POA: Insufficient documentation

## 2020-11-03 DIAGNOSIS — G8929 Other chronic pain: Secondary | ICD-10-CM | POA: Insufficient documentation

## 2020-11-03 DIAGNOSIS — M25561 Pain in right knee: Secondary | ICD-10-CM | POA: Diagnosis not present

## 2020-11-03 NOTE — Therapy (Signed)
[facility] [address] [place], [place], [address] Phone: [phone]   Fax:  [phone]  Physical Therapy Treatment  Patient Details  Name: Cassandra [name] MRN: [id number] Date of Birth: Aug 12, 1945 Referring Provider (PT): Charlann Boxer, DO   Encounter Date: 11/03/2020   PT End of Session - 11/03/20 1351    Visit Number 8    Number of Visits 21    Date for PT Re-Evaluation 12/12/20    Authorization Type [employer]    PT Start Time 1347    PT Stop Time 1427    PT Time Calculation (min) 40 min    Activity Tolerance Patient tolerated treatment well    Behavior During Therapy [facility] for tasks assessed/performed           Past Medical History:  Diagnosis Date  . Anemia   . Anxiety   . Asthma   . Back pain   . BPPV (benign paroxysmal positional vertigo)   . Chronic allergic conjunctivitis   . Depression   . Diverticulosis   . Fibromyalgia   . Gallbladder problem   . Gallstones   . GERD (gastroesophageal reflux disease)   . Hiatal hernia   . History of bladder stone   . Iron (Fe) deficiency anemia   . Joint pain   . Lump in female breast   . Mild persistent asthma    followed by pcp--- dr r. sharma ([place] allergy/asthma)  . OA (osteoarthritis)    knees, hands  . OAB (overactive bladder)   . OSA (obstructive sleep apnea)    per pt has not used cpap several years  . Pancreatic cyst   . PONV (postoperative nausea and vomiting)   . RLS (restless legs syndrome)   . Seasonal and perennial allergic rhinitis   . SUI (stress urinary incontinence, female)   . Swallowing difficulty   . Vitamin B12 deficiency   . Vitamin D deficiency   . Wears glasses     Past Surgical History:  Procedure Laterality Date  . BLADDER SUSPENSION  1990's   sling  . bladder tacking  2010   with mesh  . BREAST LUMPECTOMY Left 1985   Benign cyst  . BREAST REDUCTION SURGERY Bilateral 2003 approx.  . CORNEA LESION EXCISION  Right 2005  approx.  . CYSTOSCOPY N/A 07/12/2016   Procedure: CYSTOSCOPY, VAGINOSCOPY, EXAM UNDER ANESTHESIA, STONE LITHOTRIPSY,;  Surgeon: Cleon Gustin, MD;  Location: [facility];  Service: Urology;  Laterality: N/A;  . CYSTOSCOPY N/A 10/11/2016   Procedure: CYSTOSCOPY;  Surgeon: Cleon Gustin, MD;  Location: [facility];  Service: Urology;  Laterality: N/A;  . CYSTOSCOPY WITH LITHOLAPAXY N/A 10/11/2016   Procedure: CYSTOSCOPY WITH LITHOLAPAXY/ EXCISION OF MESH;  Surgeon: Cleon Gustin, MD;  Location: [facility];  Service: Urology;  Laterality: N/A;  . CYSTOSCOPY WITH LITHOLAPAXY N/A 01/22/2019   Procedure: CYSTOSCOPY WITH LITHOLAPAXY;  Surgeon: Cleon Gustin, MD;  Location: [facility];  Service: Urology;  Laterality: N/A;  1 HR  . HOLMIUM LASER APPLICATION  0/26/3785   Procedure: HOLMIUM LASER APPLICATION;  Surgeon: Cleon Gustin, MD;  Location: [facility];  Service: Urology;;  . HOLMIUM LASER APPLICATION N/A 8/85/0277   Procedure: HOLMIUM LASER APPLICATION;  Surgeon: Cleon Gustin, MD;  Location: [facility];  Service: Urology;  Laterality: N/A;  . RIGHT KNEE ARTHROSCOPY  2013  . TOTAL KNEE ARTHROPLASTY Right 09/12/2012  Procedure: RIGHT TOTAL KNEE ARTHROPLASTY;  Surgeon: Gearlean Alf, MD;  Location: [facility];  Service: Orthopedics;  Laterality: Right;  Marland Kitchen VAGINAL HYSTERECTOMY  1984    There were no vitals filed for this visit.   Subjective Assessment - 11/03/20 1349    Subjective My knee was doing well until this AM. It was clicking the whole time I was doing my 1 mile walk. I did eat all my meals.    Patient Stated Goals lose weight    Currently in Pain? Yes    Pain Score 3     Pain Location Knee    Pain Orientation Left    Pain Descriptors / Indicators --   clicking   Aggravating Factors  weight bearing    Pain Relieving Factors sit down               [facility] PT Assessment - 11/03/20 0001      High Level Balance   High Level Balance Comments SLS Rt 13, Lt 5                         [facility] Adult PT Treatment/Exercise - 11/03/20 0001      Knee/Hip Exercises: Stretches   Passive Hamstring Stretch Limitations seated EOB    Gastroc Stretch Limitations standing lunge      Knee/Hip Exercises: Standing   Heel Raises Limitations holding on to rise, balance and slow lower    Forward Step Up Limitations opp UE/LE step fwd resisted by red tband; also step back    SLS in // bars    Other Standing Knee Exercises tandem stance in // bars    Other Standing Knee Exercises fwd step onto airex with Lt foot                    PT Short Term Goals - 10/20/20 1434      PT SHORT TERM GOAL #1   Title neutral innominate rotation    Status Achieved             PT Long Term Goals - 09/30/20 1945      PT LONG TERM GOAL #1   Title pt will be on path to use of exercise for weight loss    Baseline has not had success with diet so far    Time 10    Period Weeks    Status New    Target Date 12/12/20      PT LONG TERM GOAL #2   Title able to sleep without "drawing up" of hamstrings    Baseline unable at eval    Time 10    Period Weeks    Status New    Target Date 12/12/20      PT LONG TERM GOAL #3   Title pt will demo gross LE strength 5/5    Baseline not appropriate to test at eval due to notable rotation    Time 10    Period Weeks    Status New    Target Date 12/12/20                 Plan - 11/03/20 1427    Clinical Impression Statement Incr balance challenges today, limited tolerance to SLS on Lt side but was able to continue with [facility] exercises. resisted stepping via UE anchor and cues to improve left foot strike and stance as Rt does- mirror was helpful.    PT Treatment/Interventions  ADLs/Self Care Home Management;Moist Heat;Iontophoresis 4mg /ml Dexamethasone;Electrical  Stimulation;Cryotherapy;Traction;Therapeutic activities;Functional mobility training;Stair training;Gait training;Therapeutic exercise;Balance training;Neuromuscular re-education;Patient/family education;Manual techniques;Taping;Dry needling;Passive range of motion    PT Next Visit Plan cont balance    PT Home Exercise Plan [id number], increase 3d/week by 10 min walk, every 2 weeks incr 10 min  [id number] (walking stretches)    Consulted and Agree with Plan of Care Patient           Patient will benefit from skilled therapeutic intervention in order to improve the following deficits and impairments:  Difficulty walking,Increased muscle spasms,Improper body mechanics,Decreased activity tolerance,Impaired flexibility,Postural dysfunction,Pain,Decreased strength  Visit Diagnosis: Chronic pain of left knee  Chronic pain of right knee     Problem List Patient Active Problem List   Diagnosis Date Noted  . LUQ pain 09/15/2020  . Calculus of gallbladder without cholecystitis without obstruction 09/15/2020  . Hamstring tendinitis 09/10/2020  . Pyrosis 07/21/2020  . 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  . Right hip pain 06/12/2020  . Pre-diabetes 03/12/2020  . Seasonal allergies 03/12/2020  . Obesity 10/30/2019  . Low back pain 09/11/2019  . Degenerative joint disease of knee, left 09/11/2019  . Pancreatic cyst 01/25/2019  . Gastroesophageal reflux disease 01/25/2019  . Esophageal dysphagia 01/25/2019  . Unintentional weight loss 01/25/2019  . IGT (impaired glucose tolerance) 01/17/2019  . BPPV (benign paroxysmal positional vertigo) 10/23/2012  . Vertigo 10/21/2012  . UTI (lower urinary tract infection) 10/21/2012  . Nausea & vomiting 10/21/2012  . Postoperative anemia due to acute blood loss 09/14/2012  . OA (osteoarthritis) of knee 09/12/2012  . Insomnia 08/20/2011  . OBSTRUCTIVE SLEEP APNEA 04/23/2008  . RESTLESS LEG SYNDROME 04/23/2008  . FIBROMYALGIA  01/06/2007     C.  PT, DPT 11/03/20 4:14 PM   [facility] [address] [place], [place], [address] Phone: [phone]   Fax:  [phone]  Name: Cassandra [name] MRN: [id number] Date of Birth: Oct 28, 1945

## 2020-11-04 NOTE — Progress Notes (Signed)
Chief Complaint:   OBESITY Cassandra [name] is here to discuss her progress with her obesity treatment plan along with follow-up of her obesity related diagnoses. Cassandra [name] is on practicing portion control and making smarter food choices, such as increasing vegetables and decreasing simple carbohydrates and states she is following her eating plan approximately 85% of the time. Cassandra [name] states she is walking 1 mile for 30-45 minutes 7 times per week.  Today's visit was #: 14 Starting weight: 193 lbs Starting date: 11/13/2019 Today's weight: 199 lbs Today's date: 11/04/2020 Total lbs lost to date: 0 Total lbs lost since last in-office visit: 0  Interim History: Cassandra [name] is retaining some water weight today. She is trying to portion control and make smarter choices, but she is struggling to lose weight. She still has carbohydrate craving and her protein is inadequate which is preventing her from losing weight.  Subjective:   1. Pre-diabetes Cassandra [name] is stable on metformin, and she denies nausea or vomiting. She is trying to lose weight but she is struggling to decrease simple carbohydrates.  Assessment/Plan:   1. Pre-diabetes Cassandra [name] will continue to work on weight loss, exercise, and decreasing simple carbohydrates to help decrease the risk of diabetes. We will refill metformin 500 mg BID #60 for 1 month.  2. Obesity with current BMI 33.12 Cassandra [name] is currently in the action stage of change. As such, her goal is to continue with weight loss efforts. She has agreed to keeping a food journal and adhering to recommended goals of 1000-1200 calories and 75+ grams of protein daily. She is to keep sugar intake below 50 grams per day.   Exercise goals: As is.  Behavioral modification strategies: increasing lean protein intake, increasing water intake and keeping a strict food journal.  Cassandra [name] has agreed to follow-up with our clinic in 4 weeks. She was informed of the importance of frequent follow-up visits to maximize  her success with intensive lifestyle modifications for her multiple health conditions.   Objective:   Blood pressure (!) 123/57, [name] 71, temperature 97.7 F (36.5 C), height 5\' 5"  (1.651 m), weight 199 lb (90.3 kg), SpO2 96 %. Body mass index is 33.12 kg/m.  General: Cooperative, alert, well developed, in no acute distress. HEENT: Conjunctivae and lids unremarkable. Cardiovascular: Regular rhythm.  Lungs: Normal work of breathing. Neurologic: No focal deficits.   Lab Results  Component Value Date   CREATININE 0.79 04/17/2020   BUN 21 04/17/2020   NA 138 04/17/2020   K 4.1 04/17/2020   CL 100 04/17/2020   CO2 25 04/17/2020   Lab Results  Component Value Date   ALT 6 04/17/2020   AST 14 04/17/2020   ALKPHOS 108 04/17/2020   BILITOT 0.4 04/17/2020   Lab Results  Component Value Date   HGBA1C 5.6 04/17/2020   HGBA1C 5.7 (H) 11/13/2019   HGBA1C 5.4 08/29/2019   HGBA1C 6.1 01/17/2019   HGBA1C 4.9 01/06/2007   Lab Results  Component Value Date   INSULIN 15.9 04/17/2020   INSULIN 30.1 (H) 11/13/2019   Lab Results  Component Value Date   TSH 1.680 11/13/2019   Lab Results  Component Value Date   CHOL 172 04/17/2020   HDL 70 04/17/2020   LDLCALC 91 04/17/2020   TRIG 56 04/17/2020   CHOLHDL 3 01/17/2019   Lab Results  Component Value Date   WBC 7.7 11/13/2019   HGB 14.5 11/13/2019   HCT 47.4 (H) 11/13/2019   MCV 86 11/13/2019   PLT 283  11/13/2019   Lab Results  Component Value Date   IRON 66 11/13/2019   TIBC 465 (H) 11/13/2019   FERRITIN 30 11/13/2019    Obesity Behavioral Intervention:   Approximately 15 minutes were spent on the discussion below.  ASK: We discussed the diagnosis of obesity with Cassandra [name] today and Cassandra [name] agreed to give [place] permission to discuss obesity behavioral modification therapy today.  ASSESS: Cassandra [name] has the diagnosis of obesity and her BMI today is 33.12. Cassandra [name] is in the action stage of change.   ADVISE: Cassandra [name] was educated  on the multiple health risks of obesity as well as the benefit of weight loss to improve her health. She was advised of the need for long term treatment and the importance of lifestyle modifications to improve her current health and to decrease her risk of future health problems.  AGREE: Multiple dietary modification options and treatment options were discussed and Cassandra [name] agreed to follow the recommendations documented in the above note.  ARRANGE: Cassandra [name] was educated on the importance of frequent visits to treat obesity as outlined per CMS and USPSTF guidelines and agreed to schedule her next follow up appointment today.  Attestation Statements:   Reviewed by clinician on day of visit: allergies, medications, problem list, medical history, surgical history, family history, social history, and previous encounter notes.   I, Trixie Dredge, am acting as transcriptionist for Dennard Nip, MD.  I have reviewed the above documentation for accuracy and completeness, and I agree with the above. -  Dennard Nip, MD

## 2020-11-05 ENCOUNTER — Other Ambulatory Visit (INDEPENDENT_AMBULATORY_CARE_PROVIDER_SITE_OTHER): Payer: Self-pay | Admitting: Family Medicine

## 2020-11-05 DIAGNOSIS — R7303 Prediabetes: Secondary | ICD-10-CM

## 2020-11-05 MED ORDER — METFORMIN HCL 500 MG PO TABS: 1.0000 | ORAL_TABLET | Freq: Two times a day (BID) | ORAL | 0 refills | Status: DC

## 2020-11-05 NOTE — Telephone Encounter (Signed)
Pt last seen by Dr. Wallace.  

## 2020-11-06 DIAGNOSIS — T63421D Toxic effect of venom of ants, accidental (unintentional), subsequent encounter: Secondary | ICD-10-CM | POA: Diagnosis not present

## 2020-11-06 DIAGNOSIS — J301 Allergic rhinitis due to pollen: Secondary | ICD-10-CM | POA: Diagnosis not present

## 2020-11-07 ENCOUNTER — Encounter (HOSPITAL_BASED_OUTPATIENT_CLINIC_OR_DEPARTMENT_OTHER): Payer: Self-pay | Admitting: Physical Therapy

## 2020-11-07 ENCOUNTER — Ambulatory Visit (HOSPITAL_BASED_OUTPATIENT_CLINIC_OR_DEPARTMENT_OTHER): Payer: PPO | Admitting: Physical Therapy

## 2020-11-07 ENCOUNTER — Other Ambulatory Visit: Payer: Self-pay

## 2020-11-07 DIAGNOSIS — M25562 Pain in left knee: Secondary | ICD-10-CM | POA: Diagnosis not present

## 2020-11-07 DIAGNOSIS — G8929 Other chronic pain: Secondary | ICD-10-CM

## 2020-11-07 DIAGNOSIS — M25561 Pain in right knee: Secondary | ICD-10-CM

## 2020-11-07 NOTE — Therapy (Signed)
[facility] [address] [place], [place], [address] Phone: [phone]   Fax:  [phone]  Physical Therapy Treatment  Patient Details  Name: Cassandra [name] MRN: [id number] Date of Birth: 08-11-1945 Referring Provider (PT): Charlann Boxer, DO   Encounter Date: 11/07/2020   PT End of Session - 11/07/20 1518     Visit Number 9    Number of Visits 21    Date for PT Re-Evaluation 12/12/20    Authorization Type [employer]    PT Start Time 1516    PT Stop Time 1556    PT Time Calculation (min) 40 min    Activity Tolerance Patient tolerated treatment well    Behavior During Therapy [facility] for tasks assessed/performed             Past Medical History:  Diagnosis Date   Anemia    Anxiety    Asthma    Back pain    BPPV (benign paroxysmal positional vertigo)    Chronic allergic conjunctivitis    Depression    Diverticulosis    Fibromyalgia    Gallbladder problem    Gallstones    GERD (gastroesophageal reflux disease)    Hiatal hernia    History of bladder stone    Iron (Fe) deficiency anemia    Joint pain    Lump in female breast    Mild persistent asthma    followed by pcp--- dr Alfonso Patten. sharma ([place] allergy/asthma)   OA (osteoarthritis)    knees, hands   OAB (overactive bladder)    OSA (obstructive sleep apnea)    per pt has not used cpap several years   Pancreatic cyst    PONV (postoperative nausea and vomiting)    RLS (restless legs syndrome)    Seasonal and perennial allergic rhinitis    SUI (stress urinary incontinence, female)    Swallowing difficulty    Vitamin B12 deficiency    Vitamin D deficiency    Wears glasses     Past Surgical History:  Procedure Laterality Date   BLADDER SUSPENSION  1990's   sling   bladder tacking  2010   with mesh   BREAST LUMPECTOMY Left 1985   Benign cyst   BREAST REDUCTION SURGERY Bilateral 2003 approx.   CORNEA LESION EXCISION Right 2005  approx.    CYSTOSCOPY N/A 07/12/2016   Procedure: CYSTOSCOPY, VAGINOSCOPY, EXAM UNDER ANESTHESIA, STONE LITHOTRIPSY,;  Surgeon: Cleon Gustin, MD;  Location: [facility];  Service: Urology;  Laterality: N/A;   CYSTOSCOPY N/A 10/11/2016   Procedure: CYSTOSCOPY;  Surgeon: Cleon Gustin, MD;  Location: [facility]  Service: Urology;  Laterality: N/A;   CYSTOSCOPY WITH LITHOLAPAXY N/A 10/11/2016   Procedure: CYSTOSCOPY WITH LITHOLAPAXY/ EXCISION OF MESH;  Surgeon: Cleon Gustin, MD;  Location: [facility];  Service: Urology;  Laterality: N/A;   CYSTOSCOPY WITH LITHOLAPAXY N/A 01/22/2019   Procedure: CYSTOSCOPY WITH LITHOLAPAXY;  Surgeon: Cleon Gustin, MD;  Location: [facility];  Service: Urology;  Laterality: N/A;  1 HR   HOLMIUM LASER APPLICATION  2/99/3716   Procedure: HOLMIUM LASER APPLICATION;  Surgeon: Cleon Gustin, MD;  Location: [facility];  Service: Urology;;   HOLMIUM LASER APPLICATION N/A 9/67/8938   Procedure: HOLMIUM LASER APPLICATION;  Surgeon: Cleon Gustin, MD;  Location: [facility];  Service: Urology;  Laterality: N/A;   RIGHT KNEE ARTHROSCOPY  2013   TOTAL KNEE ARTHROPLASTY Right  09/12/2012   Procedure: RIGHT TOTAL KNEE ARTHROPLASTY;  Surgeon: Gearlean Alf, MD;  Location: [facility];  Service: Orthopedics;  Laterality: Right;   VAGINAL HYSTERECTOMY  1984    There were no vitals filed for this visit.   Subjective Assessment - 11/07/20 1520     Subjective I walked this AM an ate a sandwich before I came.    Patient Stated Goals lose weight    Currently in Pain? No/denies                               [facility] Adult PT Treatment/Exercise - 11/07/20 0001       Knee/Hip Exercises: Aerobic   Nustep 7 min L7 UE & LE      Knee/Hip Exercises: Standing   Other Standing Knee Exercises walking with cones bw feet for wider step width    Other Standing  Knee Exercises at bar: pull off squats, push up hip ext, bent over hip abd                    PT Education - 11/07/20 2031     Education Details aquatics    Person(s) Educated Patient    Methods Explanation    Comprehension Verbalized understanding;Need further instruction              PT Short Term Goals - 10/20/20 1434       PT SHORT TERM GOAL #1   Title neutral innominate rotation    Status Achieved               PT Long Term Goals - 09/30/20 1945       PT LONG TERM GOAL #1   Title pt will be on path to use of exercise for weight loss    Baseline has not had success with diet so far    Time 10    Period Weeks    Status New    Target Date 12/12/20      PT LONG TERM GOAL #2   Title able to sleep without "drawing up" of hamstrings    Baseline unable at eval    Time 10    Period Weeks    Status New    Target Date 12/12/20      PT LONG TERM GOAL #3   Title pt will demo gross LE strength 5/5    Baseline not appropriate to test at eval due to notable rotation    Time 10    Period Weeks    Status New    Target Date 12/12/20                   Plan - 11/07/20 2021     Clinical Impression Statement Next visit is 10th visit PN. Discussed aquatic therapy today and walked to pool to see if her asthma would tolerate the atmosphere. Widening stance width reduced lateral trunk lean and denied incr in knee pain.    PT Treatment/Interventions ADLs/Self Care Home Management;Moist Heat;Iontophoresis 4mg /ml Dexamethasone;Electrical Stimulation;Cryotherapy;Traction;Therapeutic activities;Functional mobility training;Stair training;Gait training;Therapeutic exercise;Balance training;Neuromuscular re-education;Patient/family education;Manual techniques;Taping;Dry needling;Passive range of motion    PT Next Visit Plan 10th visit PN, aquatics?    PT Home Exercise Plan [id number], increase 3d/week by 10 min walk, every 2 weeks incr 10 min  [id number] (walking  stretches)    Consulted and Agree with Plan of Care Patient  Patient will benefit from skilled therapeutic intervention in order to improve the following deficits and impairments:  Difficulty walking, Increased muscle spasms, Improper body mechanics, Decreased activity tolerance, Impaired flexibility, Postural dysfunction, Pain, Decreased strength  Visit Diagnosis: Chronic pain of left knee  Chronic pain of right knee     Problem List Patient Active Problem List   Diagnosis Date Noted   LUQ pain 09/15/2020   Calculus of gallbladder without cholecystitis without obstruction 09/15/2020   Hamstring tendinitis 09/10/2020   Pyrosis 07/21/2020   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   Right hip pain 06/12/2020   Pre-diabetes 03/12/2020   Seasonal allergies 03/12/2020   Obesity 10/30/2019   Low back pain 09/11/2019   Degenerative joint disease of knee, left 09/11/2019   Pancreatic cyst 01/25/2019   Gastroesophageal reflux disease 01/25/2019   Esophageal dysphagia 01/25/2019   Unintentional weight loss 01/25/2019   IGT (impaired glucose tolerance) 01/17/2019   BPPV (benign paroxysmal positional vertigo) 10/23/2012   Vertigo 10/21/2012   UTI (lower urinary tract infection) 10/21/2012   Nausea & vomiting 10/21/2012   Postoperative anemia due to acute blood loss 09/14/2012   OA (osteoarthritis) of knee 09/12/2012   Insomnia 08/20/2011   OBSTRUCTIVE SLEEP APNEA 04/23/2008   RESTLESS LEG SYNDROME 04/23/2008   FIBROMYALGIA 01/06/2007    C.  PT, DPT 11/07/20 8:33 PM   [facility] [address] [place], [place], [address] Phone: [phone]   Fax:  [phone]  Name: Cassandra [name] MRN: [id number] Date of Birth: 19-Aug-1945

## 2020-11-10 ENCOUNTER — Encounter (HOSPITAL_BASED_OUTPATIENT_CLINIC_OR_DEPARTMENT_OTHER): Payer: Self-pay | Admitting: Physical Therapy

## 2020-11-10 ENCOUNTER — Ambulatory Visit (HOSPITAL_BASED_OUTPATIENT_CLINIC_OR_DEPARTMENT_OTHER): Payer: PPO | Admitting: Physical Therapy

## 2020-11-10 ENCOUNTER — Other Ambulatory Visit: Payer: Self-pay

## 2020-11-10 DIAGNOSIS — M25562 Pain in left knee: Secondary | ICD-10-CM | POA: Diagnosis not present

## 2020-11-10 DIAGNOSIS — G8929 Other chronic pain: Secondary | ICD-10-CM

## 2020-11-10 DIAGNOSIS — M25561 Pain in right knee: Secondary | ICD-10-CM

## 2020-11-10 NOTE — Progress Notes (Signed)
[facility] [place] Phone: [phone] Subjective:   [name], am serving as a scribe for Dr. Hulan Saas.  This visit occurred during the SARS-CoV-2 public health emergency.  Safety protocols were in place, including screening questions prior to the visit, additional usage of staff PPE, and extensive cleaning of exam room while observing appropriate contact time as indicated for disinfecting solutions.   I'm seeing this patient by the request  of:  Isaac Bliss, Rayford Halsted, MD  CC: Low back pain and bilateral knee pain   YHC:[id number]  09/10/2020 distal.  Discussed topical anti-inflammatories.  Discussed that I do believe there is some underlying arthritis is likely contributing as well.  Discussed home exercise and icing regimen.  Follow-up again in 4 to 8 weeks Start formal physical therapy and see how patient does.  Patient's last injection was quite some time ago.  Can repeat.  Discussed potential viscosupplementation.  Do believe that patient is having some hamstring tendinopathy bilaterally.  Will consider the possibility of compression sleeves but I do feel that formal physical therapy would be more beneficial.  Low back pain continues to do relatively well after the epidural in November.  Patient wants to be more active.  We will get her in with formal physical therapy for this as well as some hamstring tendinopathy it appears she is having.  Does have known arthritic changes of the left knee.  We will continue to monitor but worsening pain consider injection.  Follow-up with me again in 2 to 3 months  Update 11/11/2020 Cassandra [name] is a 75 y.o. female coming in with complaint of LBP and B knee pain, L>R. Patient states that physical therapy has been helping her back pain. Does still have restless leg symptoms. Wakes her up at night. Has not had pull in back of her legs like she did prior to the epidural. Does  take gabapentin at night.   Knee pain is the same as last visit. Left knee will be more painful when walking on uneven surfaces.    Had epidural for the back in 11/21     Past Medical History:  Diagnosis Date   Anemia    Anxiety    Asthma    Back pain    BPPV (benign paroxysmal positional vertigo)    Chronic allergic conjunctivitis    Depression    Diverticulosis    Fibromyalgia    Gallbladder problem    Gallstones    GERD (gastroesophageal reflux disease)    Hiatal hernia    History of bladder stone    Iron (Fe) deficiency anemia    Joint pain    Lump in female breast    Mild persistent asthma    followed by pcp--- dr Alfonso Patten. sharma ([place] allergy/asthma)   OA (osteoarthritis)    knees, hands   OAB (overactive bladder)    OSA (obstructive sleep apnea)    per pt has not used cpap several years   Pancreatic cyst    PONV (postoperative nausea and vomiting)    RLS (restless legs syndrome)    Seasonal and perennial allergic rhinitis    SUI (stress urinary incontinence, female)    Swallowing difficulty    Vitamin B12 deficiency    Vitamin D deficiency    Wears glasses    Past Surgical History:  Procedure Laterality Date   BLADDER SUSPENSION  1990's   sling   bladder tacking  2010  with mesh   BREAST LUMPECTOMY Left 1985   Benign cyst   BREAST REDUCTION SURGERY Bilateral 2003 approx.   CORNEA LESION EXCISION Right 2005  approx.   CYSTOSCOPY N/A 07/12/2016   Procedure: CYSTOSCOPY, VAGINOSCOPY, EXAM UNDER ANESTHESIA, STONE LITHOTRIPSY,;  Surgeon: Cleon Gustin, MD;  Location: [facility];  Service: Urology;  Laterality: N/A;   CYSTOSCOPY N/A 10/11/2016   Procedure: CYSTOSCOPY;  Surgeon: Cleon Gustin, MD;  Location: [facility];  Service: Urology;  Laterality: N/A;   CYSTOSCOPY WITH LITHOLAPAXY N/A 10/11/2016   Procedure: CYSTOSCOPY WITH LITHOLAPAXY/ EXCISION OF MESH;  Surgeon: Cleon Gustin, MD;  Location: [facility];  Service: Urology;  Laterality: N/A;   CYSTOSCOPY WITH LITHOLAPAXY N/A 01/22/2019   Procedure: CYSTOSCOPY WITH LITHOLAPAXY;  Surgeon: Cleon Gustin, MD;  Location: [facility];  Service: Urology;  Laterality: N/A;  1 HR   HOLMIUM LASER APPLICATION  9/81/1914   Procedure: HOLMIUM LASER APPLICATION;  Surgeon: Cleon Gustin, MD;  Location: [facility];  Service: Urology;;   HOLMIUM LASER APPLICATION N/A 7/82/9562   Procedure: HOLMIUM LASER APPLICATION;  Surgeon: Cleon Gustin, MD;  Location: [facility];  Service: Urology;  Laterality: N/A;   RIGHT KNEE ARTHROSCOPY  2013   TOTAL KNEE ARTHROPLASTY Right 09/12/2012   Procedure: RIGHT TOTAL KNEE ARTHROPLASTY;  Surgeon: Gearlean Alf, MD;  Location: [facility];  Service: Orthopedics;  Laterality: Right;   VAGINAL HYSTERECTOMY  1984   Social History   Socioeconomic History   Marital status: Widowed    Spouse name: Not on file   Number of children: Not on file   Years of education: Not on file   Highest education level: Not on file  Occupational History   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  Tobacco Use   Smoking status: Never   Smokeless tobacco: Never  Vaping Use   Vaping Use: Never used  Substance and Sexual Activity   Alcohol use: Not Currently   Drug use: Never   Sexual activity: Not on file  Other Topics Concern   Not on file  Social History Narrative   Not on file   Social Determinants of Health   Financial Resource Strain: Not on file  Food Insecurity: Not on file  Transportation Needs: Not on file  Physical Activity: Not on file  Stress: Not on file  Social Connections: Not on file   Allergies  Allergen Reactions   Shellfish Allergy Hives   Cephalosporins Hives   Hydrochlorothiazide Hives   Penicillins Hives   Sulfa Antibiotics Hives   Family History  Problem Relation Age of Onset   Kidney cancer Mother    Diabetes Mother     Obesity Mother    Alcoholism Father    Colon cancer Neg Hx    Inflammatory bowel disease Neg Hx    Liver disease Neg Hx    Pancreatic cancer Neg Hx    Stomach cancer Neg Hx    Rectal cancer Neg Hx     Current Outpatient Medications (Endocrine & Metabolic):    metFORMIN (GLUCOPHAGE) 500 MG tablet, Take 1 tablet (500 mg total) by mouth 2 (two) times daily with a meal.  Current Outpatient Medications (Cardiovascular):    EPINEPHrine 0.3 mg/0.3 mL IJ SOAJ injection, Inject 0.3 mg into the muscle as needed for anaphylaxis.   spironolactone (ALDACTONE) 25 MG tablet, TAKE 1 TABLET BY MOUTH EVERY DAY  Current Outpatient Medications (Respiratory):  fluticasone (FLONASE) 50 MCG/ACT nasal spray, Place into both nostrils.   levocetirizine (XYZAL) 5 MG tablet, Take 5 mg by mouth every evening.     Current Outpatient Medications (Other):    gabapentin (NEURONTIN) 100 MG capsule, TAKE 2 CAPSULES BY MOUTH AT BEDTIME   Misc Natural Products (TART CHERRY ADVANCED) CAPS, Take 1 capsule by mouth. 1200mg  capsule   omeprazole (PRILOSEC) 20 MG capsule, Take 20 mg by mouth 2 (two) times daily before a meal.   psyllium (REGULOID) 0.52 g capsule, Take 0.52 g by mouth daily.   Reviewed prior external information including notes and imaging from  primary care provider As well as notes that were available from care everywhere and other healthcare systems.  Past medical history, social, surgical and family history all reviewed in electronic medical record.  No pertanent information unless stated regarding to the chief complaint.   Review of Systems:  No headache, visual changes, nausea, vomiting, diarrhea, constipation, dizziness, abdominal pain, skin rash, fevers, chills, night sweats, weight loss, swollen lymph nodes, body aches, joint swelling, chest pain, shortness of breath, mood changes. POSITIVE muscle aches, muscle cramps at night  Objective  Blood pressure 126/78, pulse 81, height 5\' 5"  (1.651  m), weight 204 lb (92.5 kg), SpO2 97 %.   General: No apparent distress alert and oriented x3 mood and affect normal, dressed appropriately.  HEENT: Pupils equal, extraocular movements intact  Respiratory: Patient's speak in full sentences and does not appear short of breath  Cardiovascular: No lower extremity edema, non tender, no erythema  Gait antalgic  MSK: Patient does have some mild tenderness noted over the left knee.  Instability still noted with valgus and varus force.  Low back seems to be doing relatively okay.  Patient has negative straight leg test.  Comfortable sitting in the chair today.     Impression and Recommendations:     The above documentation has been reviewed and is accurate and complete Lyndal Pulley, DO

## 2020-11-10 NOTE — Therapy (Signed)
[place], [place], [address] Phone: [phone]   Fax:  [phone]  Physical Therapy Treatment Progress Note Reporting Period 09/30/20 to 11/10/2020   See note below for Objective Data and Assessment of Progress/Goals.      Patient Details  Name: Cassandra [name] MRN: [id number] Date of Birth: 1946-03-11 Referring Provider (PT): Charlann Boxer, DO   Encounter Date: 11/10/2020   PT End of Session - 11/10/20 1231     Visit Number 10    Number of Visits 21    Date for PT Re-Evaluation 12/12/20    Authorization Type [employer]    PT Start Time 1230    PT Stop Time 1308    PT Time Calculation (min) 38 min    Activity Tolerance Patient tolerated treatment well    Behavior During Therapy WFL for tasks assessed/performed             Past Medical History:  Diagnosis Date   Anemia    Anxiety    Asthma    Back pain    BPPV (benign paroxysmal positional vertigo)    Chronic allergic conjunctivitis    Depression    Diverticulosis    Fibromyalgia    Gallbladder problem    Gallstones    GERD (gastroesophageal reflux disease)    Hiatal hernia    History of bladder stone    Iron (Fe) deficiency anemia    Joint pain    Lump in female breast    Mild persistent asthma    followed by pcp--- dr Alfonso Patten. sharma ([place] allergy/asthma)   OA (osteoarthritis)    knees, hands   OAB (overactive bladder)    OSA (obstructive sleep apnea)    per pt has not used cpap several years   Pancreatic cyst    PONV (postoperative nausea and vomiting)    RLS (restless legs syndrome)    Seasonal and perennial allergic rhinitis    SUI (stress urinary incontinence, female)    Swallowing difficulty    Vitamin B12 deficiency    Vitamin D deficiency    Wears glasses     Past Surgical History:  Procedure Laterality Date   BLADDER SUSPENSION  1990's   sling   bladder tacking  2010   with mesh   BREAST LUMPECTOMY  Left 1985   Benign cyst   BREAST REDUCTION SURGERY Bilateral 2003 approx.   CORNEA LESION EXCISION Right 2005  approx.   CYSTOSCOPY N/A 07/12/2016   Procedure: CYSTOSCOPY, VAGINOSCOPY, EXAM UNDER ANESTHESIA, STONE LITHOTRIPSY,;  Surgeon: Cleon Gustin, MD;  Location: [facility];  Service: Urology;  Laterality: N/A;   CYSTOSCOPY N/A 10/11/2016   Procedure: CYSTOSCOPY;  Surgeon: Cleon Gustin, MD;  Location: [facility];  Service: Urology;  Laterality: N/A;   CYSTOSCOPY WITH LITHOLAPAXY N/A 10/11/2016   Procedure: CYSTOSCOPY WITH LITHOLAPAXY/ EXCISION OF MESH;  Surgeon: Cleon Gustin, MD;  Location: [facility];  Service: Urology;  Laterality: N/A;   CYSTOSCOPY WITH LITHOLAPAXY N/A 01/22/2019   Procedure: CYSTOSCOPY WITH LITHOLAPAXY;  Surgeon: Cleon Gustin, MD;  Location: [facility];  Service: Urology;  Laterality: N/A;  1 HR   HOLMIUM LASER APPLICATION  1/95/0932   Procedure: HOLMIUM LASER APPLICATION;  Surgeon: Cleon Gustin, MD;  Location: [facility];  Service: Urology;;   HOLMIUM LASER APPLICATION N/A 6/71/2458   Procedure: HOLMIUM LASER APPLICATION;  Surgeon: Cleon Gustin, MD;  Location:  [place];  Service: Urology;  Laterality: N/A;   RIGHT KNEE ARTHROSCOPY  2013   TOTAL KNEE ARTHROPLASTY Right 09/12/2012   Procedure: RIGHT TOTAL KNEE ARTHROPLASTY;  Surgeon: Gearlean Alf, MD;  Location: [facility];  Service: Orthopedics;  Laterality: Right;   VAGINAL HYSTERECTOMY  1984    There were no vitals filed for this visit.   Subjective Assessment - 11/10/20 1229     Subjective My legs were sore after last visit.    Patient Stated Goals lose weight    Currently in Pain? Yes    Pain Score 3     Pain Location Knee    Pain Orientation Left    Pain Descriptors / Indicators Aching;Dull                [facility] PT Assessment - 11/10/20 0001       Assessment    Medical Diagnosis chronic knee pain, bilat (P)     Referring Provider (PT) Zach smith, DO (P)       Precautions   Precautions None (P)       Restrictions   Weight Bearing Restrictions No (P)       Balance Screen   Has the patient fallen in the past 6 months No (P)       Home Environment   Living Environment Private residence (P)       Prior Function   Level of Independence Independent (P)     Vocation Retired (P)       Cognition   Overall Cognitive Status Within Functional Limits for tasks assessed (P)       Sensation   Additional Comments WFL (P)       ROM / Strength   AROM / PROM / Strength Strength (P)       Strength   Strength Assessment Site Hip (P)     Right/Left Hip Right;Left (P)     Right Hip Flexion 5/5 (P)     Right Hip Extension 4/5 (P)     Right Hip ABduction 4/5 (P)     Left Hip Flexion 5/5 (P)     Left Hip Extension 4/5 (P)     Left Hip ABduction 4-/5 (P)                            [facility] Adult PT Treatment/Exercise - 11/10/20 0001       Knee/Hip Exercises: Aerobic   Recumbent Bike 6 min L3 following UBE    Other Aerobic UBE 3/3 L2      Knee/Hip Exercises: Seated   Sit to Sand without UE support   +OH reach     Knee/Hip Exercises: Supine   Other Supine Knee/Hip Exercises LE scissors    Other Supine Knee/Hip Exercises hundreds                    PT Education - 11/10/20 1354     Education Details keeping heart rate up for fat burn, goals    Person(s) Educated Patient    Methods Explanation    Comprehension Verbalized understanding;Need further instruction              PT Short Term Goals - 11/10/20 1231       PT SHORT TERM GOAL #1   Title neutral innominate rotation    Status Achieved  PT Long Term Goals - 11/10/20 1231       PT LONG TERM GOAL #1   Title pt will be on path to use of exercise for weight loss    Baseline doing exercise but no weight loss at this point    Status  On-going    Target Date 12/12/20      PT LONG TERM GOAL #2   Title able to sleep without "drawing up" of hamstrings    Baseline still noticing RLS at night    Status On-going    Target Date 12/12/20      PT LONG TERM GOAL #3   Title pt will demo gross LE strength 5/5    Baseline see flowsheet    Status On-going    Target Date 12/12/20      PT LONG TERM GOAL #4   Title pt will verbalize feeling balanced when walking    Baseline I still feel off balance when I am walking arond, maybe from vertigo per pt as it is mosly after she has gotten sweaty.    Status New    Target Date 12/12/20                   Plan - 11/10/20 1355     Clinical Impression Statement Pt is making strength progress notable in that she can maintain a level pelvis with her daily activities. Is able to do her walking with less limitation by knee or hip pain. She has not been losing weight and has a f/u with weight loss soon. today we focused on watching her apple watch to see that her heart rate is staying elevated rather than peaking and then falling off quickly. Our belief is that she is starting her walks fast and then slowing down quickly due to knee pain. For now, she will do her home bike for about  5 min before her walk, keep her HR up and then finish with 2 core exercises and sit to stands. She is eating small snacks throughout her day as requested but denies any increase in hunger. Pt will cont to benefit from skilled PT to further progress gross strength and stability so she is able to tolerate more exercises as well as improve balance for safety in exercises.    PT Treatment/Interventions ADLs/Self Care Home Management;Moist Heat;Iontophoresis 4mg /ml Dexamethasone;Electrical Stimulation;Cryotherapy;Traction;Therapeutic activities;Functional mobility training;Stair training;Gait training;Therapeutic exercise;Balance training;Neuromuscular re-education;Patient/family education;Manual techniques;Taping;Dry  needling;Passive range of motion    PT Next Visit Plan aquatics? [facility] hip abd strength    PT Home Exercise Plan [id number], increase 3d/week by 10 min walk, every 2 weeks incr 10 min  [id number] (walking stretches)    Consulted and Agree with Plan of Care Patient             Patient will benefit from skilled therapeutic intervention in order to improve the following deficits and impairments:  Difficulty walking, Increased muscle spasms, Improper body mechanics, Decreased activity tolerance, Impaired flexibility, Postural dysfunction, Pain, Decreased strength  Visit Diagnosis: Chronic pain of left knee  Chronic pain of right knee     Problem List Patient Active Problem List   Diagnosis Date Noted   LUQ pain 09/15/2020   Calculus of gallbladder without cholecystitis without obstruction 09/15/2020   Hamstring tendinitis 09/10/2020   Pyrosis 07/21/2020   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   Right hip pain 06/12/2020   Pre-diabetes 03/12/2020   Seasonal allergies 03/12/2020   Obesity 10/30/2019   Low back pain  09/11/2019   Degenerative joint disease of knee, left 09/11/2019   Pancreatic cyst 01/25/2019   Gastroesophageal reflux disease 01/25/2019   Esophageal dysphagia 01/25/2019   Unintentional weight loss 01/25/2019   IGT (impaired glucose tolerance) 01/17/2019   BPPV (benign paroxysmal positional vertigo) 10/23/2012   Vertigo 10/21/2012   UTI (lower urinary tract infection) 10/21/2012   Nausea & vomiting 10/21/2012   Postoperative anemia due to acute blood loss 09/14/2012   OA (osteoarthritis) of knee 09/12/2012   Insomnia 08/20/2011   OBSTRUCTIVE SLEEP APNEA 04/23/2008   RESTLESS LEG SYNDROME 04/23/2008   FIBROMYALGIA 01/06/2007    C.  PT, DPT 11/10/20 2:15 PM   [facility] [address] [place], [place], [address] Phone: [phone]   Fax:  [phone]  Name: Cassandra [name] MRN: [id number] Date of Birth: 11-21-1945

## 2020-11-11 ENCOUNTER — Encounter: Payer: Self-pay | Admitting: Family Medicine

## 2020-11-11 ENCOUNTER — Ambulatory Visit: Payer: PPO | Admitting: Family Medicine

## 2020-11-11 DIAGNOSIS — G8929 Other chronic pain: Secondary | ICD-10-CM | POA: Diagnosis not present

## 2020-11-11 DIAGNOSIS — M545 Low back pain, unspecified: Secondary | ICD-10-CM

## 2020-11-11 DIAGNOSIS — M1712 Unilateral primary osteoarthritis, left knee: Secondary | ICD-10-CM | POA: Diagnosis not present

## 2020-11-11 DIAGNOSIS — G2581 Restless legs syndrome: Secondary | ICD-10-CM | POA: Diagnosis not present

## 2020-11-11 DIAGNOSIS — J301 Allergic rhinitis due to pollen: Secondary | ICD-10-CM | POA: Diagnosis not present

## 2020-11-11 DIAGNOSIS — J3089 Other allergic rhinitis: Secondary | ICD-10-CM | POA: Diagnosis not present

## 2020-11-11 NOTE — Assessment & Plan Note (Signed)
Patient has been diagnosed with this previously.  Her last sleep study was in 2012.  Encouraged her to consider this.  Patient will write [place] if she would like referral.  Patient has been known to have sleep apnea as well but does not use her CPAP.

## 2020-11-11 NOTE — Assessment & Plan Note (Signed)
Also doing relatively well.  Does have the gabapentin.  Encouraged her to continue with the home exercises and can continue to focus on the weight loss.  Follow-up again in 3 months

## 2020-11-11 NOTE — Assessment & Plan Note (Signed)
Patient is stable at this time.  Known to have significant arthritic changes.  Patient feels like she is doing relatively well and we will hold on the other injection.  Encouraged her to continue to stay active.  Follow-up with me again 6 to 8 weeks.

## 2020-11-11 NOTE — Patient Instructions (Signed)
Good to see you Consider sleep study we can order it  Talk with Dr. Juleen China to see what he will do  See me again in 2 months just in case

## 2020-11-12 ENCOUNTER — Other Ambulatory Visit: Payer: Self-pay

## 2020-11-12 ENCOUNTER — Ambulatory Visit (HOSPITAL_BASED_OUTPATIENT_CLINIC_OR_DEPARTMENT_OTHER): Payer: PPO | Admitting: Physical Therapy

## 2020-11-12 ENCOUNTER — Encounter (HOSPITAL_BASED_OUTPATIENT_CLINIC_OR_DEPARTMENT_OTHER): Payer: Self-pay | Admitting: Physical Therapy

## 2020-11-12 DIAGNOSIS — G8929 Other chronic pain: Secondary | ICD-10-CM

## 2020-11-12 DIAGNOSIS — M25562 Pain in left knee: Secondary | ICD-10-CM | POA: Diagnosis not present

## 2020-11-12 NOTE — Therapy (Signed)
[facility] [address] [place], [place], [address] Phone: [phone]   Fax:  [phone]  Physical Therapy Treatment  Patient Details  Name: Cassandra [name] MRN: [id number] Date of Birth: 13-Jan-1946 Referring Provider (PT): Charlann Boxer, DO   Encounter Date: 11/12/2020   PT End of Session - 11/12/20 1510     Visit Number 11    Number of Visits 21    Date for PT Re-Evaluation 12/12/20    Authorization Type [employer]    PT Start Time 1510    PT Stop Time 1550    PT Time Calculation (min) 40 min    Activity Tolerance Patient tolerated treatment well    Behavior During Therapy [facility] for tasks assessed/performed             Past Medical History:  Diagnosis Date   Anemia    Anxiety    Asthma    Back pain    BPPV (benign paroxysmal positional vertigo)    Chronic allergic conjunctivitis    Depression    Diverticulosis    Fibromyalgia    Gallbladder problem    Gallstones    GERD (gastroesophageal reflux disease)    Hiatal hernia    History of bladder stone    Iron (Fe) deficiency anemia    Joint pain    Lump in female breast    Mild persistent asthma    followed by pcp--- dr Alfonso Patten. sharma ([place] allergy/asthma)   OA (osteoarthritis)    knees, hands   OAB (overactive bladder)    OSA (obstructive sleep apnea)    per pt has not used cpap several years   Pancreatic cyst    PONV (postoperative nausea and vomiting)    RLS (restless legs syndrome)    Seasonal and perennial allergic rhinitis    SUI (stress urinary incontinence, female)    Swallowing difficulty    Vitamin B12 deficiency    Vitamin D deficiency    Wears glasses     Past Surgical History:  Procedure Laterality Date   BLADDER SUSPENSION  1990's   sling   bladder tacking  2010   with mesh   BREAST LUMPECTOMY Left 1985   Benign cyst   BREAST REDUCTION SURGERY Bilateral 2003 approx.   CORNEA LESION EXCISION Right 2005  approx.    CYSTOSCOPY N/A 07/12/2016   Procedure: CYSTOSCOPY, VAGINOSCOPY, EXAM UNDER ANESTHESIA, STONE LITHOTRIPSY,;  Surgeon: Cleon Gustin, MD;  Location: [facility];  Service: Urology;  Laterality: N/A;   CYSTOSCOPY N/A 10/11/2016   Procedure: CYSTOSCOPY;  Surgeon: Cleon Gustin, MD;  Location: [facility];  Service: Urology;  Laterality: N/A;   CYSTOSCOPY WITH LITHOLAPAXY N/A 10/11/2016   Procedure: CYSTOSCOPY WITH LITHOLAPAXY/ EXCISION OF MESH;  Surgeon: Cleon Gustin, MD;  Location: [facility];  Service: Urology;  Laterality: N/A;   CYSTOSCOPY WITH LITHOLAPAXY N/A 01/22/2019   Procedure: CYSTOSCOPY WITH LITHOLAPAXY;  Surgeon: Cleon Gustin, MD;  Location: [facility];  Service: Urology;  Laterality: N/A;  1 HR   HOLMIUM LASER APPLICATION  2/97/9892   Procedure: HOLMIUM LASER APPLICATION;  Surgeon: Cleon Gustin, MD;  Location: [facility];  Service: Urology;;   HOLMIUM LASER APPLICATION N/A 06/18/4172   Procedure: HOLMIUM LASER APPLICATION;  Surgeon: Cleon Gustin, MD;  Location: [facility];  Service: Urology;  Laterality: N/A;   RIGHT KNEE ARTHROSCOPY  2013   TOTAL KNEE ARTHROPLASTY Right  09/12/2012   Procedure: RIGHT TOTAL KNEE ARTHROPLASTY;  Surgeon: Gearlean Alf, MD;  Location: [facility];  Service: Orthopedics;  Laterality: Right;   VAGINAL HYSTERECTOMY  1984    There were no vitals filed for this visit.   Subjective Assessment - 11/12/20 2024     Subjective I was feeling great but this morning I woke up with swelling in my knee and my ankle hurt.    Patient Stated Goals lose weight    Currently in Pain? Yes   "a good bit"   Aggravating Factors  heel strike    Pain Relieving Factors rest                [facility] PT Assessment - 11/12/20 0001       Observation/Other Assessments-Edema    Edema Circumferential      Circumferential Edema   Circumferential - Left   distal to knee notable edema                           [facility] Adult PT Treatment/Exercise - 11/12/20 0001       Knee/Hip Exercises: Stretches   Gastroc Stretch Limitations slant board      Knee/Hip Exercises: Aerobic   Nustep 5 min L7 UE & LE      Manual Therapy   Manual Therapy Edema management    Manual therapy comments self correction for post innom    Edema Management edema mob of Lt leg just distal to knee    Soft tissue mobilization Lt medial gastroc                    PT Education - 11/12/20 2044     Education Details discussed pain onset, DVT s/s    Person(s) Educated Patient    Methods Explanation    Comprehension Verbalized understanding;Need further instruction              PT Short Term Goals - 11/10/20 1231       PT SHORT TERM GOAL #1   Title neutral innominate rotation    Status Achieved               PT Long Term Goals - 11/10/20 1231       PT LONG TERM GOAL #1   Title pt will be on path to use of exercise for weight loss    Baseline doing exercise but no weight loss at this point    Status On-going    Target Date 12/12/20      PT LONG TERM GOAL #2   Title able to sleep without "drawing up" of hamstrings    Baseline still noticing RLS at night    Status On-going    Target Date 12/12/20      PT LONG TERM GOAL #3   Title pt will demo gross LE strength 5/5    Baseline see flowsheet    Status On-going    Target Date 12/12/20      PT LONG TERM GOAL #4   Title pt will verbalize feeling balanced when walking    Baseline I still feel off balance when I am walking arond, maybe from vertigo per pt as it is mosly after she has gotten sweaty.    Status New    Target Date 12/12/20                   Plan - 11/12/20 2042  Clinical Impression Statement Pain with heel strike reduced following manual therapy to gastroc. Significant edema noted just distal to knee joint line but no other symptoms to  indicate DVT- discussed what to watch for.    PT Treatment/Interventions ADLs/Self Care Home Management;Moist Heat;Iontophoresis 4mg /ml Dexamethasone;Electrical Stimulation;Cryotherapy;Traction;Therapeutic activities;Functional mobility training;Stair training;Gait training;Therapeutic exercise;Balance training;Neuromuscular re-education;Patient/family education;Manual techniques;Taping;Dry needling;Passive range of motion    PT Next Visit Plan check knee edema    PT Home Exercise Plan [id number], increase 3d/week by 10 min walk, every 2 weeks incr 10 min  [id number] (walking stretches)    Consulted and Agree with Plan of Care Patient             Patient will benefit from skilled therapeutic intervention in order to improve the following deficits and impairments:  Difficulty walking, Increased muscle spasms, Improper body mechanics, Decreased activity tolerance, Impaired flexibility, Postural dysfunction, Pain, Decreased strength  Visit Diagnosis: Chronic pain of left knee  Chronic pain of right knee     Problem List Patient Active Problem List   Diagnosis Date Noted   LUQ pain 09/15/2020   Calculus of gallbladder without cholecystitis without obstruction 09/15/2020   Hamstring tendinitis 09/10/2020   Pyrosis 07/21/2020   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   Right hip pain 06/12/2020   Pre-diabetes 03/12/2020   Seasonal allergies 03/12/2020   Obesity 10/30/2019   Low back pain 09/11/2019   Degenerative joint disease of knee, left 09/11/2019   Pancreatic cyst 01/25/2019   Gastroesophageal reflux disease 01/25/2019   Esophageal dysphagia 01/25/2019   Unintentional weight loss 01/25/2019   IGT (impaired glucose tolerance) 01/17/2019   BPPV (benign paroxysmal positional vertigo) 10/23/2012   Vertigo 10/21/2012   UTI (lower urinary tract infection) 10/21/2012   Nausea & vomiting 10/21/2012   Postoperative anemia due to acute blood loss 09/14/2012   OA  (osteoarthritis) of knee 09/12/2012   Insomnia 08/20/2011   OBSTRUCTIVE SLEEP APNEA 04/23/2008   RESTLESS LEG SYNDROME 04/23/2008   FIBROMYALGIA 01/06/2007     C.  PT, DPT 11/12/20 8:45 PM   [facility] [address] [place], [place], [address] Phone: [phone]   Fax:  [phone]  Name: Cassandra [name] MRN: [id number] Date of Birth: 1946/05/05

## 2020-11-17 ENCOUNTER — Ambulatory Visit (HOSPITAL_BASED_OUTPATIENT_CLINIC_OR_DEPARTMENT_OTHER): Payer: PPO | Admitting: Physical Therapy

## 2020-11-18 DIAGNOSIS — J3089 Other allergic rhinitis: Secondary | ICD-10-CM | POA: Diagnosis not present

## 2020-11-18 DIAGNOSIS — J301 Allergic rhinitis due to pollen: Secondary | ICD-10-CM | POA: Diagnosis not present

## 2020-11-19 ENCOUNTER — Ambulatory Visit (HOSPITAL_BASED_OUTPATIENT_CLINIC_OR_DEPARTMENT_OTHER): Payer: PPO | Admitting: Physical Therapy

## 2020-11-19 ENCOUNTER — Other Ambulatory Visit: Payer: Self-pay

## 2020-11-19 ENCOUNTER — Encounter (HOSPITAL_BASED_OUTPATIENT_CLINIC_OR_DEPARTMENT_OTHER): Payer: Self-pay | Admitting: Physical Therapy

## 2020-11-19 DIAGNOSIS — M25562 Pain in left knee: Secondary | ICD-10-CM

## 2020-11-19 DIAGNOSIS — G8929 Other chronic pain: Secondary | ICD-10-CM

## 2020-11-19 NOTE — Therapy (Signed)
[facility] [address] [place], [place], [address] Phone: [phone]   Fax:  [phone]  Physical Therapy Treatment  Patient Details  Name: Cassandra [name] MRN: [id number] Date of Birth: March 02, 1946 Referring Provider (PT): Charlann Boxer, DO   Encounter Date: 11/19/2020   PT End of Session - 11/19/20 1949     Visit Number 12    Number of Visits 21    Date for PT Re-Evaluation 12/12/20    Authorization Type [employer]    PT Start Time 1515    PT Stop Time 1555    PT Time Calculation (min) 40 min    Activity Tolerance Patient tolerated treatment well    Behavior During Therapy WFL for tasks assessed/performed             Past Medical History:  Diagnosis Date   Anemia    Anxiety    Asthma    Back pain    BPPV (benign paroxysmal positional vertigo)    Chronic allergic conjunctivitis    Depression    Diverticulosis    Fibromyalgia    Gallbladder problem    Gallstones    GERD (gastroesophageal reflux disease)    Hiatal hernia    History of bladder stone    Iron (Fe) deficiency anemia    Joint pain    Lump in female breast    Mild persistent asthma    followed by pcp--- dr Alfonso Patten. sharma ([place] allergy/asthma)   OA (osteoarthritis)    knees, hands   OAB (overactive bladder)    OSA (obstructive sleep apnea)    per pt has not used cpap several years   Pancreatic cyst    PONV (postoperative nausea and vomiting)    RLS (restless legs syndrome)    Seasonal and perennial allergic rhinitis    SUI (stress urinary incontinence, female)    Swallowing difficulty    Vitamin B12 deficiency    Vitamin D deficiency    Wears glasses     Past Surgical History:  Procedure Laterality Date   BLADDER SUSPENSION  1990's   sling   bladder tacking  2010   with mesh   BREAST LUMPECTOMY Left 1985   Benign cyst   BREAST REDUCTION SURGERY Bilateral 2003 approx.   CORNEA LESION EXCISION Right 2005  approx.    CYSTOSCOPY N/A 07/12/2016   Procedure: CYSTOSCOPY, VAGINOSCOPY, EXAM UNDER ANESTHESIA, STONE LITHOTRIPSY,;  Surgeon: Cleon Gustin, MD;  Location: [facility];  Service: Urology;  Laterality: N/A;   CYSTOSCOPY N/A 10/11/2016   Procedure: CYSTOSCOPY;  Surgeon: Cleon Gustin, MD;  Location: [facility];  Service: Urology;  Laterality: N/A;   CYSTOSCOPY WITH LITHOLAPAXY N/A 10/11/2016   Procedure: CYSTOSCOPY WITH LITHOLAPAXY/ EXCISION OF MESH;  Surgeon: Cleon Gustin, MD;  Location: [facility];  Service: Urology;  Laterality: N/A;   CYSTOSCOPY WITH LITHOLAPAXY N/A 01/22/2019   Procedure: CYSTOSCOPY WITH LITHOLAPAXY;  Surgeon: Cleon Gustin, MD;  Location: [facility];  Service: Urology;  Laterality: N/A;  1 HR   HOLMIUM LASER APPLICATION  01/29/5175   Procedure: HOLMIUM LASER APPLICATION;  Surgeon: Cleon Gustin, MD;  [facility];  Service: Urology;;   HOLMIUM LASER APPLICATION N/A 1/60/7371   Procedure: HOLMIUM LASER APPLICATION;  Surgeon: Cleon Gustin, MD;  Location: [facility];  Service: Urology;  Laterality: N/A;   RIGHT KNEE ARTHROSCOPY  2013   TOTAL KNEE ARTHROPLASTY Right  09/12/2012   Procedure: RIGHT TOTAL KNEE ARTHROPLASTY;  Surgeon: Gearlean Alf, MD;  Location: [facility];  Service: Orthopedics;  Laterality: Right;   VAGINAL HYSTERECTOMY  1984    There were no vitals filed for this visit.   Subjective Assessment - 11/19/20 1518     Subjective Lt knee is clicking, Lt lateral foot hurts, Rt heel hurts, back feels ok. I have been walking my mile.    Patient Stated Goals lose weight                               [facility] Adult PT Treatment/Exercise - 11/19/20 0001       Knee/Hip Exercises: Stretches   Gastroc Stretch 2 reps;Both;30 seconds    Gastroc Stretch Limitations slant board    Other Knee/Hip Stretches Hesh self correction for Lt  pos innom      Knee/Hip Exercises: Supine   Other Supine Knee/Hip Exercises MET- Lt hip flexors, Rt extensors      Manual Therapy   Soft tissue mobilization IASTM Rt plantar fascia                    PT Education - 11/19/20 1956     Education Details heel lift, tread wear, plantar fasciitis    Person(s) Educated Patient    Methods Explanation    Comprehension Verbalized understanding;Need further instruction              PT Short Term Goals - 11/10/20 1231       PT SHORT TERM GOAL #1   Title neutral innominate rotation    Status Achieved               PT Long Term Goals - 11/10/20 1231       PT LONG TERM GOAL #1   Title pt will be on path to use of exercise for weight loss    Baseline doing exercise but no weight loss at this point    Status On-going    Target Date 12/12/20      PT LONG TERM GOAL #2   Title able to sleep without "drawing up" of hamstrings    Baseline still noticing RLS at night    Status On-going    Target Date 12/12/20      PT LONG TERM GOAL #3   Title pt will demo gross LE strength 5/5    Baseline see flowsheet    Status On-going    Target Date 12/12/20      PT LONG TERM GOAL #4   Title pt will verbalize feeling balanced when walking    Baseline I still feel off balance when I am walking arond, maybe from vertigo per pt as it is mosly after she has gotten sweaty.    Status New    Target Date 12/12/20                   Plan - 11/19/20 1545     Clinical Impression Statement reduced heel lift to 2 layers, MET to correct pelvic alignment and IASTM to decr Rt plantar fascia pain. Pt demo improved step width but cont to perform lateral to medial heel strike that is evident in wear of the tread of her shoes. Medial Rt knee grinds throughout ROM and is uncomfortable.    PT Treatment/Interventions ADLs/Self Care Home Management;Moist Heat;Iontophoresis 27m/ml Dexamethasone;Electrical  Stimulation;Cryotherapy;Traction;Therapeutic activities;Functional mobility training;Stair training;Gait training;Therapeutic exercise;Balance training;Neuromuscular  re-education;Patient/family education;Manual techniques;Taping;Dry needling;Passive range of motion    PT Next Visit Plan outcome of new heel lift height.    PT Home Exercise Plan [id number], increase 3d/week by 10 min walk, every 2 weeks incr 10 min  [id number] (walking stretches)             Patient will benefit from skilled therapeutic intervention in order to improve the following deficits and impairments:  Difficulty walking, Increased muscle spasms, Improper body mechanics, Decreased activity tolerance, Impaired flexibility, Postural dysfunction, Pain, Decreased strength  Visit Diagnosis: Chronic pain of right knee  Chronic pain of left knee     Problem List Patient Active Problem List   Diagnosis Date Noted   LUQ pain 09/15/2020   Calculus of gallbladder without cholecystitis without obstruction 09/15/2020   Hamstring tendinitis 09/10/2020   Pyrosis 07/21/2020   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   Right hip pain 06/12/2020   Pre-diabetes 03/12/2020   Seasonal allergies 03/12/2020   Obesity 10/30/2019   Low back pain 09/11/2019   Degenerative joint disease of knee, left 09/11/2019   Pancreatic cyst 01/25/2019   Gastroesophageal reflux disease 01/25/2019   Esophageal dysphagia 01/25/2019   Unintentional weight loss 01/25/2019   IGT (impaired glucose tolerance) 01/17/2019   BPPV (benign paroxysmal positional vertigo) 10/23/2012   Vertigo 10/21/2012   UTI (lower urinary tract infection) 10/21/2012   Nausea & vomiting 10/21/2012   Postoperative anemia due to acute blood loss 09/14/2012   OA (osteoarthritis) of knee 09/12/2012   Insomnia 08/20/2011   OBSTRUCTIVE SLEEP APNEA 04/23/2008   RESTLESS LEG SYNDROME 04/23/2008   FIBROMYALGIA 01/06/2007    C.  PT,  DPT 11/19/20 8:00 PM   [facility] [address] [place], [place], [address] Phone: [phone]   Fax:  [phone]  Name: Cassandra [name] MRN: [id number] Date of Birth: 1946-05-20

## 2020-11-21 ENCOUNTER — Ambulatory Visit (HOSPITAL_BASED_OUTPATIENT_CLINIC_OR_DEPARTMENT_OTHER): Payer: PPO | Admitting: Physical Therapy

## 2020-11-21 ENCOUNTER — Other Ambulatory Visit: Payer: Self-pay

## 2020-11-21 ENCOUNTER — Encounter (HOSPITAL_BASED_OUTPATIENT_CLINIC_OR_DEPARTMENT_OTHER): Payer: Self-pay | Admitting: Physical Therapy

## 2020-11-21 DIAGNOSIS — M25562 Pain in left knee: Secondary | ICD-10-CM | POA: Diagnosis not present

## 2020-11-21 DIAGNOSIS — M25561 Pain in right knee: Secondary | ICD-10-CM

## 2020-11-21 DIAGNOSIS — G8929 Other chronic pain: Secondary | ICD-10-CM

## 2020-11-21 NOTE — Therapy (Signed)
[facility] [address] [place], [place], [address] Phone: [phone]   Fax:  [phone]  Physical Therapy Treatment  Patient Details  Name: Cassandra [name] MRN: [id number] Date of Birth: January 30, 1946 Referring Provider (PT): Charlann Boxer, DO   Encounter Date: 11/21/2020   PT End of Session - 11/21/20 1305     Visit Number 13    Number of Visits 21    Date for PT Re-Evaluation 12/12/20    Authorization Type [employer]    PT Start Time 1301    PT Stop Time 1340    PT Time Calculation (min) 39 min    Activity Tolerance Patient tolerated treatment well    Behavior During Therapy [facility] for tasks assessed/performed             Past Medical History:  Diagnosis Date   Anemia    Anxiety    Asthma    Back pain    BPPV (benign paroxysmal positional vertigo)    Chronic allergic conjunctivitis    Depression    Diverticulosis    Fibromyalgia    Gallbladder problem    Gallstones    GERD (gastroesophageal reflux disease)    Hiatal hernia    History of bladder stone    Iron (Fe) deficiency anemia    Joint pain    Lump in female breast    Mild persistent asthma    followed by pcp--- dr Alfonso Patten. sharma ([place] allergy/asthma)   OA (osteoarthritis)    knees, hands   OAB (overactive bladder)    OSA (obstructive sleep apnea)    per pt has not used cpap several years   Pancreatic cyst    PONV (postoperative nausea and vomiting)    RLS (restless legs syndrome)    Seasonal and perennial allergic rhinitis    SUI (stress urinary incontinence, female)    Swallowing difficulty    Vitamin B12 deficiency    Vitamin D deficiency    Wears glasses     Past Surgical History:  Procedure Laterality Date   BLADDER SUSPENSION  1990's   sling   bladder tacking  2010   with mesh   BREAST LUMPECTOMY Left 1985   Benign cyst   BREAST REDUCTION SURGERY Bilateral 2003 approx.   CORNEA LESION EXCISION Right 2005  approx.    CYSTOSCOPY N/A 07/12/2016   Procedure: CYSTOSCOPY, VAGINOSCOPY, EXAM UNDER ANESTHESIA, STONE LITHOTRIPSY,;  Surgeon: Cleon Gustin, MD;  Location[facility];  Service: Urology;  Laterality: N/A;   CYSTOSCOPY N/A 10/11/2016   Procedure: CYSTOSCOPY;  Surgeon: Cleon Gustin, MD;  Location: [facility];  Service: Urology;  Laterality: N/A;   CYSTOSCOPY WITH LITHOLAPAXY N/A 10/11/2016   Procedure: CYSTOSCOPY WITH LITHOLAPAXY/ EXCISION OF MESH;  Surgeon: Cleon Gustin, MD;  Location: [facility];  Service: Urology;  Laterality: N/A;   CYSTOSCOPY WITH LITHOLAPAXY N/A 01/22/2019   Procedure: CYSTOSCOPY WITH LITHOLAPAXY;  Surgeon: Cleon Gustin, MD;  Location: [facility];  Service: Urology;  Laterality: N/A;  1 HR   HOLMIUM LASER APPLICATION  07/02/5425   Procedure: HOLMIUM LASER APPLICATION;  Surgeon: Cleon Gustin, MD;  Location: [facility];  Service: Urology;;   HOLMIUM LASER APPLICATION N/A 0/62/3762   Procedure: HOLMIUM LASER APPLICATION;  Surgeon: Cleon Gustin, MD;  Location: [facility];  Service: Urology;  Laterality: N/A;   RIGHT KNEE ARTHROSCOPY  2013   TOTAL KNEE ARTHROPLASTY Right  09/12/2012   Procedure: RIGHT TOTAL KNEE ARTHROPLASTY;  Surgeon: Gearlean Alf, MD;  Location[facility];  Service: Orthopedics;  Laterality: Right;   VAGINAL HYSTERECTOMY  1984    There were no vitals filed for this visit.   Subjective Assessment - 11/21/20 1303     Subjective Lt knee is still a little painful. I was able to get my mile in today. Got my HR up to 122 and it stayed up.    Currently in Pain? No/denies                               [facility] Adult PT Treatment/Exercise - 11/21/20 0001       Neuro Re-ed    Neuro Re-ed Details  wide & narrow tandem with head turns, all on floor & airex      Knee/Hip Exercises: Standing   Heel Raises Limitations cues for  medial weight placement and balance without UE assist    Other Standing Knee Exercises yellow band around forefeet, small steps keeping feet neutral    Other Standing Knee Exercises counter plank with hip ext- added opp UE      Knee/Hip Exercises: Seated   Long Arc Quad Limitations wth ball bw your knees                      PT Short Term Goals - 11/10/20 1231       PT SHORT TERM GOAL #1   Title neutral innominate rotation    Status Achieved               PT Long Term Goals - 11/10/20 1231       PT LONG TERM GOAL #1   Title pt will be on path to use of exercise for weight loss    Baseline doing exercise but no weight loss at this point    Status On-going    Target Date 12/12/20      PT LONG TERM GOAL #2   Title able to sleep without "drawing up" of hamstrings    Baseline still noticing RLS at night    Status On-going    Target Date 12/12/20      PT LONG TERM GOAL #3   Title pt will demo gross LE strength 5/5    Baseline see flowsheet    Status On-going    Target Date 12/12/20      PT LONG TERM GOAL #4   Title pt will verbalize feeling balanced when walking    Baseline I still feel off balance when I am walking arond, maybe from vertigo per pt as it is mosly after she has gotten sweaty.    Status New    Target Date 12/12/20                   Plan - 11/21/20 1352     Clinical Impression Statement doing well with 2-layers of heel lift. worked on Conservation officer, nature with neuro challenge in head turns to maint balance. Added side stepping with resist at feet to strengthen peroneals and decrease lateral foot strike.    PT Treatment/Interventions ADLs/Self Care Home Management;Moist Heat;Iontophoresis 4mg /ml Dexamethasone;Electrical Stimulation;Cryotherapy;Traction;Therapeutic activities;Functional mobility training;Stair training;Gait training;Therapeutic exercise;Balance training;Neuromuscular re-education;Patient/family education;Manual  techniques;Taping;Dry needling;Passive range of motion    PT Next Visit Plan progress core strength, walk on track- look for gait changes with longer duration    PT Home Exercise  Plan [id number], increase 3d/week by 10 min walk, every 2 weeks incr 10 min  [id number] (walking stretches)    Consulted and Agree with Plan of Care Patient             Patient will benefit from skilled therapeutic intervention in order to improve the following deficits and impairments:  Difficulty walking, Increased muscle spasms, Improper body mechanics, Decreased activity tolerance, Impaired flexibility, Postural dysfunction, Pain, Decreased strength  Visit Diagnosis: Chronic pain of right knee  Chronic pain of left knee     Problem List Patient Active Problem List   Diagnosis Date Noted   LUQ pain 09/15/2020   Calculus of gallbladder without cholecystitis without obstruction 09/15/2020   Hamstring tendinitis 09/10/2020   Pyrosis 07/21/2020   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   Right hip pain 06/12/2020   Pre-diabetes 03/12/2020   Seasonal allergies 03/12/2020   Obesity 10/30/2019   Low back pain 09/11/2019   Degenerative joint disease of knee, left 09/11/2019   Pancreatic cyst 01/25/2019   Gastroesophageal reflux disease 01/25/2019   Esophageal dysphagia 01/25/2019   Unintentional weight loss 01/25/2019   IGT (impaired glucose tolerance) 01/17/2019   BPPV (benign paroxysmal positional vertigo) 10/23/2012   Vertigo 10/21/2012   UTI (lower urinary tract infection) 10/21/2012   Nausea & vomiting 10/21/2012   Postoperative anemia due to acute blood loss 09/14/2012   OA (osteoarthritis) of knee 09/12/2012   Insomnia 08/20/2011   OBSTRUCTIVE SLEEP APNEA 04/23/2008   RESTLESS LEG SYNDROME 04/23/2008   FIBROMYALGIA 01/06/2007    C.  PT, DPT 11/21/20 1:55 PM   [facility] [address] [place], [place],  [address] Phone: [phone]   Fax:  [phone]  Name: Cassandra [name] MRN: [id number] Date of Birth: 08/04/1945

## 2020-11-24 ENCOUNTER — Ambulatory Visit (HOSPITAL_BASED_OUTPATIENT_CLINIC_OR_DEPARTMENT_OTHER): Payer: PPO | Admitting: Physical Therapy

## 2020-11-24 ENCOUNTER — Encounter (HOSPITAL_BASED_OUTPATIENT_CLINIC_OR_DEPARTMENT_OTHER): Payer: Self-pay | Admitting: Physical Therapy

## 2020-11-24 ENCOUNTER — Other Ambulatory Visit: Payer: Self-pay

## 2020-11-24 DIAGNOSIS — M25561 Pain in right knee: Secondary | ICD-10-CM

## 2020-11-24 DIAGNOSIS — G8929 Other chronic pain: Secondary | ICD-10-CM

## 2020-11-24 DIAGNOSIS — M25562 Pain in left knee: Secondary | ICD-10-CM | POA: Diagnosis not present

## 2020-11-24 NOTE — Therapy (Signed)
[facility] [address] [place], [place], [address] Phone: [phone]   Fax:  [phone]  Physical Therapy Treatment  Patient Details  Name: Cassandra [name] MRN: [id number] Date of Birth: [birth year] Referring Provider (PT): Charlann Boxer, DO   Encounter Date: 11/24/2020   PT End of Session - 11/24/20 1423     Visit Number 14    Number of Visits 21    Date for PT Re-Evaluation 12/12/20    Authorization Type [employer]    PT Start Time 1345    PT Stop Time 1424    PT Time Calculation (min) 39 min    Activity Tolerance Patient tolerated treatment well    Behavior During Therapy [facility] for tasks assessed/performed             Past Medical History:  Diagnosis Date   Anemia    Anxiety    Asthma    Back pain    BPPV (benign paroxysmal positional vertigo)    Chronic allergic conjunctivitis    Depression    Diverticulosis    Fibromyalgia    Gallbladder problem    Gallstones    GERD (gastroesophageal reflux disease)    Hiatal hernia    History of bladder stone    Iron (Fe) deficiency anemia    Joint pain    Lump in female breast    Mild persistent asthma    followed by pcp--- dr Alfonso Patten. sharma ([place] allergy/asthma)   OA (osteoarthritis)    knees, hands   OAB (overactive bladder)    OSA (obstructive sleep apnea)    per pt has not used cpap several years   Pancreatic cyst    PONV (postoperative nausea and vomiting)    RLS (restless legs syndrome)    Seasonal and perennial allergic rhinitis    SUI (stress urinary incontinence, female)    Swallowing difficulty    Vitamin B12 deficiency    Vitamin D deficiency    Wears glasses     Past Surgical History:  Procedure Laterality Date   BLADDER SUSPENSION  1990's   sling   bladder tacking  2010   with mesh   BREAST LUMPECTOMY Left 1985   Benign cyst   BREAST REDUCTION SURGERY Bilateral 2003 approx.   CORNEA LESION EXCISION Right 2005  approx.    CYSTOSCOPY N/A 07/12/2016   Procedure: CYSTOSCOPY, VAGINOSCOPY, EXAM UNDER ANESTHESIA, STONE LITHOTRIPSY,;  Surgeon: Cleon Gustin, MD;  Location[facility];  Service: Urology;  Laterality: N/A;   CYSTOSCOPY N/A 10/11/2016   Procedure: CYSTOSCOPY;  Surgeon: Cleon Gustin, MD;  Location: [facility];  Service: Urology;  Laterality: N/A;   CYSTOSCOPY WITH LITHOLAPAXY N/A 10/11/2016   Procedure: CYSTOSCOPY WITH LITHOLAPAXY/ EXCISION OF MESH;  Surgeon: Cleon Gustin, MD;  Location: [facility];  Service: Urology;  Laterality: N/A;   CYSTOSCOPY WITH LITHOLAPAXY N/A 01/22/2019   Procedure: CYSTOSCOPY WITH LITHOLAPAXY;  Surgeon: Cleon Gustin, MD;  Location: [facility];  Service: Urology;  Laterality: N/A;  1 HR   HOLMIUM LASER APPLICATION  0/24/0973   Procedure: HOLMIUM LASER APPLICATION;  Surgeon: Cleon Gustin, MD;  Location: [facility];  Service: Urology;;   HOLMIUM LASER APPLICATION N/A 5/32/9924   Procedure: HOLMIUM LASER APPLICATION;  Surgeon: Cleon Gustin, MD;  Location: [facility];  Service: Urology;  Laterality: N/A;   RIGHT KNEE ARTHROSCOPY  2013   TOTAL KNEE ARTHROPLASTY Right  09/12/2012   Procedure: RIGHT TOTAL KNEE ARTHROPLASTY;  Surgeon: Gearlean Alf, MD;  Location: [facility];  Service: Orthopedics;  Laterality: Right;   VAGINAL HYSTERECTOMY  1984    There were no vitals filed for this visit.   Subjective Assessment - 11/24/20 1347     Subjective My knee is feeling pretty good but my ankle is still bothering me. I walked faster than normal today.    Patient Stated Goals lose weight                               [facility] Adult PT Treatment/Exercise - 11/24/20 0001       Knee/Hip Exercises: Standing   Gait Training treatdmill & floor- reducing lateral to medial heel strike    Other Standing Knee Exercises fwd steps and side steps onto  airex      Manual Therapy   Soft tissue mobilization IASTM Lt peroneals                      PT Short Term Goals - 11/10/20 1231       PT SHORT TERM GOAL #1   Title neutral innominate rotation    Status Achieved               PT Long Term Goals - 11/10/20 1231       PT LONG TERM GOAL #1   Title pt will be on path to use of exercise for weight loss    Baseline doing exercise but no weight loss at this point    Status On-going    Target Date 12/12/20      PT LONG TERM GOAL #2   Title able to sleep without "drawing up" of hamstrings    Baseline still noticing RLS at night    Status On-going    Target Date 12/12/20      PT LONG TERM GOAL #3   Title pt will demo gross LE strength 5/5    Baseline see flowsheet    Status On-going    Target Date 12/12/20      PT LONG TERM GOAL #4   Title pt will verbalize feeling balanced when walking    Baseline I still feel off balance when I am walking arond, maybe from vertigo per pt as it is mosly after she has gotten sweaty.    Status New    Target Date 12/12/20                   Plan - 11/24/20 1427     Clinical Impression Statement Reduced peroneal pain and tightness following IASTM but still demo poor ankle stability in walking. placed pt on treadmil and provided tactile cues to guide foot into proper positioning. will cont to benefit from focus on ankle stability to decrease irritation to ankle. knee and hip.    PT Treatment/Interventions ADLs/Self Care Home Management;Moist Heat;Iontophoresis 4mg /ml Dexamethasone;Electrical Stimulation;Cryotherapy;Traction;Therapeutic activities;Functional mobility training;Stair training;Gait training;Therapeutic exercise;Balance training;Neuromuscular re-education;Patient/family education;Manual techniques;Taping;Dry needling;Passive range of motion    PT Next Visit Plan re-evaluate gait and ankle- cont to work on stability, how was visit with nutritionist?    PT Home  Exercise Plan [id number], increase 3d/week by 10 min walk, every 2 weeks incr 10 min  [id number] (walking stretches)    Consulted and Agree with Plan of Care Patient             Patient will  benefit from skilled therapeutic intervention in order to improve the following deficits and impairments:  Difficulty walking, Increased muscle spasms, Improper body mechanics, Decreased activity tolerance, Impaired flexibility, Postural dysfunction, Pain, Decreased strength  Visit Diagnosis: Chronic pain of right knee  Chronic pain of left knee     Problem List Patient Active Problem List   Diagnosis Date Noted   LUQ pain 09/15/2020   Calculus of gallbladder without cholecystitis without obstruction 09/15/2020   Hamstring tendinitis 09/10/2020   Pyrosis 07/21/2020   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   Right hip pain 06/12/2020   Pre-diabetes 03/12/2020   Seasonal allergies 03/12/2020   Obesity 10/30/2019   Low back pain 09/11/2019   Degenerative joint disease of knee, left 09/11/2019   Pancreatic cyst 01/25/2019   Gastroesophageal reflux disease 01/25/2019   Esophageal dysphagia 01/25/2019   Unintentional weight loss 01/25/2019   IGT (impaired glucose tolerance) 01/17/2019   BPPV (benign paroxysmal positional vertigo) 10/23/2012   Vertigo 10/21/2012   UTI (lower urinary tract infection) 10/21/2012   Nausea & vomiting 10/21/2012   Postoperative anemia due to acute blood loss 09/14/2012   OA (osteoarthritis) of knee 09/12/2012   Insomnia 08/20/2011   OBSTRUCTIVE SLEEP APNEA 04/23/2008   RESTLESS LEG SYNDROME 04/23/2008   FIBROMYALGIA 01/06/2007     C.  PT, DPT 11/24/20 2:29 PM   [facility] [address] [place], [place], [address] Phone: [phone]   Fax:  [phone]  Name: ARDENIA STINER MRN: [id number] Date of Birth: Mar 02, 1946

## 2020-11-26 ENCOUNTER — Ambulatory Visit (INDEPENDENT_AMBULATORY_CARE_PROVIDER_SITE_OTHER): Payer: PPO | Admitting: Family Medicine

## 2020-11-26 ENCOUNTER — Other Ambulatory Visit: Payer: Self-pay

## 2020-11-26 ENCOUNTER — Encounter (INDEPENDENT_AMBULATORY_CARE_PROVIDER_SITE_OTHER): Payer: Self-pay | Admitting: Family Medicine

## 2020-11-26 VITALS — BP 102/65 | HR 68 | Temp 98.1°F | Ht 65.0 in | Wt 201.0 lb

## 2020-11-26 DIAGNOSIS — E785 Hyperlipidemia, unspecified: Secondary | ICD-10-CM

## 2020-11-26 DIAGNOSIS — R0602 Shortness of breath: Secondary | ICD-10-CM

## 2020-11-26 DIAGNOSIS — R7303 Prediabetes: Secondary | ICD-10-CM

## 2020-11-26 DIAGNOSIS — J3089 Other allergic rhinitis: Secondary | ICD-10-CM | POA: Diagnosis not present

## 2020-11-26 DIAGNOSIS — E559 Vitamin D deficiency, unspecified: Secondary | ICD-10-CM | POA: Diagnosis not present

## 2020-11-26 DIAGNOSIS — Z6832 Body mass index (BMI) 32.0-32.9, adult: Secondary | ICD-10-CM | POA: Diagnosis not present

## 2020-11-26 DIAGNOSIS — E669 Obesity, unspecified: Secondary | ICD-10-CM | POA: Diagnosis not present

## 2020-11-26 DIAGNOSIS — E538 Deficiency of other specified B group vitamins: Secondary | ICD-10-CM | POA: Diagnosis not present

## 2020-11-26 DIAGNOSIS — G4733 Obstructive sleep apnea (adult) (pediatric): Secondary | ICD-10-CM | POA: Diagnosis not present

## 2020-11-26 DIAGNOSIS — E66811 Obesity, class 1: Secondary | ICD-10-CM

## 2020-11-26 DIAGNOSIS — J301 Allergic rhinitis due to pollen: Secondary | ICD-10-CM | POA: Diagnosis not present

## 2020-11-26 DIAGNOSIS — K862 Cyst of pancreas: Secondary | ICD-10-CM | POA: Diagnosis not present

## 2020-11-26 DIAGNOSIS — D508 Other iron deficiency anemias: Secondary | ICD-10-CM

## 2020-11-27 ENCOUNTER — Other Ambulatory Visit: Payer: Self-pay | Admitting: Family Medicine

## 2020-11-27 ENCOUNTER — Other Ambulatory Visit: Payer: Self-pay | Admitting: Internal Medicine

## 2020-11-27 DIAGNOSIS — S0502XA Injury of conjunctiva and corneal abrasion without foreign body, left eye, initial encounter: Secondary | ICD-10-CM | POA: Diagnosis not present

## 2020-11-27 LAB — IRON,TIBC AND FERRITIN PANEL
Ferritin: 20 ng/mL (ref 15–150)
Iron Saturation: 11 % — ABNORMAL LOW (ref 15–55)
Iron: 48 ug/dL (ref 27–139)
Total Iron Binding Capacity: 448 ug/dL (ref 250–450)
UIBC: 400 ug/dL — ABNORMAL HIGH (ref 118–369)

## 2020-11-27 LAB — COMPREHENSIVE METABOLIC PANEL
ALT: 8 IU/L (ref 0–32)
AST: 19 IU/L (ref 0–40)
Albumin/Globulin Ratio: 2.1 (ref 1.2–2.2)
Albumin: 4.9 g/dL — ABNORMAL HIGH (ref 3.7–4.7)
Alkaline Phosphatase: 100 IU/L (ref 44–121)
BUN/Creatinine Ratio: 14 (ref 12–28)
BUN: 11 mg/dL (ref 8–27)
Bilirubin Total: 0.4 mg/dL (ref 0.0–1.2)
CO2: 24 mmol/L (ref 20–29)
Calcium: 9.9 mg/dL (ref 8.7–10.3)
Chloride: 100 mmol/L (ref 96–106)
Creatinine, Ser: 0.76 mg/dL (ref 0.57–1.00)
Globulin, Total: 2.3 g/dL (ref 1.5–4.5)
Glucose: 88 mg/dL (ref 65–99)
Potassium: 4.5 mmol/L (ref 3.5–5.2)
Sodium: 140 mmol/L (ref 134–144)
Total Protein: 7.2 g/dL (ref 6.0–8.5)
eGFR: 83 mL/min/{1.73_m2} (ref >59)

## 2020-11-27 LAB — LIPID PANEL
Chol/HDL Ratio: 3 ratio (ref 0.0–4.4)
Cholesterol, Total: 173 mg/dL (ref 100–199)
HDL: 57 mg/dL (ref >39)
LDL Chol Calc (NIH): 98 mg/dL (ref 0–99)
Triglycerides: 97 mg/dL (ref 0–149)
VLDL Cholesterol Cal: 18 mg/dL (ref 5–40)

## 2020-11-27 LAB — CBC WITH DIFFERENTIAL/PLATELET
Basophils Absolute: 0 10*3/uL (ref 0.0–0.2)
Basos: 1 %
EOS (ABSOLUTE): 0.1 10*3/uL (ref 0.0–0.4)
Eos: 2 %
Hematocrit: 42.4 % (ref 34.0–46.6)
Hemoglobin: 13.5 g/dL (ref 11.1–15.9)
Immature Grans (Abs): 0 10*3/uL (ref 0.0–0.1)
Immature Granulocytes: 0 %
Lymphocytes Absolute: 2.1 10*3/uL (ref 0.7–3.1)
Lymphs: 32 %
MCH: 26.2 pg — ABNORMAL LOW (ref 26.6–33.0)
MCHC: 31.8 g/dL (ref 31.5–35.7)
MCV: 82 fL (ref 79–97)
Monocytes Absolute: 0.6 10*3/uL (ref 0.1–0.9)
Monocytes: 9 %
Neutrophils Absolute: 3.8 10*3/uL (ref 1.4–7.0)
Neutrophils: 56 %
Platelets: 266 10*3/uL (ref 150–450)
RBC: 5.15 x10E6/uL (ref 3.77–5.28)
RDW: 13.1 % (ref 11.7–15.4)
WBC: 6.7 10*3/uL (ref 3.4–10.8)

## 2020-11-27 LAB — VITAMIN B12: Vitamin B-12: 507 pg/mL (ref 232–1245)

## 2020-11-27 LAB — T4, FREE: Free T4: 0.97 ng/dL (ref 0.82–1.77)

## 2020-11-27 LAB — TSH: TSH: 1.59 u[IU]/mL (ref 0.450–4.500)

## 2020-11-27 LAB — VITAMIN D 25 HYDROXY (VIT D DEFICIENCY, FRACTURES): Vit D, 25-Hydroxy: 45.1 ng/mL (ref 30.0–100.0)

## 2020-11-27 LAB — INSULIN, RANDOM: INSULIN: 19.1 u[IU]/mL (ref 2.6–24.9)

## 2020-11-28 ENCOUNTER — Ambulatory Visit (HOSPITAL_BASED_OUTPATIENT_CLINIC_OR_DEPARTMENT_OTHER): Payer: PPO | Attending: Family Medicine | Admitting: Physical Therapy

## 2020-11-28 ENCOUNTER — Encounter (HOSPITAL_BASED_OUTPATIENT_CLINIC_OR_DEPARTMENT_OTHER): Payer: Self-pay | Admitting: Physical Therapy

## 2020-11-28 ENCOUNTER — Other Ambulatory Visit: Payer: Self-pay

## 2020-11-28 DIAGNOSIS — M25561 Pain in right knee: Secondary | ICD-10-CM | POA: Insufficient documentation

## 2020-11-28 DIAGNOSIS — G8929 Other chronic pain: Secondary | ICD-10-CM | POA: Insufficient documentation

## 2020-11-28 DIAGNOSIS — M25562 Pain in left knee: Secondary | ICD-10-CM | POA: Insufficient documentation

## 2020-11-28 DIAGNOSIS — J3089 Other allergic rhinitis: Secondary | ICD-10-CM | POA: Diagnosis not present

## 2020-11-28 DIAGNOSIS — J301 Allergic rhinitis due to pollen: Secondary | ICD-10-CM | POA: Diagnosis not present

## 2020-11-28 NOTE — Therapy (Signed)
[facility] [address] [place], [place], [address] Phone: [phone]   Fax:  [phone]  Physical Therapy Treatment  Patient Details  Name: Cassandra [name] MRN: [id number] Date of Birth: 1946/02/14 Referring Provider (PT): Charlann Boxer, DO   Encounter Date: 11/28/2020   PT End of Session - 11/28/20 1405     Visit Number 15    Number of Visits 21    Date for PT Re-Evaluation 12/12/20    Authorization Type [employer]    PT Start Time 1346    PT Stop Time 1427    PT Time Calculation (min) 41 min    Activity Tolerance Patient tolerated treatment well    Behavior During Therapy [facility] for tasks assessed/performed             Past Medical History:  Diagnosis Date   Anemia    Anxiety    Asthma    Back pain    BPPV (benign paroxysmal positional vertigo)    Chronic allergic conjunctivitis    Depression    Diverticulosis    Fibromyalgia    Gallbladder problem    Gallstones    GERD (gastroesophageal reflux disease)    Hiatal hernia    History of bladder stone    Iron (Fe) deficiency anemia    Joint pain    Lump in female breast    Mild persistent asthma    followed by pcp--- dr Alfonso Patten. sharma ([place] allergy/asthma)   OA (osteoarthritis)    knees, hands   OAB (overactive bladder)    OSA (obstructive sleep apnea)    per pt has not used cpap several years   Pancreatic cyst    PONV (postoperative nausea and vomiting)    RLS (restless legs syndrome)    Seasonal and perennial allergic rhinitis    SUI (stress urinary incontinence, female)    Swallowing difficulty    Vitamin B12 deficiency    Vitamin D deficiency    Wears glasses     Past Surgical History:  Procedure Laterality Date   BLADDER SUSPENSION  1990's   sling   bladder tacking  2010   with mesh   BREAST LUMPECTOMY Left 1985   Benign cyst   BREAST REDUCTION SURGERY Bilateral 2003 approx.   CORNEA LESION EXCISION Right 2005  approx.    CYSTOSCOPY N/A 07/12/2016   Procedure: CYSTOSCOPY, VAGINOSCOPY, EXAM UNDER ANESTHESIA, STONE LITHOTRIPSY,;  Surgeon: Cleon Gustin, MD;  Location: [facility];  Service: Urology;  Laterality: N/A;   CYSTOSCOPY N/A 10/11/2016   Procedure: CYSTOSCOPY;  Surgeon: Cleon Gustin, MD;  Location: [facility];  Service: Urology;  Laterality: N/A;   CYSTOSCOPY WITH LITHOLAPAXY N/A 10/11/2016   Procedure: CYSTOSCOPY WITH LITHOLAPAXY/ EXCISION OF MESH;  Surgeon: Cleon Gustin, MD;  Location: [facility];  Service: Urology;  Laterality: N/A;   CYSTOSCOPY WITH LITHOLAPAXY N/A 01/22/2019   Procedure: CYSTOSCOPY WITH LITHOLAPAXY;  Surgeon: Cleon Gustin, MD;  Location: [facility];  Service: Urology;  Laterality: N/A;  1 HR   HOLMIUM LASER APPLICATION  9/79/8921   Procedure: HOLMIUM LASER APPLICATION;  Surgeon: Cleon Gustin, MD;  Location: [facility];  Service: Urology;;   HOLMIUM LASER APPLICATION N/A 1/94/1740   Procedure: HOLMIUM LASER APPLICATION;  Surgeon: Cleon Gustin, MD;  Location: [facility];  Service: Urology;  Laterality: N/A;   RIGHT KNEE ARTHROSCOPY  2013   TOTAL KNEE ARTHROPLASTY Right  09/12/2012   Procedure: RIGHT TOTAL KNEE ARTHROPLASTY;  Surgeon: Gearlean Alf, MD;  Location: [facility];  Service: Orthopedics;  Laterality: Right;   VAGINAL HYSTERECTOMY  1984    There were no vitals filed for this visit.   Subjective Assessment - 11/28/20 1354     Subjective I felt it go out when I stood up from the toilet. my eye is really bad but unsure of what is wrong with it.    Currently in Pain? No/denies                [facility] PT Assessment - 11/28/20 0001       High Level Balance   High Level Balance Comments Lt 5s, Rt 10+                           [facility] Adult PT Treatment/Exercise - 11/28/20 0001       Knee/Hip Exercises: Standing   Heel Raises  Limitations feet turned out    Functional Squat Limitations on airex- reach tailbone back to tap bar behind    Other Standing Knee Exercises golfers lift    Other Standing Knee Exercises retro stepping toe-heel                      PT Short Term Goals - 11/10/20 1231       PT SHORT TERM GOAL #1   Title neutral innominate rotation    Status Achieved               PT Long Term Goals - 11/10/20 1231       PT LONG TERM GOAL #1   Title pt will be on path to use of exercise for weight loss    Baseline doing exercise but no weight loss at this point    Status On-going    Target Date 12/12/20      PT LONG TERM GOAL #2   Title able to sleep without "drawing up" of hamstrings    Baseline still noticing RLS at night    Status On-going    Target Date 12/12/20      PT LONG TERM GOAL #3   Title pt will demo gross LE strength 5/5    Baseline see flowsheet    Status On-going    Target Date 12/12/20      PT LONG TERM GOAL #4   Title pt will verbalize feeling balanced when walking    Baseline I still feel off balance when I am walking arond, maybe from vertigo per pt as it is mosly after she has gotten sweaty.    Status New    Target Date 12/12/20                   Plan - 11/28/20 1625     Clinical Impression Statement pelvis was rotated upon arrival today but she was able to correct herself with MET- she had not thought to do this at home so it was reprinted. Progressed HEP to include CKC hip abd activation as well. Poor balance on LT LE and pain in knee when not assisted by UEs.    PT Treatment/Interventions ADLs/Self Care Home Management;Moist Heat;Iontophoresis 29m/ml Dexamethasone;Electrical Stimulation;Cryotherapy;Traction;Therapeutic activities;Functional mobility training;Stair training;Gait training;Therapeutic exercise;Balance training;Neuromuscular re-education;Patient/family education;Manual techniques;Taping;Dry needling;Passive range of motion     PT Next Visit Plan cont CKC    PT Home Exercise Plan [id number], increase 3d/week by 10 min walk,  every 2 weeks incr 10 min  [id number] (walking stretches)    Consulted and Agree with Plan of Care Patient             Patient will benefit from skilled therapeutic intervention in order to improve the following deficits and impairments:  Difficulty walking, Increased muscle spasms, Improper body mechanics, Decreased activity tolerance, Impaired flexibility, Postural dysfunction, Pain, Decreased strength  Visit Diagnosis: Chronic pain of right knee  Chronic pain of left knee     Problem List Patient Active Problem List   Diagnosis Date Noted   LUQ pain 09/15/2020   Calculus of gallbladder without cholecystitis without obstruction 09/15/2020   Hamstring tendinitis 09/10/2020   Pyrosis 07/21/2020   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   Right hip pain 06/12/2020   Pre-diabetes 03/12/2020   Seasonal allergies 03/12/2020   Obesity 10/30/2019   Low back pain 09/11/2019   Degenerative joint disease of knee, left 09/11/2019   Pancreatic cyst 01/25/2019   Gastroesophageal reflux disease 01/25/2019   Esophageal dysphagia 01/25/2019   Unintentional weight loss 01/25/2019   IGT (impaired glucose tolerance) 01/17/2019   BPPV (benign paroxysmal positional vertigo) 10/23/2012   Vertigo 10/21/2012   UTI (lower urinary tract infection) 10/21/2012   Nausea & vomiting 10/21/2012   Postoperative anemia due to acute blood loss 09/14/2012   OA (osteoarthritis) of knee 09/12/2012   Insomnia 08/20/2011   OBSTRUCTIVE SLEEP APNEA 04/23/2008   RESTLESS LEG SYNDROME 04/23/2008   FIBROMYALGIA 01/06/2007    C.  PT, DPT 11/28/20 4:27 PM   [facility] [address] [place], [place], [address] Phone: [phone]   Fax:  [phone]  Name: Cassandra [name] MRN: [id number] Date of Birth: 1946-04-21

## 2020-12-03 ENCOUNTER — Ambulatory Visit (HOSPITAL_BASED_OUTPATIENT_CLINIC_OR_DEPARTMENT_OTHER): Payer: PPO | Admitting: Physical Therapy

## 2020-12-03 ENCOUNTER — Other Ambulatory Visit: Payer: Self-pay

## 2020-12-03 ENCOUNTER — Encounter (HOSPITAL_BASED_OUTPATIENT_CLINIC_OR_DEPARTMENT_OTHER): Payer: Self-pay | Admitting: Physical Therapy

## 2020-12-03 DIAGNOSIS — G8929 Other chronic pain: Secondary | ICD-10-CM

## 2020-12-03 DIAGNOSIS — J3089 Other allergic rhinitis: Secondary | ICD-10-CM | POA: Diagnosis not present

## 2020-12-03 DIAGNOSIS — J301 Allergic rhinitis due to pollen: Secondary | ICD-10-CM | POA: Diagnosis not present

## 2020-12-03 DIAGNOSIS — M25561 Pain in right knee: Secondary | ICD-10-CM

## 2020-12-03 NOTE — Therapy (Signed)
[facility] [address] [place], [place], [address] Phone: [phone]   Fax:  [phone]  Physical Therapy Treatment  Patient Details  Name: Cassandra [name] MRN: [id number] Date of Birth: 02/05/1946 Referring Provider (PT): Charlann Boxer, DO   Encounter Date: 12/03/2020   PT End of Session - 12/03/20 1939     Visit Number 16    Number of Visits 21    Date for PT Re-Evaluation 12/12/20    Authorization Type [employer]    PT Start Time 1345    PT Stop Time 1430    PT Time Calculation (min) 45 min    Activity Tolerance Patient tolerated treatment well    Behavior During Therapy Anxious             Past Medical History:  Diagnosis Date   Anemia    Anxiety    Asthma    Back pain    BPPV (benign paroxysmal positional vertigo)    Chronic allergic conjunctivitis    Depression    Diverticulosis    Fibromyalgia    Gallbladder problem    Gallstones    GERD (gastroesophageal reflux disease)    Hiatal hernia    History of bladder stone    Iron (Fe) deficiency anemia    Joint pain    Lump in female breast    Mild persistent asthma    followed by pcp--- dr Alfonso Patten. sharma ([place] allergy/asthma)   OA (osteoarthritis)    knees, hands   OAB (overactive bladder)    OSA (obstructive sleep apnea)    per pt has not used cpap several years   Pancreatic cyst    PONV (postoperative nausea and vomiting)    RLS (restless legs syndrome)    Seasonal and perennial allergic rhinitis    SUI (stress urinary incontinence, female)    Swallowing difficulty    Vitamin B12 deficiency    Vitamin D deficiency    Wears glasses     Past Surgical History:  Procedure Laterality Date   BLADDER SUSPENSION  1990's   sling   bladder tacking  2010   with mesh   BREAST LUMPECTOMY Left 1985   Benign cyst   BREAST REDUCTION SURGERY Bilateral 2003 approx.   CORNEA LESION EXCISION Right 2005  approx.   CYSTOSCOPY N/A 07/12/2016    Procedure: CYSTOSCOPY, VAGINOSCOPY, EXAM UNDER ANESTHESIA, STONE LITHOTRIPSY,;  Surgeon: Cleon Gustin, MD;  Location: [facility];  Service: Urology;  Laterality: N/A;   CYSTOSCOPY N/A 10/11/2016   Procedure: CYSTOSCOPY;  Surgeon: Cleon Gustin, MD;  Location: [facility];  Service: Urology;  Laterality: N/A;   CYSTOSCOPY WITH LITHOLAPAXY N/A 10/11/2016   Procedure: CYSTOSCOPY WITH LITHOLAPAXY/ EXCISION OF MESH;  Surgeon: Cleon Gustin, MD;  Location: [facility];  Service: Urology;  Laterality: N/A;   CYSTOSCOPY WITH LITHOLAPAXY N/A 01/22/2019   Procedure: CYSTOSCOPY WITH LITHOLAPAXY;  Surgeon: Cleon Gustin, MD;  Location: [facility];  Service: Urology;  Laterality: N/A;  1 HR   HOLMIUM LASER APPLICATION  4/65/0354   Procedure: HOLMIUM LASER APPLICATION;  Surgeon: Cleon Gustin, MD;  Location: [facility];  Service: Urology;;   HOLMIUM LASER APPLICATION N/A 6/56/8127   Procedure: HOLMIUM LASER APPLICATION;  Surgeon: Cleon Gustin, MD;  Location: [facility];  Service: Urology;  Laterality: N/A;   RIGHT KNEE ARTHROSCOPY  2013   TOTAL KNEE ARTHROPLASTY Right 09/12/2012  Procedure: RIGHT TOTAL KNEE ARTHROPLASTY;  Surgeon: Gearlean Alf, MD;  Location: [facility];  Service: Orthopedics;  Laterality: Right;   VAGINAL HYSTERECTOMY  1984    There were no vitals filed for this visit.   Subjective Assessment - 12/03/20 1346     Subjective Hip and knee were bad on Sat, Sun and Mon but tues and today it has been fine. the side of my foot and heel have not hurt.    Currently in Pain? No/denies                               [facility] Adult PT Treatment/Exercise - 12/03/20 0001       Knee/Hip Exercises: Standing   Other Standing Knee Exercises hip hinge with dowel- added squat to floor      Manual Therapy   Soft tissue mobilization Rt piriformis, glut med/min                     PT Education - 12/03/20 1938     Education Details stress and pain & weight, discussed symptoms over the weekend    Person(s) Educated Patient    Methods Explanation    Comprehension Verbalized understanding              PT Short Term Goals - 11/10/20 1231       PT SHORT TERM GOAL #1   Title neutral innominate rotation    Status Achieved               PT Long Term Goals - 11/10/20 1231       PT LONG TERM GOAL #1   Title pt will be on path to use of exercise for weight loss    Baseline doing exercise but no weight loss at this point    Status On-going    Target Date 12/12/20      PT LONG TERM GOAL #2   Title able to sleep without "drawing up" of hamstrings    Baseline still noticing RLS at night    Status On-going    Target Date 12/12/20      PT LONG TERM GOAL #3   Title pt will demo gross LE strength 5/5    Baseline see flowsheet    Status On-going    Target Date 12/12/20      PT LONG TERM GOAL #4   Title pt will verbalize feeling balanced when walking    Baseline I still feel off balance when I am walking arond, maybe from vertigo per pt as it is mosly after she has gotten sweaty.    Status New    Target Date 12/12/20                   Plan - 12/03/20 1940     Clinical Impression Statement Pt was feeling really good today and has done her walk already but had increased pain over the weekend in her Rt hip and Lt knee- denies Lt lateral foot or Rt heel pain. S/s consistent with trigger point in Rt lateral hip rather than hip joint or SIJ pain. Reduced with manual therapy today. Pt is tearful today when talking about family stress- encouraged her to consider counseling as these stressers can negatively impact healing and weight loss journey. Modified golfers lift to double leg hip hinge and squat to floor as she said the golfers lift did not feel right  at home.    PT Treatment/Interventions ADLs/Self Care Home  Management;Moist Heat;Iontophoresis 4mg /ml Dexamethasone;Electrical Stimulation;Cryotherapy;Traction;Therapeutic activities;Functional mobility training;Stair training;Gait training;Therapeutic exercise;Balance training;Neuromuscular re-education;Patient/family education;Manual techniques;Taping;Dry needling;Passive range of motion    PT Next Visit Plan re-assess hip hinge and squat, cont manual or consider DN if appropriate    PT Home Exercise Plan [id number], increase 3d/week by 10 min walk, every 2 weeks incr 10 min  [id number] (walking stretches)    Consulted and Agree with Plan of Care Patient             Patient will benefit from skilled therapeutic intervention in order to improve the following deficits and impairments:  Difficulty walking, Increased muscle spasms, Improper body mechanics, Decreased activity tolerance, Impaired flexibility, Postural dysfunction, Pain, Decreased strength  Visit Diagnosis: Chronic pain of right knee  Chronic pain of left knee     Problem List Patient Active Problem List   Diagnosis Date Noted   LUQ pain 09/15/2020   Calculus of gallbladder without cholecystitis without obstruction 09/15/2020   Hamstring tendinitis 09/10/2020   Pyrosis 07/21/2020   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   Right hip pain 06/12/2020   Pre-diabetes 03/12/2020   Seasonal allergies 03/12/2020   Obesity 10/30/2019   Low back pain 09/11/2019   Degenerative joint disease of knee, left 09/11/2019   Pancreatic cyst 01/25/2019   Gastroesophageal reflux disease 01/25/2019   Esophageal dysphagia 01/25/2019   Unintentional weight loss 01/25/2019   IGT (impaired glucose tolerance) 01/17/2019   BPPV (benign paroxysmal positional vertigo) 10/23/2012   Vertigo 10/21/2012   UTI (lower urinary tract infection) 10/21/2012   Nausea & vomiting 10/21/2012   Postoperative anemia due to acute blood loss 09/14/2012   OA (osteoarthritis) of knee 09/12/2012    Insomnia 08/20/2011   OBSTRUCTIVE SLEEP APNEA 04/23/2008   RESTLESS LEG SYNDROME 04/23/2008   FIBROMYALGIA 01/06/2007    C.  PT, DPT 12/03/20 7:46 PM  [facility] [address] [place], [place], [address] Phone: [phone]   Fax:  [phone]  Name: TERIE LEAR MRN: [id number] Date of Birth: 25-Oct-1945

## 2020-12-04 NOTE — Progress Notes (Signed)
Chief Complaint:   OBESITY Cassandra [name] is here to discuss her progress with her obesity treatment plan along with follow-up of her obesity related diagnoses.   Today's visit was #: 15 Starting weight: 193 lbs Starting date: 11/13/2019 Today's weight: 201 lbs Today's date: 11/26/2020 Weight change since last visit: +2 lbs Total lbs lost to date: +8 lbs Body mass index is 33.45 kg/m.   Interim History:  Cassandra [name] says she would like a restart. Current Meal Plan: keeping a food journal and adhering to recommended goals of 1000-1200 calories and 75+ grams of protein for 80% of the time.  Current Exercise Plan: Walking for 1 mile 6-7 times per week.  Assessment/Plan:   Orders Placed This Encounter  Procedures   CBC with Differential/Platelet   Comprehensive metabolic panel   Insulin, random   Iron, TIBC and Ferritin Panel   Lipid panel   Vitamin B12   VITAMIN D 25 Hydroxy (Vit-D Deficiency, Fractures)   TSH   T4, free   1. Short of breath on exertion New IC was performed today.  RMR 1426.  2. Prediabetes Not at goal. Goal is HgbA1c < 5.7.  Medication: metformin 500 mg twice daily.    Plan:  She will continue to focus on protein-rich, low simple carbohydrate foods. We reviewed the importance of hydration, regular exercise for stress reduction, and restorative sleep.   Lab Results  Component Value Date   HGBA1C 5.6 04/17/2020   Lab Results  Component Value Date   INSULIN 19.1 11/26/2020   INSULIN 15.9 04/17/2020   INSULIN 30.1 (H) 11/13/2019   3. Vitamin D deficiency Improving, but not optimized.   Plan: Will check vitamin D level today.  Lab Results  Component Value Date   VD25OH 45.1 11/26/2020   VD25OH 127.0 (H) 04/17/2020   VD25OH 65.2 11/13/2019   4. Hyperlipidemia, unspecified hyperlipidemia type Course: At goal. Lipid-lowering medications: None.   Plan: Dietary changes: Increase soluble fiber, decrease simple carbohydrates, decrease saturated fat. Exercise  changes: Moderate to vigorous-intensity aerobic activity 150 minutes per week or as tolerated. We will continue to monitor along with PCP/specialists as it pertains to her weight loss journey.  Lab Results  Component Value Date   CHOL 173 11/26/2020   HDL 57 11/26/2020   LDLCALC 98 11/26/2020   TRIG 97 11/26/2020   CHOLHDL 3.0 11/26/2020   Lab Results  Component Value Date   ALT 8 11/26/2020   AST 19 11/26/2020   ALKPHOS 100 11/26/2020   BILITOT 0.4 11/26/2020   The 10-year ASCVD risk score Cassandra [name] DC Jr., et al., 2013) is: 20.1%   Values used to calculate the score:     Age: 75 years     Sex: Female     Is Non-Hispanic African American: No     Diabetic: Yes     Tobacco smoker: No     Systolic Blood Pressure: 939 mmHg     Is BP treated: Yes     HDL Cholesterol: 57 mg/dL     Total Cholesterol: 173 mg/dL  5. Obstructive sleep apnea OSA is a cause of systemic hypertension and is associated with an increased incidence of stroke, heart failure, atrial fibrillation, and coronary heart disease. Severe OSA increases all-cause mortality and cardiovascular mortality.   Goal: Treatment of OSA via CPAP compliance and weight loss. Plasma ghrelin levels (appetite or "hunger hormone") are significantly higher in OSA patients than in BMI-matched controls, but decrease to levels similar to those of obese  patients without OSA after CPAP treatment.  Weight loss improves OSA by several mechanisms, including reduction in fatty tissue in the throat (i.e. parapharyngeal fat) and the tongue. Loss of abdominal fat increases mediastinal traction on the upper airway making it less likely to collapse during sleep. Studies have also shown that compliance with CPAP treatment improves leptin (hunger inhibitory hormone) imbalance.  6. Vitamin B12 deficiency Lab Results  Component Value Date   VITAMINB12 507 11/26/2020   Supplementation:  None.  Plan:  Check vitamin B12 level today.  7. Other iron  deficiency anemia Nutrition: Iron-rich foods include dark leafy greens, red and white meats, eggs, seafood, and beans.  Certain foods and drinks prevent your body from absorbing iron properly. Avoid eating these foods in the same meal as iron-rich foods or with iron supplements. These foods include: coffee, black tea, and red wine; milk, dairy products, and foods that are high in calcium; beans and soybeans; whole grains. Constipation can be a side effect of iron supplementation. Increased water and fiber intake are helpful. Water goal: > 2 liters/day. Fiber goal: > 25 grams/day.  CBC Latest Ref Rng & Units 11/26/2020 11/13/2019 01/17/2019  WBC 3.4 - 10.8 x10E3/uL 6.7 7.7 7.2  Hemoglobin 11.1 - 15.9 g/dL 13.5 14.5 14.1  Hematocrit 34.0 - 46.6 % 42.4 47.4(H) 43.4  Platelets 150 - 450 x10E3/uL 266 283 279.0   Lab Results  Component Value Date   IRON 48 11/26/2020   TIBC 448 11/26/2020   FERRITIN 20 11/26/2020   Lab Results  Component Value Date   VITAMINB12 507 11/26/2020   8. Pancreatic cyst Will continue to follow along as it pertains to her weight loss journey.  9. Obesity with current BMI 33.12  Course: Cassandra [name] is currently in the action stage of change. As such, her goal is to continue with weight loss efforts.   Nutrition goals: She has agreed to practicing portion control and making smarter food choices, such as increasing vegetables and decreasing simple carbohydrates.   Exercise goals:  As is.  Behavioral modification strategies: increasing lean protein intake, decreasing simple carbohydrates, increasing vegetables, increasing water intake, and emotional eating strategies.  Cassandra [name] has agreed to follow-up with our clinic in 2-3 weeks. She was informed of the importance of frequent follow-up visits to maximize her success with intensive lifestyle modifications for her multiple health conditions.   Objective:   Blood pressure 102/65, [name] 68, temperature 98.1 F (36.7 C),  temperature source Oral, height 5\' 5"  (1.651 m), weight 201 lb (91.2 kg), SpO2 96 %. Body mass index is 33.45 kg/m.  General: Cooperative, alert, well developed, in no acute distress. HEENT: Conjunctivae and lids unremarkable. Cardiovascular: Regular rhythm.  Lungs: Normal work of breathing. Neurologic: No focal deficits.   Lab Results  Component Value Date   CREATININE 0.76 11/26/2020   BUN 11 11/26/2020   NA 140 11/26/2020   K 4.5 11/26/2020   CL 100 11/26/2020   CO2 24 11/26/2020   Lab Results  Component Value Date   ALT 8 11/26/2020   AST 19 11/26/2020   ALKPHOS 100 11/26/2020   BILITOT 0.4 11/26/2020   Lab Results  Component Value Date   HGBA1C 5.6 04/17/2020   HGBA1C 5.7 (H) 11/13/2019   HGBA1C 5.4 08/29/2019   HGBA1C 6.1 01/17/2019   HGBA1C 4.9 01/06/2007   Lab Results  Component Value Date   INSULIN 19.1 11/26/2020   INSULIN 15.9 04/17/2020   INSULIN 30.1 (H) 11/13/2019   Lab Results  Component Value Date   TSH 1.590 11/26/2020   Lab Results  Component Value Date   CHOL 173 11/26/2020   HDL 57 11/26/2020   LDLCALC 98 11/26/2020   TRIG 97 11/26/2020   CHOLHDL 3.0 11/26/2020   Lab Results  Component Value Date   VD25OH 45.1 11/26/2020   VD25OH 127.0 (H) 04/17/2020   VD25OH 65.2 11/13/2019   Lab Results  Component Value Date   WBC 6.7 11/26/2020   HGB 13.5 11/26/2020   HCT 42.4 11/26/2020   MCV 82 11/26/2020   PLT 266 11/26/2020   Lab Results  Component Value Date   IRON 48 11/26/2020   TIBC 448 11/26/2020   FERRITIN 20 11/26/2020   Obesity Behavioral Intervention:   Approximately 15 minutes were spent on the discussion below.  ASK: We discussed the diagnosis of obesity with Cassandra [name] today and Cassandra [name] agreed to give [place] permission to discuss obesity behavioral modification therapy today.  ASSESS: Cassandra [name] has the diagnosis of obesity and her BMI today is 33.6. Maanya is in the action stage of change.   ADVISE: Cassandra [name] was educated on the  multiple health risks of obesity as well as the benefit of weight loss to improve her health. She was advised of the need for long term treatment and the importance of lifestyle modifications to improve her current health and to decrease her risk of future health problems.  AGREE: Multiple dietary modification options and treatment options were discussed and Cassandra [name] agreed to follow the recommendations documented in the above note.  ARRANGE: Cassandra [name] was educated on the importance of frequent visits to treat obesity as outlined per CMS and USPSTF guidelines and agreed to schedule her next follow up appointment today.  Attestation Statements:   Reviewed by clinician on day of visit: allergies, medications, problem list, medical history, surgical history, family history, social history, and previous encounter notes.  I, Water quality scientist, CMA, am acting as transcriptionist for Briscoe Deutscher, DO  I have reviewed the above documentation for accuracy and completeness, and I agree with the above. Briscoe Deutscher, DO

## 2020-12-05 ENCOUNTER — Other Ambulatory Visit: Payer: Self-pay

## 2020-12-05 ENCOUNTER — Ambulatory Visit (HOSPITAL_BASED_OUTPATIENT_CLINIC_OR_DEPARTMENT_OTHER): Payer: PPO | Admitting: Physical Therapy

## 2020-12-05 ENCOUNTER — Encounter (HOSPITAL_BASED_OUTPATIENT_CLINIC_OR_DEPARTMENT_OTHER): Payer: Self-pay | Admitting: Physical Therapy

## 2020-12-05 DIAGNOSIS — J3089 Other allergic rhinitis: Secondary | ICD-10-CM | POA: Diagnosis not present

## 2020-12-05 DIAGNOSIS — J301 Allergic rhinitis due to pollen: Secondary | ICD-10-CM | POA: Diagnosis not present

## 2020-12-05 DIAGNOSIS — J3081 Allergic rhinitis due to animal (cat) (dog) hair and dander: Secondary | ICD-10-CM | POA: Diagnosis not present

## 2020-12-05 DIAGNOSIS — M25561 Pain in right knee: Secondary | ICD-10-CM

## 2020-12-05 DIAGNOSIS — G8929 Other chronic pain: Secondary | ICD-10-CM

## 2020-12-05 NOTE — Therapy (Signed)
Cassandra [name] [address] [place], [place], [address] Phone: [phone]   Fax:  [phone]  Physical Therapy Treatment  Patient Details  Name: Cassandra [name] MRN: [id number] Date of Birth: [birth year] Referring Provider (PT): Charlann Boxer, DO   Encounter Date: 12/05/2020   PT End of Session - 12/05/20 1349     Visit Number 17    Number of Visits 21    Date for PT Re-Evaluation 12/12/20    Authorization Type [employer]    PT Start Time 1347    PT Stop Time 1429    PT Time Calculation (min) 42 min    Activity Tolerance Patient tolerated treatment well    Behavior During Therapy [name] for tasks assessed/performed             Past Medical History:  Diagnosis Date   Anemia    Anxiety    Asthma    Back pain    BPPV (benign paroxysmal positional vertigo)    Chronic allergic conjunctivitis    Depression    Diverticulosis    Fibromyalgia    Gallbladder problem    Gallstones    GERD (gastroesophageal reflux disease)    Hiatal hernia    History of bladder stone    Iron (Fe) deficiency anemia    Joint pain    Lump in female breast    Mild persistent asthma    followed by pcp--- dr Alfonso Patten. sharma ([place] allergy/asthma)   OA (osteoarthritis)    knees, hands   OAB (overactive bladder)    OSA (obstructive sleep apnea)    per pt has not used cpap several years   Pancreatic cyst    PONV (postoperative nausea and vomiting)    RLS (restless legs syndrome)    Seasonal and perennial allergic rhinitis    SUI (stress urinary incontinence, female)    Swallowing difficulty    Vitamin B12 deficiency    Vitamin D deficiency    Wears glasses     Past Surgical History:  Procedure Laterality Date   BLADDER SUSPENSION  1990's   sling   bladder tacking  2010   with mesh   BREAST LUMPECTOMY Left 1985   Benign cyst   BREAST REDUCTION SURGERY Bilateral 2003 approx.   CORNEA LESION EXCISION Right 2005  approx.    CYSTOSCOPY N/A 07/12/2016   Procedure: CYSTOSCOPY, VAGINOSCOPY, EXAM UNDER ANESTHESIA, STONE LITHOTRIPSY,;  Surgeon: Cleon Gustin, MD;  Location: [facility];  Service: Urology;  Laterality: N/A;   CYSTOSCOPY N/A 10/11/2016   Procedure: CYSTOSCOPY;  Surgeon: Cleon Gustin, MD;  Location: [facility];  Service: Urology;  Laterality: N/A;   CYSTOSCOPY WITH LITHOLAPAXY N/A 10/11/2016   Procedure: CYSTOSCOPY WITH LITHOLAPAXY/ EXCISION OF MESH;  Surgeon: Cleon Gustin, MD;  Location: [facility];  Service: Urology;  Laterality: N/A;   CYSTOSCOPY WITH LITHOLAPAXY N/A 01/22/2019   Procedure: CYSTOSCOPY WITH LITHOLAPAXY;  Surgeon: Cleon Gustin, MD;  Location: [facility];  Service: Urology;  Laterality: N/A;  1 HR   HOLMIUM LASER APPLICATION  7/94/8016   Procedure: HOLMIUM LASER APPLICATION;  Surgeon: Cleon Gustin, MD;  Location: [facility];  Service: Urology;;   HOLMIUM LASER APPLICATION N/A 5/53/7482   Procedure: HOLMIUM LASER APPLICATION;  Surgeon: Cleon Gustin, MD;  Location: [facility];  Service: Urology;  Laterality: N/A;   RIGHT KNEE ARTHROSCOPY  2013   TOTAL KNEE ARTHROPLASTY Right  09/12/2012   Procedure: RIGHT TOTAL KNEE ARTHROPLASTY;  Surgeon: Gearlean Alf, MD;  Location: [facility];  Service: Orthopedics;  Laterality: Right;   VAGINAL HYSTERECTOMY  1984    There were no vitals filed for this visit.   Subjective Assessment - 12/05/20 1348     Subjective I actually gained a pound- not sure what is happening. otherwise feeling pretty good.    Currently in Pain? No/denies                               [facility] Adult PT Treatment/Exercise - 12/05/20 0001       Knee/Hip Exercises: Stretches   Passive Hamstring Stretch Limitations seated EOB    Piriformis Stretch Limitations seated figure 4      Knee/Hip Exercises: Standing   Forward Lunges  Limitations Lt foot fwd/Rt punch green tband    Gait Training worked on [place] trunk rotation    Other Standing Knee Exercises hip hinge to squat- 10lb kettle bell to floor    Other Standing Knee Exercises Lt UE adduction green tband      Knee/Hip Exercises: Supine   Other Supine Knee/Hip Exercises bil TT to knee ext                      PT Short Term Goals - 11/10/20 1231       PT SHORT TERM GOAL #1   Title neutral innominate rotation    Status Achieved               PT Long Term Goals - 11/10/20 1231       PT LONG TERM GOAL #1   Title pt will be on path to use of exercise for weight loss    Baseline doing exercise but no weight loss at this point    Status On-going    Target Date 12/12/20      PT LONG TERM GOAL #2   Title able to sleep without "drawing up" of hamstrings    Baseline still noticing RLS at night    Status On-going    Target Date 12/12/20      PT LONG TERM GOAL #3   Title pt will demo gross LE strength 5/5    Baseline see flowsheet    Status On-going    Target Date 12/12/20      PT LONG TERM GOAL #4   Title pt will verbalize feeling balanced when walking    Baseline I still feel off balance when I am walking arond, maybe from vertigo per pt as it is mosly after she has gotten sweaty.    Status New    Target Date 12/12/20                   Plan - 12/05/20 1426     Clinical Impression Statement Difficulty rotating trunk to the Left in gait- added to HEP to encourage bil rotation. End of POC next week-will ext to 1/week for a few weeks.    PT Treatment/Interventions ADLs/Self Care Home Management;Moist Heat;Iontophoresis 4mg /ml Dexamethasone;Electrical Stimulation;Cryotherapy;Traction;Therapeutic activities;Functional mobility training;Stair training;Gait training;Therapeutic exercise;Balance training;Neuromuscular re-education;Patient/family education;Manual techniques;Taping;Dry needling;Passive range of motion    PT Home  Exercise Plan [id number], increase 3d/week by 10 min walk, every 2 weeks incr 10 min  [id number] (walking stretches)    Consulted and Agree with Plan of Care Patient  Patient will benefit from skilled therapeutic intervention in order to improve the following deficits and impairments:  Difficulty walking, Increased muscle spasms, Improper body mechanics, Decreased activity tolerance, Impaired flexibility, Postural dysfunction, Pain, Decreased strength  Visit Diagnosis: Chronic pain of right knee  Chronic pain of left knee     Problem List Patient Active Problem List   Diagnosis Date Noted   LUQ pain 09/15/2020   Calculus of gallbladder without cholecystitis without obstruction 09/15/2020   Hamstring tendinitis 09/10/2020   Pyrosis 07/21/2020   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   Right hip pain 06/12/2020   Pre-diabetes 03/12/2020   Seasonal allergies 03/12/2020   Obesity 10/30/2019   Low back pain 09/11/2019   Degenerative joint disease of knee, left 09/11/2019   Pancreatic cyst 01/25/2019   Gastroesophageal reflux disease 01/25/2019   Esophageal dysphagia 01/25/2019   Unintentional weight loss 01/25/2019   IGT (impaired glucose tolerance) 01/17/2019   BPPV (benign paroxysmal positional vertigo) 10/23/2012   Vertigo 10/21/2012   UTI (lower urinary tract infection) 10/21/2012   Nausea & vomiting 10/21/2012   Postoperative anemia due to acute blood loss 09/14/2012   OA (osteoarthritis) of knee 09/12/2012   Insomnia 08/20/2011   OBSTRUCTIVE SLEEP APNEA 04/23/2008   RESTLESS LEG SYNDROME 04/23/2008   FIBROMYALGIA 01/06/2007     C.  PT, DPT 12/05/20 2:46 PM   [facility] [address] [place], [place], [address] Phone: [phone]   Fax:  [phone]  Name: Cassandra [name] MRN: [id number] Date of Birth: 1945/08/13

## 2020-12-09 ENCOUNTER — Encounter (HOSPITAL_BASED_OUTPATIENT_CLINIC_OR_DEPARTMENT_OTHER): Payer: Self-pay | Admitting: Physical Therapy

## 2020-12-09 DIAGNOSIS — J301 Allergic rhinitis due to pollen: Secondary | ICD-10-CM | POA: Diagnosis not present

## 2020-12-09 DIAGNOSIS — J3089 Other allergic rhinitis: Secondary | ICD-10-CM | POA: Diagnosis not present

## 2020-12-10 ENCOUNTER — Ambulatory Visit (HOSPITAL_BASED_OUTPATIENT_CLINIC_OR_DEPARTMENT_OTHER): Payer: PPO | Admitting: Physical Therapy

## 2020-12-10 ENCOUNTER — Other Ambulatory Visit: Payer: Self-pay

## 2020-12-10 ENCOUNTER — Encounter (HOSPITAL_BASED_OUTPATIENT_CLINIC_OR_DEPARTMENT_OTHER): Payer: Self-pay | Admitting: Physical Therapy

## 2020-12-10 DIAGNOSIS — M25562 Pain in left knee: Secondary | ICD-10-CM

## 2020-12-10 DIAGNOSIS — M25561 Pain in right knee: Secondary | ICD-10-CM

## 2020-12-10 DIAGNOSIS — G8929 Other chronic pain: Secondary | ICD-10-CM

## 2020-12-10 NOTE — Therapy (Signed)
[facility] [address] [place], [place], [address] Phone: [phone]   Fax:  [phone]  Physical Therapy Treatment  Patient Details  Name: Cassandra [name] MRN: [id number] Date of Birth: [birth year] Referring Provider (PT): Charlann Boxer, DO   Encounter Date: 12/10/2020   PT End of Session - 12/10/20 1350     Visit Number 18    Number of Visits 21    Date for PT Re-Evaluation 12/12/20    Authorization Type [employer]    PT Start Time 1345    PT Stop Time 1426    PT Time Calculation (min) 41 min    Activity Tolerance Patient tolerated treatment well    Behavior During Therapy [facility] for tasks assessed/performed             Past Medical History:  Diagnosis Date   Anemia    Anxiety    Asthma    Back pain    BPPV (benign paroxysmal positional vertigo)    Chronic allergic conjunctivitis    Depression    Diverticulosis    Fibromyalgia    Gallbladder problem    Gallstones    GERD (gastroesophageal reflux disease)    Hiatal hernia    History of bladder stone    Iron (Fe) deficiency anemia    Joint pain    Lump in female breast    Mild persistent asthma    followed by pcp--- dr Alfonso Patten. sharma ([place] allergy/asthma)   OA (osteoarthritis)    knees, hands   OAB (overactive bladder)    OSA (obstructive sleep apnea)    per pt has not used cpap several years   Pancreatic cyst    PONV (postoperative nausea and vomiting)    RLS (restless legs syndrome)    Seasonal and perennial allergic rhinitis    SUI (stress urinary incontinence, female)    Swallowing difficulty    Vitamin B12 deficiency    Vitamin D deficiency    Wears glasses     Past Surgical History:  Procedure Laterality Date   BLADDER SUSPENSION  1990's   sling   bladder tacking  2010   with mesh   BREAST LUMPECTOMY Left 1985   Benign cyst   BREAST REDUCTION SURGERY Bilateral 2003 approx.   CORNEA LESION EXCISION Right 2005  approx.    CYSTOSCOPY N/A 07/12/2016   Procedure: CYSTOSCOPY, VAGINOSCOPY, EXAM UNDER ANESTHESIA, STONE LITHOTRIPSY,;  Surgeon: Cleon Gustin, MD;  Location[facility];  Service: Urology;  Laterality: N/A;   CYSTOSCOPY N/A 10/11/2016   Procedure: CYSTOSCOPY;  Surgeon: Cleon Gustin, MD;  Location: [facility];  Service: Urology;  Laterality: N/A;   CYSTOSCOPY WITH LITHOLAPAXY N/A 10/11/2016   Procedure: CYSTOSCOPY WITH LITHOLAPAXY/ EXCISION OF MESH;  Surgeon: Cleon Gustin, MD;  Location: [facility];  Service: Urology;  Laterality: N/A;   CYSTOSCOPY WITH LITHOLAPAXY N/A 01/22/2019   Procedure: CYSTOSCOPY WITH LITHOLAPAXY;  Surgeon: Cleon Gustin, MD;  Location: [facility];  Service: Urology;  Laterality: N/A;  1 HR   HOLMIUM LASER APPLICATION  2/42/6834   Procedure: HOLMIUM LASER APPLICATION;  Surgeon: Cleon Gustin, MD;  Location: [facility];  Service: Urology;;   HOLMIUM LASER APPLICATION N/A 1/96/2229   Procedure: HOLMIUM LASER APPLICATION;  Surgeon: Cleon Gustin, MD;  Location: [facility];  Service: Urology;  Laterality: N/A;   RIGHT KNEE ARTHROSCOPY  2013   TOTAL KNEE ARTHROPLASTY Right  09/12/2012   Procedure: RIGHT TOTAL KNEE ARTHROPLASTY;  Surgeon: Gearlean Alf, MD;  Location: [facility];  Service: Orthopedics;  Laterality: Right;   VAGINAL HYSTERECTOMY  1984    There were no vitals filed for this visit.   Subjective Assessment - 12/10/20 1348     Subjective Twisted on my knee which didn't feel good, a little swollen.    Currently in Pain? No/denies                               [facility] Adult PT Treatment/Exercise - 12/10/20 0001       Knee/Hip Exercises: Stretches   Passive Hamstring Stretch Limitations seated EOB    Piriformis Stretch Limitations seated figure 4      Knee/Hip Exercises: Standing   Functional Squat Limitations reaching to 4"  step, squat to lift & put down 4" step turned on its side    SLS golfer hinge- reaching to bar    SLS with Vectors hip hike wiht foot on 4" step    Other Standing Knee Exercises tandem gait with cues to avoid supination    Other Standing Knee Exercises wall sits 3x30s                      PT Short Term Goals - 11/10/20 1231       PT SHORT TERM GOAL #1   Title neutral innominate rotation    Status Achieved               PT Long Term Goals - 11/10/20 1231       PT LONG TERM GOAL #1   Title pt will be on path to use of exercise for weight loss    Baseline doing exercise but no weight loss at this point    Status On-going    Target Date 12/12/20      PT LONG TERM GOAL #2   Title able to sleep without "drawing up" of hamstrings    Baseline still noticing RLS at night    Status On-going    Target Date 12/12/20      PT LONG TERM GOAL #3   Title pt will demo gross LE strength 5/5    Baseline see flowsheet    Status On-going    Target Date 12/12/20      PT LONG TERM GOAL #4   Title pt will verbalize feeling balanced when walking    Baseline I still feel off balance when I am walking arond, maybe from vertigo per pt as it is mosly after she has gotten sweaty.    Status New    Target Date 12/12/20                   Plan - 12/10/20 1427     Clinical Impression Statement Required heavy cuing for small range golfers lift, she has necessary strength to do so but is fearful of falling. Asked her to perform hip hike with foot in cabinet while brushing her teeth and wall sits through her day.    PT Treatment/Interventions ADLs/Self Care Home Management;Moist Heat;Iontophoresis 4mg /ml Dexamethasone;Electrical Stimulation;Cryotherapy;Traction;Therapeutic activities;Functional mobility training;Stair training;Gait training;Therapeutic exercise;Balance training;Neuromuscular re-education;Patient/family education;Manual techniques;Taping;Dry needling;Passive range  of motion    PT Next Visit Plan ERO to 1/week    PT Home Exercise Plan [id number], increase 3d/week by 10 min walk, every 2 weeks incr 10 min  [id number] (walking stretches)  Consulted and Agree with Plan of Care Patient             Patient will benefit from skilled therapeutic intervention in order to improve the following deficits and impairments:  Difficulty walking, Increased muscle spasms, Improper body mechanics, Decreased activity tolerance, Impaired flexibility, Postural dysfunction, Pain, Decreased strength  Visit Diagnosis: Chronic pain of right knee  Chronic pain of left knee     Problem List Patient Active Problem List   Diagnosis Date Noted   LUQ pain 09/15/2020   Calculus of gallbladder without cholecystitis without obstruction 09/15/2020   Hamstring tendinitis 09/10/2020   Pyrosis 07/21/2020   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   Right hip pain 06/12/2020   Pre-diabetes 03/12/2020   Seasonal allergies 03/12/2020   Obesity 10/30/2019   Low back pain 09/11/2019   Degenerative joint disease of knee, left 09/11/2019   Pancreatic cyst 01/25/2019   Gastroesophageal reflux disease 01/25/2019   Esophageal dysphagia 01/25/2019   Unintentional weight loss 01/25/2019   IGT (impaired glucose tolerance) 01/17/2019   BPPV (benign paroxysmal positional vertigo) 10/23/2012   Vertigo 10/21/2012   UTI (lower urinary tract infection) 10/21/2012   Nausea & vomiting 10/21/2012   Postoperative anemia due to acute blood loss 09/14/2012   OA (osteoarthritis) of knee 09/12/2012   Insomnia 08/20/2011   OBSTRUCTIVE SLEEP APNEA 04/23/2008   RESTLESS LEG SYNDROME 04/23/2008   FIBROMYALGIA 01/06/2007    C.  PT, DPT 12/10/20 2:29 PM  [facility] [place], [place], [address] Phone: [phone]   Fax:  [phone]  Name: Cassandra [name] MRN: [id number] Date of Birth:  [birth year]

## 2020-12-11 ENCOUNTER — Encounter: Payer: Self-pay | Admitting: Internal Medicine

## 2020-12-11 DIAGNOSIS — H524 Presbyopia: Secondary | ICD-10-CM | POA: Diagnosis not present

## 2020-12-11 DIAGNOSIS — H25819 Combined forms of age-related cataract, unspecified eye: Secondary | ICD-10-CM | POA: Diagnosis not present

## 2020-12-11 DIAGNOSIS — H2589 Other age-related cataract: Secondary | ICD-10-CM | POA: Diagnosis not present

## 2020-12-11 DIAGNOSIS — H52223 Regular astigmatism, bilateral: Secondary | ICD-10-CM | POA: Diagnosis not present

## 2020-12-11 DIAGNOSIS — H5203 Hypermetropia, bilateral: Secondary | ICD-10-CM | POA: Diagnosis not present

## 2020-12-12 ENCOUNTER — Ambulatory Visit (HOSPITAL_BASED_OUTPATIENT_CLINIC_OR_DEPARTMENT_OTHER): Payer: PPO | Admitting: Physical Therapy

## 2020-12-12 ENCOUNTER — Other Ambulatory Visit: Payer: Self-pay

## 2020-12-12 ENCOUNTER — Encounter (HOSPITAL_BASED_OUTPATIENT_CLINIC_OR_DEPARTMENT_OTHER): Payer: Self-pay | Admitting: Physical Therapy

## 2020-12-12 DIAGNOSIS — M25561 Pain in right knee: Secondary | ICD-10-CM | POA: Diagnosis not present

## 2020-12-12 DIAGNOSIS — G8929 Other chronic pain: Secondary | ICD-10-CM

## 2020-12-12 DIAGNOSIS — M25562 Pain in left knee: Secondary | ICD-10-CM

## 2020-12-12 NOTE — Therapy (Signed)
[facility] [address] [place], [place], [address] Phone: [phone]   Fax:  [phone]  Physical Therapy Treatment  Patient Details  Name: Cassandra [name] MRN: [id number] Date of Birth: March 10, 1946 Referring Provider (PT): Charlann Boxer, DO   Encounter Date: 12/12/2020   PT End of Session - 12/12/20 1427     Visit Number 19    Number of Visits 23    Date for PT Re-Evaluation 01/09/21    Authorization Type [employer]    PT Start Time 1345    PT Stop Time 1430    PT Time Calculation (min) 45 min    Activity Tolerance Patient tolerated treatment well    Behavior During Therapy [facility] for tasks assessed/performed             Past Medical History:  Diagnosis Date   Anemia    Anxiety    Asthma    Back pain    BPPV (benign paroxysmal positional vertigo)    Chronic allergic conjunctivitis    Depression    Diverticulosis    Fibromyalgia    Gallbladder problem    Gallstones    GERD (gastroesophageal reflux disease)    Hiatal hernia    History of bladder stone    Iron (Fe) deficiency anemia    Joint pain    Lump in female breast    Mild persistent asthma    followed by pcp--- dr Alfonso Patten. sharma (Baker allergy/asthma)   OA (osteoarthritis)    knees, hands   OAB (overactive bladder)    OSA (obstructive sleep apnea)    per pt has not used cpap several years   Pancreatic cyst    PONV (postoperative nausea and vomiting)    RLS (restless legs syndrome)    Seasonal and perennial allergic rhinitis    SUI (stress urinary incontinence, female)    Swallowing difficulty    Vitamin B12 deficiency    Vitamin D deficiency    Wears glasses     Past Surgical History:  Procedure Laterality Date   BLADDER SUSPENSION  1990's   sling   bladder tacking  2010   with mesh   BREAST LUMPECTOMY Left 1985   Benign cyst   BREAST REDUCTION SURGERY Bilateral 2003 approx.   CORNEA LESION EXCISION Right 2005  approx.    CYSTOSCOPY N/A 07/12/2016   Procedure: CYSTOSCOPY, VAGINOSCOPY, EXAM UNDER ANESTHESIA, STONE LITHOTRIPSY,;  Surgeon: Cleon Gustin, MD;  Location: [facility];  Service: Urology;  Laterality: N/A;   CYSTOSCOPY N/A 10/11/2016   Procedure: CYSTOSCOPY;  Surgeon: Cleon Gustin, MD;  Location: [facility];  Service: Urology;  Laterality: N/A;   CYSTOSCOPY WITH LITHOLAPAXY N/A 10/11/2016   Procedure: CYSTOSCOPY WITH LITHOLAPAXY/ EXCISION OF MESH;  Surgeon: Cleon Gustin, MD;  Location: [facility];  Service: Urology;  Laterality: N/A;   CYSTOSCOPY WITH LITHOLAPAXY N/A 01/22/2019   Procedure: CYSTOSCOPY WITH LITHOLAPAXY;  Surgeon: Cleon Gustin, MD;  Location: [facility];  Service: Urology;  Laterality: N/A;  1 HR   HOLMIUM LASER APPLICATION  0/16/0109   Procedure: HOLMIUM LASER APPLICATION;  Surgeon: Cleon Gustin, MD;  Location: [facility];  Service: Urology;;   HOLMIUM LASER APPLICATION N/A 08/21/5571   Procedure: HOLMIUM LASER APPLICATION;  Surgeon: Cleon Gustin, MD;  Location: [facility];  Service: Urology;  Laterality: N/A;   RIGHT KNEE ARTHROSCOPY  2013   TOTAL KNEE ARTHROPLASTY Right  09/12/2012   Procedure: RIGHT TOTAL KNEE ARTHROPLASTY;  Surgeon: Gearlean Alf, MD;  Location: [facility];  Service: Orthopedics;  Laterality: Right;   VAGINAL HYSTERECTOMY  1984    There were no vitals filed for this visit.   Subjective Assessment - 12/12/20 1350     Subjective Knee was fine before i started walking and it is really sore. Pain just distal to knee with significant swelling.    Patient Stated Goals lose weight    Currently in Pain? Yes    Pain Score 7     Pain Location Knee    Pain Orientation Left;Distal;Medial    Pain Descriptors / Indicators Sore                [facility] PT Assessment - 12/12/20 0001       Assessment   Medical Diagnosis chronic knee pain, bilat     Referring Provider (PT) Charlann Boxer, DO      Precautions   Precautions None      Restrictions   Weight Bearing Restrictions No      Balance Screen   Has the patient fallen in the past 6 months No      Prior Function   Level of Independence Independent    Vocation Retired      Circumferential Edema   Circumferential - Left  Lt distal, medial knee edema      Posture/Postural Control   Posture Comments WFL      Strength   Right Hip Flexion 5/5    Right Hip ABduction 4+/5    Left Hip Flexion 5/5    Left Hip ABduction 4+/5      Palpation   Palpation comment TTP Lt pes anserine      High Level Balance   High Level Balance Comments Lt 5s, Rt 10+                           [facility] Adult PT Treatment/Exercise - 12/12/20 0001       Knee/Hip Exercises: Stretches   Passive Hamstring Stretch Limitations standing with strap to pull foot off      Knee/Hip Exercises: Sidelying   Hip ABduction Both;15 reps    Hip ABduction Limitations +10 with added hip flx/ext      Modalities   Modalities Cryotherapy      Cryotherapy   Number Minutes Cryotherapy 5 Minutes    Cryotherapy Location Knee   Lt pes anserine   Type of Cryotherapy Ice massage      Manual Therapy   Soft tissue mobilization roller Lt quads, adductors with leg hanging off of table                    PT Education - 12/12/20 1445     Education Details anatomy of condition, POC, pes anserine & walking    Person(s) Educated Patient    Methods Explanation;Handout;Demonstration;Tactile cues;Verbal cues    Comprehension Verbalized understanding;Need further instruction;Returned demonstration;Verbal cues required;Tactile cues required              PT Short Term Goals - 11/10/20 1231       PT SHORT TERM GOAL #1   Title neutral innominate rotation    Status Achieved               PT Long Term Goals - 12/12/20 1444       PT LONG TERM GOAL #1   Title pt  will be on path to use  of exercise for weight loss    Baseline has lost about 1lb    Status On-going      PT LONG TERM GOAL #2   Title able to sleep without "drawing up" of hamstrings    Baseline less intense but still noticing RLE at night    Status On-going      PT LONG TERM GOAL #3   Title pt will demo gross LE strength 5/5    Baseline see flowsheet    Status Partially Met      PT LONG TERM GOAL #4   Title pt will verbalize feeling balanced when walking    Baseline occasionally have to catch myself but is better and I am walking about 1.15 miles      PT LONG TERM GOAL #5   Title resolution of knee edema    Baseline pes anserine edema.    Time 4    Period Weeks    Status New    Target Date 01/09/21                   Plan - 12/12/20 1439     Clinical Impression Statement S/s consisent with pes anserine bursitis and reduced pain with stretching/rolling involved musculature followed by ice massage. Added hip abd strengthening and thigh stretching to HEP/walking stretch routine. Extending POC to 1/week for 4 weeks in order to continue addressing deficits and progressing strength/flexibility and gait pattern.    PT Frequency 1x / week    PT Duration 4 weeks    PT Treatment/Interventions ADLs/Self Care Home Management;Moist Heat;Iontophoresis 30m/ml Dexamethasone;Electrical Stimulation;Cryotherapy;Traction;Therapeutic activities;Functional mobility training;Stair training;Gait training;Therapeutic exercise;Balance training;Neuromuscular re-education;Patient/family education;Manual techniques;Taping;Dry needling;Passive range of motion    PT Next Visit Plan recheck pes anserine, hip abd strength    PT Home Exercise Plan [id number], increase 3d/week by 10 min walk, every 2 weeks incr 10 min  [id number] (walking stretches)    Consulted and Agree with Plan of Care Patient             Patient will benefit from skilled therapeutic intervention in order to improve the following deficits and  impairments:  Difficulty walking, Increased muscle spasms, Improper body mechanics, Decreased activity tolerance, Impaired flexibility, Postural dysfunction, Pain, Decreased strength  Visit Diagnosis: Chronic pain of right knee - Plan: PT plan of care cert/re-cert  Chronic pain of left knee - Plan: PT plan of care cert/re-cert     Problem List Patient Active Problem List   Diagnosis Date Noted   LUQ pain 09/15/2020   Calculus of gallbladder without cholecystitis without obstruction 09/15/2020   Hamstring tendinitis 09/10/2020   Pyrosis 07/21/2020   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   Right hip pain 06/12/2020   Pre-diabetes 03/12/2020   Seasonal allergies 03/12/2020   Obesity 10/30/2019   Low back pain 09/11/2019   Degenerative joint disease of knee, left 09/11/2019   Pancreatic cyst 01/25/2019   Gastroesophageal reflux disease 01/25/2019   Esophageal dysphagia 01/25/2019   Unintentional weight loss 01/25/2019   IGT (impaired glucose tolerance) 01/17/2019   BPPV (benign paroxysmal positional vertigo) 10/23/2012   Vertigo 10/21/2012   UTI (lower urinary tract infection) 10/21/2012   Nausea & vomiting 10/21/2012   Postoperative anemia due to acute blood loss 09/14/2012   OA (osteoarthritis) of knee 09/12/2012   Insomnia 08/20/2011   OBSTRUCTIVE SLEEP APNEA 04/23/2008   RESTLESS LEG SYNDROME 04/23/2008   FIBROMYALGIA 01/06/2007    C.  PT,  DPT 12/12/20 2:48 PM  [facility] [address] [place], [place], [address] Phone: [phone]   Fax:  [phone]  Name: CONSEPCION UTT MRN: [id number] Date of Birth: 09/05/1945

## 2020-12-15 ENCOUNTER — Other Ambulatory Visit: Payer: Self-pay

## 2020-12-15 ENCOUNTER — Encounter (INDEPENDENT_AMBULATORY_CARE_PROVIDER_SITE_OTHER): Payer: Self-pay | Admitting: Family Medicine

## 2020-12-15 ENCOUNTER — Ambulatory Visit (INDEPENDENT_AMBULATORY_CARE_PROVIDER_SITE_OTHER): Payer: PPO | Admitting: Family Medicine

## 2020-12-15 VITALS — BP 121/74 | HR 73 | Temp 98.9°F | Ht 65.0 in | Wt 202.0 lb

## 2020-12-15 DIAGNOSIS — E66811 Obesity, class 1: Secondary | ICD-10-CM

## 2020-12-15 DIAGNOSIS — Z9189 Other specified personal risk factors, not elsewhere classified: Secondary | ICD-10-CM

## 2020-12-15 DIAGNOSIS — Z6832 Body mass index (BMI) 32.0-32.9, adult: Secondary | ICD-10-CM | POA: Diagnosis not present

## 2020-12-15 DIAGNOSIS — E669 Obesity, unspecified: Secondary | ICD-10-CM | POA: Diagnosis not present

## 2020-12-15 DIAGNOSIS — D508 Other iron deficiency anemias: Secondary | ICD-10-CM | POA: Diagnosis not present

## 2020-12-15 DIAGNOSIS — R7303 Prediabetes: Secondary | ICD-10-CM | POA: Diagnosis not present

## 2020-12-15 DIAGNOSIS — E559 Vitamin D deficiency, unspecified: Secondary | ICD-10-CM | POA: Diagnosis not present

## 2020-12-17 ENCOUNTER — Emergency Department (HOSPITAL_COMMUNITY)
Admission: EM | Admit: 2020-12-17 | Discharge: 2020-12-17 | Disposition: A | Discharge status: Home / Self Care | Payer: PPO | Attending: Emergency Medicine | Admitting: Emergency Medicine

## 2020-12-17 ENCOUNTER — Emergency Department (HOSPITAL_COMMUNITY): Payer: PPO

## 2020-12-17 ENCOUNTER — Ambulatory Visit (HOSPITAL_BASED_OUTPATIENT_CLINIC_OR_DEPARTMENT_OTHER): Payer: PPO | Admitting: Physical Therapy

## 2020-12-17 ENCOUNTER — Encounter (HOSPITAL_COMMUNITY): Payer: Self-pay | Admitting: Emergency Medicine

## 2020-12-17 DIAGNOSIS — K869 Disease of pancreas, unspecified: Secondary | ICD-10-CM | POA: Diagnosis not present

## 2020-12-17 DIAGNOSIS — R1013 Epigastric pain: Secondary | ICD-10-CM | POA: Diagnosis not present

## 2020-12-17 DIAGNOSIS — K219 Gastro-esophageal reflux disease without esophagitis: Secondary | ICD-10-CM | POA: Insufficient documentation

## 2020-12-17 DIAGNOSIS — Z8507 Personal history of malignant neoplasm of pancreas: Secondary | ICD-10-CM | POA: Insufficient documentation

## 2020-12-17 DIAGNOSIS — K8020 Calculus of gallbladder without cholecystitis without obstruction: Secondary | ICD-10-CM | POA: Diagnosis not present

## 2020-12-17 DIAGNOSIS — Z79899 Other long term (current) drug therapy: Secondary | ICD-10-CM | POA: Diagnosis not present

## 2020-12-17 DIAGNOSIS — R079 Chest pain, unspecified: Secondary | ICD-10-CM | POA: Diagnosis not present

## 2020-12-17 DIAGNOSIS — R109 Unspecified abdominal pain: Secondary | ICD-10-CM | POA: Diagnosis not present

## 2020-12-17 DIAGNOSIS — K8689 Other specified diseases of pancreas: Secondary | ICD-10-CM | POA: Diagnosis not present

## 2020-12-17 DIAGNOSIS — Z7984 Long term (current) use of oral hypoglycemic drugs: Secondary | ICD-10-CM | POA: Insufficient documentation

## 2020-12-17 DIAGNOSIS — R1011 Right upper quadrant pain: Secondary | ICD-10-CM | POA: Diagnosis not present

## 2020-12-17 DIAGNOSIS — J45909 Unspecified asthma, uncomplicated: Secondary | ICD-10-CM | POA: Diagnosis not present

## 2020-12-17 DIAGNOSIS — R10811 Right upper quadrant abdominal tenderness: Secondary | ICD-10-CM

## 2020-12-17 DIAGNOSIS — R1110 Vomiting, unspecified: Secondary | ICD-10-CM | POA: Diagnosis not present

## 2020-12-17 DIAGNOSIS — K760 Fatty (change of) liver, not elsewhere classified: Secondary | ICD-10-CM | POA: Diagnosis not present

## 2020-12-17 LAB — HEPATIC FUNCTION PANEL
ALT: 10 U/L (ref 0–44)
AST: 27 U/L (ref 15–41)
Albumin: 4.2 g/dL (ref 3.5–5.0)
Alkaline Phosphatase: 90 U/L (ref 38–126)
Bilirubin, Direct: <0.1 mg/dL (ref 0.0–0.2)
Total Bilirubin: 0.5 mg/dL (ref 0.3–1.2)
Total Protein: 7.4 g/dL (ref 6.5–8.1)

## 2020-12-17 LAB — BASIC METABOLIC PANEL
Anion gap: 8 (ref 5–15)
BUN: 13 mg/dL (ref 8–23)
CO2: 26 mmol/L (ref 22–32)
Calcium: 9.4 mg/dL (ref 8.9–10.3)
Chloride: 105 mmol/L (ref 98–111)
Creatinine, Ser: 0.74 mg/dL (ref 0.44–1.00)
GFR, Estimated: >60 mL/min (ref >60)
Glucose, Bld: 116 mg/dL — ABNORMAL HIGH (ref 70–99)
Potassium: 3.6 mmol/L (ref 3.5–5.1)
Sodium: 139 mmol/L (ref 135–145)

## 2020-12-17 LAB — CBC
HCT: 44 % (ref 36.0–46.0)
Hemoglobin: 13.3 g/dL (ref 12.0–15.0)
MCH: 25.8 pg — ABNORMAL LOW (ref 26.0–34.0)
MCHC: 30.2 g/dL (ref 30.0–36.0)
MCV: 85.4 fL (ref 80.0–100.0)
Platelets: 297 10*3/uL (ref 150–400)
RBC: 5.15 MIL/uL — ABNORMAL HIGH (ref 3.87–5.11)
RDW: 14.1 % (ref 11.5–15.5)
WBC: 9.3 10*3/uL (ref 4.0–10.5)
nRBC: 0 % (ref 0.0–0.2)

## 2020-12-17 LAB — TROPONIN I (HIGH SENSITIVITY)
Troponin I (High Sensitivity): 5 ng/L (ref <18)
Troponin I (High Sensitivity): 6 ng/L (ref <18)

## 2020-12-17 LAB — LIPASE, BLOOD: Lipase: 35 U/L (ref 11–51)

## 2020-12-17 MED ORDER — ALUM & MAG HYDROXIDE-SIMETH 200-200-20 MG/5ML PO SUSP
30.0000 mL | Freq: Once | ORAL | Status: AC
  Administered 2020-12-17: 30 mL via ORAL
  Filled 2020-12-17: qty 30

## 2020-12-17 MED ORDER — PANTOPRAZOLE SODIUM 40 MG IV SOLR
40.0000 mg | Freq: Once | INTRAVENOUS | Status: AC
  Administered 2020-12-17: 40 mg via INTRAVENOUS
  Filled 2020-12-17: qty 40

## 2020-12-17 MED ORDER — ONDANSETRON 4 MG PO TBDP: 4.0000 mg | ORAL_TABLET | Freq: Three times a day (TID) | ORAL | 0 refills | Status: DC | PRN

## 2020-12-17 MED ORDER — ONDANSETRON HCL 4 MG/2ML IJ SOLN
2.0000 mg | Freq: Once | INTRAMUSCULAR | Status: AC
  Administered 2020-12-17: 2 mg via INTRAVENOUS
  Filled 2020-12-17: qty 2

## 2020-12-17 MED ORDER — LIDOCAINE VISCOUS HCL 2 % MT SOLN
15.0000 mL | Freq: Once | OROMUCOSAL | Status: AC
  Administered 2020-12-17: 15 mL via ORAL
  Filled 2020-12-17: qty 15

## 2020-12-17 MED ORDER — MORPHINE SULFATE (PF) 4 MG/ML IV SOLN
4.0000 mg | Freq: Once | INTRAVENOUS | Status: AC
Start: 2020-12-17 — End: 2020-12-17
  Administered 2020-12-17: 4 mg via INTRAVENOUS
  Filled 2020-12-17: qty 1

## 2020-12-17 MED ORDER — HYDROCODONE-ACETAMINOPHEN 5-325 MG PO TABS: 1.0000 | ORAL_TABLET | Freq: Four times a day (QID) | ORAL | 0 refills | Status: DC | PRN

## 2020-12-17 MED ORDER — IOHEXOL 300 MG/ML  SOLN
100.0000 mL | Freq: Once | INTRAMUSCULAR | Status: AC | PRN
  Administered 2020-12-17: 100 mL via INTRAVENOUS

## 2020-12-17 MED ORDER — ONDANSETRON 4 MG PO TBDP
4.0000 mg | ORAL_TABLET | Freq: Once | ORAL | Status: AC | PRN
  Administered 2020-12-17: 4 mg via ORAL
  Filled 2020-12-17: qty 1

## 2020-12-17 MED ORDER — HYDROCODONE-ACETAMINOPHEN 5-325 MG PO TABS
1.0000 | ORAL_TABLET | Freq: Once | ORAL | Status: AC
Start: 2020-12-17 — End: 2020-12-17
  Administered 2020-12-17: 1 via ORAL
  Filled 2020-12-17: qty 1

## 2020-12-17 NOTE — ED Provider Notes (Signed)
Emergency Medicine Provider Triage Evaluation Note  Cassandra [name] , a 75 y.o. female  was evaluated in triage.  Pt complains of epigastric pressure along with nausea and vomiting beginning last night.  Recent diagnosis of gallstones.  Review of Systems  Positive: Abdominal discomfort, nausea, vomiting Negative: Fever, chest pain, shortness of breath  Physical Exam  BP 124/87 (BP Location: Left Arm)   Pulse (!) 59   Temp 98.6 F (37 C) (Oral)   Resp 18   SpO2 100%  Gen:   Awake, appears uncomfortable Resp:  Normal effort  MSK:   Moves extremities without difficulty  Other:  Tenderness in the epigastric and left upper quadrant regions  Medical Decision Making  Medically screening exam initiated at 2:09 PM.  Appropriate orders placed.  Olevia Perches was informed that the remainder of the evaluation will be completed by another provider, this initial triage assessment does not replace that evaluation, and the importance of remaining in the ED until their evaluation is complete.  Patient had already received nursing triage with lab orders placed and resulted.  These were reviewed.  Due to the location of the patient's pain, I added additional pertinent lab work as well as an ultrasound.   Lorayne Bender, PA-C 12/17/20 1410    Lajean Saver, MD 12/22/20 [phone]

## 2020-12-17 NOTE — ED Notes (Signed)
Got patient undressed on the monitor got patient some warm blankets patient is resting vwith call bell in reach

## 2020-12-17 NOTE — ED Notes (Signed)
Patient transported to Ultrasound 

## 2020-12-17 NOTE — Discharge Instructions (Addendum)
Your gallstones are likely contributing to your pain.  This ultimately should be managed with general surgery, call the office below to set up a follow-up appointment. Eat a low-fat diet, there is information about this in the paperwork, to help decrease risk of gallbladder pain.  For the first 24 hours, you should maintain a liquid diet, and slowly progress to a bland diet and then a full diet as your symptoms allow. In the meantime, use Zofran as needed for nausea or vomiting. Use pain medicine as needed for severe breakthrough pain.  Have caution, this make you tired or groggy.  Do not drive or operate heavy machinery while taking this medicine. Continue taking her medication as prescribed, especially your Protonix.   Follow-up with your GI doctor, Dr. Rush Landmark, for further evaluation of your stomach symptoms. Return to the emergency room if you develop high fevers, persistent vomiting, severe worsening pain, or any new, worsening, or concerning symptoms.

## 2020-12-17 NOTE — ED Triage Notes (Signed)
Patient complains of a heavy central chest pressure that started this morning when it woke up from sleep. Patient also reports nausea and dry heaves, denies diarrhea. Patient states no change in pain since waking. Patient alert, oriented, and in no apparent distress at this time.

## 2020-12-17 NOTE — ED Provider Notes (Signed)
[facility] EMERGENCY DEPARTMENT Provider Note   CSN: [id number] Arrival date & time: 12/17/20  1216     History Chief Complaint  Patient presents with   Chest Pain    Cassandra [name] is a 75 y.o. female presenting for evaluation of abd pain.   Patient states when she woke up this morning around 530 she had epigastric abdominal discomfort.  She describes it as a heaviness, like something sitting there.  She is associated nausea and dry heaves.  She has not taken anything for her symptoms.  She reports a history of stomach problems including diagnosis of gallstones and a pancreatic cyst, which is followed with Dr. Stefani Dama Roddy with GI.  She has mostly been asymptomatic from this, and this was found incidentally.  She is on Protonix, supposed to take 2 pills a day, but only takes 1 a day.  She does have a history of heartburn, but states this feels different.  She denies eating anything spicy or normal last night, did not eat late or just before bed.  She denies tobacco, alcohol, or drug use.  No previous history of cardiac problems.  Pain does not radiate anywhere including to her back or her shoulder.  She denies urinary symptoms or abnormal bowel movements.  No recent medication changes.  She denies fever, cough, shortness of breath.  Additional history obtained in chart review.  Patient with a history of fibromyalgia, GERD, asthma, OAB, pancreatic cysts, gallstones, depression, hiatal hernia, OSA, anxiety.  Most recent abdominal imaging is from January 2021 which was an MRCP/MRI abdomen which showed "Multiple layering gallstones without associated inflammatory changes. No intrahepatic or extrahepatic ductal dilatation. Scattered pancreatic cysts, measuring up to 15 mm along the posterior aspect of the pancreatic tail, grossly unchanged and likely benign. Follow-up CT or MRI abdomen with/without contrast is suggested in 2 years. Moderate hepatic steatosis."    HPI      Past Medical History:  Diagnosis Date   Anemia    Anxiety    Asthma    Back pain    BPPV (benign paroxysmal positional vertigo)    Chronic allergic conjunctivitis    Depression    Diverticulosis    Fibromyalgia    Gallbladder problem    Gallstones    GERD (gastroesophageal reflux disease)    Hiatal hernia    History of bladder stone    Iron (Fe) deficiency anemia    Joint pain    Lump in female breast    Mild persistent asthma    followed by pcp--- dr Alfonso Patten. sharma ([place] allergy/asthma)   OA (osteoarthritis)    knees, hands   OAB (overactive bladder)    OSA (obstructive sleep apnea)    per pt has not used cpap several years   Pancreatic cyst    PONV (postoperative nausea and vomiting)    RLS (restless legs syndrome)    Seasonal and perennial allergic rhinitis    SUI (stress urinary incontinence, female)    Swallowing difficulty    Vitamin B12 deficiency    Vitamin D deficiency    Wears glasses     Patient Active Problem List   Diagnosis Date Noted   LUQ pain 09/15/2020   Calculus of gallbladder without cholecystitis without obstruction 09/15/2020   Hamstring tendinitis 09/10/2020   Pyrosis 07/21/2020   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   Right hip pain 06/12/2020   Pre-diabetes 03/12/2020   Seasonal allergies 03/12/2020   Obesity 10/30/2019   Low  back pain 09/11/2019   Degenerative joint disease of knee, left 09/11/2019   Pancreatic cyst 01/25/2019   Gastroesophageal reflux disease 01/25/2019   Esophageal dysphagia 01/25/2019   Unintentional weight loss 01/25/2019   IGT (impaired glucose tolerance) 01/17/2019   BPPV (benign paroxysmal positional vertigo) 10/23/2012   Vertigo 10/21/2012   UTI (lower urinary tract infection) 10/21/2012   Nausea & vomiting 10/21/2012   Postoperative anemia due to acute blood loss 09/14/2012   OA (osteoarthritis) of knee 09/12/2012   Insomnia 08/20/2011   OBSTRUCTIVE SLEEP APNEA 04/23/2008    RESTLESS LEG SYNDROME 04/23/2008   FIBROMYALGIA 01/06/2007    Past Surgical History:  Procedure Laterality Date   BLADDER SUSPENSION  1990's   sling   bladder tacking  2010   with mesh   BREAST LUMPECTOMY Left 1985   Benign cyst   BREAST REDUCTION SURGERY Bilateral 2003 approx.   CORNEA LESION EXCISION Right 2005  approx.   CYSTOSCOPY N/A 07/12/2016   Procedure: CYSTOSCOPY, VAGINOSCOPY, EXAM UNDER ANESTHESIA, STONE LITHOTRIPSY,;  Surgeon: Cleon Gustin, MD;  Location: [facility];  Service: Urology;  Laterality: N/A;   CYSTOSCOPY N/A 10/11/2016   Procedure: CYSTOSCOPY;  Surgeon: Cleon Gustin, MD;  Location: [facility];  Service: Urology;  Laterality: N/A;   CYSTOSCOPY WITH LITHOLAPAXY N/A 10/11/2016   Procedure: CYSTOSCOPY WITH LITHOLAPAXY/ EXCISION OF MESH;  Surgeon: Cleon Gustin, MD;  Location: [facility];  Service: Urology;  Laterality: N/A;   CYSTOSCOPY WITH LITHOLAPAXY N/A 01/22/2019   Procedure: CYSTOSCOPY WITH LITHOLAPAXY;  Surgeon: Cleon Gustin, MD;  Location: [facility];  Service: Urology;  Laterality: N/A;  1 HR   HOLMIUM LASER APPLICATION  02/12/7828   Procedure: HOLMIUM LASER APPLICATION;  Surgeon: Cleon Gustin, MD;  Location: [facility];  Service: Urology;;   HOLMIUM LASER APPLICATION N/A 5/62/1308   Procedure: HOLMIUM LASER APPLICATION;  Surgeon: Cleon Gustin, MD;  Location: [facility];  Service: Urology;  Laterality: N/A;   RIGHT KNEE ARTHROSCOPY  2013   TOTAL KNEE ARTHROPLASTY Right 09/12/2012   Procedure: RIGHT TOTAL KNEE ARTHROPLASTY;  Surgeon: Gearlean Alf, MD;  Location: [facility];  Service: Orthopedics;  Laterality: Right;   VAGINAL HYSTERECTOMY  1984     OB History     Gravida  2   Para      Term      Preterm      AB      Living  2      SAB      IAB      Ectopic      Multiple      Live Births               Family History  Problem Relation Age of Onset   Kidney cancer Mother    Diabetes Mother    Obesity Mother    Alcoholism Father    Colon cancer Neg Hx    Inflammatory bowel disease Neg Hx    Liver disease Neg Hx    Pancreatic cancer Neg Hx    Stomach cancer Neg Hx    Rectal cancer Neg Hx     Social History   Tobacco Use   Smoking status: Never   Smokeless tobacco: Never  Vaping Use   Vaping Use: Never used  Substance Use Topics   Alcohol use: Not Currently   Drug use: Never    Home Medications Prior to  Admission medications   Medication Sig Start Date End Date Taking? Authorizing Provider  HYDROcodone-acetaminophen (NORCO/VICODIN) 5-325 MG tablet Take 1 tablet by mouth every 6 (six) hours as needed. 12/17/20  Yes , , PA-C  ondansetron (ZOFRAN ODT) 4 MG disintegrating tablet Take 1 tablet (4 mg total) by mouth every 8 (eight) hours as needed for nausea or vomiting. 12/17/20  Yes , , PA-C  EPINEPHrine 0.3 mg/0.3 mL IJ SOAJ injection Inject 0.3 mg into the muscle as needed for anaphylaxis.    [provider]  fluticasone (FLONASE) 50 MCG/ACT nasal spray Place into both nostrils. 01/17/20   [provider]  gabapentin (NEURONTIN) 100 MG capsule TAKE 2 CAPSULES BY MOUTH AT BEDTIME 11/27/20   Lyndal Pulley, DO  levocetirizine (XYZAL) 5 MG tablet Take 5 mg by mouth every evening.  04/07/18   [provider]  metFORMIN (GLUCOPHAGE) 500 MG tablet Take 1 tablet (500 mg total) by mouth 2 (two) times daily with a meal. 11/05/20   Starlyn Skeans, MD  Misc Natural Products [facility] ADVANCED) CAPS Take 1 capsule by mouth. 1200mg  capsule    [provider]  omeprazole (PRILOSEC) 20 MG capsule Take 20 mg by mouth 2 (two) times daily before a meal.    [provider]  psyllium (REGULOID) 0.52 g capsule Take 0.52 g by mouth daily.    [provider]  spironolactone (ALDACTONE) 25 MG tablet TAKE 1 TABLET BY  MOUTH EVERY DAY 11/27/20   Isaac Bliss, Rayford Halsted, MD    Allergies    Shellfish allergy, Cephalosporins, Hydrochlorothiazide, Penicillins, and Sulfa antibiotics  Review of Systems   Review of Systems  Gastrointestinal:  Positive for abdominal pain, nausea and vomiting.  All other systems reviewed and are negative.  Physical Exam Updated Vital Signs BP (!) 164/74   Pulse 70   Temp 98.6 F (37 C) (Oral)   Resp (!) 21   Ht 5\' 5"  (1.651 m)   Wt 91.6 kg   SpO2 98%   BMI 33.60 kg/m   Physical Exam Vitals and nursing note reviewed.  Constitutional:      General: She is not in acute distress.    Appearance: Normal appearance.     Comments: Appears nontoxic  HENT:     Head: Normocephalic and atraumatic.  Eyes:     Conjunctiva/sclera: Conjunctivae normal.     Pupils: Pupils are equal, round, and reactive to light.  Cardiovascular:     Rate and Rhythm: Normal rate and regular rhythm.     Pulses: Normal pulses.  Pulmonary:     Effort: Pulmonary effort is normal. No respiratory distress.     Breath sounds: Normal breath sounds. No wheezing.     Comments: Speaking in full sentences.  Clear lung sounds in all fields. Abdominal:     General: There is no distension.     Palpations: Abdomen is soft. There is no mass.     Tenderness: There is abdominal tenderness. There is no guarding or rebound.     Comments: Tenderness palpation of epigastric and right upper quadrant abdomen with a positive Murphy sign.  No rigidity or distention.  Negative rebound.  No CVA tenderness.  Musculoskeletal:        General: Normal range of motion.     Cervical back: Normal range of motion and neck supple.  Skin:    General: Skin is warm and dry.     Capillary Refill: Capillary refill takes less than 2 seconds.  Neurological:     Mental Status: She is alert and oriented to person, place, and time.  Psychiatric:        Mood and Affect: Mood and affect normal.        Speech: Speech normal.         Behavior: Behavior normal.    ED Results / Procedures / Treatments   Labs (all labs ordered are listed, but only abnormal results are displayed) Labs Reviewed  BASIC METABOLIC PANEL - Abnormal; Notable for the following components:      Result Value   Glucose, Bld 116 (*)    All other components within normal limits  CBC - Abnormal; Notable for the following components:   RBC 5.15 (*)    MCH 25.8 (*)    All other components within normal limits  HEPATIC FUNCTION PANEL  LIPASE, BLOOD  TROPONIN I (HIGH SENSITIVITY)  TROPONIN I (HIGH SENSITIVITY)    EKG EKG Interpretation  Date/Time:  Wednesday December 17 2020 12:20:03 EDT Ventricular Rate:  62 PR Interval:  160 QRS Duration: 86 QT Interval:  410 QTC Calculation: 416 R Axis:   22 Text Interpretation: Normal sinus rhythm Normal ECG Confirmed by Lennice Sites ([id number]) on 12/17/2020 3:31:57 PM  Radiology DG Chest 2 View  Result Date: 12/17/2020 CLINICAL DATA:  Chest pain. EXAM: CHEST - 2 VIEW COMPARISON:  CT chest 10/29/2018.  Chest x-ray 09/13/2017. FINDINGS: Mediastinum and hilar structures normal. Heart size normal. No focal infiltrate. No pleural effusion or pneumothorax. Degenerative change thoracic spine. IMPRESSION: No acute cardiopulmonary disease. Electronically Signed   By: Marcello Moores  Register   On: 12/17/2020 13:27   CT ABDOMEN PELVIS W CONTRAST  Result Date: 12/17/2020 CLINICAL DATA:  Nausea vomiting abdominal pain. Cholelithiasis on ultrasound EXAM: CT ABDOMEN AND PELVIS WITH CONTRAST TECHNIQUE: Multidetector CT imaging of the abdomen and pelvis was performed using the standard protocol following bolus administration of intravenous contrast. CONTRAST:  159mL OMNIPAQUE IOHEXOL 300 MG/ML  SOLN COMPARISON:  Ultrasound 12/17/2020 FINDINGS: Lower chest:  Lung bases are clear. Hepatobiliary: No focal hepatic lesion. No gallbladder distension or inflammation. Cholesterol stone measuring 12 mm node is towards the neck of the  gallbladder. Common bile duct normal caliber. Pancreas: No pancreatic duct dilatation. Fat density lesion in the uncinate of the pancreas (image 31/3). In the genu of the pancreas, there is a low-density lesion measuring 7 mm (image 27/3). Common bile duct is normal caliber through the head of the pancreas. No pancreatic duct dilatation. No atrophy of the pancreas. Spleen: Normal spleen. Adrenals/urinary tract: Adrenal glands and kidneys are normal. Stomach/Bowel: Small hiatal hernia. Stomach, small bowel, appendix, and cecum are normal. The colon and rectosigmoid colon are normal. Vascular/Lymphatic: Abdominal aortic normal caliber. No retroperitoneal periportal lymphadenopathy. Musculoskeletal: No aggressive osseous lesion IMPRESSION: 1. Small gallstone without evidence of cholecystitis. No biliary duct dilatation. Common bile duct normal caliber. 2. Small hypodense lesion in the head of the pancreas cannot be fully characterized. Recommend MRI without contrast for further evaluation. 3. Additional fatty lesion in the uncinate of the pancreas appears benign. Electronically Signed   By: Suzy Bouchard M.D.   On: 12/17/2020 17:33   US Abdomen Limited RUQ (LIVER/GB)  Result Date: 12/17/2020 CLINICAL DATA:  Right upper quadrant tenderness. EXAM: ULTRASOUND ABDOMEN LIMITED RIGHT UPPER QUADRANT COMPARISON:  MRI abdomen 06/13/2019 FINDINGS: Gallbladder: Multiple calcified gallstones noted within the gallbladder lumen the gallbladder lumen. No wall thickening or pericholecystic visualized. No sonographic Murphy sign noted by sonographer. Common bile duct: Diameter:  3 mm. Liver: No focal lesion identified. Increased parenchymal echogenicity. Portal vein is patent on color Doppler imaging with normal direction of blood flow towards the liver. Other: None. IMPRESSION: 1. Cholelithiasis with no findings of acute cholecystitis or choledocholithiasis. 2. Hepatic steatosis. Please note limited evaluation for focal hepatic  masses in a patient with hepatic steatosis due to decreased penetration of the acoustic ultrasound waves. Electronically Signed   By: Iven Finn M.D.   On: 12/17/2020 15:34    Procedures Procedures   Medications Ordered in ED Medications  ondansetron (ZOFRAN-ODT) disintegrating tablet 4 mg (4 mg Oral Given 12/17/20 1406)  morphine 4 MG/ML injection 4 mg (4 mg Intravenous Given 12/17/20 1638)  pantoprazole (PROTONIX) injection 40 mg (40 mg Intravenous Given 12/17/20 1635)  ondansetron (ZOFRAN) injection 2 mg (2 mg Intravenous Given 12/17/20 1637)  iohexol (OMNIPAQUE) 300 MG/ML solution 100 mL (100 mLs Intravenous Contrast Given 12/17/20 1700)  alum & mag hydroxide-simeth (MAALOX/MYLANTA) 200-200-20 MG/5ML suspension 30 mL (30 mLs Oral Given 12/17/20 1810)    And  lidocaine (XYLOCAINE) 2 % viscous mouth solution 15 mL (15 mLs Oral Given 12/17/20 1810)  HYDROcodone-acetaminophen (NORCO/VICODIN) 5-325 MG per tablet 1 tablet (1 tablet Oral Given 12/17/20 1810)    ED Course  I have reviewed the triage vital signs and the nursing notes.  Pertinent labs & imaging results that were available during my care of the patient were reviewed by me and considered in my medical decision making (see chart for details).    MDM Rules/Calculators/A&P                           Patient presented for evaluation of epigastric/substernal pain.  On exam, patient appears nontoxic.  Pain is reproducible with palpation of the abdomen.  Favor GI cause as opposed to cardiac cause considering patient's risk factor and history.  However considering her age, troponins ordered x2.  EKG obtained from triage is nonischemic.  Chest x-ray viewed and independently interpreted by me, no pneumonia pneumothorax and effusion.  Ultrasound obtained from triage shows gallstones without associated gallbladder wall thickening.  LFTs and lipase are pending.  Due to patient's history, she will need CT abdomen pelvis for further evaluation of  the pancreas.  Will treat symptomatically and reassess.  On reevaluation, nausea is improved. she continues to have discomfort.  Ultrasound shows gallstones without signs of infection or duct dilation.  CT abdomen pelvis shows known pancreatic lesions.  Also shows gallstones without infection.  No other acute abnormality.  Discussed findings with patient and patient sister-in-law.  Patient continues to have mild discomfort, however she is no longer having nausea or vomiting, and states she feels well enough to go home.  I discussed continued symptomatic management and follow-up with GI and general surgery.  At this time, patient appears safe for discharge.  Return precautions given.  Patient states she understands and agrees to plan.   Final Clinical Impression(s) / ED Diagnoses Final diagnoses:  RUQ abdominal tenderness  Gall stones  Pancreatic lesion    Rx / DC Orders ED Discharge Orders          Ordered    ondansetron (ZOFRAN ODT) 4 MG disintegrating tablet  Every 8 hours PRN        12/17/20 1839    HYDROcodone-acetaminophen (NORCO/VICODIN) 5-325 MG tablet  Every 6 hours PRN        12/17/20 1839  Franchot Heidelberg, PA-C 12/17/20 [place], [place], DO 12/17/20 1850

## 2020-12-17 NOTE — ED Provider Notes (Signed)
I personally evaluated the patient during the encounter and completed a history, physical, procedures, medical decision making to contribute to the overall care of the patient and decision making for the patient briefly, the patient is a 75 y.o. female epigastric abdominal pain.  History of reflux, gallstones.  Pain started this morning.  Has had some nausea and vomiting.  Tender mostly in the epigastric region on exam.  Cardiac work-up was also initiated in the triage process that is unremarkable.  Sinus rhythm.  Normal troponins.  No concern for ACS.  Pain seems abdominal.  Gallbladder ultrasound showed gallstones but no evidence of acute cholecystitis.  CT scan of the abdomen pelvis also was unremarkable.  Lab work showed no significant anemia, electrolyte abnormality, kidney injury.  CT scan continues to show mass on the pancreas.  She is already aware of this and follows with GI.  Overall pain improved with GI cocktail/Protonix/nausea meds.  Overall suspect biliary colic versus gastritis.  No evidence of acute infectious process at this time.  She is feeling improved.  We will have her followed up with general surgery to discuss possible elective removal of gallbladder.  Understands return precautions and discharged in ED in good condition.  This chart was dictated using voice recognition software.  Despite best efforts to proofread,  errors can occur which can change the documentation meaning.    EKG Interpretation  Date/Time:  Wednesday December 17 2020 12:20:03 EDT Ventricular Rate:  62 PR Interval:  160 QRS Duration: 86 QT Interval:  410 QTC Calculation: 416 R Axis:   22 Text Interpretation: Normal sinus rhythm Normal ECG Confirmed by Ronnald Nian,  ([id number]) on 12/17/2020 3:31:57 PM            Lennice Sites, DO 12/17/20 1754

## 2020-12-24 ENCOUNTER — Encounter (HOSPITAL_BASED_OUTPATIENT_CLINIC_OR_DEPARTMENT_OTHER): Payer: Self-pay | Admitting: Physical Therapy

## 2020-12-25 NOTE — Progress Notes (Signed)
Chief Complaint:   OBESITY Cassandra [name] is here to discuss her progress with her obesity treatment plan along with follow-up of her obesity related diagnoses.   Today's visit was #: 15 Starting weight: 193 lbs Starting date: 11/13/2019 Today's weight: 202 lbs Today's date: 12/15/2020 Weight change since last visit: +1 lb Total lbs lost to date: 0 Body mass index is 33.61 kg/m.   Interim History: This is my fist office visit with Cassandra [name]. She had been seen by Cassandra [name] in recent past (last office visit) and is upset that she is not following up with her again. She wishes to only work with one provider and Cassandra. Juleen [name] was recommended to her. She is unable to eat all foods and "she know's she needs to". She had journaled in the past which helped her stay on plan. She is here to review labs recently done.  Current Meal Plan: practicing portion control and making smarter food choices, such as increasing vegetables and decreasing simple carbohydrates for 0% of the time.  Current Exercise Plan: Walking for 30-40 minutes 5-7 times per week.  Assessment/Plan:   1. Pre-diabetes Improving. Goal is HgbA1c < 5.7.  Medication: metformin 500 mg twice daily. Her A1C has improved. Per Cassandra [name], her PAP told her it was okay to stop Metformin and she did so recently. No side effects or issues per Cassandra [name]. Discussed labs with patient today.   Plan: Discussed benefits of metformin in regards to carbohydrate cravings and hunger and she still wishes to come off the medicaiton despite our discussion. She will continue to focus on protein-rich, low simple carbohydrate foods. We reviewed the importance of hydration, regular exercise for stress reduction, and restorative sleep.   Lab Results  Component Value Date   HGBA1C 5.6 04/17/2020   Lab Results  Component Value Date   INSULIN 19.1 11/26/2020   INSULIN 15.9 04/17/2020   INSULIN 30.1 (H) 11/13/2019   2. Vitamin D deficiency Improving, but not optimized.  Cassandra [name] stopped taking her Vitamin D supplement with her previous Vitamin D being elevated. Discussed labs with patient today.   Plan: Labs essentially at goal. Follow-up for routine testing of Vitamin D, at least 2-3 times per year to avoid over-replacement. Okay to take multivitamin with 1,000 IU of Vitamin D daily.  Lab Results  Component Value Date   VD25OH 45.1 11/26/2020   VD25OH 127.0 (H) 04/17/2020   VD25OH 65.2 11/13/2019   3. Other iron deficiency anemia Cassandra [name] reports no symptoms or concerns today.  Counseling Iron is essential for our bodies to make red blood cells.  Reasons that someone may be deficient include: an iron-deficient diet (more likely in those following vegan or vegetarian diets), women with heavy menses, patients with GI disorders or poor absorption, patients that have had bariatric surgery, frequent blood donors, patients with cancer, and patients with heart disease.   Iron-rich foods include dark leafy greens, red and white meats, eggs, seafood, and beans.   Certain foods and drinks prevent your body from absorbing iron properly. Avoid eating these foods in the same meal as iron-rich foods or with iron supplements. These foods include: coffee, black tea, and red wine; milk, dairy products, and foods that are high in calcium; beans and soybeans; whole grains.  Constipation can be a side effect of iron supplementation. Increased water and fiber intake are helpful. Water goal: > 2 liters/day. Fiber goal: > 25 grams/day.   Plan: No anemia show on labs and are improved from last  lab check. Stable despite not being on a supplement. Continue current treatment plan and focus on iron rich foods.  CBC Latest Ref Rng & Units 12/17/2020 11/26/2020 11/13/2019  WBC 4.0 - 10.5 K/uL 9.3 6.7 7.7  Hemoglobin 12.0 - 15.0 g/dL 13.3 13.5 14.5  Hematocrit 36.0 - 46.0 % 44.0 42.4 47.4(H)  Platelets 150 - 400 K/uL 297 266 283   Lab Results  Component Value Date   IRON 48 11/26/2020    TIBC 448 11/26/2020   FERRITIN 20 11/26/2020   Lab Results  Component Value Date   VITAMINB12 507 11/26/2020    4. At risk for deficient intake of food Cassandra [name] was given extensive education and counseling today of more than 9 minutes on risks associated with deficient food intake.  Counseled her on the importance of following our prescribed meal plan and eating adequate amounts of protein.  Discussed with Cassandra [name] that inadequate food intake over longer periods of time can slow their metabolism down significantly.   5. Obesity with current BMI 33.6 Course: Cassandra [name] is currently in the action stage of change. As such, her goal is to continue with weight loss efforts.   Nutrition goals: She has agreed to change plan to keeping a food journal and adhering to recommended goals of 1000-1200 calories and 75+ grams of protein.   Exercise goals: As is.  Behavioral modification strategies: increasing lean protein intake, decreasing simple carbohydrates, no skipping meals, and keeping a strict food journal.  Cassandra [name] has agreed to follow-up with our clinic in 4 weeks. She was informed of the importance of frequent follow-up visits to maximize her success with intensive lifestyle modifications for her multiple health conditions.   Objective:   Blood pressure 121/74, [name] 73, temperature 98.9 F (37.2 C), height '5\' 5"'$  (1.651 m), weight 202 lb (91.6 kg), SpO2 98 %. Body mass index is 33.61 kg/m.  General: Cooperative, alert, well developed, in no acute distress. HEENT: Conjunctivae and lids unremarkable. Cardiovascular: Regular rhythm.  Lungs: Normal work of breathing. Neurologic: No focal deficits.   Lab Results  Component Value Date   CREATININE 0.74 12/17/2020   BUN 13 12/17/2020   NA 139 12/17/2020   K 3.6 12/17/2020   CL 105 12/17/2020   CO2 26 12/17/2020   Lab Results  Component Value Date   ALT 10 12/17/2020   AST 27 12/17/2020   ALKPHOS 90 12/17/2020   BILITOT 0.5 12/17/2020    Lab Results  Component Value Date   HGBA1C 5.6 04/17/2020   HGBA1C 5.7 (H) 11/13/2019   HGBA1C 5.4 08/29/2019   HGBA1C 6.1 01/17/2019   HGBA1C 4.9 01/06/2007   Lab Results  Component Value Date   INSULIN 19.1 11/26/2020   INSULIN 15.9 04/17/2020   INSULIN 30.1 (H) 11/13/2019   Lab Results  Component Value Date   TSH 1.590 11/26/2020   Lab Results  Component Value Date   CHOL 173 11/26/2020   HDL 57 11/26/2020   LDLCALC 98 11/26/2020   TRIG 97 11/26/2020   CHOLHDL 3.0 11/26/2020   Lab Results  Component Value Date   VD25OH 45.1 11/26/2020   VD25OH 127.0 (H) 04/17/2020   VD25OH 65.2 11/13/2019   Lab Results  Component Value Date   WBC 9.3 12/17/2020   HGB 13.3 12/17/2020   HCT 44.0 12/17/2020   MCV 85.4 12/17/2020   PLT 297 12/17/2020   Lab Results  Component Value Date   IRON 48 11/26/2020   TIBC 448 11/26/2020  FERRITIN 20 11/26/2020   Attestation Statements:   Reviewed by clinician on day of visit: allergies, medications, problem list, medical history, surgical history, family history, social history, and previous encounter notes.  Leodis Binet Friedenbach, CMA, am acting as Location manager for [employer], DO.  I have reviewed the above documentation for accuracy and completeness, and I agree with the above. Marjory Sneddon, D.O.  The [place] was signed into law in 2016 which includes the topic of electronic health records.  This provides immediate access to information in MyChart.  This includes consultation notes, operative notes, office notes, lab results and pathology reports.  If you have any questions about what you read please let us know at your next visit so we can discuss your concerns and take corrective action if need be.  We are right here with you.

## 2020-12-26 DIAGNOSIS — J3089 Other allergic rhinitis: Secondary | ICD-10-CM | POA: Diagnosis not present

## 2020-12-26 DIAGNOSIS — J301 Allergic rhinitis due to pollen: Secondary | ICD-10-CM | POA: Diagnosis not present

## 2020-12-29 ENCOUNTER — Ambulatory Visit (HOSPITAL_BASED_OUTPATIENT_CLINIC_OR_DEPARTMENT_OTHER): Payer: PPO | Attending: Family Medicine | Admitting: Physical Therapy

## 2020-12-29 ENCOUNTER — Other Ambulatory Visit: Payer: Self-pay

## 2020-12-29 ENCOUNTER — Encounter (HOSPITAL_BASED_OUTPATIENT_CLINIC_OR_DEPARTMENT_OTHER): Payer: Self-pay | Admitting: Physical Therapy

## 2020-12-29 DIAGNOSIS — M25561 Pain in right knee: Secondary | ICD-10-CM | POA: Insufficient documentation

## 2020-12-29 DIAGNOSIS — G8929 Other chronic pain: Secondary | ICD-10-CM | POA: Insufficient documentation

## 2020-12-29 DIAGNOSIS — M25562 Pain in left knee: Secondary | ICD-10-CM | POA: Insufficient documentation

## 2020-12-29 NOTE — Therapy (Signed)
[facility] [address] [place], [place], [address] Phone: [phone]   Fax:  [phone]  Physical Therapy Treatment/Discharge  Patient Details  Name: Cassandra [name] MRN: [id number] Date of Birth: 01-Jun-1945 Referring Provider (PT): Charlann Boxer, DO   Encounter Date: 12/29/2020   PT End of Session - 12/29/20 1356     Visit Number 20    Number of Visits 23    Date for PT Re-Evaluation 01/09/21    Authorization Type [employer]    PT Start Time 1345    PT Stop Time 1423    PT Time Calculation (min) 38 min    Activity Tolerance Patient tolerated treatment well    Behavior During Therapy WFL for tasks assessed/performed             Past Medical History:  Diagnosis Date   Anemia    Anxiety    Asthma    Back pain    BPPV (benign paroxysmal positional vertigo)    Chronic allergic conjunctivitis    Depression    Diverticulosis    Fibromyalgia    Gallbladder problem    Gallstones    GERD (gastroesophageal reflux disease)    Hiatal hernia    History of bladder stone    Iron (Fe) deficiency anemia    Joint pain    Lump in female breast    Mild persistent asthma    followed by pcp--- dr Alfonso Patten. Donneta Romberg ([place] allergy/asthma)   OA (osteoarthritis)    knees, hands   OAB (overactive bladder)    OSA (obstructive sleep apnea)    per pt has not used cpap several years   Pancreatic cyst    PONV (postoperative nausea and vomiting)    RLS (restless legs syndrome)    Seasonal and perennial allergic rhinitis    SUI (stress urinary incontinence, female)    Swallowing difficulty    Vitamin B12 deficiency    Vitamin D deficiency    Wears glasses     Past Surgical History:  Procedure Laterality Date   BLADDER SUSPENSION  1990's   sling   bladder tacking  2010   with mesh   BREAST LUMPECTOMY Left 1985   Benign cyst   BREAST REDUCTION SURGERY Bilateral 2003 approx.   CORNEA LESION EXCISION Right 2005  approx.    CYSTOSCOPY N/A 07/12/2016   Procedure: CYSTOSCOPY, VAGINOSCOPY, EXAM UNDER ANESTHESIA, STONE LITHOTRIPSY,;  Surgeon: Cleon Gustin, MD;  Location: [facility];  Service: Urology;  Laterality: N/A;   CYSTOSCOPY N/A 10/11/2016   Procedure: CYSTOSCOPY;  Surgeon: Cleon Gustin, MD;  Location: [facility];  Service: Urology;  Laterality: N/A;   CYSTOSCOPY WITH LITHOLAPAXY N/A 10/11/2016   Procedure: CYSTOSCOPY WITH LITHOLAPAXY/ EXCISION OF MESH;  Surgeon: Cleon Gustin, MD;  Location: [facility];  Service: Urology;  Laterality: N/A;   CYSTOSCOPY WITH LITHOLAPAXY N/A 01/22/2019   Procedure: CYSTOSCOPY WITH LITHOLAPAXY;  Surgeon: Cleon Gustin, MD;  Location: [facility];  Service: Urology;  Laterality: N/A;  1 HR   HOLMIUM LASER APPLICATION  6/55/3748   Procedure: HOLMIUM LASER APPLICATION;  Surgeon: Cleon Gustin, MD;  Location: [facility];  Service: Urology;;   HOLMIUM LASER APPLICATION N/A 2/70/7867   Procedure: HOLMIUM LASER APPLICATION;  Surgeon: Cleon Gustin, MD;  Location: [facility];  Service: Urology;  Laterality: N/A;   RIGHT KNEE ARTHROSCOPY  2013   TOTAL KNEE ARTHROPLASTY Right  09/12/2012   Procedure: RIGHT TOTAL KNEE ARTHROPLASTY;  Surgeon: Gearlean Alf, MD;  Location: [facility];  Service: Orthopedics;  Laterality: Right;   VAGINAL HYSTERECTOMY  1984    There were no vitals filed for this visit.   Subjective Assessment - 12/29/20 1348     Subjective Thought I was having a heart attack so I went to ER. Found I was having a gallbladder attack. I did lose 8 lb. I really feel good considering. I have not had any hip pain.    Patient Stated Goals lose weight    Currently in Pain? No/denies                [facility] PT Assessment - 12/29/20 0001       Strength   Right Hip ABduction 5/5    Left Hip ABduction 5/5      Palpation   Palpation comment mild TTP  at Lt pes ansering                                   PT Education - 12/29/20 1426     Education Details goals, importance of continued HEP, nutrition counseling    Person(s) Educated Patient    Methods Explanation    Comprehension Verbalized understanding              PT Short Term Goals - 11/10/20 1231       PT SHORT TERM GOAL #1   Title neutral innominate rotation    Status Achieved               PT Long Term Goals - 12/29/20 1407       PT LONG TERM GOAL #1   Title pt will be on path to use of exercise for weight loss    Baseline lost 8 lb with recent illness    Status Partially Met      PT LONG TERM GOAL #2   Title able to sleep without "drawing up" of hamstrings    Status Achieved      PT LONG TERM GOAL #3   Title pt will demo gross LE strength 5/5    Status Achieved      PT LONG TERM GOAL #4   Title pt will verbalize feeling balanced when walking    Status Achieved      PT LONG TERM GOAL #5   Title resolution of knee edema    Status Achieved                   Plan - 12/29/20 1426     Clinical Impression Statement pt has met her goals at this time and is prepared for d/c to independent program. She is still recovering after visit to ED for diagnosed gallbladder episode and planning to see MD about this next month. Encouraged her to contact me with any further questions and to continue HEP. also provided her with the info for nutrition counseling through [facility] if she is interested.    PT Treatment/Interventions ADLs/Self Care Home Management;Moist Heat;Iontophoresis 19m/ml Dexamethasone;Electrical Stimulation;Cryotherapy;Traction;Therapeutic activities;Functional mobility training;Stair training;Gait training;Therapeutic exercise;Balance training;Neuromuscular re-education;Patient/family education;Manual techniques;Taping;Dry needling;Passive range of motion    PT Home Exercise Plan [id number], increase 3d/week by 10 min  walk, every 2 weeks incr 10 min  [id number] (walking stretches)    Consulted and Agree with Plan of Care Patient  Patient will benefit from skilled therapeutic intervention in order to improve the following deficits and impairments:  Difficulty walking, Increased muscle spasms, Improper body mechanics, Decreased activity tolerance, Impaired flexibility, Postural dysfunction, Pain, Decreased strength  Visit Diagnosis: Chronic pain of left knee  Chronic pain of right knee     Problem List Patient Active Problem List   Diagnosis Date Noted   LUQ pain 09/15/2020   Calculus of gallbladder without cholecystitis without obstruction 09/15/2020   Hamstring tendinitis 09/10/2020   Pyrosis 07/21/2020   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   Right hip pain 06/12/2020   Pre-diabetes 03/12/2020   Seasonal allergies 03/12/2020   Obesity 10/30/2019   Low back pain 09/11/2019   Degenerative joint disease of knee, left 09/11/2019   Pancreatic cyst 01/25/2019   Gastroesophageal reflux disease 01/25/2019   Esophageal dysphagia 01/25/2019   Unintentional weight loss 01/25/2019   IGT (impaired glucose tolerance) 01/17/2019   BPPV (benign paroxysmal positional vertigo) 10/23/2012   Vertigo 10/21/2012   UTI (lower urinary tract infection) 10/21/2012   Nausea & vomiting 10/21/2012   Postoperative anemia due to acute blood loss 09/14/2012   OA (osteoarthritis) of knee 09/12/2012   Insomnia 08/20/2011   OBSTRUCTIVE SLEEP APNEA 04/23/2008   RESTLESS LEG SYNDROME 04/23/2008   FIBROMYALGIA 01/06/2007   PHYSICAL THERAPY DISCHARGE SUMMARY  Visits from Start of Care: 20  Current functional level related to goals / functional outcomes: See above   Remaining deficits: See above   Education / Equipment: Anatomy of condition, POC, HEP, exercise form/rationale   Patient agrees to discharge. Patient goals were met. Patient is being discharged due to meeting the  stated rehab goals.   C.  PT, DPT 12/29/20 2:31 PM    [facility] [address] [place], [place], [address] Phone: [phone]   Fax:  [phone]  Name: Cassandra [name] MRN: [id number] Date of Birth: January 23, 1946

## 2020-12-30 DIAGNOSIS — J3089 Other allergic rhinitis: Secondary | ICD-10-CM | POA: Diagnosis not present

## 2020-12-30 DIAGNOSIS — J301 Allergic rhinitis due to pollen: Secondary | ICD-10-CM | POA: Diagnosis not present

## 2020-12-30 DIAGNOSIS — Z91030 Bee allergy status: Secondary | ICD-10-CM | POA: Diagnosis not present

## 2020-12-30 DIAGNOSIS — J4530 Mild persistent asthma, uncomplicated: Secondary | ICD-10-CM | POA: Diagnosis not present

## 2021-01-05 ENCOUNTER — Encounter: Payer: Self-pay | Admitting: Internal Medicine

## 2021-01-06 DIAGNOSIS — J301 Allergic rhinitis due to pollen: Secondary | ICD-10-CM | POA: Diagnosis not present

## 2021-01-06 DIAGNOSIS — J3089 Other allergic rhinitis: Secondary | ICD-10-CM | POA: Diagnosis not present

## 2021-01-07 ENCOUNTER — Encounter (HOSPITAL_BASED_OUTPATIENT_CLINIC_OR_DEPARTMENT_OTHER): Payer: Self-pay | Admitting: Physical Therapy

## 2021-01-12 NOTE — Progress Notes (Signed)
[name] [place] Phone: [phone] Subjective:   Cassandra [name], am serving as a scribe for Dr. Hulan Saas.  I'm seeing this patient by the request  of:  Isaac Bliss, Rayford Halsted, MD  CC: Low back pain  [id number]  11/11/2020 Also doing relatively well.  Does have the gabapentin.  Encouraged her to continue with the home exercises and can continue to focus on the weight loss.  Follow-up again in 3 months  Patient has been diagnosed with this previously.  Her last sleep study was in 2012.  Encouraged her to consider this.  Patient will write [place] if she would like referral.  Patient has been known to have sleep apnea as well but does not use her CPAP.  Patient is stable at this time.  Known to have significant arthritic changes.  Patient feels like she is doing relatively well and we will hold on the other injection.  Encouraged her to continue to stay active.  Follow-up with me again 6 to 8 weeks.  Update 01/13/2021 Cassandra [name] is a 75 y.o. female coming in with complaint of L knee and LBP. Patient states that she is doing well was doing the PT and has since stopped. Patient still doing the exercises at home and going for a mile walk every day. The only complaint is that the weather changes make her arthritis flare.        Past Medical History:  Diagnosis Date   Anemia    Anxiety    Asthma    Back pain    BPPV (benign paroxysmal positional vertigo)    Chronic allergic conjunctivitis    Depression    Diverticulosis    Fibromyalgia    Gallbladder problem    Gallstones    GERD (gastroesophageal reflux disease)    Hiatal hernia    History of bladder stone    Iron (Fe) deficiency anemia    Joint pain    Lump in female breast    Mild persistent asthma    followed by pcp--- dr Alfonso Patten. sharma ([place] allergy/asthma)   OA (osteoarthritis)    knees, hands   OAB (overactive bladder)    OSA (obstructive  sleep apnea)    per pt has not used cpap several years   Pancreatic cyst    PONV (postoperative nausea and vomiting)    RLS (restless legs syndrome)    Seasonal and perennial allergic rhinitis    SUI (stress urinary incontinence, female)    Swallowing difficulty    Vitamin B12 deficiency    Vitamin D deficiency    Wears glasses    Past Surgical History:  Procedure Laterality Date   BLADDER SUSPENSION  1990's   sling   bladder tacking  2010   with mesh   BREAST LUMPECTOMY Left 1985   Benign cyst   BREAST REDUCTION SURGERY Bilateral 2003 approx.   [name] LESION EXCISION Right 2005  approx.   CYSTOSCOPY N/A 07/12/2016   Procedure: CYSTOSCOPY, VAGINOSCOPY, EXAM UNDER ANESTHESIA, STONE LITHOTRIPSY,;  Surgeon: Cleon Gustin, MD;  Location: [facility];  Service: Urology;  Laterality: N/A;   CYSTOSCOPY N/A 10/11/2016   Procedure: CYSTOSCOPY;  Surgeon: Cleon Gustin, MD;  [facility];  Service: Urology;  Laterality: N/A;   CYSTOSCOPY WITH LITHOLAPAXY N/A 10/11/2016   Procedure: CYSTOSCOPY WITH LITHOLAPAXY/ EXCISION OF MESH;  Surgeon: Cleon Gustin, MD;  Location: [facility];  Service: Urology;  Laterality: N/A;   CYSTOSCOPY WITH LITHOLAPAXY N/A 01/22/2019   Procedure: CYSTOSCOPY WITH LITHOLAPAXY;  Surgeon: Cleon Gustin, MD;  Location: [facility];  Service: Urology;  Laterality: N/A;  1 HR   HOLMIUM LASER APPLICATION  [id number]   Procedure: HOLMIUM LASER APPLICATION;  Surgeon: Cleon Gustin, MD;  Location: [facility];  Service: Urology;;   HOLMIUM LASER APPLICATION N/A 0000000   Procedure: HOLMIUM LASER APPLICATION;  Surgeon: Cleon Gustin, MD;  Location: [facility];  Service: Urology;  Laterality: N/A;   RIGHT KNEE ARTHROSCOPY  2013   TOTAL KNEE ARTHROPLASTY Right 09/12/2012   Procedure: RIGHT TOTAL KNEE ARTHROPLASTY;  Surgeon: Gearlean Alf, MD;   Location: [facility];  Service: Orthopedics;  Laterality: Right;   VAGINAL HYSTERECTOMY  1984   Social History   Socioeconomic History   Marital status: Widowed    Spouse name: Not on file   Number of children: Not on file   Years of education: Not on file   Highest education level: Not on file  Occupational History   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  Tobacco Use   Smoking status: Never   Smokeless tobacco: Never  Vaping Use   Vaping Use: Never used  Substance and Sexual Activity   Alcohol use: Not Currently   Drug use: Never   Sexual activity: Not on file  Other Topics Concern   Not on file  Social History Narrative   Not on file   Social Determinants of Health   Financial Resource Strain: Not on file  Food Insecurity: Not on file  Transportation Needs: Not on file  Physical Activity: Not on file  Stress: Not on file  Social Connections: Not on file   Allergies  Allergen Reactions   Shellfish Allergy Hives   Cephalosporins Hives   Hydrochlorothiazide Hives   Penicillins Hives   Sulfa Antibiotics Hives   Family History  Problem Relation Age of Onset   Kidney cancer Mother    Diabetes Mother    Obesity Mother    Alcoholism Father    Colon cancer Neg Hx    Inflammatory bowel disease Neg Hx    Liver disease Neg Hx    Pancreatic cancer Neg Hx    Stomach cancer Neg Hx    Rectal cancer Neg Hx      Current Outpatient Medications (Cardiovascular):    EPINEPHrine 0.3 mg/0.3 mL IJ SOAJ injection, Inject 0.3 mg into the muscle as needed for anaphylaxis.   spironolactone (ALDACTONE) 25 MG tablet, TAKE 1 TABLET BY MOUTH EVERY DAY  Current Outpatient Medications (Respiratory):    fluticasone (FLONASE) 50 MCG/ACT nasal spray, Place into both nostrils.   levocetirizine (XYZAL) 5 MG tablet, Take 5 mg by mouth every evening.   Current Outpatient Medications (Analgesics):    HYDROcodone-acetaminophen (NORCO/VICODIN) 5-325 MG tablet, Take 1 tablet by mouth  every 6 (six) hours as needed. (Patient not taking: Reported on 01/13/2021)   Current Outpatient Medications (Other):    gabapentin (NEURONTIN) 100 MG capsule, TAKE 2 CAPSULES BY MOUTH AT BEDTIME   Misc Natural Products (TART CHERRY ADVANCED) CAPS, Take 1 capsule by mouth. '1200mg'$  capsule   omeprazole (PRILOSEC) 20 MG capsule, Take 20 mg by mouth 2 (two) times daily before a meal.   psyllium (REGULOID) 0.52 g capsule, Take 0.52 g by mouth daily.   ondansetron (ZOFRAN ODT) 4 MG disintegrating tablet, Take 1 tablet (4 mg total) by mouth every 8 (eight) hours  as needed for nausea or vomiting. (Patient not taking: Reported on 01/13/2021)   Reviewed prior external information including notes and imaging from  primary care provider As well as notes that were available from care everywhere and other healthcare systems.  Past medical history, social, surgical and family history all reviewed in electronic medical record.  No pertanent information unless stated regarding to the chief complaint.   Review of Systems:  No headache, visual changes, nausea, vomiting, diarrhea, constipation, dizziness, abdominal pain, skin rash, fevers, chills, night sweats, weight loss, swollen lymph nodes, body aches, joint swelling, chest pain, shortness of breath, mood changes. POSITIVE muscle aches  Objective  Blood pressure 110/60, pulse 83, height '5\' 5"'$  (1.651 m), weight 199 lb (90.3 kg), SpO2 96 %.   General: No apparent distress alert and oriented x3 mood and affect normal, dressed appropriately.  HEENT: Pupils equal, extraocular movements intact  Respiratory: Patient's speak in full sentences and does not appear short of breath  Cardiovascular: No lower extremity edema, non tender, no erythema  Gait normal with good balance and coordination.  MSK: Patient is able to get up from a sitting position much more easier than previously.  Patient is lost some weight since we have seen her previously as well.   Neurovascular intact distally in the lower extremities.  Still has some mild instability noted of the left knee.  Very minimal tenderness to palpation.    Impression and Recommendations:    The above documentation has been reviewed and is accurate and complete Lyndal Pulley, DO

## 2021-01-13 ENCOUNTER — Ambulatory Visit: Payer: PPO | Admitting: Family Medicine

## 2021-01-13 ENCOUNTER — Encounter (INDEPENDENT_AMBULATORY_CARE_PROVIDER_SITE_OTHER): Payer: Self-pay | Admitting: Family Medicine

## 2021-01-13 ENCOUNTER — Other Ambulatory Visit: Payer: Self-pay

## 2021-01-13 ENCOUNTER — Ambulatory Visit (INDEPENDENT_AMBULATORY_CARE_PROVIDER_SITE_OTHER): Payer: PPO | Admitting: Family Medicine

## 2021-01-13 VITALS — BP 110/60 | HR 83 | Ht 65.0 in | Wt 199.0 lb

## 2021-01-13 VITALS — BP 112/64 | HR 85 | Temp 98.4°F | Ht 65.0 in | Wt 195.0 lb

## 2021-01-13 DIAGNOSIS — K869 Disease of pancreas, unspecified: Secondary | ICD-10-CM | POA: Diagnosis not present

## 2021-01-13 DIAGNOSIS — G8929 Other chronic pain: Secondary | ICD-10-CM

## 2021-01-13 DIAGNOSIS — M545 Low back pain, unspecified: Secondary | ICD-10-CM | POA: Diagnosis not present

## 2021-01-13 DIAGNOSIS — K8020 Calculus of gallbladder without cholecystitis without obstruction: Secondary | ICD-10-CM | POA: Diagnosis not present

## 2021-01-13 DIAGNOSIS — Z6832 Body mass index (BMI) 32.0-32.9, adult: Secondary | ICD-10-CM

## 2021-01-13 DIAGNOSIS — R7303 Prediabetes: Secondary | ICD-10-CM

## 2021-01-13 DIAGNOSIS — R790 Abnormal level of blood mineral: Secondary | ICD-10-CM | POA: Diagnosis not present

## 2021-01-13 DIAGNOSIS — E669 Obesity, unspecified: Secondary | ICD-10-CM | POA: Diagnosis not present

## 2021-01-13 DIAGNOSIS — J3089 Other allergic rhinitis: Secondary | ICD-10-CM | POA: Diagnosis not present

## 2021-01-13 DIAGNOSIS — M1712 Unilateral primary osteoarthritis, left knee: Secondary | ICD-10-CM | POA: Diagnosis not present

## 2021-01-13 DIAGNOSIS — M25551 Pain in right hip: Secondary | ICD-10-CM

## 2021-01-13 DIAGNOSIS — J301 Allergic rhinitis due to pollen: Secondary | ICD-10-CM | POA: Diagnosis not present

## 2021-01-13 NOTE — Assessment & Plan Note (Signed)
Patient is doing much better after starting with physical therapy.  Patient is also lost weight but some of it is secondary to patient having recent gallstones.  Patient is awaiting seeing surgery but is stable at the moment.  Regarding her knee Patient is doing much better with no significant swelling but does have the arthritic changes which we will monitor.  No changes in management today and follow-up again in 3 months.

## 2021-01-13 NOTE — Assessment & Plan Note (Signed)
Seems to be doing relatively well as.  Continue with conservative therapy.  Working on her back as well.  Follow-up with me again in 3 months

## 2021-01-13 NOTE — Patient Instructions (Addendum)
Good to see you  You are making great strides Talk to Dr. Juleen China See me again in 3 months if you need me

## 2021-01-20 DIAGNOSIS — J301 Allergic rhinitis due to pollen: Secondary | ICD-10-CM | POA: Diagnosis not present

## 2021-01-20 DIAGNOSIS — J3089 Other allergic rhinitis: Secondary | ICD-10-CM | POA: Diagnosis not present

## 2021-01-21 DIAGNOSIS — J3081 Allergic rhinitis due to animal (cat) (dog) hair and dander: Secondary | ICD-10-CM | POA: Diagnosis not present

## 2021-01-21 DIAGNOSIS — J4530 Mild persistent asthma, uncomplicated: Secondary | ICD-10-CM | POA: Diagnosis not present

## 2021-01-21 DIAGNOSIS — J3089 Other allergic rhinitis: Secondary | ICD-10-CM | POA: Diagnosis not present

## 2021-01-22 NOTE — Progress Notes (Signed)
Chief Complaint:   OBESITY Cassandra [name] is here to discuss her progress with her obesity treatment plan along with follow-up of her obesity related diagnoses.   Today's visit was #: 21 Starting weight: 193 lbs Starting date: 11/13/2019 Today's weight: 195 lbs Today's date: 01/13/2021 Weight change since last visit: 7 lbs Total lbs lost to date: +2 lbs Body mass index is 32.45 kg/m.   Current Meal Plan: keeping a food journal and adhering to recommended goals of 1000-1200 calories and 75+ grams of protein for 80% of the time.  Current Exercise Plan: Walking for 40 minutes 6 times per week.  Interim History:  Cassandra [name] says she had a gallbladder attack.  Her ferritin is low.  Her GI doctor is Dr. Rush Landmark with [facility].  Assessment/Plan:   1. 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  2. Low ferritin Cassandra [name]'s ferritin level was 20 on 11/26/2020. Iron rich foods reviewed.  3. Other chronic pain Cassandra [name] is followed by Dr. Tamala Julian for her chronic pain.  4. Pancreatic lesion Reviewed GI note.  5. Pre-diabetes At goal. Goal is HgbA1c < 5.7.  Medication: None.    Plan:  She will continue to focus on protein-rich, low simple carbohydrate foods. We reviewed the importance of hydration, regular exercise for stress reduction, and restorative sleep.   Lab Results  Component Value Date   HGBA1C 5.6 04/17/2020   Lab Results  Component Value Date   INSULIN 19.1 11/26/2020   INSULIN 15.9 04/17/2020   INSULIN 30.1 (H) 11/13/2019   6. Obesity with current BMI 32.5  Course: Cassandra [name] is currently in the action stage of change. As such, her goal is to continue with weight loss efforts.   Nutrition goals: She has agreed to keeping a food journal and adhering to recommended goals of 1000-1200 calories and 75+ grams of protein.   Exercise goals:  As is.  Behavioral modification strategies: increasing lean protein intake, decreasing simple  carbohydrates, and increasing vegetables.  Cassandra [name] has agreed to follow-up with our clinic in 3 weeks. She was informed of the importance of frequent follow-up visits to maximize her success with intensive lifestyle modifications for her multiple health conditions.   Objective:   Blood pressure 112/64, [name] 85, temperature 98.4 F (36.9 C), height '5\' 5"'$  (1.651 m), weight 195 lb (88.5 kg), SpO2 97 %. Body mass index is 32.45 kg/m.  General: Cooperative, alert, well developed, in no acute distress. HEENT: Conjunctivae and lids unremarkable. Cardiovascular: Regular rhythm.  Lungs: Normal work of breathing. Neurologic: No focal deficits.   Lab Results  Component Value Date   CREATININE 0.74 12/17/2020   BUN 13 12/17/2020   NA 139 12/17/2020   K 3.6 12/17/2020   CL 105 12/17/2020   CO2 26 12/17/2020   Lab Results  Component Value Date   ALT 10 12/17/2020   AST 27 12/17/2020   ALKPHOS 90 12/17/2020   BILITOT 0.5 12/17/2020   Lab Results  Component Value Date   HGBA1C 5.6 04/17/2020   HGBA1C 5.7 (H) 11/13/2019   HGBA1C 5.4 08/29/2019   HGBA1C 6.1 01/17/2019   HGBA1C 4.9 01/06/2007   Lab Results  Component Value Date   INSULIN 19.1 11/26/2020   INSULIN 15.9 04/17/2020   INSULIN 30.1 (H) 11/13/2019   Lab Results  Component Value Date   TSH 1.590 11/26/2020   Lab Results  Component Value Date   CHOL 173 11/26/2020   HDL 57 11/26/2020   LDLCALC  98 11/26/2020   TRIG 97 11/26/2020   CHOLHDL 3.0 11/26/2020   Lab Results  Component Value Date   VD25OH 45.1 11/26/2020   VD25OH 127.0 (H) 04/17/2020   VD25OH 65.2 11/13/2019   Lab Results  Component Value Date   WBC 9.3 12/17/2020   HGB 13.3 12/17/2020   HCT 44.0 12/17/2020   MCV 85.4 12/17/2020   PLT 297 12/17/2020   Lab Results  Component Value Date   IRON 48 11/26/2020   TIBC 448 11/26/2020   FERRITIN 20 11/26/2020   Obesity Behavioral Intervention:   Approximately 15 minutes were spent on the  discussion below.  ASK: We discussed the diagnosis of obesity with Cassandra [name] today and Cassandra [name] agreed to give [place] permission to discuss obesity behavioral modification therapy today.  ASSESS: [place] has the diagnosis of obesity and her BMI today is 32.5. Cassandra [name] is in the action stage of change.   ADVISE: Cassandra [name] was educated on the multiple health risks of obesity as well as the benefit of weight loss to improve her health. She was advised of the need for long term treatment and the importance of lifestyle modifications to improve her current health and to decrease her risk of future health problems.  AGREE: Multiple dietary modification options and treatment options were discussed and Cassandra [name] agreed to follow the recommendations documented in the above note.  ARRANGE: Cassandra [name] was educated on the importance of frequent visits to treat obesity as outlined per CMS and USPSTF guidelines and agreed to schedule her next follow up appointment today.  Attestation Statements:   Reviewed by clinician on day of visit: allergies, medications, problem list, medical history, surgical history, family history, social history, and previous encounter notes.  I, Water quality scientist, CMA, am acting as transcriptionist for Briscoe Deutscher, DO  I have reviewed the above documentation for accuracy and completeness, and I agree with the above. Briscoe Deutscher, DO

## 2021-01-26 DIAGNOSIS — Z124 Encounter for screening for malignant neoplasm of cervix: Secondary | ICD-10-CM | POA: Diagnosis not present

## 2021-01-26 DIAGNOSIS — N952 Postmenopausal atrophic vaginitis: Secondary | ICD-10-CM | POA: Diagnosis not present

## 2021-01-26 DIAGNOSIS — Z6832 Body mass index (BMI) 32.0-32.9, adult: Secondary | ICD-10-CM | POA: Diagnosis not present

## 2021-01-26 DIAGNOSIS — Z1272 Encounter for screening for malignant neoplasm of vagina: Secondary | ICD-10-CM | POA: Diagnosis not present

## 2021-01-27 DIAGNOSIS — J301 Allergic rhinitis due to pollen: Secondary | ICD-10-CM | POA: Diagnosis not present

## 2021-01-27 DIAGNOSIS — J3089 Other allergic rhinitis: Secondary | ICD-10-CM | POA: Diagnosis not present

## 2021-01-29 ENCOUNTER — Encounter: Payer: Self-pay | Admitting: Gastroenterology

## 2021-01-29 ENCOUNTER — Ambulatory Visit: Payer: PPO | Admitting: Gastroenterology

## 2021-01-29 VITALS — BP 118/64 | HR 64 | Ht 65.5 in | Wt 200.0 lb

## 2021-01-29 DIAGNOSIS — K869 Disease of pancreas, unspecified: Secondary | ICD-10-CM | POA: Diagnosis not present

## 2021-01-29 DIAGNOSIS — K8080 Other cholelithiasis without obstruction: Secondary | ICD-10-CM

## 2021-01-29 DIAGNOSIS — R1011 Right upper quadrant pain: Secondary | ICD-10-CM

## 2021-01-29 DIAGNOSIS — D490 Neoplasm of unspecified behavior of digestive system: Secondary | ICD-10-CM | POA: Diagnosis not present

## 2021-01-29 NOTE — Patient Instructions (Signed)
You have been scheduled for an MRI at [facility]  on 02/11/21. Your appointment time is 10:00am. Please arrive to admitting (at main entrance of the hospital) 30 minutes prior to your appointment time for registration purposes. Please make certain not to have anything to eat or drink 6 hours prior to your test. In addition, if you have any metal in your body, have a pacemaker or defibrillator, please be sure to let your ordering physician know. This test typically takes 45 minutes to 1 hour to complete. Should you need to reschedule, please call [phone] to do so.  You have been referred to [facility]- Michaelle Birks MD. Make certain to bring a list of current medications, including any over the counter medications or vitamins. Also bring your co-pay if you have one as well as your insurance cards. [facility] is located at [address]. If you have not heard from their office in 1 week, please contact them at [phone].  If you are age 7 or older, your body mass index should be between 23-30. Your Body mass index is 32.78 kg/m. If this is out of the aforementioned range listed, please consider follow up with your Primary Care Provider.  If you are age 60 or younger, your body mass index should be between 19-25. Your Body mass index is 32.78 kg/m. If this is out of the aformentioned range listed, please consider follow up with your Primary Care Provider.   __________________________________________________________  The [facility] providers would like to encourage you to use [facility] to communicate with providers for non-urgent requests or questions.  Due to long hold times on the telephone, sending your provider a message by [facility] may be a faster and more efficient way to get a response.  Please allow 48 business hours for a response.  Please remember that this is for non-urgent requests.   Thank you for choosing me and [facility].  Dr. Rush Landmark

## 2021-01-29 NOTE — Progress Notes (Signed)
[place] VISIT   Primary Care Provider Isaac Bliss, Rayford Halsted, MD [place] [address] [phone]  Patient Profile: Cassandra [name] is a 75 y.o. female with a pmh significant for fibromyalgia, generalized anxiety, bladder stones, asthma, sleep apnea, BPPV, restless leg syndrome, GERD, hiatal hernia, cholelithiasis, diverticulosis, pancreatic cyst (BD-IPMN).  The patient presents to the [facility] for an evaluation and management of problem(s) noted below:  Problem List 1. RUQ pain   2. Biliary calculus of other site without obstruction   3. Pancreatic lesion   4. IPMN (intraductal papillary mucinous neoplasm)     History of Present Illness Please see initial consultation note and prior progress notes for full details of HPI.    Interval History Today, the patient returns for follow-up.  In July she presented to the emergency room with a change in her or chronic abdominal discomforts.  She had midepigastric and right upper quadrant abdominal pain.  She underwent laboratories an imaging.  She has a known history of cholelithiasis and this was confirmed once again on ultrasound imaging.  CT abdomen/pelvis was performed and there was benign appearing lesion of the pancreatic uncinate and a lesion in the neck region of the pancreas.  These are different locations than her prior branch duct IPMN's that have been noted MRI back in 2020 and 2021.  The patient was told she needed her gallbladder removed but was discharged without a general surgery referral and told to follow-up with her PCP and GI physician.  She is here for that today.  She has been watching her food intake as she has also been on her weight loss journey.  She is trying not to eat foods that are fatty.  She is scared about having another attack.  She actually missed her granddaughters wedding recently as a result of being concerned that she could have issues  recur.  The right upper quadrant and midepigastric discomfort is occurring 15 to 20 minutes after eating and then resolves with time.  She still has occasional episodes of left upper quadrant discomfort and GERD.  We have not performed repeat upper endoscopic evaluation or EGD with ultrasound to evaluate the pancreas as she was on her weight loss journey.  Our previous plan was for 2023 repeat MRI abdomen to reevaluate the pancreas cyst.  She remains on PPI therapy.    GI Review of Systems Positive as above Negative for odynophagia, dysphagia, nausea, alteration of bowel habits, melena, hematochezia   Review of Systems General: Denies fevers/chills Cardiovascular: Denies chest pain Pulmonary: Denies shortness of breath Gastroenterological: See HPI Genitourinary: Denies darkened urine Hematological: Denies easy bruising/bleeding Dermatological: Denies jaundice Psychological: Mood is stable   Medications Current Outpatient Medications  Medication Sig Dispense Refill   EPINEPHrine 0.3 mg/0.3 mL IJ SOAJ injection Inject 0.3 mg into the muscle as needed for anaphylaxis.     fluticasone (FLONASE) 50 MCG/ACT nasal spray Place into both nostrils.     gabapentin (NEURONTIN) 100 MG capsule TAKE 2 CAPSULES BY MOUTH AT BEDTIME 180 capsule 0   levocetirizine (XYZAL) 5 MG tablet Take 5 mg by mouth every evening.   1   Misc Natural Products (TART CHERRY ADVANCED) CAPS Take 1 capsule by mouth. '1200mg'$  capsule     omeprazole (PRILOSEC) 20 MG capsule Take 20 mg by mouth 2 (two) times daily before a meal.     psyllium (REGULOID) 0.52 g capsule Take 0.52 g by mouth daily.  spironolactone (ALDACTONE) 25 MG tablet TAKE 1 TABLET BY MOUTH EVERY DAY 90 tablet 1   No current facility-administered medications for this visit.    Allergies Allergies  Allergen Reactions   Shellfish Allergy Hives   Cephalosporins Hives   Hydrochlorothiazide Hives   Penicillins Hives   Sulfa Antibiotics Hives     Histories Past Medical History:  Diagnosis Date   Anemia    Anxiety    Asthma    Back pain    BPPV (benign paroxysmal positional vertigo)    Chronic allergic conjunctivitis    Depression    Diverticulosis    Fibromyalgia    Gallbladder problem    Gallstones    GERD (gastroesophageal reflux disease)    Hiatal hernia    History of bladder stone    Iron (Fe) deficiency anemia    Joint pain    Lump in female breast    Mild persistent asthma    followed by pcp--- dr Alfonso Patten. sharma ( allergy/asthma)   OA (osteoarthritis)    knees, hands   OAB (overactive bladder)    OSA (obstructive sleep apnea)    per pt has not used cpap several years   Pancreatic cyst    PONV (postoperative nausea and vomiting)    RLS (restless legs syndrome)    Seasonal and perennial allergic rhinitis    SUI (stress urinary incontinence, female)    Swallowing difficulty    Vitamin B12 deficiency    Vitamin D deficiency    Wears glasses    Past Surgical History:  Procedure Laterality Date   BLADDER SUSPENSION  1990's   sling   bladder tacking  2010   with mesh   BREAST LUMPECTOMY Left 1985   Benign cyst   BREAST REDUCTION SURGERY Bilateral 2003 approx.   CORNEA LESION EXCISION Right 2005  approx.   CYSTOSCOPY N/A 07/12/2016   Procedure: CYSTOSCOPY, VAGINOSCOPY, EXAM UNDER ANESTHESIA, STONE LITHOTRIPSY,;  Surgeon: Cleon Gustin, MD;  Location: [facility];  Service: Urology;  Laterality: N/A;   CYSTOSCOPY N/A 10/11/2016   Procedure: CYSTOSCOPY;  Surgeon: Cleon Gustin, MD;  Location[facility];  Service: Urology;  Laterality: N/A;   CYSTOSCOPY WITH LITHOLAPAXY N/A 10/11/2016   Procedure: CYSTOSCOPY WITH LITHOLAPAXY/ EXCISION OF MESH;  Surgeon: Cleon Gustin, MD;  Location: [facility];  Service: Urology;  Laterality: N/A;   CYSTOSCOPY WITH LITHOLAPAXY N/A 01/22/2019   Procedure: CYSTOSCOPY WITH LITHOLAPAXY;  Surgeon: Cleon Gustin, MD;  Location: [facility];  Service: Urology;  Laterality: N/A;  1 HR   HOLMIUM LASER APPLICATION  [id number]   Procedure: HOLMIUM LASER APPLICATION;  Surgeon: Cleon Gustin, MD;  Location: [facility];  Service: Urology;;   HOLMIUM LASER APPLICATION N/A 0000000   Procedure: HOLMIUM LASER APPLICATION;  Surgeon: Cleon Gustin, MD;  Location: [facility];  Service: Urology;  Laterality: N/A;   RIGHT KNEE ARTHROSCOPY  2013   TOTAL KNEE ARTHROPLASTY Right 09/12/2012   Procedure: RIGHT TOTAL KNEE ARTHROPLASTY;  Surgeon: Gearlean Alf, MD;  Location: [facility];  Service: Orthopedics;  Laterality: Right;   VAGINAL HYSTERECTOMY  1984   Social History   Socioeconomic History   Marital status: Widowed    Spouse name: Not on file   Number of children: Not on file   Years of education: Not on file   Highest education level: Not on file  Occupational History   Occupation: Biochemist, clinical  Retired  Tobacco Use   Smoking status: Never   Smokeless tobacco: Never  Vaping Use   Vaping Use: Never used  Substance and Sexual Activity   Alcohol use: Not Currently   Drug use: Never   Sexual activity: Not on file  Other Topics Concern   Not on file  Social History Narrative   Not on file   Social Determinants of Health   Financial Resource Strain: Not on file  Food Insecurity: Not on file  Transportation Needs: Not on file  Physical Activity: Not on file  Stress: Not on file  Social Connections: Not on file  Intimate Partner Violence: Not on file   Family History  Problem Relation Age of Onset   Kidney cancer Mother    Diabetes Mother    Obesity Mother    Alcoholism Father    Colon cancer Neg Hx    Inflammatory bowel disease Neg Hx    Liver disease Neg Hx    Pancreatic cancer Neg Hx    Stomach cancer Neg Hx    Rectal cancer Neg Hx    I have reviewed her medical, social, and family history in detail and  updated the electronic medical record as necessary.    PHYSICAL EXAMINATION  BP 118/64   Pulse 64   Ht 5' 5.5" (1.664 m)   Wt 200 lb (90.7 kg)   BMI 32.78 kg/m  Wt Readings from Last 3 Encounters:  01/29/21 200 lb (90.7 kg)  01/13/21 195 lb (88.5 kg)  01/13/21 199 lb (90.3 kg)  GEN: NAD, appears stated age, doesn't appear chronically ill PSYCH: Cooperative, without pressured speech EYE: Conjunctivae pink, sclerae anicteric ENT: MMM CV: Nontachycardic RESP: No audible wheezing GI: NABS, soft, protuberant, nontender, without rebound or guarding MSK/EXT: No lower extremity edema SKIN: No jaundice NEURO:  Alert & Oriented x 3, no focal deficits   REVIEW OF DATA  I reviewed the following data at the time of this encounter:  GI Procedures and Studies  Previously reviewed  Laboratory Studies  Reviewed those in epic  Imaging Studies  July 2022 CT abdomen pelvis with contrast IMPRESSION: 1. Small gallstone without evidence of cholecystitis. No biliary duct dilatation. Common bile duct normal caliber. 2. Small hypodense lesion in the head of the pancreas cannot be fully characterized. Recommend MRI without contrast for further evaluation. 3. Additional fatty lesion in the uncinate of the pancreas appears benign.  July 2022 right upper quadrant ultrasound IMPRESSION: 1. Cholelithiasis with no findings of acute cholecystitis or choledocholithiasis. 2. Hepatic steatosis. Please note limited evaluation for focal hepatic masses in a patient with hepatic steatosis due to decreased penetration of the acoustic ultrasound waves.   ASSESSMENT  Ms. Sproull is a 75 y.o. female with a pmh significant for fibromyalgia, generalized anxiety, bladder stones, asthma, sleep apnea, BPPV, restless leg syndrome, GERD, hiatal hernia, cholelithiasis, diverticulosis, pancreatic cyst (BD-IPMN).  The patient is seen today for evaluation and management of:  1. RUQ pain   2. Biliary calculus of  other site without obstruction   3. Pancreatic lesion   4. IPMN (intraductal papillary mucinous neoplasm)    The patient is hemodynamically stable.  Clinically however, I do think she has had a change in her symptoms.  Previously we have known about her cholelithiasis but it did not seem that her symptoms of the left upper quadrant discomfort were truly related to this.  That is why we did not refer her for cholecystectomy.  With that being said she  now has midepigastric and right upper quadrant discomfort that is occurring at times after eating.  I believe she now has symptomatic cholelithiasis.  We will place a referral for consideration of cholecystectomy.  She would like someone who has significant experience in the pancreas and bile duct area and so I am going to refer her to Dr. Barry Dienes and Dr. Zenia Resides so that they may have an opportunity to meet her and talk with her about the role of gallbladder resection and what I would now believe is some evidence of symptomatic cholelithiasis.  Due to the pancreatic lesion on the CT scan being noted I am going to review her MRI abdomen earlier than planned and will proceed with MRI abdomen to evaluate the new pancreatic lesions noted.  All patient questions were answered to the best of my ability, and the patient agrees to the aforementioned plan of action with follow-up as indicated.   PLAN  Referral to Dr. Alexis Goodell. Zenia Resides for consideration of cholecystectomy in now potentially symptomatic cholelithiasis MRI abdomen/MRCP to evaluate the pancreatic lesions that were noted on recent CT, most likely will end up being BD-IPMNs as previously noted back in 2021 and 2020 but will want to be sure Continue PPI at current dosing Manometry/repeat dilation on hold for now Colonoscopy in 2025  Maintain a low-fat diet Weight loss is still recommended for patient but rapid weight loss should be minimized   Orders Placed This Encounter  Procedures   MR ABDOMEN MRCP W  WO CONTAST   Ambulatory referral to General Surgery    New Prescriptions   No medications on file   Modified Medications   No medications on file    Planned Follow Up No follow-ups on file.   Total Time in Face-to-Face and in Coordination of Care for patient including independent/personal interpretation/review of prior testing, medical history, examination, medication adjustment, communicating results with the patient directly, and documentation with the EHR is 25 minutes.   Justice Britain, MD [facility]oscopy Office # [id number]

## 2021-01-30 DIAGNOSIS — K8020 Calculus of gallbladder without cholecystitis without obstruction: Secondary | ICD-10-CM | POA: Insufficient documentation

## 2021-01-30 DIAGNOSIS — K869 Disease of pancreas, unspecified: Secondary | ICD-10-CM | POA: Insufficient documentation

## 2021-01-30 DIAGNOSIS — R1011 Right upper quadrant pain: Secondary | ICD-10-CM | POA: Insufficient documentation

## 2021-02-03 DIAGNOSIS — J3089 Other allergic rhinitis: Secondary | ICD-10-CM | POA: Diagnosis not present

## 2021-02-03 DIAGNOSIS — J301 Allergic rhinitis due to pollen: Secondary | ICD-10-CM | POA: Diagnosis not present

## 2021-02-05 ENCOUNTER — Ambulatory Visit (INDEPENDENT_AMBULATORY_CARE_PROVIDER_SITE_OTHER): Payer: PPO | Admitting: Family Medicine

## 2021-02-05 ENCOUNTER — Encounter (INDEPENDENT_AMBULATORY_CARE_PROVIDER_SITE_OTHER): Payer: Self-pay | Admitting: Family Medicine

## 2021-02-05 ENCOUNTER — Other Ambulatory Visit: Payer: Self-pay

## 2021-02-05 VITALS — BP 107/65 | HR 67 | Temp 98.1°F | Ht 65.0 in | Wt 194.0 lb

## 2021-02-05 DIAGNOSIS — K8020 Calculus of gallbladder without cholecystitis without obstruction: Secondary | ICD-10-CM | POA: Diagnosis not present

## 2021-02-05 DIAGNOSIS — Z6832 Body mass index (BMI) 32.0-32.9, adult: Secondary | ICD-10-CM

## 2021-02-05 DIAGNOSIS — E669 Obesity, unspecified: Secondary | ICD-10-CM | POA: Diagnosis not present

## 2021-02-05 DIAGNOSIS — R7303 Prediabetes: Secondary | ICD-10-CM

## 2021-02-10 NOTE — Progress Notes (Signed)
Chief Complaint:   OBESITY Cassandra [name] is here to discuss her progress with her obesity treatment plan along with follow-up of her obesity related diagnoses.   Today's visit was #: 22 Starting weight: 193 lbs Starting date: 11/13/2019 Today's weight: 194 lbs Today's date: 02/05/2021 Weight change since last visit: 1 lb Total lbs lost to date: +1 Body mass index is 32.28 kg/m.   Current Meal Plan: the Category 2 Plan and keeping a food journal and adhering to recommended goals of 1000-1200 calories and 75+ grams of protein for 85% of the time.  Current Exercise Plan: Walking 1 mile 5 times per week.  Interim History:  Cassandra [name] says she is now having RUQ pain and has been referred to General Surgery.  She has an upcoming MRI.  She is afraid to eat and trigger abdominal pain.  Assessment/Plan:   1. Calculus of gallbladder without cholecystitis without obstruction Cassandra [name] has been referred to General Surgery and is scheduled for an upcoming MRI. We will continue to monitor symptoms as they relate to her weight loss journey.  2. Pre-diabetes At goal. Goal is HgbA1c < 5.7.  Medication: None.    Plan:  She will continue to focus on protein-rich, low simple carbohydrate foods. We reviewed the importance of hydration, regular exercise for stress reduction, and restorative sleep.   Lab Results  Component Value Date   HGBA1C 5.6 04/17/2020   Lab Results  Component Value Date   INSULIN 19.1 11/26/2020   INSULIN 15.9 04/17/2020   INSULIN 30.1 (H) 11/13/2019   3. Class 1 obesity with serious comorbidity and body mass index (BMI) of 32.0 to 32.9 in adult, unspecified obesity type  Course: Cassandra [name] is currently in the action stage of change. As such, her goal is to continue with weight loss efforts.   Nutrition goals: She has agreed to the Category 2 Plan and keeping a food journal and adhering to recommended goals of 1000-1200 calories and 75+ grams of protein.   Exercise goals:  As  is.  Behavioral modification strategies: increasing lean protein intake, decreasing simple carbohydrates, increasing vegetables, and increasing water intake.  Cassandra [name] has agreed to follow-up with our clinic as needed. She was informed of the importance of frequent follow-up visits to maximize her success with intensive lifestyle modifications for her multiple health conditions.   Objective:   Blood pressure 107/65, [name] 67, temperature 98.1 F (36.7 C), temperature source Oral, height '5\' 5"'$  (1.651 m), weight 194 lb (88 kg), SpO2 97 %. Body mass index is 32.28 kg/m.  General: Cooperative, alert, well developed, in no acute distress. HEENT: Conjunctivae and lids unremarkable. Cardiovascular: Regular rhythm.  Lungs: Normal work of breathing. Neurologic: No focal deficits.   Lab Results  Component Value Date   CREATININE 0.74 12/17/2020   BUN 13 12/17/2020   NA 139 12/17/2020   K 3.6 12/17/2020   CL 105 12/17/2020   CO2 26 12/17/2020   Lab Results  Component Value Date   ALT 10 12/17/2020   AST 27 12/17/2020   ALKPHOS 90 12/17/2020   BILITOT 0.5 12/17/2020   Lab Results  Component Value Date   HGBA1C 5.6 04/17/2020   HGBA1C 5.7 (H) 11/13/2019   HGBA1C 5.4 08/29/2019   HGBA1C 6.1 01/17/2019   HGBA1C 4.9 01/06/2007   Lab Results  Component Value Date   INSULIN 19.1 11/26/2020   INSULIN 15.9 04/17/2020   INSULIN 30.1 (H) 11/13/2019   Lab Results  Component Value Date   TSH  1.590 11/26/2020   Lab Results  Component Value Date   CHOL 173 11/26/2020   HDL 57 11/26/2020   LDLCALC 98 11/26/2020   TRIG 97 11/26/2020   CHOLHDL 3.0 11/26/2020   Lab Results  Component Value Date   VD25OH 45.1 11/26/2020   VD25OH 127.0 (H) 04/17/2020   VD25OH 65.2 11/13/2019   Lab Results  Component Value Date   WBC 9.3 12/17/2020   HGB 13.3 12/17/2020   HCT 44.0 12/17/2020   MCV 85.4 12/17/2020   PLT 297 12/17/2020   Lab Results  Component Value Date   IRON 48 11/26/2020    TIBC 448 11/26/2020   FERRITIN 20 11/26/2020   Obesity Behavioral Intervention:   Approximately 15 minutes were spent on the discussion below.  ASK: We discussed the diagnosis of obesity with Cassandra [name] today and Cassandra [name] agreed to give [place] permission to discuss obesity behavioral modification therapy today.  ASSESS: Cassandra [name] has the diagnosis of obesity and her BMI today is 32.3. Khamari is in the action stage of change.   ADVISE: Cassandra [name] was educated on the multiple health risks of obesity as well as the benefit of weight loss to improve her health. She was advised of the need for long term treatment and the importance of lifestyle modifications to improve her current health and to decrease her risk of future health problems.  AGREE: Multiple dietary modification options and treatment options were discussed and Cassandra [name] agreed to follow the recommendations documented in the above note.  ARRANGE: Cassandra [name] was educated on the importance of frequent visits to treat obesity as outlined per CMS and USPSTF guidelines and agreed to schedule her next follow up appointment today.  Attestation Statements:   Reviewed by clinician on day of visit: allergies, medications, problem list, medical history, surgical history, family history, social history, and previous encounter notes.  I, Water quality scientist, CMA, am acting as transcriptionist for Briscoe Deutscher, DO  I have reviewed the above documentation for accuracy and completeness, and I agree with the above. Briscoe Deutscher, DO

## 2021-02-11 ENCOUNTER — Other Ambulatory Visit: Payer: Self-pay

## 2021-02-11 ENCOUNTER — Ambulatory Visit (HOSPITAL_COMMUNITY)
Admission: RE | Admit: 2021-02-11 | Discharge: 2021-02-11 | Disposition: A | Discharge status: Home / Self Care | Payer: PPO | Source: Ambulatory Visit | Attending: Gastroenterology | Admitting: Gastroenterology

## 2021-02-11 ENCOUNTER — Other Ambulatory Visit: Payer: Self-pay | Admitting: Gastroenterology

## 2021-02-11 DIAGNOSIS — D490 Neoplasm of unspecified behavior of digestive system: Secondary | ICD-10-CM

## 2021-02-11 DIAGNOSIS — K869 Disease of pancreas, unspecified: Secondary | ICD-10-CM

## 2021-02-11 DIAGNOSIS — R1011 Right upper quadrant pain: Secondary | ICD-10-CM | POA: Diagnosis not present

## 2021-02-11 DIAGNOSIS — K862 Cyst of pancreas: Secondary | ICD-10-CM | POA: Diagnosis not present

## 2021-02-11 DIAGNOSIS — J3089 Other allergic rhinitis: Secondary | ICD-10-CM | POA: Diagnosis not present

## 2021-02-11 DIAGNOSIS — K8080 Other cholelithiasis without obstruction: Secondary | ICD-10-CM | POA: Insufficient documentation

## 2021-02-11 DIAGNOSIS — K8020 Calculus of gallbladder without cholecystitis without obstruction: Secondary | ICD-10-CM | POA: Diagnosis not present

## 2021-02-11 DIAGNOSIS — J301 Allergic rhinitis due to pollen: Secondary | ICD-10-CM | POA: Diagnosis not present

## 2021-02-11 DIAGNOSIS — R935 Abnormal findings on diagnostic imaging of other abdominal regions, including retroperitoneum: Secondary | ICD-10-CM | POA: Diagnosis not present

## 2021-02-11 DIAGNOSIS — K760 Fatty (change of) liver, not elsewhere classified: Secondary | ICD-10-CM | POA: Diagnosis not present

## 2021-02-11 MED ORDER — GADOBUTROL 1 MMOL/ML IV SOLN
8.8000 mL | Freq: Once | INTRAVENOUS | Status: AC | PRN
  Administered 2021-02-11: 8.8 mL via INTRAVENOUS

## 2021-02-12 DIAGNOSIS — J3089 Other allergic rhinitis: Secondary | ICD-10-CM | POA: Diagnosis not present

## 2021-02-12 DIAGNOSIS — J301 Allergic rhinitis due to pollen: Secondary | ICD-10-CM | POA: Diagnosis not present

## 2021-02-18 DIAGNOSIS — J3089 Other allergic rhinitis: Secondary | ICD-10-CM | POA: Diagnosis not present

## 2021-02-18 DIAGNOSIS — J301 Allergic rhinitis due to pollen: Secondary | ICD-10-CM | POA: Diagnosis not present

## 2021-02-24 DIAGNOSIS — J301 Allergic rhinitis due to pollen: Secondary | ICD-10-CM | POA: Diagnosis not present

## 2021-02-24 DIAGNOSIS — J3089 Other allergic rhinitis: Secondary | ICD-10-CM | POA: Diagnosis not present

## 2021-02-25 ENCOUNTER — Encounter: Payer: Self-pay | Admitting: Internal Medicine

## 2021-03-02 DIAGNOSIS — K8020 Calculus of gallbladder without cholecystitis without obstruction: Secondary | ICD-10-CM | POA: Diagnosis not present

## 2021-03-02 DIAGNOSIS — K862 Cyst of pancreas: Secondary | ICD-10-CM | POA: Diagnosis not present

## 2021-03-03 ENCOUNTER — Other Ambulatory Visit: Payer: Self-pay | Admitting: Family Medicine

## 2021-03-06 DIAGNOSIS — J301 Allergic rhinitis due to pollen: Secondary | ICD-10-CM | POA: Diagnosis not present

## 2021-03-06 DIAGNOSIS — J3089 Other allergic rhinitis: Secondary | ICD-10-CM | POA: Diagnosis not present

## 2021-03-10 DIAGNOSIS — J301 Allergic rhinitis due to pollen: Secondary | ICD-10-CM | POA: Diagnosis not present

## 2021-03-10 DIAGNOSIS — J3089 Other allergic rhinitis: Secondary | ICD-10-CM | POA: Diagnosis not present

## 2021-03-17 DIAGNOSIS — J301 Allergic rhinitis due to pollen: Secondary | ICD-10-CM | POA: Diagnosis not present

## 2021-03-17 DIAGNOSIS — J3089 Other allergic rhinitis: Secondary | ICD-10-CM | POA: Diagnosis not present

## 2021-03-19 ENCOUNTER — Other Ambulatory Visit: Payer: Self-pay

## 2021-03-19 ENCOUNTER — Ambulatory Visit (INDEPENDENT_AMBULATORY_CARE_PROVIDER_SITE_OTHER): Payer: PPO | Admitting: Family Medicine

## 2021-03-19 ENCOUNTER — Encounter (INDEPENDENT_AMBULATORY_CARE_PROVIDER_SITE_OTHER): Payer: Self-pay | Admitting: Family Medicine

## 2021-03-19 VITALS — BP 110/68 | HR 75 | Temp 98.1°F | Ht 65.0 in | Wt 194.0 lb

## 2021-03-19 DIAGNOSIS — K8020 Calculus of gallbladder without cholecystitis without obstruction: Secondary | ICD-10-CM

## 2021-03-19 DIAGNOSIS — F3289 Other specified depressive episodes: Secondary | ICD-10-CM

## 2021-03-19 DIAGNOSIS — R7303 Prediabetes: Secondary | ICD-10-CM

## 2021-03-19 DIAGNOSIS — Z6832 Body mass index (BMI) 32.0-32.9, adult: Secondary | ICD-10-CM | POA: Diagnosis not present

## 2021-03-19 DIAGNOSIS — E669 Obesity, unspecified: Secondary | ICD-10-CM

## 2021-03-19 MED ORDER — BUPROPION HCL ER (SR) 150 MG PO TB12: 150.0000 mg | ORAL_TABLET | Freq: Every day | ORAL | 2 refills | Status: DC

## 2021-03-24 DIAGNOSIS — J3089 Other allergic rhinitis: Secondary | ICD-10-CM | POA: Diagnosis not present

## 2021-03-24 DIAGNOSIS — J301 Allergic rhinitis due to pollen: Secondary | ICD-10-CM | POA: Diagnosis not present

## 2021-03-24 NOTE — Progress Notes (Signed)
Chief Complaint:   OBESITY Cassandra [name] is here to discuss her progress with her obesity treatment plan along with follow-up of her obesity related diagnoses. See Medical Weight Management Flowsheet for complete bioelectrical impedance results.  Today's visit was #: 18 Starting weight: 193 lbs Starting date: 11/13/2019 Weight change since last visit: 0 Total lbs lost to date: +1  Nutrition Plan: Category 2 Plan and keeping a food journal and adhering to recommended goals of 1000-1200 calories and 75+ grams of protein daily for 80% of the time.  Activity: Walking for 15-30 minutes 4-5 times per week.  Interim History:   Reviewed GI and General Surgery visits together.  Assessment/Plan:   1. Pre-diabetes At goal. Goal is HgbA1c < 5.7.  Medication: None.    Plan: She will continue to focus on protein-rich, low simple carbohydrate foods. We reviewed the importance of hydration, regular exercise for stress reduction, and restorative sleep.   Lab Results  Component Value Date   HGBA1C 5.6 04/17/2020   Lab Results  Component Value Date   INSULIN 19.1 11/26/2020   INSULIN 15.9 04/17/2020   INSULIN 30.1 (H) 11/13/2019   2. Calculus of gallbladder without cholecystitis without obstruction I have reviewed Cassandra [name]'s GI and General Surgery visit notes. She will be following up with General Surgery in 3 months. We discussed the risk of using GLPRA in this setting - risk of gallstone pancreatitis.   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 Medication: None.   Plan:  Start Wellbutrin 150 mg XL daily, as per below.  - Start buPROPion (WELLBUTRIN SR) 150 MG 12 hr tablet; Take 1 tablet (150 mg total) by mouth daily.  Dispense: 30 tablet; Refill: 2  4. Obesity, current BMI 32.4  Course: Cassandra [name] is currently in the action stage of change. As such, her goal is to continue with weight loss efforts.   Nutrition goals: She has agreed to the Category 2 Plan and keeping a food journal and adhering  to recommended goals of 1000-1200 calories and 75+ grams of protein.   Exercise goals:  As is.  Behavioral modification strategies: increasing lean protein intake, decreasing simple carbohydrates, increasing vegetables, and increasing water intake.  Cassandra [name] has agreed to follow-up with our clinic in 4 weeks. She was informed of the importance of frequent follow-up visits to maximize her success with intensive lifestyle modifications for her multiple health conditions.   Objective:   Blood pressure 110/68, pulse 75, temperature 98.1 F (36.7 C), temperature source Oral, height 5\' 5"  (1.651 m), weight 194 lb (88 kg), SpO2 97 %. Body mass index is 32.28 kg/m.  General: Cooperative, alert, well developed, in no acute distress. HEENT: Conjunctivae and lids unremarkable. Cardiovascular: Regular rhythm.  Lungs: Normal work of breathing. Neurologic: No focal deficits.   Lab Results  Component Value Date   CREATININE 0.74 12/17/2020   BUN 13 12/17/2020   NA 139 12/17/2020   K 3.6 12/17/2020   CL 105 12/17/2020   CO2 26 12/17/2020   Lab Results  Component Value Date   ALT 10 12/17/2020   AST 27 12/17/2020   ALKPHOS 90 12/17/2020   BILITOT 0.5 12/17/2020   Lab Results  Component Value Date   HGBA1C 5.6 04/17/2020   HGBA1C 5.7 (H) 11/13/2019   HGBA1C 5.4 08/29/2019   HGBA1C 6.1 01/17/2019   HGBA1C 4.9 01/06/2007   Lab Results  Component Value Date   INSULIN 19.1 11/26/2020   INSULIN 15.9 04/17/2020   INSULIN 30.1 (H) 11/13/2019  Lab Results  Component Value Date   TSH 1.590 11/26/2020   Lab Results  Component Value Date   CHOL 173 11/26/2020   HDL 57 11/26/2020   LDLCALC 98 11/26/2020   TRIG 97 11/26/2020   CHOLHDL 3.0 11/26/2020   Lab Results  Component Value Date   VD25OH 45.1 11/26/2020   VD25OH 127.0 (H) 04/17/2020   VD25OH 65.2 11/13/2019   Lab Results  Component Value Date   WBC 9.3 12/17/2020   HGB 13.3 12/17/2020   HCT 44.0 12/17/2020   MCV 85.4  12/17/2020   PLT 297 12/17/2020   Lab Results  Component Value Date   IRON 48 11/26/2020   TIBC 448 11/26/2020   FERRITIN 20 11/26/2020   Attestation Statements:   Reviewed by clinician on day of visit: allergies, medications, problem list, medical history, surgical history, family history, social history, and previous encounter notes.  I, Water quality scientist, CMA, am acting as transcriptionist for Briscoe Deutscher, DO  I have reviewed the above documentation for accuracy and completeness, and I agree with the above. -  Briscoe Deutscher, DO, MS, FAAFP, DABOM - Family and Bariatric Medicine.

## 2021-03-31 DIAGNOSIS — J301 Allergic rhinitis due to pollen: Secondary | ICD-10-CM | POA: Diagnosis not present

## 2021-03-31 DIAGNOSIS — J3089 Other allergic rhinitis: Secondary | ICD-10-CM | POA: Diagnosis not present

## 2021-04-07 DIAGNOSIS — J301 Allergic rhinitis due to pollen: Secondary | ICD-10-CM | POA: Diagnosis not present

## 2021-04-07 DIAGNOSIS — J3089 Other allergic rhinitis: Secondary | ICD-10-CM | POA: Diagnosis not present

## 2021-04-09 DIAGNOSIS — J3089 Other allergic rhinitis: Secondary | ICD-10-CM | POA: Diagnosis not present

## 2021-04-09 DIAGNOSIS — J301 Allergic rhinitis due to pollen: Secondary | ICD-10-CM | POA: Diagnosis not present

## 2021-04-10 NOTE — Progress Notes (Signed)
Cassandra [name]  [facility] [address] [place] Phone: [phone] Subjective:   IVilma [name], am serving as a scribe for Dr. Hulan Saas. This visit occurred during the SARS-CoV-2 public health emergency.  Safety protocols were in place, including screening questions prior to the visit, additional usage of staff PPE, and extensive cleaning of exam room while observing appropriate contact time as indicated for disinfecting solutions.   I'm seeing this patient by the request  of:  Cassandra [name], Cassandra Halsted, MD  CC: Back pain and knee pain follow-up  WHQ:[id number]  01/13/2021 Seems to be doing relatively well as.  Continue with conservative therapy.  Working on her back as well.  Follow-up with me again in 3 months  Patient is doing much better after starting with physical therapy.  Patient is also lost weight but some of it is secondary to patient having recent gallstones.  Patient is awaiting seeing surgery but is stable at the moment.  Regarding her knee Patient is doing much better with no significant swelling but does have the arthritic changes which we will monitor.  No changes in management today and follow-up again in 3 months.  Update 04/10/2021 Cassandra [name] is a 75 y.o. female coming in with complaint of back and R hip pain. Patient states on a new med Wellbutrin. Her back does hurt after activity, but dissipates after a while. Exercises have been helping. Knee has been swollen. Walking for a period of time does cause some agitation. Mis stepping causes more pain.      Past Medical History:  Diagnosis Date   Anemia    Anxiety    Asthma    Back pain    BPPV (benign paroxysmal positional vertigo)    Chronic allergic conjunctivitis    Depression    Diverticulosis    Fibromyalgia    Gallbladder problem    Gallstones    GERD (gastroesophageal reflux disease)    Hiatal hernia    History of bladder stone    Iron (Fe) deficiency anemia     Joint pain    Lump in female breast    Mild persistent asthma    followed by pcp--- dr Cassandra [name] ([place] allergy/asthma)   OA (osteoarthritis)    knees, hands   OAB (overactive bladder)    OSA (obstructive sleep apnea)    per pt has not used cpap several years   Pancreatic cyst    PONV (postoperative nausea and vomiting)    RLS (restless legs syndrome)    Seasonal and perennial allergic rhinitis    SUI (stress urinary incontinence, female)    Swallowing difficulty    Vitamin B12 deficiency    Vitamin D deficiency    Wears glasses    Past Surgical History:  Procedure Laterality Date   BLADDER SUSPENSION  1990's   sling   bladder tacking  2010   with mesh   BREAST LUMPECTOMY Left 1985   Benign cyst   BREAST REDUCTION SURGERY Bilateral 2003 approx.   CORNEA LESION EXCISION Right 2005  approx.   CYSTOSCOPY N/A 07/12/2016   Procedure: CYSTOSCOPY, VAGINOSCOPY, EXAM UNDER ANESTHESIA, STONE LITHOTRIPSY,;  Surgeon: Cleon Gustin, MD;  Location: [facility];  Service: Urology;  Laterality: N/A;   CYSTOSCOPY N/A 10/11/2016   Procedure: CYSTOSCOPY;  Surgeon: Cleon Gustin, MD;  Location: [facility];  Service: Urology;  Laterality: N/A;   CYSTOSCOPY WITH LITHOLAPAXY N/A 10/11/2016   Procedure: CYSTOSCOPY  WITH LITHOLAPAXY/ EXCISION OF MESH;  Surgeon: Cleon Gustin, MD;  Location: [facility];  Service: Urology;  Laterality: N/A;   CYSTOSCOPY WITH LITHOLAPAXY N/A 01/22/2019   Procedure: CYSTOSCOPY WITH LITHOLAPAXY;  Surgeon: Cleon Gustin, MD;  Location: [facility];  Service: Urology;  Laterality: N/A;  1 HR   HOLMIUM LASER APPLICATION  1/61/0960   Procedure: HOLMIUM LASER APPLICATION;  Surgeon: Cleon Gustin, MD;  Location: [facility];  Service: Urology;;   HOLMIUM LASER APPLICATION N/A 4/54/0981   Procedure: HOLMIUM LASER APPLICATION;  Surgeon: Cleon Gustin, MD;  Location:  [facility];  Service: Urology;  Laterality: N/A;   RIGHT KNEE ARTHROSCOPY  2013   TOTAL KNEE ARTHROPLASTY Right 09/12/2012   Procedure: RIGHT TOTAL KNEE ARTHROPLASTY;  Surgeon: Gearlean Alf, MD;  Location: [facility];  Service: Orthopedics;  Laterality: Right;   VAGINAL HYSTERECTOMY  1984   Social History   Socioeconomic History   Marital status: Widowed    Spouse name: Not on file   Number of children: Not on file   Years of education: Not on file   Highest education level: Not on file  Occupational History   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  Tobacco Use   Smoking status: Never   Smokeless tobacco: Never  Vaping Use   Vaping Use: Never used  Substance and Sexual Activity   Alcohol use: Not Currently   Drug use: Never   Sexual activity: Not on file  Other Topics Concern   Not on file  Social History Narrative   Not on file   Social Determinants of Health   Financial Resource Strain: Not on file  Food Insecurity: Not on file  Transportation Needs: Not on file  Physical Activity: Not on file  Stress: Not on file  Social Connections: Not on file   Allergies  Allergen Reactions   Shellfish Allergy Hives   Cephalosporins Hives   Hydrochlorothiazide Hives   Penicillins Hives   Sulfa Antibiotics Hives   Family History  Problem Relation Age of Onset   Kidney cancer Mother    Diabetes Mother    Obesity Mother    Alcoholism Father    Colon cancer Neg Hx    Inflammatory bowel disease Neg Hx    Liver disease Neg Hx    Pancreatic cancer Neg Hx    Stomach cancer Neg Hx    Rectal cancer Neg Hx      Current Outpatient Medications (Cardiovascular):    EPINEPHrine 0.3 mg/0.3 mL IJ SOAJ injection, Inject 0.3 mg into the muscle as needed for anaphylaxis.   spironolactone (ALDACTONE) 25 MG tablet, TAKE 1 TABLET BY MOUTH EVERY DAY  Current Outpatient Medications (Respiratory):    fluticasone (FLONASE) 50 MCG/ACT nasal spray, Place into both  nostrils.   levocetirizine (XYZAL) 5 MG tablet, Take 5 mg by mouth every evening.     Current Outpatient Medications (Other):    buPROPion (WELLBUTRIN SR) 150 MG 12 hr tablet, Take 1 tablet (150 mg total) by mouth daily.   gabapentin (NEURONTIN) 100 MG capsule, TAKE 2 CAPSULES BY MOUTH AT BEDTIME   Misc Natural Products (TART CHERRY ADVANCED) CAPS, Take 1 capsule by mouth. 1200mg  capsule   omeprazole (PRILOSEC) 20 MG capsule, Take 20 mg by mouth 2 (two) times daily before a meal.   psyllium (REGULOID) 0.52 g capsule, Take 0.52 g by mouth daily.    Objective  Blood pressure 132/72, pulse 82, height 5\' 5"  (  1.651 m), weight 195 lb (88.5 kg), SpO2 97 %.   General: No apparent distress alert and oriented x3 mood and affect normal, dressed appropriately.  Patient has lost some weight since we have seen her. HEENT: Pupils equal, extraocular movements intact  Respiratory: Patient's speak in full sentences and does not appear short of breath  Cardiovascular: No lower extremity edema, non tender, no erythema  Gait normal with good balance and coordination.  MSK: Left knee exam does have some arthritic changes noted.  Patient has no tenderness to palpation over the medial joint line.  Mild discomfort noted with valgus and varus force.  Mild crepitus with range of motion.   Impression and Recommendations:    The above documentation has been reviewed and is accurate and complete Lyndal Pulley, DO

## 2021-04-11 ENCOUNTER — Other Ambulatory Visit (INDEPENDENT_AMBULATORY_CARE_PROVIDER_SITE_OTHER): Payer: Self-pay | Admitting: Family Medicine

## 2021-04-11 DIAGNOSIS — F3289 Other specified depressive episodes: Secondary | ICD-10-CM

## 2021-04-13 NOTE — Telephone Encounter (Signed)
Pt last seen by Dr. Wallace.  

## 2021-04-14 ENCOUNTER — Other Ambulatory Visit: Payer: Self-pay

## 2021-04-14 ENCOUNTER — Ambulatory Visit: Payer: PPO | Admitting: Family Medicine

## 2021-04-14 DIAGNOSIS — M1712 Unilateral primary osteoarthritis, left knee: Secondary | ICD-10-CM

## 2021-04-14 NOTE — Patient Instructions (Signed)
Good to see you!  Be proud of the strides you've made Tell Dr. Garnette Czech See you again in 3 months

## 2021-04-14 NOTE — Assessment & Plan Note (Signed)
Patient is stable at the moment.  Patient does not want any type of injection.  We will continue to monitor.  Patient is losing weight and I think that that would be the most beneficial thing she can do.  Increase activity as tolerated and follow-up with me in 2 to 3 months.

## 2021-04-15 DIAGNOSIS — J301 Allergic rhinitis due to pollen: Secondary | ICD-10-CM | POA: Diagnosis not present

## 2021-04-15 DIAGNOSIS — J3089 Other allergic rhinitis: Secondary | ICD-10-CM | POA: Diagnosis not present

## 2021-04-17 DIAGNOSIS — J3089 Other allergic rhinitis: Secondary | ICD-10-CM | POA: Diagnosis not present

## 2021-04-17 DIAGNOSIS — J301 Allergic rhinitis due to pollen: Secondary | ICD-10-CM | POA: Diagnosis not present

## 2021-04-21 DIAGNOSIS — J3089 Other allergic rhinitis: Secondary | ICD-10-CM | POA: Diagnosis not present

## 2021-04-21 DIAGNOSIS — J301 Allergic rhinitis due to pollen: Secondary | ICD-10-CM | POA: Diagnosis not present

## 2021-04-28 ENCOUNTER — Other Ambulatory Visit: Payer: Self-pay

## 2021-04-28 ENCOUNTER — Encounter (INDEPENDENT_AMBULATORY_CARE_PROVIDER_SITE_OTHER): Payer: Self-pay | Admitting: Family Medicine

## 2021-04-28 ENCOUNTER — Ambulatory Visit (INDEPENDENT_AMBULATORY_CARE_PROVIDER_SITE_OTHER): Payer: PPO | Admitting: Family Medicine

## 2021-04-28 VITALS — BP 124/70 | HR 81 | Temp 98.2°F | Ht 65.0 in | Wt 191.0 lb

## 2021-04-28 DIAGNOSIS — H04123 Dry eye syndrome of bilateral lacrimal glands: Secondary | ICD-10-CM

## 2021-04-28 DIAGNOSIS — J301 Allergic rhinitis due to pollen: Secondary | ICD-10-CM | POA: Diagnosis not present

## 2021-04-28 DIAGNOSIS — Z6832 Body mass index (BMI) 32.0-32.9, adult: Secondary | ICD-10-CM

## 2021-04-28 DIAGNOSIS — F3289 Other specified depressive episodes: Secondary | ICD-10-CM | POA: Diagnosis not present

## 2021-04-28 DIAGNOSIS — E669 Obesity, unspecified: Secondary | ICD-10-CM

## 2021-04-28 DIAGNOSIS — J3089 Other allergic rhinitis: Secondary | ICD-10-CM | POA: Diagnosis not present

## 2021-04-28 DIAGNOSIS — F5101 Primary insomnia: Secondary | ICD-10-CM | POA: Diagnosis not present

## 2021-04-28 MED ORDER — BUPROPION HCL ER (SR) 150 MG PO TB12: 150.0000 mg | ORAL_TABLET | Freq: Every day | ORAL | 2 refills | Status: DC

## 2021-04-28 MED ORDER — TEMAZEPAM 7.5 MG PO CAPS
7.5000 mg | ORAL_CAPSULE | Freq: Every evening | ORAL | 0 refills | Status: DC | PRN
Start: 2021-04-28 — End: 2021-06-18

## 2021-04-28 MED ORDER — CYCLOSPORINE 0.05 % OP EMUL: 1.0000 [drp] | Freq: Two times a day (BID) | OPHTHALMIC | 1 refills | Status: DC

## 2021-04-29 NOTE — Progress Notes (Signed)
Chief Complaint:   OBESITY Cassandra [name] is here to discuss her progress with her obesity treatment plan along with follow-up of her obesity related diagnoses. See Medical Weight Management Flowsheet for complete bioelectrical impedance results.  Today's visit was #: 3 Starting weight: 193 lbs Starting date: 11/13/2019 Weight change since last visit: 3 lbs Total lbs lost to date: 2 lbs Total weight loss percentage to date: -1.04%  Nutrition Plan: Category 2 Plan and keeping a food journal and adhering to recommended goals of 1000-1200 calories and 75+ grams of protein daily for 85% of the time. Activity: Walking for 30 minutes 3 times per week.   Interim History: Cassandra [name] says that her reflux is worsening.  She has had some blurry vision - stopped Wellbutrin around 1 week ago.  She says she has beginning cataracts. She says that Wellbutrin helped hunger/cravings.  She endorses poor sleep.  She says she has borderline sleep apnea.  Assessment/Plan:   1. 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  Current treatment: None. After discussion, patient would like to start below medication. Expectations, risks, and potential side effects reviewed.   Plan:  Start Restoril 7.5 mg at bedtime for sleep (Ambien if not covered). Recommend sleep hygiene measures including regular sleep schedule, optimal sleep environment, and relaxing presleep rituals.   - Start temazepam (RESTORIL) 7.5 MG capsule; Take 1 capsule (7.5 mg total) by mouth at bedtime as needed for sleep.  Dispense: 30 capsule; Refill: 0  2. Dry eye syndrome of both eyes Blurry vision may be due to dry eyes. She has a history of the same. After discussion, patient would like to start below medication. Expectations, risks, and potential side effects reviewed. Cassandra [name] will start Restasis 1 drop in both eyes twice daily.  - Start cycloSPORINE (RESTASIS) 0.05 % ophthalmic emulsion; Place 1 drop into both eyes 2 (two) times daily.  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 Medication: Wellbutrin 150 mg daily (she stopped taking it about 1 week ago).   Plan:  Restart Wellbutrin 150 mg daily.  Will refill today. Discussed cues and consequences, how thoughts affect eating, model of thoughts, feelings, and behaviors, and strategies for change by focusing on the cue. Discussed cognitive distortions, coping thoughts, and how to change your thoughts.  - Refill buPROPion (WELLBUTRIN SR) 150 MG 12 hr tablet; Take 1 tablet (150 mg total) by mouth daily.  Dispense: 30 tablet; Refill: 2  4. Obesity, current BMI 31.9  Course: Cassandra [name] is currently in the action stage of change. As such, her goal is to continue with weight loss efforts.   Nutrition goals: She has agreed to the Category 2 Plan and keeping a food journal and adhering to recommended goals of 1000-1200 calories and 75+ grams of protein.   Exercise goals:  As is.  Behavioral modification strategies: increasing lean protein intake, decreasing simple carbohydrates, increasing vegetables, and increasing water intake.  Cassandra [name] has agreed to follow-up with our clinic in 4 weeks. She was informed of the importance of frequent follow-up visits to maximize her success with intensive lifestyle modifications for her multiple health conditions.   Objective:   Blood pressure 124/70, pulse 81, temperature 98.2 F (36.8 C), height 5\' 5"  (1.651 m), weight 191 lb (86.6 kg), SpO2 97 %. Body mass index is 31.78 kg/m.  General: Cooperative, alert, well developed, in no acute distress. HEENT: Conjunctivae and lids unremarkable. Cardiovascular: Regular rhythm.  Lungs: Normal work of breathing. Neurologic: No  focal deficits.   Lab Results  Component Value Date   CREATININE 0.74 12/17/2020   BUN 13 12/17/2020   NA 139 12/17/2020   K 3.6 12/17/2020   CL 105 12/17/2020   CO2 26 12/17/2020   Lab Results  Component Value Date   ALT 10 12/17/2020   AST 27 12/17/2020    ALKPHOS 90 12/17/2020   BILITOT 0.5 12/17/2020   Lab Results  Component Value Date   HGBA1C 5.6 04/17/2020   HGBA1C 5.7 (H) 11/13/2019   HGBA1C 5.4 08/29/2019   HGBA1C 6.1 01/17/2019   HGBA1C 4.9 01/06/2007   Lab Results  Component Value Date   INSULIN 19.1 11/26/2020   INSULIN 15.9 04/17/2020   INSULIN 30.1 (H) 11/13/2019   Lab Results  Component Value Date   TSH 1.590 11/26/2020   Lab Results  Component Value Date   CHOL 173 11/26/2020   HDL 57 11/26/2020   LDLCALC 98 11/26/2020   TRIG 97 11/26/2020   CHOLHDL 3.0 11/26/2020   Lab Results  Component Value Date   VD25OH 45.1 11/26/2020   VD25OH 127.0 (H) 04/17/2020   VD25OH 65.2 11/13/2019   Lab Results  Component Value Date   WBC 9.3 12/17/2020   HGB 13.3 12/17/2020   HCT 44.0 12/17/2020   MCV 85.4 12/17/2020   PLT 297 12/17/2020   Lab Results  Component Value Date   IRON 48 11/26/2020   TIBC 448 11/26/2020   FERRITIN 20 11/26/2020   Attestation Statements:   Reviewed by clinician on day of visit: allergies, medications, problem list, medical history, surgical history, family history, social history, and previous encounter notes.  Time spent on visit including pre-visit chart review and post-visit care and documentation was 42 minutes. Time was spent on: Food choices and timing of food intake reviewed today. I discussed a personalized meal plan with the patient that will help her to lose weight and will improve her obesity-related conditions going forward. I performed a medically necessary appropriate examination and/or evaluation. I discussed the assessment and treatment plan with the patient. Motivational interviewing as well as evidence-based interventions for health behavior change were utilized today including the discussion of self monitoring techniques, problem-solving barriers and SMART goal setting techniques.  An exercise prescription was reviewed.  The patient was provided an opportunity to ask  questions and all were answered. The patient agreed with the plan and demonstrated an understanding of the instructions. Clinical information was updated and documented in the EMR.  I, Water quality scientist, CMA, am acting as transcriptionist for Briscoe Deutscher, DO  I have reviewed the above documentation for accuracy and completeness, and I agree with the above. -  Briscoe Deutscher, DO, MS, FAAFP, DABOM - Family and Bariatric Medicine.

## 2021-05-05 DIAGNOSIS — J301 Allergic rhinitis due to pollen: Secondary | ICD-10-CM | POA: Diagnosis not present

## 2021-05-05 DIAGNOSIS — J3089 Other allergic rhinitis: Secondary | ICD-10-CM | POA: Diagnosis not present

## 2021-05-13 DIAGNOSIS — J3089 Other allergic rhinitis: Secondary | ICD-10-CM | POA: Diagnosis not present

## 2021-05-13 DIAGNOSIS — J301 Allergic rhinitis due to pollen: Secondary | ICD-10-CM | POA: Diagnosis not present

## 2021-05-19 DIAGNOSIS — J3089 Other allergic rhinitis: Secondary | ICD-10-CM | POA: Diagnosis not present

## 2021-05-19 DIAGNOSIS — J301 Allergic rhinitis due to pollen: Secondary | ICD-10-CM | POA: Diagnosis not present

## 2021-05-27 DIAGNOSIS — J301 Allergic rhinitis due to pollen: Secondary | ICD-10-CM | POA: Diagnosis not present

## 2021-05-27 DIAGNOSIS — J3089 Other allergic rhinitis: Secondary | ICD-10-CM | POA: Diagnosis not present

## 2021-06-03 DIAGNOSIS — J301 Allergic rhinitis due to pollen: Secondary | ICD-10-CM | POA: Diagnosis not present

## 2021-06-03 DIAGNOSIS — J3089 Other allergic rhinitis: Secondary | ICD-10-CM | POA: Diagnosis not present

## 2021-06-05 ENCOUNTER — Other Ambulatory Visit: Payer: Self-pay | Admitting: Internal Medicine

## 2021-06-07 ENCOUNTER — Encounter (INDEPENDENT_AMBULATORY_CARE_PROVIDER_SITE_OTHER): Payer: Self-pay

## 2021-06-08 ENCOUNTER — Encounter (INDEPENDENT_AMBULATORY_CARE_PROVIDER_SITE_OTHER): Payer: Self-pay | Admitting: Family Medicine

## 2021-06-08 ENCOUNTER — Other Ambulatory Visit: Payer: Self-pay | Admitting: Family Medicine

## 2021-06-08 ENCOUNTER — Other Ambulatory Visit: Payer: Self-pay

## 2021-06-08 ENCOUNTER — Ambulatory Visit (INDEPENDENT_AMBULATORY_CARE_PROVIDER_SITE_OTHER): Payer: PPO | Admitting: Family Medicine

## 2021-06-08 VITALS — BP 114/75 | HR 78 | Temp 97.9°F | Ht 65.0 in | Wt 192.0 lb

## 2021-06-08 DIAGNOSIS — K8020 Calculus of gallbladder without cholecystitis without obstruction: Secondary | ICD-10-CM

## 2021-06-08 DIAGNOSIS — F3289 Other specified depressive episodes: Secondary | ICD-10-CM

## 2021-06-08 DIAGNOSIS — F5101 Primary insomnia: Secondary | ICD-10-CM

## 2021-06-08 DIAGNOSIS — E669 Obesity, unspecified: Secondary | ICD-10-CM

## 2021-06-08 DIAGNOSIS — Z6832 Body mass index (BMI) 32.0-32.9, adult: Secondary | ICD-10-CM

## 2021-06-09 ENCOUNTER — Encounter: Payer: Self-pay | Admitting: Internal Medicine

## 2021-06-09 DIAGNOSIS — F3289 Other specified depressive episodes: Secondary | ICD-10-CM

## 2021-06-09 MED ORDER — SPIRONOLACTONE 25 MG PO TABS: 25.0000 mg | ORAL_TABLET | Freq: Every day | ORAL | 0 refills | Status: DC

## 2021-06-09 MED ORDER — BUPROPION HCL ER (SR) 200 MG PO TB12: 200.0000 mg | ORAL_TABLET | Freq: Every day | ORAL | 0 refills | Status: DC

## 2021-06-09 NOTE — Telephone Encounter (Signed)
Last OV 10/2019

## 2021-06-09 NOTE — Addendum Note (Signed)
Addended by: Elza Rafter D on: 06/09/2021 03:00 PM   Modules accepted: Orders

## 2021-06-09 NOTE — Telephone Encounter (Signed)
Last OV 10/2019, last preventative visit 2020. Per [place] medication refill protocol, unable to fill medication without PCP approval. Pt notified of above; appt scheduled for 1/17. Will send to PCP for approval.

## 2021-06-10 NOTE — Progress Notes (Signed)
Chief Complaint:   OBESITY Cassandra [name] is here to discuss her progress with her obesity treatment plan along with follow-up of her obesity related diagnoses. See Medical Weight Management Flowsheet for complete bioelectrical impedance results.  Today's visit was #: 20 Starting weight: 193 lbs Starting date: 11/13/2019 Weight change since last visit: +1 lb Total lbs lost to date: 1 lb Total weight loss percentage to date: -0.52%  Nutrition Plan: Category 2 Plan and keeping a food journal and adhering to recommended goals of 1000-1200 calories and 75+ grams of protein daily for 75-80% of the time. Activity: Walking for 15-30 minutes 3 times per week.    Interim History: Cassandra [name] says that the weather has caused her to have vertigo.  She also endorses decreased hearing.  She says that Restasis did not help her dry eyes.  She is taking Wellbutrin, but says it is not helping now.  She reports boredom eating.  For her gallbladder, she has been avoiding fat.  Assessment/Plan:   1. Primary insomnia This is improved with Restoril 7.5 mg at bedtime.    Plan:  Continue Restoril 7.5 mg at bedtime.  Recommend sleep hygiene measures including regular sleep schedule, optimal sleep environment, and relaxing presleep rituals.   2. Calculus of gallbladder without cholecystitis without obstruction Cassandra [name] will continue to avoid fatty foods. We will continue to monitor symptoms as they relate to her weight loss journey.  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 Medication: Wellbutrin 150 mg daily.   Plan:  Increase Wellbutrin to 200 mg daily. Discussed cues and consequences, how thoughts affect eating, model of thoughts, feelings, and behaviors, and strategies for change by focusing on the cue. Discussed cognitive distortions, coping thoughts, and how to change your thoughts.  - Increase buPROPion (WELLBUTRIN SR) 200 MG 12 hr tablet; Take 1 tablet (200 mg total) by mouth daily at 12 noon.  Dispense: 30  tablet; Refill: 0  4. Obesity, current BMI 32  Course: Cassandra [name] is currently in the action stage of change. As such, her goal is to continue with weight loss efforts.   Nutrition goals: She has agreed to mindful eating.   Exercise goals:  As is.  Behavioral modification strategies: increasing lean protein intake, decreasing simple carbohydrates, increasing vegetables, and increasing water intake.  Cassandra [name] has agreed to follow-up with our clinic in 4 weeks. She was informed of the importance of frequent follow-up visits to maximize her success with intensive lifestyle modifications for her multiple health conditions.   Objective:   Blood pressure 114/75, pulse 78, temperature 97.9 F (36.6 C), temperature source Oral, height 5\' 5"  (1.651 m), weight 192 lb (87.1 kg), SpO2 97 %. Body mass index is 31.95 kg/m.  General: Cooperative, alert, well developed, in no acute distress. HEENT: Conjunctivae and lids unremarkable. Cardiovascular: Regular rhythm.  Lungs: Normal work of breathing. Neurologic: No focal deficits.   Lab Results  Component Value Date   CREATININE 0.74 12/17/2020   BUN 13 12/17/2020   NA 139 12/17/2020   K 3.6 12/17/2020   CL 105 12/17/2020   CO2 26 12/17/2020   Lab Results  Component Value Date   ALT 10 12/17/2020   AST 27 12/17/2020   ALKPHOS 90 12/17/2020   BILITOT 0.5 12/17/2020   Lab Results  Component Value Date   HGBA1C 5.6 04/17/2020   HGBA1C 5.7 (H) 11/13/2019   HGBA1C 5.4 08/29/2019   HGBA1C 6.1 01/17/2019   HGBA1C 4.9 01/06/2007   Lab Results  Component Value  Date   INSULIN 19.1 11/26/2020   INSULIN 15.9 04/17/2020   INSULIN 30.1 (H) 11/13/2019   Lab Results  Component Value Date   TSH 1.590 11/26/2020   Lab Results  Component Value Date   CHOL 173 11/26/2020   HDL 57 11/26/2020   LDLCALC 98 11/26/2020   TRIG 97 11/26/2020   CHOLHDL 3.0 11/26/2020   Lab Results  Component Value Date   VD25OH 45.1 11/26/2020   VD25OH 127.0 (H)  04/17/2020   VD25OH 65.2 11/13/2019   Lab Results  Component Value Date   WBC 9.3 12/17/2020   HGB 13.3 12/17/2020   HCT 44.0 12/17/2020   MCV 85.4 12/17/2020   PLT 297 12/17/2020   Lab Results  Component Value Date   IRON 48 11/26/2020   TIBC 448 11/26/2020   FERRITIN 20 11/26/2020   Attestation Statements:   Reviewed by clinician on day of visit: allergies, medications, problem list, medical history, surgical history, family history, social history, and previous encounter notes.  I, Water quality scientist, CMA, am acting as transcriptionist for Briscoe Deutscher, DO  I have reviewed the above documentation for accuracy and completeness, and I agree with the above. -  Briscoe Deutscher, DO, MS, FAAFP, DABOM - Family and Bariatric Medicine.

## 2021-06-12 ENCOUNTER — Other Ambulatory Visit (INDEPENDENT_AMBULATORY_CARE_PROVIDER_SITE_OTHER): Payer: Self-pay | Admitting: Family Medicine

## 2021-06-12 DIAGNOSIS — F5101 Primary insomnia: Secondary | ICD-10-CM

## 2021-06-12 DIAGNOSIS — J3089 Other allergic rhinitis: Secondary | ICD-10-CM | POA: Diagnosis not present

## 2021-06-12 DIAGNOSIS — J301 Allergic rhinitis due to pollen: Secondary | ICD-10-CM | POA: Diagnosis not present

## 2021-06-15 ENCOUNTER — Encounter (INDEPENDENT_AMBULATORY_CARE_PROVIDER_SITE_OTHER): Payer: Self-pay

## 2021-06-15 NOTE — Telephone Encounter (Signed)
Msg sent to pt 

## 2021-06-16 ENCOUNTER — Ambulatory Visit (INDEPENDENT_AMBULATORY_CARE_PROVIDER_SITE_OTHER): Payer: PPO | Admitting: Internal Medicine

## 2021-06-16 ENCOUNTER — Encounter (INDEPENDENT_AMBULATORY_CARE_PROVIDER_SITE_OTHER): Payer: Self-pay | Admitting: Family Medicine

## 2021-06-16 VITALS — BP 122/70 | HR 75 | Temp 98.4°F | Resp 16 | Ht 65.0 in | Wt 195.8 lb

## 2021-06-16 DIAGNOSIS — R1319 Other dysphagia: Secondary | ICD-10-CM | POA: Diagnosis not present

## 2021-06-16 DIAGNOSIS — Z0000 Encounter for general adult medical examination without abnormal findings: Secondary | ICD-10-CM | POA: Diagnosis not present

## 2021-06-16 DIAGNOSIS — R7302 Impaired glucose tolerance (oral): Secondary | ICD-10-CM | POA: Diagnosis not present

## 2021-06-16 DIAGNOSIS — J301 Allergic rhinitis due to pollen: Secondary | ICD-10-CM | POA: Diagnosis not present

## 2021-06-16 DIAGNOSIS — F5101 Primary insomnia: Secondary | ICD-10-CM

## 2021-06-16 DIAGNOSIS — K2100 Gastro-esophageal reflux disease with esophagitis, without bleeding: Secondary | ICD-10-CM

## 2021-06-16 DIAGNOSIS — J3089 Other allergic rhinitis: Secondary | ICD-10-CM | POA: Diagnosis not present

## 2021-06-16 DIAGNOSIS — R42 Dizziness and giddiness: Secondary | ICD-10-CM | POA: Diagnosis not present

## 2021-06-16 DIAGNOSIS — H9193 Unspecified hearing loss, bilateral: Secondary | ICD-10-CM

## 2021-06-16 MED ORDER — MECLIZINE HCL 25 MG PO TABS: 25.0000 mg | ORAL_TABLET | Freq: Three times a day (TID) | ORAL | 0 refills | Status: DC | PRN

## 2021-06-16 MED ORDER — SPIRONOLACTONE 25 MG PO TABS: 25.0000 mg | ORAL_TABLET | Freq: Every day | ORAL | 1 refills | Status: DC

## 2021-06-16 NOTE — Patient Instructions (Signed)
-  Nice seeing you today!!  -Lab work today; will notify you once results are available.  -Remember to get your COVID booster at the pharmacy.  -Schedule follow up in 1 year or sooner as needed.

## 2021-06-16 NOTE — Progress Notes (Signed)
Established Patient Office Visit     This visit occurred during the SARS-CoV-2 public health emergency.  Safety protocols were in place, including screening questions prior to the visit, additional usage of staff PPE, and extensive cleaning of exam room while observing appropriate contact time as indicated for disinfecting solutions.    CC/Reason for Visit: Annual preventive exam and subsequent Medicare wellness visit  HPI: Cassandra [name] is a 76 y.o. female who is coming in today for the above mentioned reasons. Past Medical History is significant for: Obesity, GERD, vertigo and mild intermittent asthma.  She has lost a significant amount of weight and is currently attending weight and wellness.  She was recently started on Wellbutrin after a depressed mood from several recent family deaths.  She has been complaining of decreased hearing and is requesting an ENT referral.  She is overdue for COVID booster.  She takes spironolactone for vertigo chronically and needs a refill today.  She had a colonoscopy in 2015 and a mammogram in January 2022.   Past Medical/Surgical History: Past Medical History:  Diagnosis Date   Anemia    Anxiety    Asthma    Back pain    BPPV (benign paroxysmal positional vertigo)    Chronic allergic conjunctivitis    Depression    Diverticulosis    Fibromyalgia    Gallbladder problem    Gallstones    GERD (gastroesophageal reflux disease)    Hiatal hernia    History of bladder stone    Iron (Fe) deficiency anemia    Joint pain    Lump in female breast    Mild persistent asthma    followed by pcp--- dr Alfonso Patten. sharma ([place] allergy/asthma)   OA (osteoarthritis)    knees, hands   OAB (overactive bladder)    OSA (obstructive sleep apnea)    per pt has not used cpap several years   Pancreatic cyst    PONV (postoperative nausea and vomiting)    RLS (restless legs syndrome)    Seasonal and perennial allergic rhinitis    SUI (stress urinary  incontinence, female)    Swallowing difficulty    Vitamin B12 deficiency    Vitamin D deficiency    Wears glasses     Past Surgical History:  Procedure Laterality Date   BLADDER SUSPENSION  1990's   sling   bladder tacking  2010   with mesh   BREAST LUMPECTOMY Left 1985   Benign cyst   BREAST REDUCTION SURGERY Bilateral 2003 approx.   CORNEA LESION EXCISION Right 2005  approx.   CYSTOSCOPY N/A 07/12/2016   Procedure: CYSTOSCOPY, VAGINOSCOPY, EXAM UNDER ANESTHESIA, STONE LITHOTRIPSY,;  Surgeon: Cleon Gustin, MD;  Location[facility];  Service: Urology;  Laterality: N/A;   CYSTOSCOPY N/A 10/11/2016   Procedure: CYSTOSCOPY;  Surgeon: Cleon Gustin, MD;  Location: [facility];  Service: Urology;  Laterality: N/A;   CYSTOSCOPY WITH LITHOLAPAXY N/A 10/11/2016   Procedure: CYSTOSCOPY WITH LITHOLAPAXY/ EXCISION OF MESH;  Surgeon: Cleon Gustin, MD;  Location: [facility];  Service: Urology;  Laterality: N/A;   CYSTOSCOPY WITH LITHOLAPAXY N/A 01/22/2019   Procedure: CYSTOSCOPY WITH LITHOLAPAXY;  Surgeon: Cleon Gustin, MD;  Location: [facility];  Service: Urology;  Laterality: N/A;  1 HR   HOLMIUM LASER APPLICATION  01/08/9146   Procedure: HOLMIUM LASER APPLICATION;  Surgeon: Cleon Gustin, MD;  Location: [facility];  Service: Urology;;  HOLMIUM LASER APPLICATION N/A 12/01/5007   Procedure: HOLMIUM LASER APPLICATION;  Surgeon: Cleon Gustin, MD;  Location: [facility];  Service: Urology;  Laterality: N/A;   RIGHT KNEE ARTHROSCOPY  2013   TOTAL KNEE ARTHROPLASTY Right 09/12/2012   Procedure: RIGHT TOTAL KNEE ARTHROPLASTY;  Surgeon: Gearlean Alf, MD;  Location: [facility];  Service: Orthopedics;  Laterality: Right;   VAGINAL HYSTERECTOMY  1984    Social History:  reports that she has never smoked. She has never used smokeless tobacco. She reports that she does not  currently use alcohol. She reports that she does not use drugs.  Allergies: Allergies  Allergen Reactions   Shellfish Allergy Hives   Cephalosporins Hives   Codeine     Other reaction(s): Unknown   Hydrochlorothiazide Hives   Misc. Sulfonamide Containing Compounds Hives   Penicillins Hives   Sulfa Antibiotics Hives   Sulfacetamide Sodium     Other reaction(s): Unknown    Family History:  Family History  Problem Relation Age of Onset   Kidney cancer Mother    Diabetes Mother    Obesity Mother    Alcoholism Father    Colon cancer Neg Hx    Inflammatory bowel disease Neg Hx    Liver disease Neg Hx    Pancreatic cancer Neg Hx    Stomach cancer Neg Hx    Rectal cancer Neg Hx      Current Outpatient Medications:    meclizine (ANTIVERT) 25 MG tablet, Take 1 tablet (25 mg total) by mouth 3 (three) times daily as needed for dizziness., Disp: 30 tablet, Rfl: 0   buPROPion (WELLBUTRIN SR) 200 MG 12 hr tablet, Take 1 tablet (200 mg total) by mouth daily at 12 noon., Disp: 30 tablet, Rfl: 0   cycloSPORINE (RESTASIS) 0.05 % ophthalmic emulsion, Place 1 drop into both eyes 2 (two) times daily., Disp: 0.4 mL, Rfl: 1   EPINEPHrine 0.3 mg/0.3 mL IJ SOAJ injection, Inject 0.3 mg into the muscle as needed for anaphylaxis., Disp: , Rfl:    fluticasone (FLONASE) 50 MCG/ACT nasal spray, Place into both nostrils., Disp: , Rfl:    gabapentin (NEURONTIN) 100 MG capsule, TAKE 2 CAPSULES BY MOUTH AT BEDTIME, Disp: 180 capsule, Rfl: 0   levocetirizine (XYZAL) 5 MG tablet, Take 5 mg by mouth every evening. , Disp: , Rfl: 1   Misc Natural Products (TART CHERRY ADVANCED) CAPS, Take 1 capsule by mouth. 1200mg  capsule, Disp: , Rfl:    omeprazole (PRILOSEC) 20 MG capsule, Take 20 mg by mouth 2 (two) times daily before a meal., Disp: , Rfl:    psyllium (REGULOID) 0.52 g capsule, Take 0.52 g by mouth daily., Disp: , Rfl:    spironolactone (ALDACTONE) 25 MG tablet, Take 1 tablet (25 mg total) by mouth daily.,  Disp: 90 tablet, Rfl: 1   temazepam (RESTORIL) 7.5 MG capsule, Take 1 capsule (7.5 mg total) by mouth at bedtime as needed for sleep., Disp: 30 capsule, Rfl: 0  Review of Systems:  Constitutional: Denies fever, chills, diaphoresis, appetite change and fatigue.  HEENT: Denies photophobia, eye pain, redness, hearing loss, ear pain, congestion, sore throat, rhinorrhea, sneezing, mouth sores, trouble swallowing, neck pain, neck stiffness and tinnitus.   Respiratory: Denies SOB, DOE, cough, chest tightness,  and wheezing.   Cardiovascular: Denies chest pain, palpitations and leg swelling.  Gastrointestinal: Denies nausea, vomiting, abdominal pain, diarrhea, constipation, blood in stool and abdominal distention.  Genitourinary: Denies dysuria, urgency, frequency, hematuria, flank pain and  difficulty urinating.  Endocrine: Denies: hot or cold intolerance, sweats, changes in hair or nails, polyuria, polydipsia. Musculoskeletal: Denies myalgias, back pain, joint swelling, arthralgias and gait problem.  Skin: Denies pallor, rash and wound.  Neurological: Denies dizziness, seizures, syncope, weakness, light-headedness, numbness and headaches.  Hematological: Denies adenopathy. Easy bruising, personal or family bleeding history  Psychiatric/Behavioral: Denies suicidal ideation, mood changes, confusion, nervousness, sleep disturbance and agitation    Physical Exam: Vitals:   06/16/21 1414  BP: 122/70  Pulse: 75  Resp: 16  Temp: 98.4 F (36.9 C)  SpO2: 99%  Weight: 195 lb 12.8 oz (88.8 kg)  Height: 5\' 5"  (1.651 m)    Body mass index is 32.58 kg/m.   Constitutional: NAD, calm, comfortable Eyes: PERRL, lids and conjunctivae normal ENMT: Mucous membranes are moist. Posterior pharynx clear of any exudate or lesions. Normal dentition. Tympanic membrane is pearly white, no erythema or bulging. Neck: normal, supple, no masses, no thyromegaly Respiratory: clear to auscultation bilaterally, no  wheezing, no crackles. Normal respiratory effort. No accessory muscle use.  Cardiovascular: Regular rate and rhythm, no murmurs / rubs / gallops. No extremity edema. 2+ pedal pulses. No carotid bruits.  Abdomen: no tenderness, no masses palpated. No hepatosplenomegaly. Bowel sounds positive.  Musculoskeletal: no clubbing / cyanosis. No joint deformity upper and lower extremities. Good ROM, no contractures. Normal muscle tone.  Skin: no rashes, lesions, ulcers. No induration Neurologic: CN 2-12 grossly intact. Sensation intact, DTR normal. Strength 5/5 in all 4.  Psychiatric: Normal judgment and insight. Alert and oriented x 3. Normal mood.    Subsequent Medicare wellness visit   1. Risk factors, based on past  M,S,F -cardiovascular disease risk factors include age, obesity   2.  Physical activities: Is physically active   3.  Depression/mood: Mood is depressed but no diagnosis of depression   4.  Hearing: Significant decreased hearing   5.  ADL's: Independent in all ADLs   6.  Fall risk: Low fall risk   7.  Home safety: No problems identified   8.  Height weight, and visual acuity: height and weight as above, vision: 20/25 bilaterally with correction     9.  Counseling: Advise she update her vaccination status   10. Lab orders based on risk factors: Laboratory update will be reviewed   11. Referral : ENT   12. Care plan: Follow-up with me in 1 year   13. Cognitive assessment: No cognitive impairment   14. Screening: Patient provided with a written and personalized 5-10 year screening schedule in the AVS. yes   15. Provider List Update: PCP, obesity medicine physician  16. Advance Directives: Full code   17. Opioids: Patient is not on any opioid prescriptions and has no risk factors for a substance use disorder.   [place] Office Visit from 11/13/2019 in [place]  PHQ-9 Total Score 8       Fall Risk 10/28/2018 01/17/2019 01/22/2019 12/17/2020  06/16/2021  Falls in the past year? - 0 - - 0  Was there an injury with Fall? - 0 - - 0  Fall Risk Category Calculator - 0 - - 0  Fall Risk Category - Low - - Low  Patient Fall Risk Level Low fall risk - Low fall risk Low fall risk Low fall risk  Patient at Risk for Falls Due to - - - - No Fall Risks     Impression and Plan:  Encounter for preventive health examination -Recommend routine eye  and dental care. -Immunizations: She will get COVID booster at pharmacy, otherwise immunizations are up-to-date -Healthy lifestyle discussed in detail. -Labs to be updated today. -Colon cancer screening: 03/2014, 10-year callback -Breast cancer screening: Last in January/2022, she is overdue she will call her GYN to schedule -Cervical cancer screening: Prefers to defer due to age -Lung cancer screening: Not applicable -Prostate cancer screening: Not applicable -DEXA: Will obtain through [place] office  IGT (impaired glucose tolerance) -She prefers to have labs done at weight and wellness.  Gastroesophageal reflux disease with esophagitis without hemorrhage Esophageal dysphagia -On PPI therapy, follows with GI  Vertigo  - Plan: spironolactone (ALDACTONE) 25 MG tablet, meclizine (ANTIVERT) 25 MG tablet  Bilateral hearing loss, unspecified hearing loss type  - Plan: Ambulatory referral to ENT    Patient Instructions  -Nice seeing you today!!  -Lab work today; will notify you once results are available.  -Remember to get your COVID booster at the pharmacy.  -Schedule follow up in 1 year or sooner as needed.      Lelon Frohlich, MD [facility]

## 2021-06-17 ENCOUNTER — Other Ambulatory Visit (INDEPENDENT_AMBULATORY_CARE_PROVIDER_SITE_OTHER): Payer: Self-pay | Admitting: Family Medicine

## 2021-06-17 DIAGNOSIS — F5101 Primary insomnia: Secondary | ICD-10-CM

## 2021-06-17 NOTE — Telephone Encounter (Signed)
Duplicate request, will refuse once Dr Juleen China sends other request

## 2021-06-17 NOTE — Telephone Encounter (Signed)
Dr.Wallace °

## 2021-06-18 MED ORDER — TEMAZEPAM 7.5 MG PO CAPS: ORAL_CAPSULE | ORAL | 0 refills | Status: DC

## 2021-06-23 ENCOUNTER — Other Ambulatory Visit (INDEPENDENT_AMBULATORY_CARE_PROVIDER_SITE_OTHER): Payer: Self-pay

## 2021-06-23 DIAGNOSIS — F5101 Primary insomnia: Secondary | ICD-10-CM

## 2021-06-24 DIAGNOSIS — J301 Allergic rhinitis due to pollen: Secondary | ICD-10-CM | POA: Diagnosis not present

## 2021-06-24 DIAGNOSIS — J3089 Other allergic rhinitis: Secondary | ICD-10-CM | POA: Diagnosis not present

## 2021-06-24 MED ORDER — TEMAZEPAM 7.5 MG PO CAPS: ORAL_CAPSULE | ORAL | 0 refills | Status: DC

## 2021-07-01 ENCOUNTER — Other Ambulatory Visit (INDEPENDENT_AMBULATORY_CARE_PROVIDER_SITE_OTHER): Payer: Self-pay | Admitting: Family Medicine

## 2021-07-01 DIAGNOSIS — J301 Allergic rhinitis due to pollen: Secondary | ICD-10-CM | POA: Diagnosis not present

## 2021-07-01 DIAGNOSIS — F3289 Other specified depressive episodes: Secondary | ICD-10-CM

## 2021-07-01 DIAGNOSIS — J3089 Other allergic rhinitis: Secondary | ICD-10-CM | POA: Diagnosis not present

## 2021-07-01 NOTE — Telephone Encounter (Signed)
Dr.Wallace °

## 2021-07-05 ENCOUNTER — Encounter: Payer: Self-pay | Admitting: Internal Medicine

## 2021-07-10 DIAGNOSIS — J3089 Other allergic rhinitis: Secondary | ICD-10-CM | POA: Diagnosis not present

## 2021-07-10 DIAGNOSIS — J301 Allergic rhinitis due to pollen: Secondary | ICD-10-CM | POA: Diagnosis not present

## 2021-07-14 DIAGNOSIS — J3089 Other allergic rhinitis: Secondary | ICD-10-CM | POA: Diagnosis not present

## 2021-07-14 DIAGNOSIS — J301 Allergic rhinitis due to pollen: Secondary | ICD-10-CM | POA: Diagnosis not present

## 2021-07-14 NOTE — Progress Notes (Signed)
[place] [facility] [place] Phone: [phone] Subjective:   Cassandra [name], am serving as a scribe for Dr. Hulan Saas. This visit occurred during the SARS-CoV-2 public health emergency.  Safety protocols were in place, including screening questions prior to the visit, additional usage of staff PPE, and extensive cleaning of exam room while observing appropriate contact time as indicated for disinfecting solutions.  I'm seeing this patient by the request  of:  Isaac Bliss, Rayford Halsted, MD  CC: Left knee and right hip pain follow-up  [id number]  04/14/2021 Patient is stable at the moment.  Patient does not want any type of injection.  We will continue to monitor.  Patient is losing weight and I think that that would be the most beneficial thing she can do.  Increase activity as tolerated and follow-up with me in 2 to 3 months.  Update 07/15/2021 Cassandra [name] is a 76 y.o. female coming in with complaint of L knee and R hip pain. Patient states that she twisted her knee over the weekend. Had foot planted and grabbed some toilet paper of a pallet in [employer] and has since had medial knee pain and swelling. Back was doing well but is not painful due L knee pain.        Past Medical History:  Diagnosis Date   Anemia    Anxiety    Asthma    Back pain    BPPV (benign paroxysmal positional vertigo)    Chronic allergic conjunctivitis    Depression    Diverticulosis    Fibromyalgia    Gallbladder problem    Gallstones    GERD (gastroesophageal reflux disease)    Hiatal hernia    History of bladder stone    Iron (Fe) deficiency anemia    Joint pain    Lump in female breast    Mild persistent asthma    followed by pcp--- dr Alfonso Patten. sharma ([place] allergy/asthma)   OA (osteoarthritis)    knees, hands   OAB (overactive bladder)    OSA (obstructive sleep apnea)    per pt has not used cpap several years   Pancreatic cyst     PONV (postoperative nausea and vomiting)    RLS (restless legs syndrome)    Seasonal and perennial allergic rhinitis    SUI (stress urinary incontinence, female)    Swallowing difficulty    Vitamin B12 deficiency    Vitamin D deficiency    Wears glasses    Past Surgical History:  Procedure Laterality Date   BLADDER SUSPENSION  1990's   sling   bladder tacking  2010   with mesh   BREAST LUMPECTOMY Left 1985   Benign cyst   BREAST REDUCTION SURGERY Bilateral 2003 approx.   CORNEA LESION EXCISION Right 2005  approx.   CYSTOSCOPY N/A 07/12/2016   Procedure: CYSTOSCOPY, VAGINOSCOPY, EXAM UNDER ANESTHESIA, STONE LITHOTRIPSY,;  Surgeon: Cleon Gustin, MD;  Location: [facility];  Service: Urology;  Laterality: N/A;   CYSTOSCOPY N/A 10/11/2016   Procedure: CYSTOSCOPY;  Surgeon: Cleon Gustin, MD;  [facility]: [facility];  Service: Urology;  Laterality: N/A;   CYSTOSCOPY WITH LITHOLAPAXY N/A 10/11/2016   Procedure: CYSTOSCOPY WITH LITHOLAPAXY/ EXCISION OF MESH;  Surgeon: Cleon Gustin, MD;  Location: [facility];  Service: Urology;  Laterality: N/A;   CYSTOSCOPY WITH LITHOLAPAXY N/A 01/22/2019   Procedure: CYSTOSCOPY WITH LITHOLAPAXY;  Surgeon: Cleon Gustin, MD;  Location: [place];  Service: Urology;  Laterality: N/A;  1 HR   HOLMIUM LASER APPLICATION  09/06/8117   Procedure: HOLMIUM LASER APPLICATION;  Surgeon: Cleon Gustin, MD;  Location: [facility];  Service: Urology;;   HOLMIUM LASER APPLICATION N/A 1/47/8295   Procedure: HOLMIUM LASER APPLICATION;  Surgeon: Cleon Gustin, MD;  Location: [facility];  Service: Urology;  Laterality: N/A;   RIGHT KNEE ARTHROSCOPY  2013   TOTAL KNEE ARTHROPLASTY Right 09/12/2012   Procedure: RIGHT TOTAL KNEE ARTHROPLASTY;  Surgeon: Gearlean Alf, MD;  Location: [facility];  Service: Orthopedics;  Laterality: Right;   VAGINAL HYSTERECTOMY   1984   Social History   Socioeconomic History   Marital status: Widowed    Spouse name: Not on file   Number of children: Not on file   Years of education: Not on file   Highest education level: Not on file  Occupational History   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  Tobacco Use   Smoking status: Never   Smokeless tobacco: Never  Vaping Use   Vaping Use: Never used  Substance and Sexual Activity   Alcohol use: Not Currently   Drug use: Never   Sexual activity: Not on file  Other Topics Concern   Not on file  Social History Narrative   Not on file   Social Determinants of Health   Financial Resource Strain: Not on file  Food Insecurity: Not on file  Transportation Needs: Not on file  Physical Activity: Not on file  Stress: Not on file  Social Connections: Not on file   Allergies  Allergen Reactions   Shellfish Allergy Hives   Cephalosporins Hives   Codeine     Other reaction(s): Unknown   Hydrochlorothiazide Hives   Misc. Sulfonamide Containing Compounds Hives   Penicillins Hives   Sulfa Antibiotics Hives   Sulfacetamide Sodium     Other reaction(s): Unknown   Family History  Problem Relation Age of Onset   Kidney cancer Mother    Diabetes Mother    Obesity Mother    Alcoholism Father    Colon cancer Neg Hx    Inflammatory bowel disease Neg Hx    Liver disease Neg Hx    Pancreatic cancer Neg Hx    Stomach cancer Neg Hx    Rectal cancer Neg Hx      Current Outpatient Medications (Cardiovascular):    EPINEPHrine 0.3 mg/0.3 mL IJ SOAJ injection, Inject 0.3 mg into the muscle as needed for anaphylaxis.   spironolactone (ALDACTONE) 25 MG tablet, Take 1 tablet (25 mg total) by mouth daily.  Current Outpatient Medications (Respiratory):    fluticasone (FLONASE) 50 MCG/ACT nasal spray, Place into both nostrils.   levocetirizine (XYZAL) 5 MG tablet, Take 5 mg by mouth every evening.     Current Outpatient Medications (Other):    buPROPion  (WELLBUTRIN SR) 200 MG 12 hr tablet, TAKE 1 TABLET (200 MG TOTAL) BY MOUTH DAILY AT 12 NOON.   cycloSPORINE (RESTASIS) 0.05 % ophthalmic emulsion, Place 1 drop into both eyes 2 (two) times daily.   gabapentin (NEURONTIN) 100 MG capsule, TAKE 2 CAPSULES BY MOUTH AT BEDTIME   meclizine (ANTIVERT) 25 MG tablet, Take 1 tablet (25 mg total) by mouth 3 (three) times daily as needed for dizziness.   Misc Natural Products (TART CHERRY ADVANCED) CAPS, Take 1 capsule by mouth. 1200mg  capsule   omeprazole (PRILOSEC) 20 MG capsule, Take 20 mg by mouth 2 (two)  times daily before a meal.   psyllium (REGULOID) 0.52 g capsule, Take 0.52 g by mouth daily.   temazepam (RESTORIL) 7.5 MG capsule, TAKE 1 CAPSULE (7.5 MG TOTAL) BY MOUTH EVERY DAY AT BEDTIME AS NEEDED FOR SLEEP   Reviewed prior external information including notes and imaging from  primary care provider As well as notes that were available from care everywhere and other healthcare systems.  This includes patient's follow-up with healthy weight and wellness as well as her allergy injections.  Past medical history, social, surgical and family history all reviewed in electronic medical record.  [name] pertanent information unless stated regarding to the chief complaint.   Review of Systems:  [name] headache, visual changes, nausea, vomiting, diarrhea, constipation, dizziness, abdominal pain, skin rash, fevers, chills, night sweats, weight loss, swollen lymph nodes, body aches, chest pain, shortness of breath, mood changes. POSITIVE muscle aches, joint swelling   Objective  Blood pressure 110/82, pulse 89, height 5\' 5"  (1.651 m), weight 201 lb (91.2 kg), SpO2 98 %.   General: [name] apparent distress alert and oriented x3 mood and affect normal, dressed appropriately.  HEENT: Pupils equal, extraocular movements intact  Respiratory: Patient's speak in full sentences and does not appear short of breath  Cardiovascular: [name] lower extremity edema, non tender, [name]  erythema  Gait mild antalgic MSK: Left gluteal exam does have some trace effusion noted.  Lacks last 10 degrees of flexion.  Instability with valgus and varus force.  Tender to palpation over the medial joint line.  After informed written and verbal consent, patient was seated on exam table. Left knee was prepped with alcohol swab and utilizing anterolateral approach, patient's left knee space was injected with 4:1  marcaine 0.5%: Kenalog 40mg /dL. Patient tolerated the procedure well without immediate complications.    Impression and Recommendations:     The above documentation has been reviewed and is accurate and complete Lyndal Pulley, DO

## 2021-07-15 ENCOUNTER — Ambulatory Visit: Payer: PPO | Admitting: Family Medicine

## 2021-07-15 ENCOUNTER — Other Ambulatory Visit: Payer: Self-pay

## 2021-07-15 ENCOUNTER — Ambulatory Visit: Payer: Self-pay

## 2021-07-15 ENCOUNTER — Encounter: Payer: Self-pay | Admitting: Family Medicine

## 2021-07-15 VITALS — BP 110/82 | HR 89 | Ht 65.0 in | Wt 201.0 lb

## 2021-07-15 DIAGNOSIS — M1712 Unilateral primary osteoarthritis, left knee: Secondary | ICD-10-CM | POA: Diagnosis not present

## 2021-07-15 DIAGNOSIS — M25551 Pain in right hip: Secondary | ICD-10-CM | POA: Diagnosis not present

## 2021-07-15 NOTE — Patient Instructions (Addendum)
Injected knee today Hope back will feel better after knee injection We can consider another injection if need, write me See me in 6 weeks

## 2021-07-15 NOTE — Assessment & Plan Note (Signed)
Chronic, with exacerbation.  Patient having more pain at the moment that is affecting daily activities.  Increase activity as tolerated.  Hoping the patient has been having some difficulty with antalgic gait that could be contributing to more of the back pain.  Patient does have an MRI of the back over there has shown a nerve impingement and may need another epidural as well.  Encourage patient to continue to work on the weight.  I do believe that patient will continue to make improvement.  Follow-up again 6 to 8 weeks.

## 2021-07-16 DIAGNOSIS — H903 Sensorineural hearing loss, bilateral: Secondary | ICD-10-CM | POA: Diagnosis not present

## 2021-07-21 ENCOUNTER — Encounter (INDEPENDENT_AMBULATORY_CARE_PROVIDER_SITE_OTHER): Payer: Self-pay | Admitting: Family Medicine

## 2021-07-21 ENCOUNTER — Other Ambulatory Visit: Payer: Self-pay

## 2021-07-21 ENCOUNTER — Ambulatory Visit (INDEPENDENT_AMBULATORY_CARE_PROVIDER_SITE_OTHER): Payer: PPO | Admitting: Family Medicine

## 2021-07-21 VITALS — BP 113/70 | HR 82 | Temp 98.2°F | Ht 65.0 in | Wt 195.0 lb

## 2021-07-21 DIAGNOSIS — R9389 Abnormal findings on diagnostic imaging of other specified body structures: Secondary | ICD-10-CM

## 2021-07-21 DIAGNOSIS — J301 Allergic rhinitis due to pollen: Secondary | ICD-10-CM | POA: Diagnosis not present

## 2021-07-21 DIAGNOSIS — F4321 Adjustment disorder with depressed mood: Secondary | ICD-10-CM

## 2021-07-21 DIAGNOSIS — E538 Deficiency of other specified B group vitamins: Secondary | ICD-10-CM | POA: Diagnosis not present

## 2021-07-21 DIAGNOSIS — E785 Hyperlipidemia, unspecified: Secondary | ICD-10-CM

## 2021-07-21 DIAGNOSIS — F3289 Other specified depressive episodes: Secondary | ICD-10-CM

## 2021-07-21 DIAGNOSIS — R5383 Other fatigue: Secondary | ICD-10-CM | POA: Diagnosis not present

## 2021-07-21 DIAGNOSIS — R790 Abnormal level of blood mineral: Secondary | ICD-10-CM | POA: Diagnosis not present

## 2021-07-21 DIAGNOSIS — G4709 Other insomnia: Secondary | ICD-10-CM

## 2021-07-21 DIAGNOSIS — R7303 Prediabetes: Secondary | ICD-10-CM

## 2021-07-21 DIAGNOSIS — J3089 Other allergic rhinitis: Secondary | ICD-10-CM | POA: Diagnosis not present

## 2021-07-21 DIAGNOSIS — E559 Vitamin D deficiency, unspecified: Secondary | ICD-10-CM

## 2021-07-21 DIAGNOSIS — E669 Obesity, unspecified: Secondary | ICD-10-CM | POA: Diagnosis not present

## 2021-07-21 DIAGNOSIS — Z6832 Body mass index (BMI) 32.0-32.9, adult: Secondary | ICD-10-CM | POA: Diagnosis not present

## 2021-07-21 MED ORDER — MIRTAZAPINE 7.5 MG PO TABS: 7.5000 mg | ORAL_TABLET | Freq: Every day | ORAL | 0 refills | Status: DC

## 2021-07-21 MED ORDER — BUPROPION HCL ER (SR) 200 MG PO TB12: 200.0000 mg | ORAL_TABLET | Freq: Every day | ORAL | 2 refills | Status: DC

## 2021-07-22 LAB — COMPREHENSIVE METABOLIC PANEL
ALT: 10 IU/L (ref 0–32)
AST: 11 IU/L (ref 0–40)
Albumin/Globulin Ratio: 1.6 (ref 1.2–2.2)
Albumin: 4.4 g/dL (ref 3.7–4.7)
Alkaline Phosphatase: 89 IU/L (ref 44–121)
BUN/Creatinine Ratio: 15 (ref 12–28)
BUN: 11 mg/dL (ref 8–27)
Bilirubin Total: 0.4 mg/dL (ref 0.0–1.2)
CO2: 22 mmol/L (ref 20–29)
Calcium: 9.4 mg/dL (ref 8.7–10.3)
Chloride: 103 mmol/L (ref 96–106)
Creatinine, Ser: 0.71 mg/dL (ref 0.57–1.00)
Globulin, Total: 2.7 g/dL (ref 1.5–4.5)
Glucose: 94 mg/dL (ref 70–99)
Potassium: 4.6 mmol/L (ref 3.5–5.2)
Sodium: 138 mmol/L (ref 134–144)
Total Protein: 7.1 g/dL (ref 6.0–8.5)
eGFR: 89 mL/min/{1.73_m2} (ref >59)

## 2021-07-22 LAB — T4, FREE: Free T4: 1.01 ng/dL (ref 0.82–1.77)

## 2021-07-22 LAB — IRON,TIBC AND FERRITIN PANEL
Ferritin: 11 ng/mL — ABNORMAL LOW (ref 15–150)
Iron Saturation: 12 % — ABNORMAL LOW (ref 15–55)
Iron: 56 ug/dL (ref 27–139)
Total Iron Binding Capacity: 449 ug/dL (ref 250–450)
UIBC: 393 ug/dL — ABNORMAL HIGH (ref 118–369)

## 2021-07-22 LAB — LIPID PANEL WITH LDL/HDL RATIO
Cholesterol, Total: 185 mg/dL (ref 100–199)
HDL: 55 mg/dL (ref >39)
LDL Chol Calc (NIH): 105 mg/dL — ABNORMAL HIGH (ref 0–99)
LDL/HDL Ratio: 1.9 ratio (ref 0.0–3.2)
Triglycerides: 144 mg/dL (ref 0–149)
VLDL Cholesterol Cal: 25 mg/dL (ref 5–40)

## 2021-07-22 LAB — CBC WITH DIFFERENTIAL/PLATELET
Basophils Absolute: 0 10*3/uL (ref 0.0–0.2)
Basos: 1 %
EOS (ABSOLUTE): 0.1 10*3/uL (ref 0.0–0.4)
Eos: 2 %
Hematocrit: 41.4 % (ref 34.0–46.6)
Hemoglobin: 13.5 g/dL (ref 11.1–15.9)
Immature Grans (Abs): 0 10*3/uL (ref 0.0–0.1)
Immature Granulocytes: 0 %
Lymphocytes Absolute: 2.4 10*3/uL (ref 0.7–3.1)
Lymphs: 32 %
MCH: 26.9 pg (ref 26.6–33.0)
MCHC: 32.6 g/dL (ref 31.5–35.7)
MCV: 83 fL (ref 79–97)
Monocytes Absolute: 0.6 10*3/uL (ref 0.1–0.9)
Monocytes: 9 %
Neutrophils Absolute: 4.3 10*3/uL (ref 1.4–7.0)
Neutrophils: 56 %
Platelets: 290 10*3/uL (ref 150–450)
RBC: 5.01 x10E6/uL (ref 3.77–5.28)
RDW: 13.7 % (ref 11.7–15.4)
WBC: 7.5 10*3/uL (ref 3.4–10.8)

## 2021-07-22 LAB — INSULIN, RANDOM: INSULIN: 24.4 u[IU]/mL (ref 2.6–24.9)

## 2021-07-22 LAB — VITAMIN D 25 HYDROXY (VIT D DEFICIENCY, FRACTURES): Vit D, 25-Hydroxy: 69.4 ng/mL (ref 30.0–100.0)

## 2021-07-22 LAB — TSH: TSH: 2.96 u[IU]/mL (ref 0.450–4.500)

## 2021-07-22 LAB — VITAMIN B12: Vitamin B-12: 639 pg/mL (ref 232–1245)

## 2021-07-22 NOTE — Progress Notes (Signed)
Chief Complaint:   OBESITY Cassandra [name] is here to discuss her progress with her obesity treatment plan along with follow-up of her obesity related diagnoses. See Medical Weight Management Flowsheet for complete bioelectrical impedance results.  Today's visit was #: 21 Starting weight: 193 lbs Starting date: 11/13/2019 Weight change since last visit: +3 lbs Total lbs lost to date: +2 lbs  Nutrition Plan: Mindful Eating for 0% of the time. Activity: None.  Interim History: Cassandra [name]'s brother died and she is grieving the loss.  She endorses vertigo/hearing loss - Neuro PT.  She says this has worsened because of of spironolactone x3 days.  Assessment/Plan:   1. Other insomnia This is moderately controlled.  Current treatment: Restoril 7.5 mg as needed at bedtime.  Plan: Start Remeron 7.5 mg at bedtime as needed for sleep.After discussion, patient would like to start below medication. Expectations, risks, and potential side effects reviewed.   - Start mirtazapine (REMERON) 7.5 MG tablet; Take 1 tablet (7.5 mg total) by mouth at bedtime.  Dispense: 30 tablet; Refill: 0  2. Abnormal findings on imaging test (gallbladder and pancreas) Following along.  Poor candidate for GLP-1RA  3. Vitamin D deficiency Improving, but not optimized. She is not currently taking a vitamin D supplement.  Plan: We will check vitamin D level today.  Lab Results  Component Value Date   VD25OH 45.1 11/26/2020   VD25OH 127.0 (H) 04/17/2020   - VITAMIN D 25 Hydroxy (Vit-D Deficiency, Fractures)  4. Vitamin B12 deficiency Supplementation: None.  Plan:  Check vitamin B12 level today.     - Vitamin B12  5. Hyperlipidemia, unspecified hyperlipidemia type Course: Controlled. Lipid-lowering medications: None.   Plan: Dietary changes: Increase soluble fiber, decrease simple carbohydrates, decrease saturated fat. Exercise changes: Moderate to vigorous-intensity aerobic activity 150 minutes per week or as  tolerated. We will continue to monitor along with PCP/specialists as it pertains to her weight loss journey.  Check labs today.  The 10-year ASCVD risk score (Arnett DK, et al., 2019) is: 27%   Values used to calculate the score:     Age: 76 years     Sex: Female     Is Non-Hispanic African American: No     Diabetic: Yes     Tobacco smoker: No     Systolic Blood Pressure: 035 mmHg     Is BP treated: Yes     HDL Cholesterol: 55 mg/dL     Total Cholesterol: 185 mg/dL  - CBC with Differential/Platelet - Lipid Panel With LDL/HDL Ratio  6. Prediabetes At goal. Goal is HgbA1c < 5.7.  Medication: None.    Plan: She will continue to focus on protein-rich, low simple carbohydrate foods. We reviewed the importance of hydration, regular exercise for stress reduction, and restorative sleep. Will check labs today.  Lab Results  Component Value Date   HGBA1C 5.6 04/17/2020   Lab Results  Component Value Date   INSULIN 19.1 11/26/2020   INSULIN 15.9 04/17/2020   INSULIN 30.1 (H) 11/13/2019   - Comprehensive metabolic panel - Insulin, random  7. Low ferritin Nutrition: Iron-rich foods include dark leafy greens, red and white meats, eggs, seafood, and beans.  Certain foods and drinks prevent your body from absorbing iron properly. Avoid eating these foods in the same meal as iron-rich foods or with iron supplements. These foods include: coffee, black tea, and red wine; milk, dairy products, and foods that are high in calcium; beans and soybeans; whole grains. Constipation can be  a side effect of iron supplementation. Increased water and fiber intake are helpful. Water goal: > 2 liters/day. Fiber goal: > 25 grams/day.  Plan:  Check labs today.  - CBC with Differential/Platelet - Iron, TIBC and Ferritin Panel  8. Other fatigue Will check TSH and free T4 today.  - T4, free - TSH  9. Grief Will continue to monitor.  10. Emotional eating tendencies Not optimized. Medication: Wellbutrin  200 mg daily.   Plan: Continue Wellbutrin 200 mg daily. Will refill today, as per below.Discussed cues and consequences, how thoughts affect eating, model of thoughts, feelings, and behaviors, and strategies for change by focusing on the cue. Discussed cognitive distortions, coping thoughts, and how to change your thoughts.  - Refill buPROPion (WELLBUTRIN SR) 200 MG 12 hr tablet; Take 1 tablet (200 mg total) by mouth daily.  Dispense: 30 tablet; Refill: 2  11. Obesity, current BMI 32.4  Course: Cassandra [name] is currently in the action stage of change. As such, her goal is to continue with weight loss efforts.   Nutrition goals: She has agreed to Mindful Eating.   Exercise goals:  Walking.  Behavioral modification strategies: increasing lean protein intake, decreasing simple carbohydrates, and increasing vegetables.  Cassandra [name] has agreed to follow-up with our clinic in 4 weeks. She was informed of the importance of frequent follow-up visits to maximize her success with intensive lifestyle modifications for her multiple health conditions.   Cassandra [name] was informed we would discuss her lab results at her next visit unless there is a critical issue that needs to be addressed sooner. Cassandra [name] agreed to keep her next visit at the agreed upon time to discuss these results.  Objective:   Blood pressure 113/70, pulse 82, temperature 98.2 F (36.8 C), temperature source Oral, height 5\' 5"  (1.651 m), weight 195 lb (88.5 kg), SpO2 96 %. Body mass index is 32.45 kg/m.  General: Cooperative, alert, well developed, in no acute distress. HEENT: Conjunctivae and lids unremarkable. Cardiovascular: Regular rhythm.  Lungs: Normal work of breathing. Neurologic: No focal deficits.   Lab Results  Component Value Date   HGBA1C 5.6 04/17/2020   HGBA1C 5.7 (H) 11/13/2019   HGBA1C 5.4 08/29/2019   HGBA1C 6.1 01/17/2019   HGBA1C 4.9 01/06/2007   Lab Results  Component Value Date   INSULIN 19.1 11/26/2020   INSULIN 15.9  04/17/2020   INSULIN 30.1 (H) 11/13/2019   Lab Results  Component Value Date   VD25OH 45.1 11/26/2020   VD25OH 127.0 (H) 04/17/2020   Attestation Statements:   Reviewed by clinician on day of visit: allergies, medications, problem list, medical history, surgical history, family history, social history, and previous encounter notes.  I, Water quality scientist, CMA, am acting as transcriptionist for Briscoe Deutscher, DO  I have reviewed the above documentation for accuracy and completeness, and I agree with the above. -  Briscoe Deutscher, DO, MS, FAAFP, DABOM - Family and Bariatric Medicine.

## 2021-07-29 DIAGNOSIS — J3089 Other allergic rhinitis: Secondary | ICD-10-CM | POA: Diagnosis not present

## 2021-07-29 DIAGNOSIS — J301 Allergic rhinitis due to pollen: Secondary | ICD-10-CM | POA: Diagnosis not present

## 2021-08-04 DIAGNOSIS — J3089 Other allergic rhinitis: Secondary | ICD-10-CM | POA: Diagnosis not present

## 2021-08-04 DIAGNOSIS — J301 Allergic rhinitis due to pollen: Secondary | ICD-10-CM | POA: Diagnosis not present

## 2021-08-12 DIAGNOSIS — J3089 Other allergic rhinitis: Secondary | ICD-10-CM | POA: Diagnosis not present

## 2021-08-12 DIAGNOSIS — J3081 Allergic rhinitis due to animal (cat) (dog) hair and dander: Secondary | ICD-10-CM | POA: Diagnosis not present

## 2021-08-12 DIAGNOSIS — J301 Allergic rhinitis due to pollen: Secondary | ICD-10-CM | POA: Diagnosis not present

## 2021-08-17 ENCOUNTER — Other Ambulatory Visit (INDEPENDENT_AMBULATORY_CARE_PROVIDER_SITE_OTHER): Payer: Self-pay | Admitting: Family Medicine

## 2021-08-17 DIAGNOSIS — G4709 Other insomnia: Secondary | ICD-10-CM

## 2021-08-17 NOTE — Telephone Encounter (Signed)
Dr.Wallace °

## 2021-08-18 ENCOUNTER — Encounter (INDEPENDENT_AMBULATORY_CARE_PROVIDER_SITE_OTHER): Payer: Self-pay | Admitting: Family Medicine

## 2021-08-18 DIAGNOSIS — J3089 Other allergic rhinitis: Secondary | ICD-10-CM | POA: Diagnosis not present

## 2021-08-18 DIAGNOSIS — G4709 Other insomnia: Secondary | ICD-10-CM

## 2021-08-18 DIAGNOSIS — J301 Allergic rhinitis due to pollen: Secondary | ICD-10-CM | POA: Diagnosis not present

## 2021-08-18 MED ORDER — ZOLPIDEM TARTRATE 5 MG PO TABS: 5.0000 mg | ORAL_TABLET | Freq: Every evening | ORAL | 0 refills | Status: DC | PRN

## 2021-08-18 NOTE — Telephone Encounter (Signed)
Dr.Wallace °

## 2021-08-20 ENCOUNTER — Ambulatory Visit (INDEPENDENT_AMBULATORY_CARE_PROVIDER_SITE_OTHER): Payer: PPO | Admitting: Family Medicine

## 2021-08-24 ENCOUNTER — Ambulatory Visit (INDEPENDENT_AMBULATORY_CARE_PROVIDER_SITE_OTHER): Payer: PPO | Admitting: Family Medicine

## 2021-08-24 ENCOUNTER — Other Ambulatory Visit: Payer: Self-pay

## 2021-08-24 ENCOUNTER — Encounter (INDEPENDENT_AMBULATORY_CARE_PROVIDER_SITE_OTHER): Payer: Self-pay | Admitting: Family Medicine

## 2021-08-24 VITALS — BP 119/70 | HR 86 | Temp 98.1°F | Ht 65.0 in | Wt 198.0 lb

## 2021-08-24 DIAGNOSIS — Z6833 Body mass index (BMI) 33.0-33.9, adult: Secondary | ICD-10-CM

## 2021-08-24 DIAGNOSIS — K869 Disease of pancreas, unspecified: Secondary | ICD-10-CM

## 2021-08-24 DIAGNOSIS — R1319 Other dysphagia: Secondary | ICD-10-CM

## 2021-08-24 DIAGNOSIS — K8020 Calculus of gallbladder without cholecystitis without obstruction: Secondary | ICD-10-CM | POA: Diagnosis not present

## 2021-08-24 DIAGNOSIS — F5101 Primary insomnia: Secondary | ICD-10-CM

## 2021-08-24 DIAGNOSIS — E669 Obesity, unspecified: Secondary | ICD-10-CM

## 2021-08-24 DIAGNOSIS — R790 Abnormal level of blood mineral: Secondary | ICD-10-CM

## 2021-08-25 DIAGNOSIS — J301 Allergic rhinitis due to pollen: Secondary | ICD-10-CM | POA: Diagnosis not present

## 2021-08-25 DIAGNOSIS — J3081 Allergic rhinitis due to animal (cat) (dog) hair and dander: Secondary | ICD-10-CM | POA: Diagnosis not present

## 2021-08-25 DIAGNOSIS — J3089 Other allergic rhinitis: Secondary | ICD-10-CM | POA: Diagnosis not present

## 2021-08-25 NOTE — Progress Notes (Signed)
?Cassandra [name] D.O. ?[facility] ?[place] ?Phone: [phone] ?Subjective:   ?I, Cassandra [name], am serving as a scribe for Dr. Hulan Saas. ? ?This visit occurred during the SARS-CoV-2 public health emergency.  Safety protocols were in place, including screening questions prior to the visit, additional usage of staff PPE, and extensive cleaning of exam room while observing appropriate contact time as indicated for disinfecting solutions.  ? ?I'm seeing this patient by the request  of:  Isaac Bliss, Rayford Halsted, MD ? ?CC: Left knee pain, hip pain, ? ?[id number]  ?07/15/2021 ?Chronic, with exacerbation.  Patient having more pain at the moment that is affecting daily activities.  Increase activity as tolerated.  Hoping the patient has been having some difficulty with antalgic gait that could be contributing to more of the back pain.  Patient does have an MRI of the back over there has shown a nerve impingement and may need another epidural as well.  Encourage patient to continue to work on the weight.  I do believe that patient will continue to make improvement.  Follow-up again 6 to 8 weeks.   ? ?08/26/2021 ?Cassandra [name] is a 76 y.o. female coming in with complaint of L knee and R hip pain. Patient states that pain is over medial aspect of knee for past week. Ice is not helping to decrease her inflammation. Antalgic gait today. R hip pain increased since knee pain started. Needs refill of gabapentin.  ? ? ?  ? ?Past Medical History:  ?Diagnosis Date  ? Anemia   ? Anxiety   ? Asthma   ? Back pain   ? BPPV (benign paroxysmal positional vertigo)   ? Chronic allergic conjunctivitis   ? Depression   ? Diverticulosis   ? Fibromyalgia   ? Gallbladder problem   ? Gallstones   ? GERD (gastroesophageal reflux disease)   ? Hiatal hernia   ? History of bladder stone   ? Iron (Fe) deficiency anemia   ? Joint pain   ? Lump in female breast   ? Mild persistent asthma   ? followed by  pcp--- dr r. sharma ([place] allergy/asthma)  ? OA (osteoarthritis)   ? knees, hands  ? OAB (overactive bladder)   ? OSA (obstructive sleep apnea)   ? per pt has not used cpap several years  ? Pancreatic cyst   ? PONV (postoperative nausea and vomiting)   ? RLS (restless legs syndrome)   ? Seasonal and perennial allergic rhinitis   ? SUI (stress urinary incontinence, female)   ? Swallowing difficulty   ? Vitamin B12 deficiency   ? Vitamin D deficiency   ? Wears glasses   ? ?Past Surgical History:  ?Procedure Laterality Date  ? BLADDER SUSPENSION  1990's  ? sling  ? bladder tacking  2010  ? with mesh  ? BREAST LUMPECTOMY Left 1985  ? Benign cyst  ? BREAST REDUCTION SURGERY Bilateral 2003 approx.  ? CORNEA LESION EXCISION Right 2005  approx.  ? CYSTOSCOPY N/A 07/12/2016  ? Procedure: CYSTOSCOPY, VAGINOSCOPY, EXAM UNDER ANESTHESIA, STONE LITHOTRIPSY,;  Surgeon: Cleon Gustin, MD;  Location: [facility];  Service: Urology;  Laterality: N/A;  ? CYSTOSCOPY N/A 10/11/2016  ? Procedure: CYSTOSCOPY;  Surgeon: Cleon Gustin, MD;  Location: [facility];  Service: Urology;  Laterality: N/A;  ? CYSTOSCOPY WITH LITHOLAPAXY N/A 10/11/2016  ? Procedure: CYSTOSCOPY WITH LITHOLAPAXY/ EXCISION OF MESH;  Surgeon: Cleon Gustin,  MD;  Location: [place];  Service: Urology;  Laterality: N/A;  ? CYSTOSCOPY WITH LITHOLAPAXY N/A 01/22/2019  ? Procedure: CYSTOSCOPY WITH LITHOLAPAXY;  Surgeon: Cleon Gustin, MD;  Location: [facility];  Service: Urology;  Laterality: N/A;  1 HR  ? HOLMIUM LASER APPLICATION  8/52/7782  ? Procedure: HOLMIUM LASER APPLICATION;  Surgeon: Cleon Gustin, MD;  Location: [facility];  Service: Urology;;  ? HOLMIUM LASER APPLICATION N/A 09/21/5359  ? Procedure: HOLMIUM LASER APPLICATION;  Surgeon: Cleon Gustin, MD;  Location: [facility];  Service: Urology;  Laterality: N/A;  ? RIGHT KNEE  ARTHROSCOPY  2013  ? TOTAL KNEE ARTHROPLASTY Right 09/12/2012  ? Procedure: RIGHT TOTAL KNEE ARTHROPLASTY;  Surgeon: Gearlean Alf, MD;  Location: [facility];  Service: Orthopedics;  Laterality: Right;  ? VAGINAL HYSTERECTOMY  1984  ? ?Social History  ? ?Socioeconomic History  ? Marital status: Widowed  ?  Spouse name: Not on file  ? Number of children: Not on file  ? Years of education: Not on file  ? Highest education level: Not on file  ?Occupational History  ? Occupation: Biochemist, clinical Retired  ?Tobacco Use  ? Smoking status: Never  ? Smokeless tobacco: Never  ?Vaping Use  ? Vaping Use: Never used  ?Substance and Sexual Activity  ? Alcohol use: Not Currently  ? Drug use: Never  ? Sexual activity: Not on file  ?Other Topics Concern  ? Not on file  ?Social History Narrative  ? Not on file  ? ?Social Determinants of Health  ? ?Financial Resource Strain: Not on file  ?Food Insecurity: Not on file  ?Transportation Needs: Not on file  ?Physical Activity: Not on file  ?Stress: Not on file  ?Social Connections: Not on file  ? ?Allergies  ?Allergen Reactions  ? Shellfish Allergy Hives  ? Cephalosporins Hives  ? Codeine   ?  Other reaction(s): Unknown  ? Hydrochlorothiazide Hives  ? Misc. Sulfonamide Containing Compounds Hives  ? Penicillins Hives  ? Sulfa Antibiotics Hives  ? Sulfacetamide Sodium   ?  Other reaction(s): Unknown  ? ?Family History  ?Problem Relation Age of Onset  ? Kidney cancer Mother   ? Diabetes Mother   ? Obesity Mother   ? Alcoholism Father   ? Colon cancer Neg Hx   ? Inflammatory bowel disease Neg Hx   ? Liver disease Neg Hx   ? Pancreatic cancer Neg Hx   ? Stomach cancer Neg Hx   ? Rectal cancer Neg Hx   ? ? ? ?Current Outpatient Medications (Cardiovascular):  ?  EPINEPHrine 0.3 mg/0.3 mL IJ SOAJ injection, Inject 0.3 mg into the muscle as needed for anaphylaxis. ?  spironolactone (ALDACTONE) 25 MG tablet, Take 1 tablet (25 mg total) by mouth daily. ? ?Current Outpatient Medications  (Respiratory):  ?  fluticasone (FLONASE) 50 MCG/ACT nasal spray, Place into both nostrils. ?  levocetirizine (XYZAL) 5 MG tablet, Take 5 mg by mouth every evening.  ? ? ? ?Current Outpatient Medications (Other):  ?  buPROPion (WELLBUTRIN SR) 200 MG 12 hr tablet, Take 1 tablet (200 mg total) by mouth daily. ?  gabapentin (NEURONTIN) 100 MG capsule, Take 2 capsules (200 mg total) by mouth at bedtime. ?  Misc Natural Products (TART CHERRY ADVANCED) CAPS, Take 1 capsule by mouth. '1200mg'$  capsule ?  omeprazole (PRILOSEC) 20 MG capsule, Take 20 mg by mouth 2 (two) times daily before a meal. ?  psyllium (  REGULOID) 0.52 g capsule, Take 0.52 g by mouth daily. ?  zolpidem (AMBIEN) 5 MG tablet, Take 1 tablet (5 mg total) by mouth at bedtime as needed for sleep. ? ? ?Reviewed prior external information including notes and imaging from  ?primary care provider ?As well as notes that were available from care everywhere and other healthcare systems. ? ?Past medical history, social, surgical and family history all reviewed in electronic medical record.  No pertanent information unless stated regarding to the chief complaint.  ? ?Review of Systems: ? No headache, visual changes, nausea, vomiting, diarrhea, constipation, dizziness, abdominal pain, skin rash, fevers, chills, night sweats, weight loss, swollen lymph nodes, body aches, joint swelling, chest pain, shortness of breath, mood changes. POSITIVE muscle aches ? ?Objective  ?Blood pressure 124/64, pulse 77, height '5\' 5"'$  (1.651 m), weight 198 lb (89.8 kg), SpO2 96 %. ?  ?General: No apparent distress alert and oriented x3 mood and affect normal, dressed appropriately.  ?HEENT: Pupils equal, extraocular movements intact  ?Respiratory: Patient's speak in full sentences and does not appear short of breath  ?Cardiovascular: No lower extremity edema, non tender, no erythema  ?Gait antalgic gait ?MSK: Left knee does have a trace effusion noted.  Lacking the last 10 degrees of flexion.   Lacks 5 degrees of extension are also difficult.  Tender to palpation over the medial joint line.  Positive McMurray's noted.  Instability with valgus and varus force ? ?Limited muscular skeletal ultrasound was perfo

## 2021-08-26 ENCOUNTER — Ambulatory Visit: Payer: PPO | Admitting: Family Medicine

## 2021-08-26 ENCOUNTER — Encounter: Payer: Self-pay | Admitting: Family Medicine

## 2021-08-26 ENCOUNTER — Other Ambulatory Visit (INDEPENDENT_AMBULATORY_CARE_PROVIDER_SITE_OTHER): Payer: Self-pay | Admitting: Family Medicine

## 2021-08-26 ENCOUNTER — Ambulatory Visit: Payer: Self-pay

## 2021-08-26 VITALS — BP 124/64 | HR 77 | Ht 65.0 in | Wt 198.0 lb

## 2021-08-26 DIAGNOSIS — M1712 Unilateral primary osteoarthritis, left knee: Secondary | ICD-10-CM | POA: Diagnosis not present

## 2021-08-26 DIAGNOSIS — G8929 Other chronic pain: Secondary | ICD-10-CM

## 2021-08-26 DIAGNOSIS — M25562 Pain in left knee: Secondary | ICD-10-CM | POA: Diagnosis not present

## 2021-08-26 DIAGNOSIS — F3289 Other specified depressive episodes: Secondary | ICD-10-CM

## 2021-08-26 MED ORDER — GABAPENTIN 100 MG PO CAPS: 200.0000 mg | ORAL_CAPSULE | Freq: Every day | ORAL | 0 refills | Status: DC

## 2021-08-26 NOTE — Patient Instructions (Addendum)
Monovisc today ?Think about what we talked about ?Sorry for your losses ?See me again in 8 weeks ? ?

## 2021-08-26 NOTE — Assessment & Plan Note (Signed)
Significant arthritic changes of the knee noted.  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  Patient was given viscosupplementation today because patient has had sinus problems as well.  Patient is to increase activity slowly.  Follow-up again in 6 to 8 weeks ?

## 2021-09-01 NOTE — Progress Notes (Signed)
Chief Complaint:   OBESITY Cassandra [name] is here to discuss her progress with her obesity treatment plan along with follow-up of her obesity related diagnoses. See Medical Weight Management Flowsheet for complete bioelectrical impedance results.  Today's visit was #: 22 Starting weight: 193 lbs Starting date: 11/13/2019 Weight change since last visit: 3 lbs Total lbs lost to date: +5 lbs  Nutrition Plan: Mindful Eating 0% of the time. Activity: Walking for 30 minutes 5-7 times per week.  Interim History: Cassandra [name] says that Remeron caused nightmares.  Heightened Wakefulness.  She is taking Ambien now, which she says is helping.  In previous months, she has tried metformin, Wellbutrin, and phentermine.  She says that food is getting stuck in her throat.  GI wants to do an EGD.  She endorses some RUQ pain.  Assessment/Plan:   1. Primary insomnia This is moderately controlled.  Current treatment: Ambien 5 mg as needed at bedtime.   Plan: The current medical regimen is effective;  continue present plan and medications.  2. Low ferritin Nutrition: Iron-rich foods include dark leafy greens, red and white meats, eggs, seafood, and beans.  Certain foods and drinks prevent your body from absorbing iron properly. Avoid eating these foods in the same meal as iron-rich foods or with iron supplements. These foods include: coffee, black tea, and red wine; milk, dairy products, and foods that are high in calcium; beans and soybeans; whole grains. Constipation can be a side effect of iron supplementation. Increased water and fiber intake are helpful. Water goal: > 2 liters/day. Fiber goal: > 25 grams/day.  3. Esophageal dysphagia Cassandra [name] says that food is getting stuck in her throat.  GI would like her to have an EGD. We will continue to monitor symptoms as they relate to her weight loss journey.  4. Calculus of gallbladder without cholecystitis without obstruction Cassandra [name] will continue to avoid fatty foods. I  have encouraged her to follow up with the general surgeon. We will continue to monitor symptoms as they relate to her weight loss journey.  5. Pancreatic lesion Will continue to monitor symptoms as they relate to her weight loss journey.  6. Obesity, current BMI 33 Course: Cassandra [name] is currently in the action stage of change. As such, her goal is to continue with weight loss efforts.   Nutrition goals: She has agreed to the Category 1 Plan.   Exercise goals:  As is.  Behavioral modification strategies: increasing lean protein intake, decreasing simple carbohydrates, increasing vegetables, and increasing water intake.  Cassandra [name] has agreed to follow-up with our clinic in 4 weeks. She was informed of the importance of frequent follow-up visits to maximize her success with intensive lifestyle modifications for her multiple health conditions.   Objective:   Blood pressure 119/70, pulse 86, temperature 98.1 F (36.7 C), temperature source Oral, height '5\' 5"'$  (1.651 m), weight 198 lb (89.8 kg), SpO2 96 %. Body mass index is 32.95 kg/m.  General: Cooperative, alert, well developed, in no acute distress. HEENT: Conjunctivae and lids unremarkable. Cardiovascular: Regular rhythm.  Lungs: Normal work of breathing. Neurologic: No focal deficits.   Lab Results  Component Value Date   CREATININE 0.71 07/21/2021   BUN 11 07/21/2021   NA 138 07/21/2021   K 4.6 07/21/2021   CL 103 07/21/2021   CO2 22 07/21/2021   Lab Results  Component Value Date   ALT 10 07/21/2021   AST 11 07/21/2021   ALKPHOS 89 07/21/2021   BILITOT 0.4 07/21/2021  Lab Results  Component Value Date   HGBA1C 5.6 04/17/2020   HGBA1C 5.7 (H) 11/13/2019   HGBA1C 5.4 08/29/2019   HGBA1C 6.1 01/17/2019   HGBA1C 4.9 01/06/2007   Lab Results  Component Value Date   INSULIN 24.4 07/21/2021   INSULIN 19.1 11/26/2020   INSULIN 15.9 04/17/2020   INSULIN 30.1 (H) 11/13/2019   Lab Results  Component Value Date   TSH 2.960  07/21/2021   Lab Results  Component Value Date   CHOL 185 07/21/2021   HDL 55 07/21/2021   LDLCALC 105 (H) 07/21/2021   TRIG 144 07/21/2021   CHOLHDL 3.0 11/26/2020   Lab Results  Component Value Date   VD25OH 69.4 07/21/2021   VD25OH 45.1 11/26/2020   VD25OH 127.0 (H) 04/17/2020   Lab Results  Component Value Date   WBC 7.5 07/21/2021   HGB 13.5 07/21/2021   HCT 41.4 07/21/2021   MCV 83 07/21/2021   PLT 290 07/21/2021   Lab Results  Component Value Date   IRON 56 07/21/2021   TIBC 449 07/21/2021   FERRITIN 11 (L) 07/21/2021   Attestation Statements:   Reviewed by clinician on day of visit: allergies, medications, problem list, medical history, surgical history, family history, social history, and previous encounter notes.  I, Water quality scientist, CMA, am acting as transcriptionist for Briscoe Deutscher, DO  I have reviewed the above documentation for accuracy and completeness, and I agree with the above. -  Briscoe Deutscher, DO, MS, FAAFP, DABOM - Family and Bariatric Medicine.

## 2021-09-02 DIAGNOSIS — J301 Allergic rhinitis due to pollen: Secondary | ICD-10-CM | POA: Diagnosis not present

## 2021-09-02 DIAGNOSIS — J3081 Allergic rhinitis due to animal (cat) (dog) hair and dander: Secondary | ICD-10-CM | POA: Diagnosis not present

## 2021-09-02 DIAGNOSIS — J3089 Other allergic rhinitis: Secondary | ICD-10-CM | POA: Diagnosis not present

## 2021-09-09 DIAGNOSIS — J301 Allergic rhinitis due to pollen: Secondary | ICD-10-CM | POA: Diagnosis not present

## 2021-09-09 DIAGNOSIS — J3081 Allergic rhinitis due to animal (cat) (dog) hair and dander: Secondary | ICD-10-CM | POA: Diagnosis not present

## 2021-09-09 DIAGNOSIS — J3089 Other allergic rhinitis: Secondary | ICD-10-CM | POA: Diagnosis not present

## 2021-09-15 DIAGNOSIS — J3089 Other allergic rhinitis: Secondary | ICD-10-CM | POA: Diagnosis not present

## 2021-09-15 DIAGNOSIS — J301 Allergic rhinitis due to pollen: Secondary | ICD-10-CM | POA: Diagnosis not present

## 2021-09-22 ENCOUNTER — Encounter (INDEPENDENT_AMBULATORY_CARE_PROVIDER_SITE_OTHER): Payer: Self-pay | Admitting: Family Medicine

## 2021-09-22 ENCOUNTER — Ambulatory Visit (INDEPENDENT_AMBULATORY_CARE_PROVIDER_SITE_OTHER): Payer: PPO | Admitting: Family Medicine

## 2021-09-22 VITALS — BP 105/62 | HR 83 | Temp 98.6°F | Ht 65.0 in | Wt 199.0 lb

## 2021-09-22 DIAGNOSIS — F419 Anxiety disorder, unspecified: Secondary | ICD-10-CM | POA: Diagnosis not present

## 2021-09-22 DIAGNOSIS — J3081 Allergic rhinitis due to animal (cat) (dog) hair and dander: Secondary | ICD-10-CM | POA: Diagnosis not present

## 2021-09-22 DIAGNOSIS — E669 Obesity, unspecified: Secondary | ICD-10-CM

## 2021-09-22 DIAGNOSIS — Z6833 Body mass index (BMI) 33.0-33.9, adult: Secondary | ICD-10-CM

## 2021-09-22 DIAGNOSIS — G4709 Other insomnia: Secondary | ICD-10-CM

## 2021-09-22 DIAGNOSIS — J301 Allergic rhinitis due to pollen: Secondary | ICD-10-CM | POA: Diagnosis not present

## 2021-09-22 DIAGNOSIS — J3089 Other allergic rhinitis: Secondary | ICD-10-CM | POA: Diagnosis not present

## 2021-09-22 MED ORDER — ESCITALOPRAM OXALATE 10 MG PO TABS: 10.0000 mg | ORAL_TABLET | Freq: Every day | ORAL | 0 refills | Status: DC

## 2021-09-22 MED ORDER — ZOLPIDEM TARTRATE 5 MG PO TABS: 5.0000 mg | ORAL_TABLET | Freq: Every evening | ORAL | 0 refills | Status: DC | PRN

## 2021-09-30 DIAGNOSIS — J3089 Other allergic rhinitis: Secondary | ICD-10-CM | POA: Diagnosis not present

## 2021-09-30 DIAGNOSIS — J301 Allergic rhinitis due to pollen: Secondary | ICD-10-CM | POA: Diagnosis not present

## 2021-10-05 NOTE — Progress Notes (Signed)
? ? ? ?Chief Complaint:  ? ?OBESITY ?Cassandra [name] is here to discuss her progress with her obesity treatment plan along with follow-up of her obesity related diagnoses. Cassandra [name] is on the Category 1 Plan and states she is following her eating plan approximately 50% of the time. Cassandra [name] states she is walking for 30 minutes 4-5 times per week. ? ?Today's visit was #: 23 ?Starting weight: 193 lbs ?Starting date: 11/13/2019 ?Today's weight: 199 lbs ?Today's date: 09/22/2021 ?Total lbs lost to date: 0 ?Total lbs lost since last in-office visit: 0 ? ?Interim History: Cassandra [name] has been struggling with weight loss. She is bored with her plan and meal planning has ben difficult. She is open to looking at other meal plans. ? ?Subjective:  ? ?1. Anxiety ?Cassandra [name] notes her anxiety seems worse and Wellbutrin isn't helping. She is a fast talker naturally and her speech is slightly pressured.  ? ?2. Other insomnia ?Cassandra [name] last physician prescribed her Ambien, which is not ideal at her age. She wakes frequently and wakes unrefreshed.  ? ?Assessment/Plan:  ? ?1. Anxiety ?Cassandra [name] agreed to discontinue Wellbutrin, and start Lexapro 10 mg q AM with no refills. We will follow up at her next visit. ? ?- escitalopram (LEXAPRO) 10 MG tablet; Take 1 tablet (10 mg total) by mouth daily with breakfast.  Dispense: 30 tablet; Refill: 0 ? ?2. Other insomnia ?We will refer Cassandra [name] to Cassandra [name] for a sleep evaluation. We will refill Ambien for 1 month with the goal of tapering off this medication.  ? ?- zolpidem (AMBIEN) 5 MG tablet; Take 1 tablet (5 mg total) by mouth at bedtime as needed for sleep.  Dispense: 30 tablet; Refill: 0 ?- Ambulatory referral to Sleep Studies ? ?3. Obesity, current BMI 33.2 ?Cassandra [name] is currently in the action stage of change. As such, her goal is to continue with weight loss efforts. She has agreed to change to the Helper.  ? ?Exercise goals: As is. ? ?Behavioral modification strategies: meal planning and cooking strategies and  planning for success. ? ?Cassandra [name] has agreed to follow-up with our clinic in 4 weeks. She was informed of the importance of frequent follow-up visits to maximize her success with intensive lifestyle modifications for her multiple health conditions.  ? ?Objective:  ? ?Blood pressure 105/62, [name] 83, temperature 98.6 ?F (37 ?C), height '5\' 5"'$  (1.651 m), weight 199 lb (90.3 kg), SpO2 98 %. ?Body mass index is 33.12 kg/m?. ? ?General: Cooperative, alert, well developed, in no acute distress. ?HEENT: Conjunctivae and lids unremarkable. ?Cardiovascular: Regular rhythm.  ?Lungs: Normal work of breathing. ?Neurologic: No focal deficits.  ? ?Lab Results  ?Component Value Date  ? CREATININE 0.71 07/21/2021  ? BUN 11 07/21/2021  ? NA 138 07/21/2021  ? K 4.6 07/21/2021  ? CL 103 07/21/2021  ? CO2 22 07/21/2021  ? ?Lab Results  ?Component Value Date  ? ALT 10 07/21/2021  ? AST 11 07/21/2021  ? ALKPHOS 89 07/21/2021  ? BILITOT 0.4 07/21/2021  ? ?Lab Results  ?Component Value Date  ? HGBA1C 5.6 04/17/2020  ? HGBA1C 5.7 (H) 11/13/2019  ? HGBA1C 5.4 08/29/2019  ? HGBA1C 6.1 01/17/2019  ? HGBA1C 4.9 01/06/2007  ? ?Lab Results  ?Component Value Date  ? INSULIN 24.4 07/21/2021  ? INSULIN 19.1 11/26/2020  ? INSULIN 15.9 04/17/2020  ? INSULIN 30.1 (H) 11/13/2019  ? ?Lab Results  ?Component Value Date  ? TSH 2.960 07/21/2021  ? ?Lab Results  ?Component Value Date  ? CHOL  185 07/21/2021  ? HDL 55 07/21/2021  ? LDLCALC 105 (H) 07/21/2021  ? TRIG 144 07/21/2021  ? CHOLHDL 3.0 11/26/2020  ? ?Lab Results  ?Component Value Date  ? VD25OH 69.4 07/21/2021  ? VD25OH 45.1 11/26/2020  ? VD25OH 127.0 (H) 04/17/2020  ? ?Lab Results  ?Component Value Date  ? WBC 7.5 07/21/2021  ? HGB 13.5 07/21/2021  ? HCT 41.4 07/21/2021  ? MCV 83 07/21/2021  ? PLT 290 07/21/2021  ? ?Lab Results  ?Component Value Date  ? IRON 56 07/21/2021  ? TIBC 449 07/21/2021  ? FERRITIN 11 (L) 07/21/2021  ? ? ?Obesity Behavioral Intervention:  ? ?Approximately 15 minutes were spent  on the discussion below. ? ?ASK: ?We discussed the diagnosis of obesity with Cassandra [name] today and Cassandra [name] agreed to give [place] permission to discuss obesity behavioral modification therapy today. ? ?ASSESS: ?Cassandra [name] has the diagnosis of obesity and her BMI today is 33.2. Cassandra [name] is in the action stage of change.  ? ?ADVISE: ?Cassandra [name] was educated on the multiple health risks of obesity as well as the benefit of weight loss to improve her health. She was advised of the need for long term treatment and the importance of lifestyle modifications to improve her current health and to decrease her risk of future health problems. ? ?AGREE: ?Multiple dietary modification options and treatment options were discussed and Cassandra [name] agreed to follow the recommendations documented in the above note. ? ?ARRANGE: ?Cassandra [name] was educated on the importance of frequent visits to treat obesity as outlined per CMS and USPSTF guidelines and agreed to schedule her next follow up appointment today. ? ?Attestation Statements:  ? ?Reviewed by clinician on day of visit: allergies, medications, problem list, medical history, surgical history, family history, social history, and previous encounter notes. ? ? ?I, Trixie Dredge, am acting as transcriptionist for Dennard Nip, MD. ? ?I have reviewed the above documentation for accuracy and completeness, and I agree with the above. -  Dennard Nip, MD ? ? ?

## 2021-10-06 DIAGNOSIS — J3089 Other allergic rhinitis: Secondary | ICD-10-CM | POA: Diagnosis not present

## 2021-10-06 DIAGNOSIS — J301 Allergic rhinitis due to pollen: Secondary | ICD-10-CM | POA: Diagnosis not present

## 2021-10-13 DIAGNOSIS — J301 Allergic rhinitis due to pollen: Secondary | ICD-10-CM | POA: Diagnosis not present

## 2021-10-13 DIAGNOSIS — J3089 Other allergic rhinitis: Secondary | ICD-10-CM | POA: Diagnosis not present

## 2021-10-16 ENCOUNTER — Other Ambulatory Visit (INDEPENDENT_AMBULATORY_CARE_PROVIDER_SITE_OTHER): Payer: Self-pay | Admitting: Family Medicine

## 2021-10-16 DIAGNOSIS — F419 Anxiety disorder, unspecified: Secondary | ICD-10-CM

## 2021-10-20 ENCOUNTER — Ambulatory Visit (INDEPENDENT_AMBULATORY_CARE_PROVIDER_SITE_OTHER): Payer: PPO | Admitting: Family Medicine

## 2021-10-20 ENCOUNTER — Encounter (INDEPENDENT_AMBULATORY_CARE_PROVIDER_SITE_OTHER): Payer: Self-pay | Admitting: Family Medicine

## 2021-10-20 VITALS — BP 110/61 | HR 83 | Temp 97.6°F | Ht 65.0 in | Wt 200.0 lb

## 2021-10-20 DIAGNOSIS — J3089 Other allergic rhinitis: Secondary | ICD-10-CM | POA: Diagnosis not present

## 2021-10-20 DIAGNOSIS — E785 Hyperlipidemia, unspecified: Secondary | ICD-10-CM | POA: Diagnosis not present

## 2021-10-20 DIAGNOSIS — Z6833 Body mass index (BMI) 33.0-33.9, adult: Secondary | ICD-10-CM

## 2021-10-20 DIAGNOSIS — E8881 Metabolic syndrome: Secondary | ICD-10-CM

## 2021-10-20 DIAGNOSIS — J3081 Allergic rhinitis due to animal (cat) (dog) hair and dander: Secondary | ICD-10-CM | POA: Diagnosis not present

## 2021-10-20 DIAGNOSIS — E88819 Insulin resistance, unspecified: Secondary | ICD-10-CM | POA: Insufficient documentation

## 2021-10-20 DIAGNOSIS — J301 Allergic rhinitis due to pollen: Secondary | ICD-10-CM | POA: Diagnosis not present

## 2021-10-20 DIAGNOSIS — F3289 Other specified depressive episodes: Secondary | ICD-10-CM

## 2021-10-20 DIAGNOSIS — E669 Obesity, unspecified: Secondary | ICD-10-CM | POA: Diagnosis not present

## 2021-10-20 DIAGNOSIS — F32A Depression, unspecified: Secondary | ICD-10-CM | POA: Insufficient documentation

## 2021-10-20 MED ORDER — ESCITALOPRAM OXALATE 5 MG PO TABS: 5.0000 mg | ORAL_TABLET | Freq: Every day | ORAL | 0 refills | Status: DC

## 2021-10-20 MED ORDER — METFORMIN HCL 500 MG PO TABS: 500.0000 mg | ORAL_TABLET | Freq: Every day | ORAL | 0 refills | Status: DC

## 2021-10-20 NOTE — Progress Notes (Unsigned)
[facility] [place] Phone: [phone] Subjective:   Cassandra [name], am serving as a scribe for Dr. Hulan Saas.  I'm seeing this patient by the request  of:  Isaac Bliss, Rayford Halsted, MD  CC: left knee and right hip pain   BOF:BPZWCHENID  08/26/2021 Significant arthritic changes of the knee noted.  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  Patient was given viscosupplementation today because patient has had sinus problems as well.  Patient is to increase activity slowly.  Follow-up again in 6 to 8 weeks  Update 10/21/2021 Cassandra [name] is a 76 y.o. female coming in with complaint of L knee and R hip pain. Patient states that she did get some relief after 4 weeks. Does try to walk 30 minutes a day and intermittently has pain.       Past Medical History:  Diagnosis Date   Anemia    Anxiety    Asthma    Back pain    BPPV (benign paroxysmal positional vertigo)    Chronic allergic conjunctivitis    Depression    Diverticulosis    Fibromyalgia    Gallbladder problem    Gallstones    GERD (gastroesophageal reflux disease)    Hiatal hernia    History of bladder stone    Iron (Fe) deficiency anemia    Joint pain    Lump in female breast    Mild persistent asthma    followed by pcp--- dr Alfonso Patten. sharma (Peetz allergy/asthma)   OA (osteoarthritis)    knees, hands   OAB (overactive bladder)    OSA (obstructive sleep apnea)    per pt has not used cpap several years   Pancreatic cyst    PONV (postoperative nausea and vomiting)    RLS (restless legs syndrome)    Seasonal and perennial allergic rhinitis    SUI (stress urinary incontinence, female)    Swallowing difficulty    Vitamin B12 deficiency    Vitamin D deficiency    Wears glasses    Past Surgical History:  Procedure Laterality Date   BLADDER SUSPENSION   1990's   sling   bladder tacking  2010   with mesh   BREAST LUMPECTOMY Left 1985   Benign cyst   BREAST REDUCTION SURGERY Bilateral 2003 approx.   CORNEA LESION EXCISION Right 2005  approx.   CYSTOSCOPY N/A 07/12/2016   Procedure: CYSTOSCOPY, VAGINOSCOPY, EXAM UNDER ANESTHESIA, STONE LITHOTRIPSY,;  Surgeon: Cleon Gustin, MD;  Location: [facility];  Service: Urology;  Laterality: N/A;   CYSTOSCOPY N/A 10/11/2016   Procedure: CYSTOSCOPY;  Surgeon: Cleon Gustin, MD;  Location: [facility];  Service: Urology;  Laterality: N/A;   CYSTOSCOPY WITH LITHOLAPAXY N/A 10/11/2016   Procedure: CYSTOSCOPY WITH LITHOLAPAXY/ EXCISION OF MESH;  Surgeon: Cleon Gustin, MD;  Location: [facility];  Service: Urology;  Laterality: N/A;   CYSTOSCOPY WITH LITHOLAPAXY N/A 01/22/2019   Procedure: CYSTOSCOPY WITH LITHOLAPAXY;  Surgeon: Cleon Gustin, MD;  Location: [facility];  Service: Urology;  Laterality: N/A;  1 HR   HOLMIUM LASER APPLICATION  7/82/4235   Procedure: HOLMIUM LASER APPLICATION;  Surgeon: Cleon Gustin, MD;  Location: [facility];  Service: Urology;;   HOLMIUM LASER APPLICATION N/A 3/61/4431   Procedure: HOLMIUM LASER APPLICATION;  Surgeon:  McKenzie, Candee Furbish, MD;  Location: [facility];  Service: Urology;  Laterality: N/A;   RIGHT KNEE ARTHROSCOPY  2013   TOTAL KNEE ARTHROPLASTY Right 09/12/2012   Procedure: RIGHT TOTAL KNEE ARTHROPLASTY;  Surgeon: Gearlean Alf, MD;  Location: [facility];  Service: Orthopedics;  Laterality: Right;   VAGINAL HYSTERECTOMY  1984   Social History   Socioeconomic History   Marital status: Widowed    Spouse name: Not on file   Number of children: Not on file   Years of education: Not on file   Highest education level: Not on file  Occupational History   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  Tobacco Use   Smoking status: Never    Smokeless tobacco: Never  Vaping Use   Vaping Use: Never used  Substance and Sexual Activity   Alcohol use: Not Currently   Drug use: Never   Sexual activity: Not on file  Other Topics Concern   Not on file  Social History Narrative   Not on file   Social Determinants of Health   Financial Resource Strain: Not on file  Food Insecurity: Not on file  Transportation Needs: Not on file  Physical Activity: Not on file  Stress: Not on file  Social Connections: Not on file   Allergies  Allergen Reactions   Shellfish Allergy Hives   Cephalosporins Hives   Codeine     Other reaction(s): Unknown   Hydrochlorothiazide Hives   Misc. Sulfonamide Containing Compounds Hives   Penicillins Hives   Sulfa Antibiotics Hives   Sulfacetamide Sodium     Other reaction(s): Unknown   Family History  Problem Relation Age of Onset   Kidney cancer Mother    Diabetes Mother    Obesity Mother    Alcoholism Father    Colon cancer Neg Hx    Inflammatory bowel disease Neg Hx    Liver disease Neg Hx    Pancreatic cancer Neg Hx    Stomach cancer Neg Hx    Rectal cancer Neg Hx     Current Outpatient Medications (Endocrine & Metabolic):    metFORMIN (GLUCOPHAGE) 500 MG tablet, Take 1 tablet (500 mg total) by mouth daily with breakfast.  Current Outpatient Medications (Cardiovascular):    EPINEPHrine 0.3 mg/0.3 mL IJ SOAJ injection, Inject 0.3 mg into the muscle as needed for anaphylaxis.   spironolactone (ALDACTONE) 25 MG tablet, Take 1 tablet (25 mg total) by mouth daily.  Current Outpatient Medications (Respiratory):    fluticasone (FLONASE) 50 MCG/ACT nasal spray, Place into both nostrils.   levocetirizine (XYZAL) 5 MG tablet, Take 5 mg by mouth every evening.     Current Outpatient Medications (Other):    escitalopram (LEXAPRO) 5 MG tablet, Take 1 tablet (5 mg total) by mouth daily with breakfast.   gabapentin (NEURONTIN) 100 MG capsule, Take 2 capsules (200 mg total) by mouth at  bedtime.   Misc Natural Products (TART CHERRY ADVANCED) CAPS, Take 1 capsule by mouth. '1200mg'$  capsule   omeprazole (PRILOSEC) 20 MG capsule, Take 20 mg by mouth 2 (two) times daily before a meal.   psyllium (REGULOID) 0.52 g capsule, Take 0.52 g by mouth daily.   zolpidem (AMBIEN) 5 MG tablet, Take 1 tablet (5 mg total) by mouth at bedtime as needed for sleep.    Objective  Blood pressure 106/70, pulse 75, height '5\' 5"'$  (1.651 m), weight 200 lb (90.7 kg), SpO2 95 %.   General: [name] apparent distress alert and oriented x3 mood and  affect normal, dressed appropriately.  HEENT: Pupils equal, extraocular movements intact  Respiratory: Patient's speak in full sentences and does not appear short of breath  Cardiovascular: [name] lower extremity edema, non tender, [name] erythema  Gait normal with good balance and coordination.  MSK:  left knee tenderness  instability noted but patient does not have any significant swelling at the moment.  Good range of motion which is an improvement.    Impression and Recommendations:     The above documentation has been reviewed and is accurate and complete Lyndal Pulley, DO

## 2021-10-21 ENCOUNTER — Ambulatory Visit: Payer: PPO | Admitting: Family Medicine

## 2021-10-21 ENCOUNTER — Encounter: Payer: Self-pay | Admitting: Family Medicine

## 2021-10-21 DIAGNOSIS — M1712 Unilateral primary osteoarthritis, left knee: Secondary | ICD-10-CM

## 2021-10-21 NOTE — Patient Instructions (Signed)
Medial unloader L knee Happy with results See me in 3-4 months

## 2021-10-21 NOTE — Assessment & Plan Note (Signed)
Patient is doing really well with the viscosupplementation at this point.  Encouraged her to continue to stay active.  Patient given a over-the-counter medial unloader brace and we will see how patient responds.  Patient will continue to increase activity as tolerated and follow-up with me again in 3 to 4 months

## 2021-10-28 ENCOUNTER — Other Ambulatory Visit (INDEPENDENT_AMBULATORY_CARE_PROVIDER_SITE_OTHER): Payer: Self-pay | Admitting: Family Medicine

## 2021-10-28 DIAGNOSIS — G4709 Other insomnia: Secondary | ICD-10-CM

## 2021-10-29 DIAGNOSIS — J3089 Other allergic rhinitis: Secondary | ICD-10-CM | POA: Diagnosis not present

## 2021-10-29 DIAGNOSIS — J301 Allergic rhinitis due to pollen: Secondary | ICD-10-CM | POA: Diagnosis not present

## 2021-11-02 NOTE — Progress Notes (Signed)
Chief Complaint:   OBESITY Cassandra Cassandra [name] is here to discuss her progress with her obesity treatment plan along with follow-up of her obesity related diagnoses. Cassandra Cassandra [name] is on the [place] and states she is following her eating plan approximately 75% of the time. Cassandra Cassandra [name] states she is walking for 30-40 minutes 3-4 times per week.  Today's visit was #: 22 Starting weight: 193 lbs Starting date: 11/13/2019 Today's weight: 200 lbs Today's date: 10/20/2021 Total lbs lost to date: 0 Total lbs lost since last in-office visit: 0  Interim History: Cassandra Cassandra [name] is working on increasing her protein but she is still skipping meals. She is frustrated with her lack of weight loss. She is doing more of her plan versus our plans.   Subjective:   1. Insulin resistance Cassandra Cassandra [name] is struggling with weight loss. She did better with metformin in the past.   2. Hyperlipidemia, unspecified hyperlipidemia type Cassandra Cassandra [name]'s LDL is above goal, and she is not on statin. She is trying to decrease her cholesterol in diet.   3. Other depression, with emotional eating  Cassandra Cassandra [name] feels the Lexapro has helped her but her speech is a bit pressured today which she has noticed.   Assessment/Plan:   1. Insulin resistance Cassandra Cassandra [name] agreed to start metformin 500 mg q AM with no refills. Cassandra Cassandra [name] will continue to work on weight loss, exercise, and decreasing simple carbohydrates to help decrease the risk of diabetes. Cassandra Cassandra [name] agreed to follow-up with [place] as directed to closely monitor her progress.  - metFORMIN (GLUCOPHAGE) 500 MG tablet; Take 1 tablet (500 mg total) by mouth daily with breakfast.  Dispense: 30 tablet; Refill: 0  2. Hyperlipidemia, unspecified hyperlipidemia type Cassandra Cassandra [name] will continue with her diet and weight loss. She has declined medications.   3. Other depression, with emotional eating  Cassandra Cassandra [name] agreed to decrease Lexapro to 5 mg ( no refills needed). We will follow up at her next visit in 1 month.  - escitalopram (LEXAPRO) 5 MG  tablet; Take 1 tablet (5 mg total) by mouth daily with breakfast.  Dispense: 30 tablet; Refill: 0  4. Obesity, Current BMI 33.3 Cassandra Cassandra [name] is currently in the action stage of change. As such, her goal is to continue with weight loss efforts. She has agreed to the Category 2 Plan.   Exercise goals: As is.   Behavioral modification strategies: no skipping meals.  Cassandra Cassandra [name] has agreed to follow-up with our clinic in 4 weeks. She was informed of the importance of frequent follow-up visits to maximize her success with intensive lifestyle modifications for her multiple health conditions.   Objective:   Blood pressure 110/61, Cassandra [name] 83, temperature 97.6 F (36.4 C), height '5\' 5"'$  (1.651 m), weight 200 lb (90.7 kg), SpO2 97 %. Body mass index is 33.28 kg/m.  General: Cooperative, alert, well developed, in no acute distress. HEENT: Conjunctivae and lids unremarkable. Cardiovascular: Regular rhythm.  Lungs: Normal work of breathing. Neurologic: No focal deficits.   Lab Results  Component Value Date   CREATININE 0.71 07/21/2021   BUN 11 07/21/2021   NA 138 07/21/2021   K 4.6 07/21/2021   CL 103 07/21/2021   CO2 22 07/21/2021   Lab Results  Component Value Date   ALT 10 07/21/2021   AST 11 07/21/2021   ALKPHOS 89 07/21/2021   BILITOT 0.4 07/21/2021   Lab Results  Component Value Date   HGBA1C 5.6 04/17/2020   HGBA1C 5.7 (H) 11/13/2019   HGBA1C 5.4 08/29/2019   HGBA1C 6.1 01/17/2019  HGBA1C 4.9 01/06/2007   Lab Results  Component Value Date   INSULIN 24.4 07/21/2021   INSULIN 19.1 11/26/2020   INSULIN 15.9 04/17/2020   INSULIN 30.1 (H) 11/13/2019   Lab Results  Component Value Date   TSH 2.960 07/21/2021   Lab Results  Component Value Date   CHOL 185 07/21/2021   HDL 55 07/21/2021   LDLCALC 105 (H) 07/21/2021   TRIG 144 07/21/2021   CHOLHDL 3.0 11/26/2020   Lab Results  Component Value Date   VD25OH 69.4 07/21/2021   VD25OH 45.1 11/26/2020   VD25OH 127.0 (H) 04/17/2020    Lab Results  Component Value Date   WBC 7.5 07/21/2021   HGB 13.5 07/21/2021   HCT 41.4 07/21/2021   MCV 83 07/21/2021   PLT 290 07/21/2021   Lab Results  Component Value Date   IRON 56 07/21/2021   TIBC 449 07/21/2021   FERRITIN 11 (L) 07/21/2021    Obesity Behavioral Intervention:   Approximately 15 minutes were spent on the discussion below.  ASK: We discussed the diagnosis of obesity with Cassandra Cassandra [name] today and Cassandra Cassandra [name] agreed to give [place] permission to discuss obesity behavioral modification therapy today.  ASSESS: Cassandra Cassandra [name] has the diagnosis of obesity and her BMI today is 33.3. Cassandra Cassandra [name] is in the action stage of change.   ADVISE: Cassandra Cassandra [name] was educated on the multiple health risks of obesity as well as the benefit of weight loss to improve her health. She was advised of the need for long term treatment and the importance of lifestyle modifications to improve her current health and to decrease her risk of future health problems.  AGREE: Multiple dietary modification options and treatment options were discussed and Cassandra Cassandra [name] agreed to follow the recommendations documented in the above note.  ARRANGE: Cassandra Cassandra [name] was educated on the importance of frequent visits to treat obesity as outlined per CMS and USPSTF guidelines and agreed to schedule her next follow up appointment today.  Attestation Statements:   Reviewed by clinician on day of visit: allergies, medications, problem list, medical history, surgical history, family history, social history, and previous encounter notes.   I, Cassandra Cassandra [name], am acting as transcriptionist for Dennard Nip, MD.  I have reviewed the above documentation for accuracy and completeness, and I agree with the above. -  Dennard Nip, MD

## 2021-11-03 DIAGNOSIS — J301 Allergic rhinitis due to pollen: Secondary | ICD-10-CM | POA: Diagnosis not present

## 2021-11-03 DIAGNOSIS — J3081 Allergic rhinitis due to animal (cat) (dog) hair and dander: Secondary | ICD-10-CM | POA: Diagnosis not present

## 2021-11-03 DIAGNOSIS — J3089 Other allergic rhinitis: Secondary | ICD-10-CM | POA: Diagnosis not present

## 2021-11-06 DIAGNOSIS — J3089 Other allergic rhinitis: Secondary | ICD-10-CM | POA: Diagnosis not present

## 2021-11-06 DIAGNOSIS — J301 Allergic rhinitis due to pollen: Secondary | ICD-10-CM | POA: Diagnosis not present

## 2021-11-06 DIAGNOSIS — J3081 Allergic rhinitis due to animal (cat) (dog) hair and dander: Secondary | ICD-10-CM | POA: Diagnosis not present

## 2021-11-12 ENCOUNTER — Other Ambulatory Visit (INDEPENDENT_AMBULATORY_CARE_PROVIDER_SITE_OTHER): Payer: Self-pay | Admitting: Family Medicine

## 2021-11-12 DIAGNOSIS — J3081 Allergic rhinitis due to animal (cat) (dog) hair and dander: Secondary | ICD-10-CM | POA: Diagnosis not present

## 2021-11-12 DIAGNOSIS — J301 Allergic rhinitis due to pollen: Secondary | ICD-10-CM | POA: Diagnosis not present

## 2021-11-12 DIAGNOSIS — F3289 Other specified depressive episodes: Secondary | ICD-10-CM

## 2021-11-12 DIAGNOSIS — J3089 Other allergic rhinitis: Secondary | ICD-10-CM | POA: Diagnosis not present

## 2021-11-12 DIAGNOSIS — E8881 Metabolic syndrome: Secondary | ICD-10-CM

## 2021-11-17 ENCOUNTER — Ambulatory Visit (INDEPENDENT_AMBULATORY_CARE_PROVIDER_SITE_OTHER): Payer: PPO | Admitting: Family Medicine

## 2021-11-17 ENCOUNTER — Encounter (INDEPENDENT_AMBULATORY_CARE_PROVIDER_SITE_OTHER): Payer: Self-pay | Admitting: Family Medicine

## 2021-11-17 VITALS — BP 117/74 | HR 82 | Temp 98.0°F | Ht 65.0 in | Wt 198.6 lb

## 2021-11-17 DIAGNOSIS — R7303 Prediabetes: Secondary | ICD-10-CM | POA: Diagnosis not present

## 2021-11-17 DIAGNOSIS — Z6833 Body mass index (BMI) 33.0-33.9, adult: Secondary | ICD-10-CM

## 2021-11-17 DIAGNOSIS — R42 Dizziness and giddiness: Secondary | ICD-10-CM | POA: Diagnosis not present

## 2021-11-17 DIAGNOSIS — E669 Obesity, unspecified: Secondary | ICD-10-CM

## 2021-11-17 MED ORDER — METFORMIN HCL 500 MG PO TABS: 500.0000 mg | ORAL_TABLET | Freq: Every day | ORAL | 0 refills | Status: DC

## 2021-11-18 NOTE — Progress Notes (Signed)
Chief Complaint:   OBESITY Cassandra [name] is here to discuss her progress with her obesity treatment plan along with follow-up of her obesity related diagnoses. Cassandra [name] is on the Category 2 Plan and states she is following her eating plan approximately 70% of the time. Cassandra [name] states she is walking for 30 minutes 5-7 times per week.  Today's visit was #: 23 Starting weight: 193 lbs Starting date: 11/13/2019 Today's weight: 198 lbs Today's date: 11/17/2021 Total lbs lost to date: 0 Total lbs lost since last in-office visit: 2  Interim History: Cassandra [name] continues to do well with weight loss.  She is having other health issues, but she is trying to make smarter food choices.  Her exercise is limited due to vertigo.  Subjective:   1. Pre-diabetes Cassandra [name] continues to work on her diet and weight loss.  Her last A1c was 5.6.  2. Vertigo Cassandra [name] has a history of vertigo and it has worsened the past 2 weeks.  No recent illnesses.  She feels the room spinning and has been a bit nauseated.  Assessment/Plan:   1. Pre-diabetes Cassandra [name] will increase her water intake.  We will refill metformin for 1 month.  - metFORMIN (GLUCOPHAGE) 500 MG tablet; Take 1 tablet (500 mg total) by mouth daily with breakfast.  Dispense: 30 tablet; Refill: 0  2. Vertigo We will refer Cassandra [name] to ENT specialist on [facility].   - Ambulatory referral to ENT  3. Obesity, Current BMI 33.0 Cassandra [name] is currently in the action stage of change. As such, her goal is to continue with weight loss efforts. She has agreed to the Category 2 Plan.   Exercise goals: As is.   Behavioral modification strategies: decreasing liquid calories.  Cassandra [name] has agreed to follow-up with our clinic in 4 weeks. She was informed of the importance of frequent follow-up visits to maximize her success with intensive lifestyle modifications for her multiple health conditions.   Objective:   Blood pressure 117/74, [name] 82, temperature 98 F (36.7 C),  height '5\' 5"'$  (1.651 m), weight 198 lb 9.6 oz (90.1 kg), SpO2 96 %. Body mass index is 33.05 kg/m.  General: Cooperative, alert, well developed, in no acute distress. HEENT: Conjunctivae and lids unremarkable. Cardiovascular: Regular rhythm.  Lungs: Normal work of breathing. Neurologic: No focal deficits.   Lab Results  Component Value Date   CREATININE 0.71 07/21/2021   BUN 11 07/21/2021   NA 138 07/21/2021   K 4.6 07/21/2021   CL 103 07/21/2021   CO2 22 07/21/2021   Lab Results  Component Value Date   ALT 10 07/21/2021   AST 11 07/21/2021   ALKPHOS 89 07/21/2021   BILITOT 0.4 07/21/2021   Lab Results  Component Value Date   HGBA1C 5.6 04/17/2020   HGBA1C 5.7 (H) 11/13/2019   HGBA1C 5.4 08/29/2019   HGBA1C 6.1 01/17/2019   HGBA1C 4.9 01/06/2007   Lab Results  Component Value Date   INSULIN 24.4 07/21/2021   INSULIN 19.1 11/26/2020   INSULIN 15.9 04/17/2020   INSULIN 30.1 (H) 11/13/2019   Lab Results  Component Value Date   TSH 2.960 07/21/2021   Lab Results  Component Value Date   CHOL 185 07/21/2021   HDL 55 07/21/2021   LDLCALC 105 (H) 07/21/2021   TRIG 144 07/21/2021   CHOLHDL 3.0 11/26/2020   Lab Results  Component Value Date   VD25OH 69.4 07/21/2021   VD25OH 45.1 11/26/2020   VD25OH 127.0 (H) 04/17/2020   Lab  Results  Component Value Date   WBC 7.5 07/21/2021   HGB 13.5 07/21/2021   HCT 41.4 07/21/2021   MCV 83 07/21/2021   PLT 290 07/21/2021   Lab Results  Component Value Date   IRON 56 07/21/2021   TIBC 449 07/21/2021   FERRITIN 11 (L) 07/21/2021    Obesity Behavioral Intervention:   Approximately 15 minutes were spent on the discussion below.  ASK: We discussed the diagnosis of obesity with Cassandra [name] today and Annalyssa agreed to give Cassandra [name] permission to discuss obesity behavioral modification therapy today.  ASSESS: [name] has the diagnosis of obesity and her BMI today is 33.0. Cassandra [name] is in the action stage of change.   ADVISE: Cassandra [name]  was educated on the multiple health risks of obesity as well as the benefit of weight loss to improve her health. She was advised of the need for long term treatment and the importance of lifestyle modifications to improve her current health and to decrease her risk of future health problems.  AGREE: Multiple dietary modification options and treatment options were discussed and Cassandra [name] agreed to follow the recommendations documented in the above note.  ARRANGE: Cassandra [name] was educated on the importance of frequent visits to treat obesity as outlined per CMS and USPSTF guidelines and agreed to schedule her next follow up appointment today.  Attestation Statements:   Reviewed by clinician on day of visit: allergies, medications, problem list, medical history, surgical history, family history, social history, and previous encounter notes.   I, Trixie Dredge, am acting as transcriptionist for Cassandra Nip, MD.  I have reviewed the above documentation for accuracy and completeness, and I agree with the above. -  Cassandra Nip, MD

## 2021-11-19 DIAGNOSIS — J3081 Allergic rhinitis due to animal (cat) (dog) hair and dander: Secondary | ICD-10-CM | POA: Diagnosis not present

## 2021-11-19 DIAGNOSIS — J301 Allergic rhinitis due to pollen: Secondary | ICD-10-CM | POA: Diagnosis not present

## 2021-11-19 DIAGNOSIS — J3089 Other allergic rhinitis: Secondary | ICD-10-CM | POA: Diagnosis not present

## 2021-11-22 ENCOUNTER — Encounter: Payer: Self-pay | Admitting: *Deleted

## 2021-11-22 ENCOUNTER — Other Ambulatory Visit: Payer: Self-pay

## 2021-11-22 ENCOUNTER — Ambulatory Visit
Admission: EM | Admit: 2021-11-22 | Discharge: 2021-11-22 | Disposition: A | Discharge status: Home / Self Care | Payer: PPO | Attending: Nurse Practitioner | Admitting: Nurse Practitioner

## 2021-11-22 DIAGNOSIS — S40861A Insect bite (nonvenomous) of right upper arm, initial encounter: Secondary | ICD-10-CM | POA: Diagnosis not present

## 2021-11-22 DIAGNOSIS — W57XXXA Bitten or stung by nonvenomous insect and other nonvenomous arthropods, initial encounter: Secondary | ICD-10-CM

## 2021-11-22 MED ORDER — DOXYCYCLINE HYCLATE 100 MG PO TABS: 100.0000 mg | ORAL_TABLET | Freq: Two times a day (BID) | ORAL | 0 refills | Status: DC

## 2021-11-23 ENCOUNTER — Other Ambulatory Visit (INDEPENDENT_AMBULATORY_CARE_PROVIDER_SITE_OTHER): Payer: Self-pay | Admitting: Family Medicine

## 2021-11-23 DIAGNOSIS — R7303 Prediabetes: Secondary | ICD-10-CM

## 2021-11-25 DIAGNOSIS — J3081 Allergic rhinitis due to animal (cat) (dog) hair and dander: Secondary | ICD-10-CM | POA: Diagnosis not present

## 2021-11-25 DIAGNOSIS — J301 Allergic rhinitis due to pollen: Secondary | ICD-10-CM | POA: Diagnosis not present

## 2021-11-25 DIAGNOSIS — J3089 Other allergic rhinitis: Secondary | ICD-10-CM | POA: Diagnosis not present

## 2021-12-01 IMAGING — MR MR LUMBAR SPINE W/O CM
4 of 5 series · 26 of 48 positions shown · non-contrast
Comparison: None.

CLINICAL DATA: Low back pain radiating to the right leg

EXAM:
MRI LUMBAR SPINE WITHOUT CONTRAST
TECHNIQUE: Multiplanar, multisequence MR imaging of the lumbar spine was
performed. No intravenous contrast was administered.

[Series 3: T2 post-contrast · sagittal · 4.0mm · 0.53mm/px · 6 of 13 slices shown]
[im 1/13]
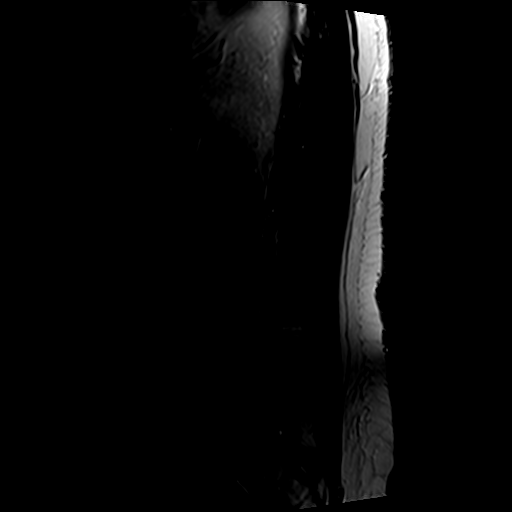
[im 3/13]
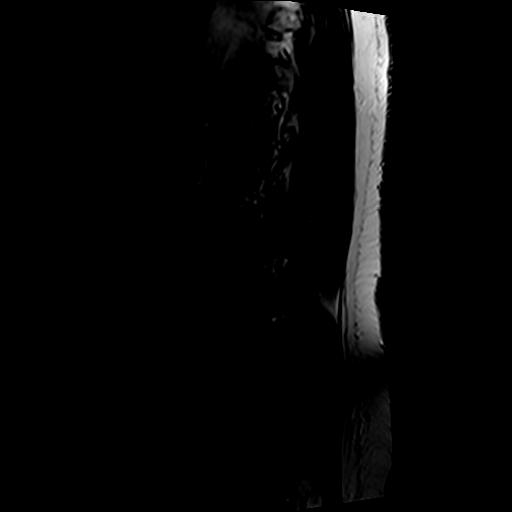
[im 5/13]
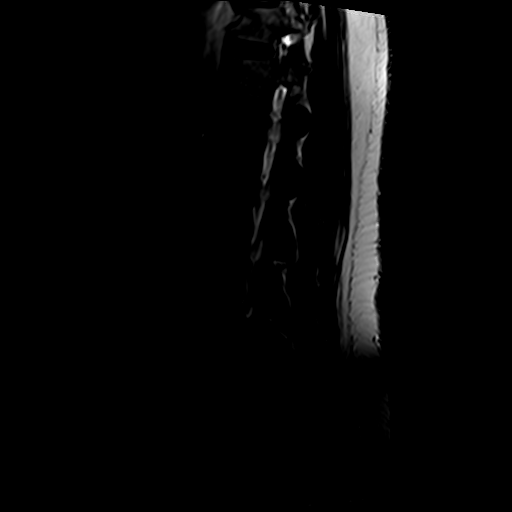
[im 8/13]
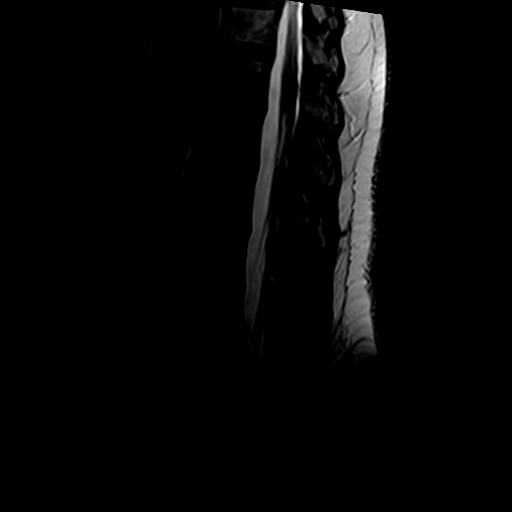
[im 10/13]
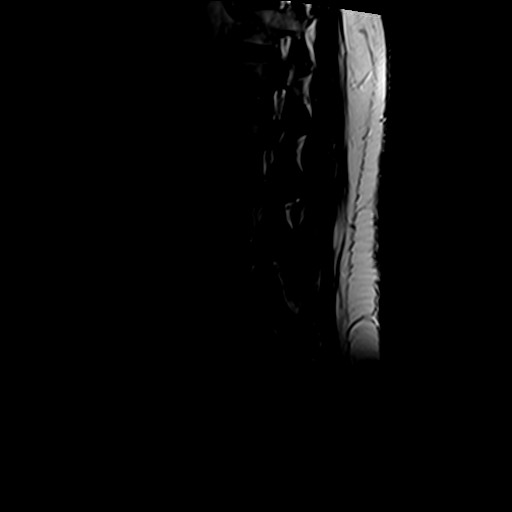
[im 13/13]
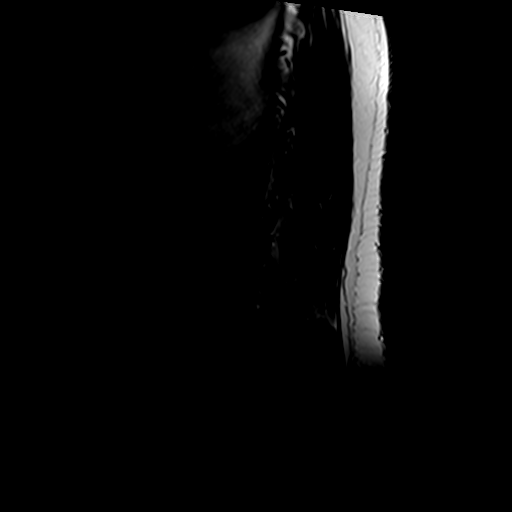

[Series 5: T1 · sagittal · 4.0mm · 0.53mm/px · 6 of 13 slices shown (1 of 2)]
[im 1/13]
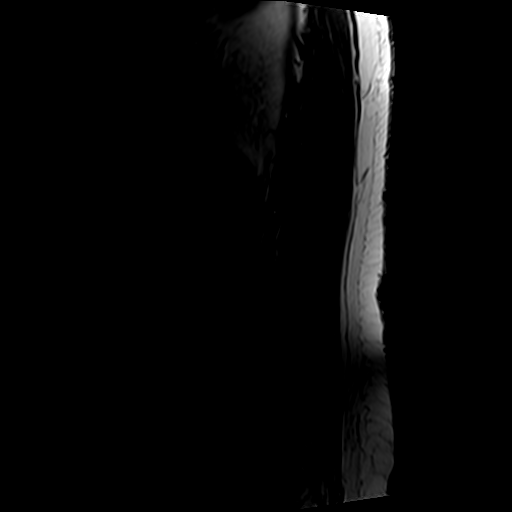
[im 3/13]
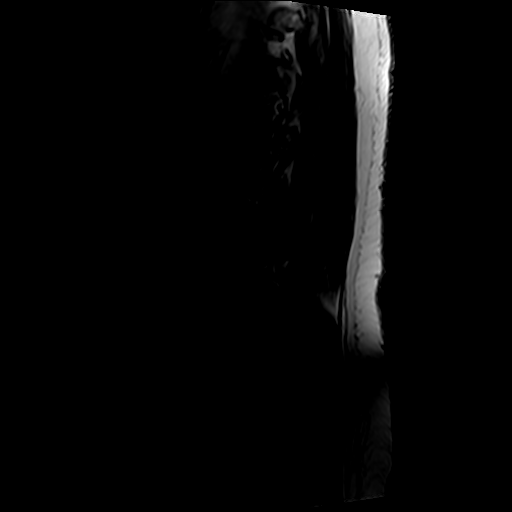
[im 5/13]
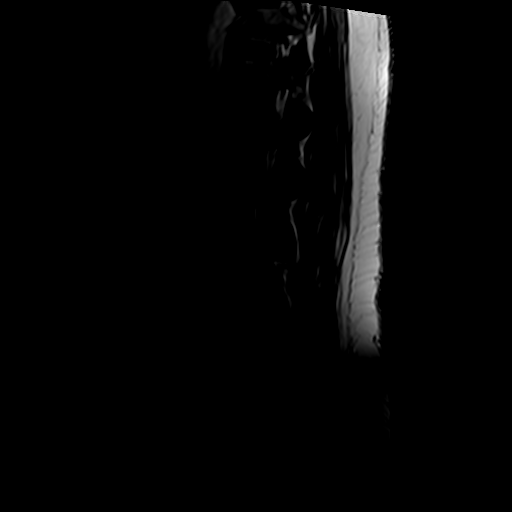
[im 8/13]
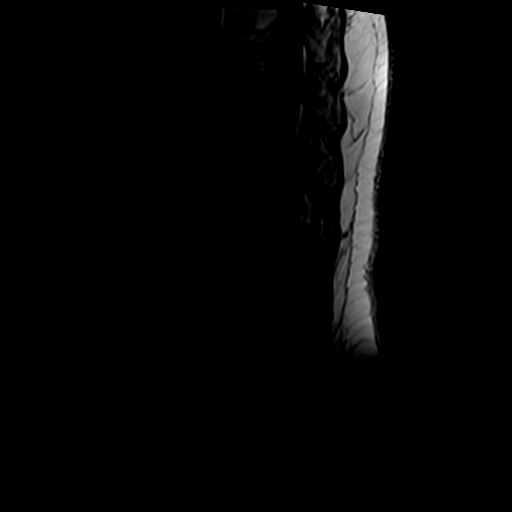
[im 10/13]
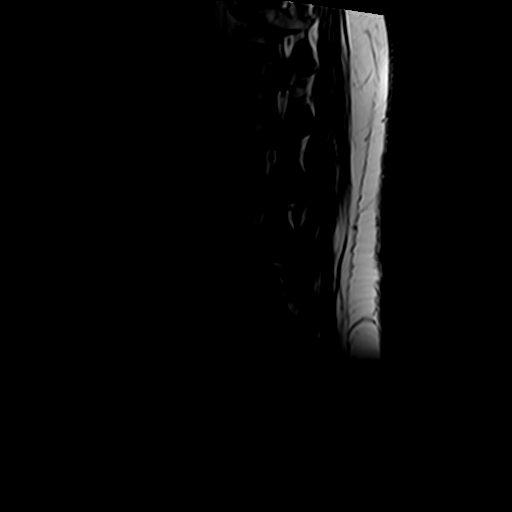
[im 13/13]
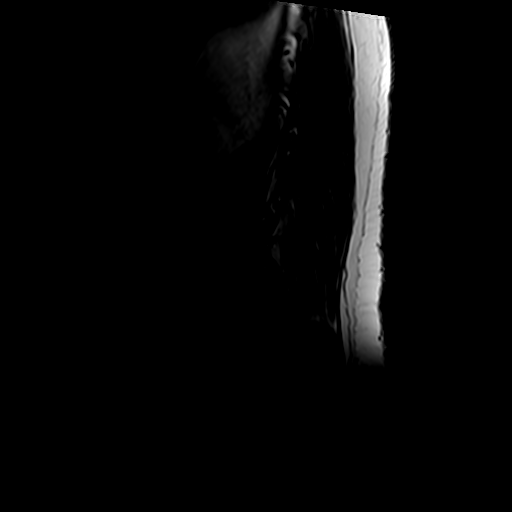

[Series 6: T2 · axial · 4.0mm · 0.70mm/px · z∈[-118,+81]mm · 9 of 34 slices shown]
[im 1/34]
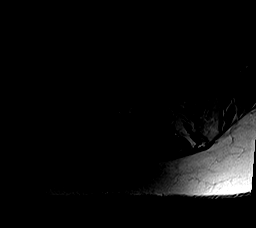
[im 5/34]
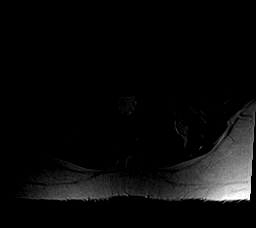
[im 10/34]
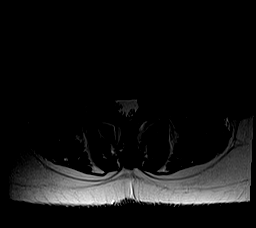
[im 15/34]
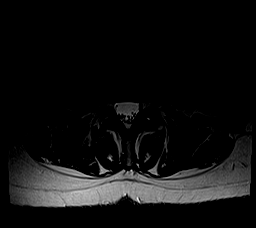
[im 17/34]
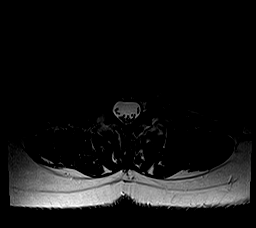
[im 19/34]
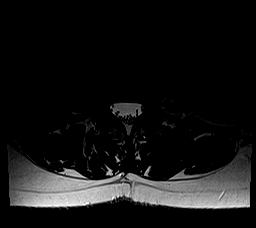
[im 24/34]
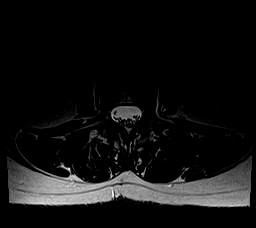
[im 29/34]
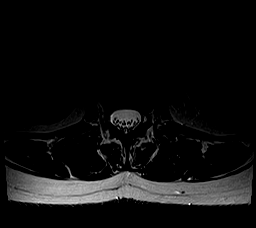
[im 34/34]
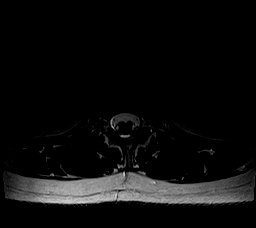

[Series 7: T1 · axial · 4.0mm · 0.35mm/px · z∈[-118,+55]mm · 5 of 34 slices shown (2 of 2)]
[im 1/34]
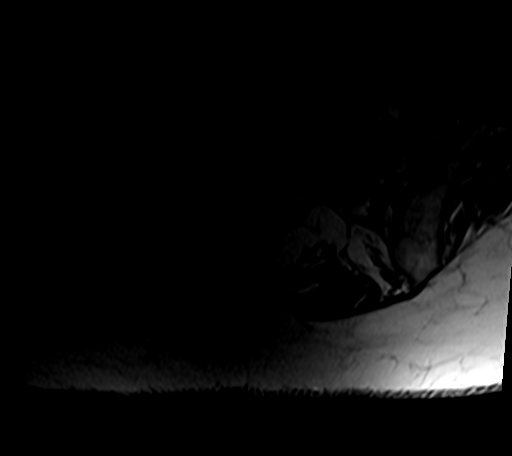
[im 5/34]
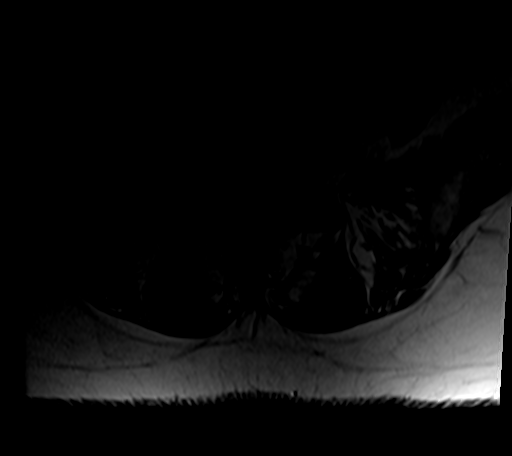
[im 10/34]
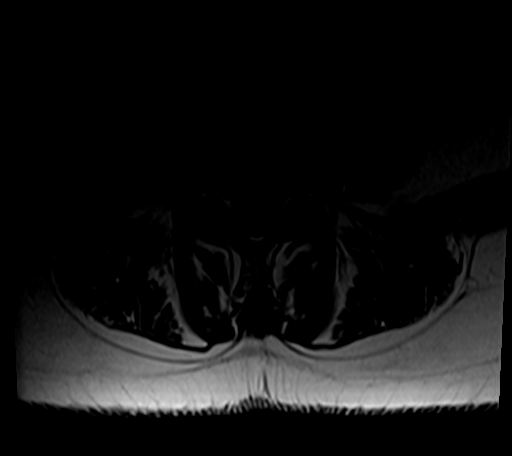
[im 17/34]
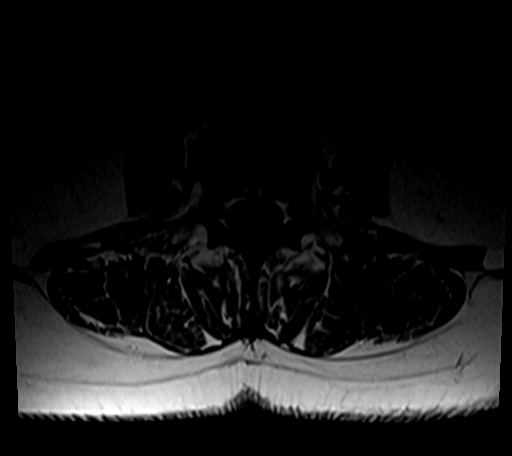
[im 29/34]
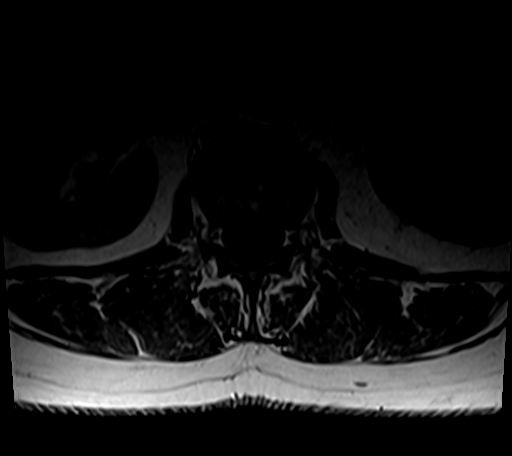

[26 of 48 positions shown; findings below may reference images not displayed]

FINDINGS: Segmentation:  Standard

Alignment:  Grade 1 retrolisthesis at L5-S1

Vertebrae:  No fracture, evidence of discitis, or bone lesion.

Conus medullaris and cauda equina: Conus extends to the L1 level.
Conus and cauda equina appear normal.

Paraspinal and other soft tissues: Negative

Disc levels:

The disc levels above L4 are normal.

L4-5: Small right subarticular disc protrusion in close proximity to
the descending right L5 nerve root in the lateral recess. No central
spinal canal or neural foraminal stenosis.

L5-S1: Disc space narrowing with endplate spurring and small bulge.
No spinal canal stenosis. Moderate right and mild left neural
foraminal stenosis.
IMPRESSION: 1. L5-S1 moderate right and mild left neural foraminal stenosis.
2. L4-L5 right subarticular disc protrusion in close proximity to
the descending right L5 nerve root in the lateral recess. Correlate
for right L5 radiculopathy.

## 2021-12-03 DIAGNOSIS — J3089 Other allergic rhinitis: Secondary | ICD-10-CM | POA: Diagnosis not present

## 2021-12-03 DIAGNOSIS — J3081 Allergic rhinitis due to animal (cat) (dog) hair and dander: Secondary | ICD-10-CM | POA: Diagnosis not present

## 2021-12-03 DIAGNOSIS — J301 Allergic rhinitis due to pollen: Secondary | ICD-10-CM | POA: Diagnosis not present

## 2021-12-08 DIAGNOSIS — J301 Allergic rhinitis due to pollen: Secondary | ICD-10-CM | POA: Diagnosis not present

## 2021-12-08 DIAGNOSIS — J3089 Other allergic rhinitis: Secondary | ICD-10-CM | POA: Diagnosis not present

## 2021-12-08 DIAGNOSIS — J3081 Allergic rhinitis due to animal (cat) (dog) hair and dander: Secondary | ICD-10-CM | POA: Diagnosis not present

## 2021-12-15 ENCOUNTER — Other Ambulatory Visit (INDEPENDENT_AMBULATORY_CARE_PROVIDER_SITE_OTHER): Payer: Self-pay | Admitting: Family Medicine

## 2021-12-15 ENCOUNTER — Other Ambulatory Visit: Payer: PPO | Admitting: Internal Medicine

## 2021-12-15 ENCOUNTER — Encounter: Payer: Self-pay | Admitting: Neurology

## 2021-12-15 ENCOUNTER — Ambulatory Visit (INDEPENDENT_AMBULATORY_CARE_PROVIDER_SITE_OTHER): Payer: PPO | Admitting: Family Medicine

## 2021-12-15 ENCOUNTER — Encounter (INDEPENDENT_AMBULATORY_CARE_PROVIDER_SITE_OTHER): Payer: Self-pay | Admitting: Family Medicine

## 2021-12-15 ENCOUNTER — Ambulatory Visit: Payer: PPO | Admitting: Neurology

## 2021-12-15 ENCOUNTER — Other Ambulatory Visit: Payer: Self-pay | Admitting: Family Medicine

## 2021-12-15 VITALS — BP 132/74 | HR 76 | Ht 65.5 in | Wt 203.0 lb

## 2021-12-15 VITALS — BP 108/66 | HR 73 | Temp 97.8°F | Ht 65.0 in | Wt 200.0 lb

## 2021-12-15 DIAGNOSIS — E6609 Other obesity due to excess calories: Secondary | ICD-10-CM | POA: Insufficient documentation

## 2021-12-15 DIAGNOSIS — Z8669 Personal history of other diseases of the nervous system and sense organs: Secondary | ICD-10-CM | POA: Diagnosis not present

## 2021-12-15 DIAGNOSIS — Z6833 Body mass index (BMI) 33.0-33.9, adult: Secondary | ICD-10-CM

## 2021-12-15 DIAGNOSIS — F5104 Psychophysiologic insomnia: Secondary | ICD-10-CM

## 2021-12-15 DIAGNOSIS — R5383 Other fatigue: Secondary | ICD-10-CM | POA: Diagnosis not present

## 2021-12-15 DIAGNOSIS — F3289 Other specified depressive episodes: Secondary | ICD-10-CM

## 2021-12-15 DIAGNOSIS — Z72821 Inadequate sleep hygiene: Secondary | ICD-10-CM | POA: Insufficient documentation

## 2021-12-15 DIAGNOSIS — F419 Anxiety disorder, unspecified: Secondary | ICD-10-CM | POA: Insufficient documentation

## 2021-12-15 DIAGNOSIS — E669 Obesity, unspecified: Secondary | ICD-10-CM | POA: Diagnosis not present

## 2021-12-15 DIAGNOSIS — R42 Dizziness and giddiness: Secondary | ICD-10-CM

## 2021-12-15 MED ORDER — TRAZODONE HCL 50 MG PO TABS: 50.0000 mg | ORAL_TABLET | Freq: Every evening | ORAL | 1 refills | Status: DC | PRN

## 2021-12-15 NOTE — Progress Notes (Signed)
SLEEP MEDICINE CLINIC    Provider:  Larey Seat, MD  Primary Care Physician:  Isaac Bliss, Rayford Halsted, MD [place] [address]     Referring Provider: Starlyn Skeans, Md [address] [place],  [place] [id number]          Chief Complaint according to patient   Patient presents with:     New Patient (Initial Visit)     Rm 11, alone. Here for sleep consult due to frequent nighttime wakings, does not wake refreshed, insomnia, and obesity. Last SS more than 59yr ago, no current cpap use. Has taken [place] Pt states she does not want to start CPAP and would like to discuss other options. DHYW:VPXTGGResmed 9S.       HISTORY OF PRESENT ILLNESS:   12-15-2021:  Cassandra GOTTSCHis a 76y.o.  patient seen here as a referral on 12/15/2021 from Dr BLeafy Rofor a new evaluation of sleep disorders. .  Chief concern according to patient : " I had a sleep study with Dr Cassandra [name] 2009 and was not happy with CPAP, tried different masks. " I still have a Resmed S 9. I have insomnia, I ma very hyper and I need a restfull night of sleep'". My smart watch has recorded 6 hours of sleep, but I know I didn't sleep"     Document Description: Sleep Study  Order Summary: Polysomnography Report/[facility]  [facility]ysomn[facility] Report/[facility] D[facility]etonBy: LJerrye NobleD'jimraou 04/17/2008 16:51:49     KOlevia Percheshas a past medical history of Anemia, hyperactivity, Anxiety- grief, Asthma, Back pain, BPPV (benign paroxysmal positional vertigo), Chronic allergic conjunctivitis, Depression, Diverticulosis, Fibromyalgia, Gallbladder problem, Gallstones, GERD (gastroesophageal reflux disease), Hiatal hernia, History of bladder stone, Iron (Fe) deficiency anemia, Joint pain, Lump in female breast, Mild persistent asthma, OA (osteoarthritis), Tick bite, OAB (overactive bladder), OSA (obstructive sleep apnea), Pancreatic cyst, 2020, PONV  (postoperative nausea and vomiting), RLS (restless legs syndrome), Seasonal and perennial allergic rhinitis, SUI (stress urinary incontinence, female), Swallowing difficulty, Vitamin B12 deficiency, Vertigo, hearing loss, Vitamin D deficiency, and Wears glasses.   The patient had the first sleep study in the year 2009  with a result of borderline apnea.    Sleep relevant medical history: Nocturia/ 3 times, Insomnia- ENT dx a small airway- snoring. Allergic rhinitis, in treatment. URI.    Family medical /sleep history: no other family member on CPAP with OSA, mother had allergies, asthma, dementia.   Social history:  Patient was working as mTeacher, early years/pre and lives in a household with alone. Daughter in [place], son in [place].  Family status is widowed in 2012.  The patient currently used to travel for work.  Tobacco use: none.  ETOH use ; one a month, quit 2020.  Caffeine intake in form of Coffee( /) Soda( /) Tea ( 1-2 week) body armor  energy drinks. Regular exercise in form of walking.     Sleep habits are as follows: The patient's dinner time is between variable - skips meal 2-3 PM.  The patient goes to bed at 10 PM and goes to sleep for 11-12 hours, watches TV in the bedroom- dozing, wakes for 2-3 bathroom breaks.    The preferred sleep position is prone, side, with the support of 1-2 pillows.  Dreams are reportedly rare.   9 AM is the usual rise time. The patient wakes up spontaneously/ at 7.  96 .  She stays in bed, feels not motivated to get up, to eat, to dress.  She reports not feeling refreshed or restored in AM, with symptoms such as dry mouth, morning headaches, allergies and residual fatigue.  Naps are taken infrequently, lasting from 2.30 Pm  to 4 PM  ( she claims to sleep only 20-30 minutes) and are more refreshing than nocturnal sleep.    Review of Systems: Out of a complete 14 system review, the patient complains of only the following symptoms, and all other  reviewed systems are negative.:  Fatigue, sleepiness , snoring, fragmented sleep, Insomnia - poor sleep hygiene,  Racing mind, weight gain. No Apetite, irregular meals , variable sleep habits.    How likely are you to doze in the following situations: 0 = not likely, 1 = slight chance, 2 = moderate chance, 3 = high chance   Sitting and Reading? Watching Television? Sitting inactive in a public place (theater or meeting)? As a passenger in a car for an hour without a break? Lying down in the afternoon when circumstances permit? Sitting and talking to someone? Sitting quietly after lunch without alcohol? In a car, while stopped for a few minutes in traffic?   Total = 2/ 24 points   FSS endorsed at 44/ 63 points.  GDS 6/ 15   Social History   Socioeconomic History   Marital status: Widowed    Spouse name: Not on file   Number of children: 2   Years of education: Not on file   Highest education level: Bachelor's degree (e.g., BA, AB, BS)  Occupational History   Occupation: Biochemist, clinical Retired  Tobacco Use   Smoking status: Never   Smokeless tobacco: Never  Vaping Use   Vaping Use: Never used  Substance and Sexual Activity   Alcohol use: Yes    Comment: 1x monthly   Drug use: Never   Sexual activity: Not on file  Other Topics Concern   Not on file  Social History Narrative   Lives alone   R handed   Caffeine: 1 C a day   Social Determinants of Radio broadcast assistant Strain: Not on file  Food Insecurity: Not on file  Transportation Needs: Not on file  Physical Activity: Not on file  Stress: Not on file  Social Connections: Not on file    Family History  Problem Relation Age of Onset   Kidney cancer Mother    Diabetes Mother    Obesity Mother    Dementia Mother        died age 63   Other Father        burn victum   Alcoholism Father    Colon cancer Neg Hx    Inflammatory bowel disease Neg Hx    Liver disease Neg Hx    Pancreatic  cancer Neg Hx    Stomach cancer Neg Hx    Rectal cancer Neg Hx     Past Medical History:  Diagnosis Date   Anemia    Anxiety    Asthma    Back pain    BPPV (benign paroxysmal positional vertigo)    Chronic allergic conjunctivitis    Depression    Diverticulosis    Fibromyalgia    Gallbladder problem    Gallstones    GERD (gastroesophageal reflux disease)    Hiatal hernia    History of bladder stone    Iron (Fe) deficiency anemia    Joint pain  Lump in female breast    Mild persistent asthma    followed by pcp--- dr r. sharma ([place] allergy/asthma)   OA (osteoarthritis)    knees, hands   OAB (overactive bladder)    OSA (obstructive sleep apnea)    per pt has not used cpap several years   Pancreatic cyst    PONV (postoperative nausea and vomiting)    RLS (restless legs syndrome)    Seasonal and perennial allergic rhinitis    SUI (stress urinary incontinence, female)    Swallowing difficulty    Vitamin B12 deficiency    Vitamin D deficiency    Wears glasses     Past Surgical History:  Procedure Laterality Date   BLADDER SUSPENSION  1990's   sling   bladder tacking  2010   with mesh   BREAST LUMPECTOMY Left 1985   Benign cyst   BREAST REDUCTION SURGERY Bilateral 2003 approx.   CORNEA LESION EXCISION Right 2005  approx.   CYSTOSCOPY N/A 07/12/2016   Procedure: CYSTOSCOPY, VAGINOSCOPY, EXAM UNDER ANESTHESIA, STONE LITHOTRIPSY,;  Surgeon: Cleon Gustin, MD;  Location: [facility];  Service: Urology;  Laterality: N/A;   CYSTOSCOPY N/A 10/11/2016   Procedure: CYSTOSCOPY;  Surgeon: Cleon Gustin, MD;  Location: [facility];  Service: Urology;  Laterality: N/A;   CYSTOSCOPY WITH LITHOLAPAXY N/A 10/11/2016   Procedure: CYSTOSCOPY WITH LITHOLAPAXY/ EXCISION OF MESH;  Surgeon: Cleon Gustin, MD;  Location: [facility];  Service: Urology;  Laterality: N/A;   CYSTOSCOPY WITH LITHOLAPAXY N/A 01/22/2019    Procedure: CYSTOSCOPY WITH LITHOLAPAXY;  Surgeon: Cleon Gustin, MD;  Location[facility];  Service: Urology;  Laterality: N/A;  1 HR   HOLMIUM LASER APPLICATION  11/28/1599   Procedure: HOLMIUM LASER APPLICATION;  Surgeon: Cleon Gustin, MD;  Location: [facility];  Service: Urology;;   HOLMIUM LASER APPLICATION N/A 0/93/2355   Procedure: HOLMIUM LASER APPLICATION;  Surgeon: Cleon Gustin, MD;  Location: [facility];  Service: Urology;  Laterality: N/A;   RIGHT KNEE ARTHROSCOPY  2013   TOTAL KNEE ARTHROPLASTY Right 09/12/2012   Procedure: RIGHT TOTAL KNEE ARTHROPLASTY;  Surgeon: Gearlean Alf, MD;  Location: [facility];  Service: Orthopedics;  Laterality: Right;   VAGINAL HYSTERECTOMY  1984     Current Outpatient Medications on File Prior to Visit  Medication Sig Dispense Refill   EPINEPHrine 0.3 mg/0.3 mL IJ SOAJ injection Inject 0.3 mg into the muscle as needed for anaphylaxis.     escitalopram (LEXAPRO) 5 MG tablet Take 1 tablet (5 mg total) by mouth daily with breakfast. 30 tablet 0   fluticasone (FLONASE) 50 MCG/ACT nasal spray Place into both nostrils.     gabapentin (NEURONTIN) 100 MG capsule Take 2 capsules (200 mg total) by mouth at bedtime. 180 capsule 0   levocetirizine (XYZAL) 5 MG tablet Take 5 mg by mouth every evening.   1   metFORMIN (GLUCOPHAGE) 500 MG tablet Take 1 tablet (500 mg total) by mouth daily with breakfast. 30 tablet 0   Misc Natural Products (TART CHERRY ADVANCED) CAPS Take 1 capsule by mouth. '1200mg'$  capsule     omeprazole (PRILOSEC) 20 MG capsule Take 20 mg by mouth 2 (two) times daily before a meal.     psyllium (REGULOID) 0.52 g capsule Take 0.52 g by mouth daily.     spironolactone (ALDACTONE) 25 MG tablet Take 1 tablet (25 mg total) by mouth daily. [place]  tablet 1   No current facility-administered medications on file prior to visit.    Allergies  Allergen Reactions   Shellfish Allergy Hives    Cephalosporins Hives   Codeine     Other reaction(s): Unknown   Hydrochlorothiazide Hives   Misc. Sulfonamide Containing Compounds Hives   Penicillins Hives   Sulfa Antibiotics Hives   Sulfacetamide Sodium     Other reaction(s): Unknown    Physical exam:  Today's Vitals   12/15/21 1250  BP: 132/74  Pulse: 76  Weight: 203 lb (92.1 kg)  Height: 5' 5.5" (1.664 m)   Body mass index is 33.27 kg/m.   Wt Readings from Last 3 Encounters:  12/15/21 203 lb (92.1 kg)  12/15/21 200 lb (90.7 kg)  11/17/21 198 lb 9.6 oz (90.1 kg)     Ht Readings from Last 3 Encounters:  12/15/21 5' 5.5" (1.664 m)  12/15/21 '5\' 5"'$  (1.651 m)  11/17/21 '5\' 5"'$  (1.651 m)      General: The patient is awake, alert and appears not in acute distress. The patient is well groomed. Head: Normocephalic, atraumatic. Neck is supple. Mallampati 1,  neck circumference:15.5 inches . Nasal airflow not fully patent.  Retrognathia is mild seen.  postnasal drip-  Dental status: lower jaw is crowded.   Cardiovascular:  Regular rate and cardiac rhythm by pulse,  without distended neck veins. Respiratory: Lungs are clear to auscultation.  Skin:  Without evidence of ankle edema, or rash. Trunk: The patient's posture is erect.   Neurologic exam : The patient is awake and alert, oriented to place and time.   Memory subjective described as intact.  Attention span & concentration ability appears normal.  Speech is fluent,  without  dysarthria, dysphonia or aphasia.  Mood and affect are inappropriate pressured, impulsive, logorrhea.    Cranial nerves: no loss of smell or taste reported  Pupils are equal and briskly reactive to light. Funduscopic exam deferred. .  Extraocular movements in vertical and horizontal planes were intact and without nystagmus. No Diplopia. Visual fields by finger perimetry are intact. Hearing was impaired   Facial sensation intact to fine touch.  Facial motor strength is symmetric and tongue and  uvula move midline.  Neck ROM : rotation, tilt and flexion extension were normal for age and shoulder shrug was symmetrical.    Motor exam:  Symmetric bulk, tone and ROM.   Normal tone without cog- wheeling, symmetric grip strength .   Sensory:  Fine touch and vibration were normal.  Proprioception tested in the upper extremities was normal.   Coordination: Rapid alternating movements in the fingers/hands were of normal speed.  The Finger-to-nose maneuver was intact without evidence of ataxia, dysmetria or tremor.   Gait and station: Patient could rise unassisted from a seated position, walked without assistive device.  Stance is of normal width/ base and the patient turned with 3 steps.  Toe and heel walk were deferred.  Deep tendon reflexes: in the  upper and lower extremities are symmetric and intact.  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 TV is a  way to create a sound scape. She is afraid of dementia. Mind is racing, she is probably severely hyperactive-? ADHD.  2) history of OSA, unknown degree - she is not sure if she has it now.  3) hopes to find help with weight loss when sleep is regulated, she feels she never sleeps enough and sleep perception is that of poor sleep. No hypersomnia, more fatigue.  4) dry mouth.    My Plan is to proceed with:  1)Lets screen for sleep apnea - it may be only a mild form that will not require CPAP. She has been noted to be breathing loudly, especially in allergy season.  2) trazodone for sleep Maralyn Sago - this is also not fostering  dementia. She take OTC anticholinergic medications sometimes with paradox effect.  Failed restoril, ambien, xanax- OTC benadryl, citrizine, relaxium,  3) may refer to CBT- anxiety and depression treatment with Psychology.  4) set a rise time, set a bed time, hot bath before bed, audio-book or medication tapes ( Calm). Booklet given to patient.   I would like to thank Isaac Bliss, Rayford Halsted, MD and Dennard Nip D, [place],  [place] [phone] for allowing me to meet with and to take care of this pleasant patient.   In short, Cassandra [name] is presenting with hyperactivity, impulsivity, and insomnia,. I plan to follow up either personally or through our NP within 2-3 months.   CC: I will share my notes with PCP.  Electronically signed by: Larey Seat, MD 12/15/2021 1:17 PM  [facility] Board certified by The [employer] of Sleep Medicine and Diplomate of the [employer] of Sleep Medicine. Board certified In Neurology through the [place], Fellow of the [employer] of Neurology. Medical Director of [facility].

## 2021-12-15 NOTE — Patient Instructions (Signed)
Quality Sleep Information, Adult ?Quality sleep is important for your mental and physical health. It also improves your quality of life. Quality sleep means you: ?Are asleep for most of the time you are in bed. ?Fall asleep within 30 minutes. ?Wake up no more than once a night.  ?Are awake for no longer than 20 minutes if you do wake up during the night. ?Most adults need 7-8 hours of quality sleep each night. ?How can poor sleep affect me? ?If you do not get enough quality sleep, you may have: ?Mood swings. ?Daytime sleepiness. ?Confusion. ?Decreased reaction time. ?Sleep disorders, such as insomnia and sleep apnea. ?Difficulty with: ?Solving problems. ?Coping with stress. ?Paying attention. ?These issues may affect your performance and productivity at work, school, and at home. Lack of sleep may also put you at higher risk for accidents, suicide, and risky behaviors. ?If you do not get quality sleep you may also be at higher risk for several health problems, including: ?Infections. ?Type 2 diabetes. ?Heart disease. ?High blood pressure. ?Obesity. ?Worsening of long-term conditions, like arthritis, kidney disease, depression, Parkinson's disease, and epilepsy. ?What actions can I take to get more quality sleep? ? ?  ? ?Stick to a sleep schedule. Go to sleep and wake up at about the same time each day. Do not try to sleep less on weekdays and make up for lost sleep on weekends. This does not work. ?Try to get about 30 minutes of exercise on most days. Do not exercise 2-3 hours before going to bed. ?Limit naps during the day to 30 minutes or less. ?Do not use any products that contain nicotine or tobacco, such as cigarettes or e-cigarettes. If you need help quitting, ask your health care provider. ?Do not drink caffeinated beverages for at least 8 hours before going to bed. Coffee, tea, and some sodas contain caffeine. ?Do not drink alcohol close to bedtime. ?Do not eat large meals close to bedtime. ?Do not take naps  in the late afternoon. ?Try to get at least 30 minutes of sunlight every day. Morning sunlight is best. ?Make time to relax before bed. Reading, listening to music, or taking a hot bath promotes quality sleep. ?Make your bedroom a place that promotes quality sleep. Keep your bedroom dark, quiet, and at a comfortable room temperature. Make sure your bed is comfortable. Take out sleep distractions like TV, a computer, smartphone, and bright lights. ?If you are lying awake in bed for longer than 20 minutes, get up and do a relaxing activity until you feel sleepy. ?Work with your health care provider to treat medical conditions that may affect sleeping, such as: ?Nasal obstruction. ?Snoring. ?Sleep apnea and other sleep disorders. ?Talk to your health care provider if you think any of your prescription medicines may cause you to have difficulty falling or staying asleep. ?If you have sleep problems, talk with a sleep consultant. If you think you have a sleep disorder, talk with your health care provider about getting evaluated by a specialist. ?Where to find more information ?National Sleep Foundation website: https://sleepfoundation.org ?National Heart, Lung, and Blood Institute (NHLBI): www.nhlbi.nih.gov/files/docs/public/sleep/healthy_sleep.pdf ?Centers for Disease Control and Prevention (CDC): www.cdc.gov/sleep/index.html ?Contact a health care provider if you: ?Have trouble getting to sleep or staying asleep. ?Often wake up very early in the morning and cannot get back to sleep. ?Have daytime sleepiness. ?Have daytime sleep attacks of suddenly falling asleep and sudden muscle weakness (narcolepsy). ?Have a tingling sensation in your legs with a strong urge to move your   legs (restless legs syndrome). ?Stop breathing briefly during sleep (sleep apnea). ?Think you have a sleep disorder or are taking a medicine that is affecting your quality of sleep. ?Summary ?Most adults need 7-8 hours of quality sleep each  night. ?Getting enough quality sleep is an important part of health and well-being. ?Make your bedroom a place that promotes quality sleep and avoid things that may cause you to have poor sleep, such as alcohol, caffeine, smoking, and large meals. ?Talk to your health care provider if you have trouble falling asleep or staying asleep. ?This information is not intended to replace advice given to you by your health care provider. Make sure you discuss any questions you have with your health care provider. ?Document Revised: 06/22/2021 Document Reviewed: 03/16/2021 ?Elsevier Patient Education ? 2023 Elsevier Inc. ?Screening for Sleep Apnea ? ?Sleep apnea is a condition in which breathing pauses or becomes shallow during sleep. Sleep apnea screening is a test to determine if you are at risk for sleep apnea. The test includes a series of questions. It will only takes a few minutes. Your health care provider may ask you to have this test in preparation for surgery or as part of a physical exam. ?What are the symptoms of sleep apnea? ?Common symptoms of sleep apnea include: ?Snoring. ?Waking up often at night. ?Daytime sleepiness. ?Pauses in breathing. ?Choking or gasping during sleep. ?Irritability. ?Forgetfulness. ?Trouble thinking clearly. ?Depression. ?Personality changes. ?Most people with sleep apnea do not know that they have it. ?What are the advantages of sleep apnea screening? ?Getting screened for sleep apnea can help: ?Ensure your safety. It is important for your health care providers to know whether or not you have sleep apnea, especially if you are having surgery or have other long-term (chronic) health conditions. ?Improve your health and allow you to get a better night's rest. Restful sleep can help you: ?Have more energy. ?Lose weight. ?Improve high blood pressure. ?Improve diabetes management. ?Prevent stroke. ?Prevent car accidents. ?What happens during the screening? ?Screening usually includes being  asked a list of questions about your sleep quality. Some questions you may be asked include: ?Do you snore? ?Is your sleep restless? ?Do you have daytime sleepiness? ?Has a partner or spouse told you that you stop breathing during sleep? ?Have you had trouble concentrating or memory loss? ?What is your age? ?What is your neck circumference? ?To measure your neck, keep your back straight and gently wrap the tape measure around your neck. Put the tape measure at the middle of your neck, between your chin and collarbone. ?What is your sex assigned at birth? ?Do you have or are you being treated for high blood pressure? ?If your screening test is positive, you are at risk for the condition. Further testing may be needed to confirm a diagnosis of sleep apnea. ?Where to find more information ?You can find screening tools online or at your health care clinic. For more information about sleep apnea screening and healthy sleep, visit these websites: ?Centers for Disease Control and Prevention: www.cdc.gov ?American Sleep Apnea Association: www.sleepapnea.org ?Contact a health care provider if: ?You think that you may have sleep apnea. ?Summary ?Sleep apnea screening can help determine if you are at risk for sleep apnea. ?It is important for your health care providers to know whether or not you have sleep apnea, especially if you are having surgery or have other chronic health conditions. ?You may be asked to take a screening test for sleep apnea in preparation for surgery   or as part of a physical exam. ?This information is not intended to replace advice given to you by your health care provider. Make sure you discuss any questions you have with your health care provider. ?Document Revised: 04/25/2020 Document Reviewed: 04/25/2020 ?Elsevier Patient Education ? 2023 Elsevier Inc. ? ?

## 2021-12-17 DIAGNOSIS — J301 Allergic rhinitis due to pollen: Secondary | ICD-10-CM | POA: Diagnosis not present

## 2021-12-17 DIAGNOSIS — J3081 Allergic rhinitis due to animal (cat) (dog) hair and dander: Secondary | ICD-10-CM | POA: Diagnosis not present

## 2021-12-17 DIAGNOSIS — J3089 Other allergic rhinitis: Secondary | ICD-10-CM | POA: Diagnosis not present

## 2021-12-21 ENCOUNTER — Telehealth: Payer: Self-pay | Admitting: Neurology

## 2021-12-21 NOTE — Telephone Encounter (Signed)
HTA pending faxed notes 

## 2021-12-21 NOTE — Progress Notes (Signed)
Chief Complaint:   OBESITY Cassandra [name] is here to discuss her progress with her obesity treatment plan along with follow-up of her obesity related diagnoses. Cassandra [name] is on the Category 2 Plan and states she is following her eating plan approximately 70% of the time. [name] states she is doing 0 minutes 0 times per week.  Today's visit was #: 24 Starting weight: 193 lbs Starting date: 11/13/2019 Today's weight: 200 lbs Today's date: 12/15/2021 Total lbs lost to date: 0 Total lbs lost since last in-office visit: 0  Interim History: Cassandra [name] is struggling with weight loss.  She has done some celebration eating and more eating out, but she has retaining some fluid as well.  She is open to looking at other eating plans.  Subjective:   1. Other fatigue Cassandra [name] is not sleeping well, and she has an appointment with her sleep doctor as she cannot tolerate her CPAP.  She is not eating all of her protein and this could be contributing to her fatigue.  Assessment/Plan:   1. Other fatigue Cassandra [name] is to follow-up with her sleep doctor, but she is to work on increasing her protein and we will follow-up.  2. Obesity, Current BMI 33.4 Cassandra [name] is currently in the action stage of change. As such, her goal is to continue with weight loss efforts. She has agreed to the Category 1 Plan or the [place].   Behavioral modification strategies: increasing water intake.  Cassandra [name] has agreed to follow-up with our clinic in 4 weeks. She was informed of the importance of frequent follow-up visits to maximize her success with intensive lifestyle modifications for her multiple health conditions.   Objective:   Blood pressure 108/66, pulse 73, temperature 97.8 F (36.6 C), height '5\' 5"'$  (1.651 m), weight 200 lb (90.7 kg), SpO2 99 %. Body mass index is 33.28 kg/m.  General: Cooperative, alert, well developed, in no acute distress. HEENT: Conjunctivae and lids unremarkable. Cardiovascular: Regular rhythm.  Lungs:  Normal work of breathing. Neurologic: No focal deficits.   Lab Results  Component Value Date   CREATININE 0.71 07/21/2021   BUN 11 07/21/2021   NA 138 07/21/2021   K 4.6 07/21/2021   CL 103 07/21/2021   CO2 22 07/21/2021   Lab Results  Component Value Date   ALT 10 07/21/2021   AST 11 07/21/2021   ALKPHOS 89 07/21/2021   BILITOT 0.4 07/21/2021   Lab Results  Component Value Date   HGBA1C 5.6 04/17/2020   HGBA1C 5.7 (H) 11/13/2019   HGBA1C 5.4 08/29/2019   HGBA1C 6.1 01/17/2019   HGBA1C 4.9 01/06/2007   Lab Results  Component Value Date   INSULIN 24.4 07/21/2021   INSULIN 19.1 11/26/2020   INSULIN 15.9 04/17/2020   INSULIN 30.1 (H) 11/13/2019   Lab Results  Component Value Date   TSH 2.960 07/21/2021   Lab Results  Component Value Date   CHOL 185 07/21/2021   HDL 55 07/21/2021   LDLCALC 105 (H) 07/21/2021   TRIG 144 07/21/2021   CHOLHDL 3.0 11/26/2020   Lab Results  Component Value Date   VD25OH 69.4 07/21/2021   VD25OH 45.1 11/26/2020   VD25OH 127.0 (H) 04/17/2020   Lab Results  Component Value Date   WBC 7.5 07/21/2021   HGB 13.5 07/21/2021   HCT 41.4 07/21/2021   MCV 83 07/21/2021   PLT 290 07/21/2021   Lab Results  Component Value Date   IRON 56 07/21/2021   TIBC 449 07/21/2021  FERRITIN 11 (L) 07/21/2021   Attestation Statements:   Reviewed by clinician on day of visit: allergies, medications, problem list, medical history, surgical history, family history, social history, and previous encounter notes.  Time spent on visit including pre-visit chart review and post-visit care and charting was 32 minutes.   I, Trixie Dredge, am acting as transcriptionist for Dennard Nip, MD.  I have reviewed the above documentation for accuracy and completeness, and I agree with the above. -  Dennard Nip, MD

## 2021-12-22 DIAGNOSIS — J3089 Other allergic rhinitis: Secondary | ICD-10-CM | POA: Diagnosis not present

## 2021-12-22 DIAGNOSIS — J3081 Allergic rhinitis due to animal (cat) (dog) hair and dander: Secondary | ICD-10-CM | POA: Diagnosis not present

## 2021-12-22 DIAGNOSIS — J301 Allergic rhinitis due to pollen: Secondary | ICD-10-CM | POA: Diagnosis not present

## 2021-12-23 NOTE — Progress Notes (Deleted)
[facility] [address] [place] Phone: [phone] Subjective:    I'm seeing this patient by the request  of:  Isaac Bliss, Rayford Halsted, MD  CC:   [id number]  10/21/2021 Patient is doing really well with the viscosupplementation at this point.  Encouraged her to continue to stay active.  Patient given a over-the-counter medial unloader brace and we will see how patient responds.  Patient will continue to increase activity as tolerated and follow-up with me again in 3 to 4 months  Update 12/24/2021 Cassandra [name] is a 76 y.o. female coming in with complaint of L knee pain. Patient states        Past Medical History:  Diagnosis Date   Anemia    Anxiety    Asthma    Back pain    BPPV (benign paroxysmal positional vertigo)    Chronic allergic conjunctivitis    Depression    Diverticulosis    Fibromyalgia    Gallbladder problem    Gallstones    GERD (gastroesophageal reflux disease)    Hiatal hernia    History of bladder stone    Iron (Fe) deficiency anemia    Joint pain    Lump in female breast    Mild persistent asthma    followed by pcp--- dr Alfonso Patten. sharma ([place] allergy/asthma)   OA (osteoarthritis)    knees, hands   OAB (overactive bladder)    OSA (obstructive sleep apnea)    per pt has not used cpap several years   Pancreatic cyst    PONV (postoperative nausea and vomiting)    RLS (restless legs syndrome)    Seasonal and perennial allergic rhinitis    SUI (stress urinary incontinence, female)    Swallowing difficulty    Vitamin B12 deficiency    Vitamin D deficiency    Wears glasses    Past Surgical History:  Procedure Laterality Date   BLADDER SUSPENSION  1990's   sling   bladder tacking  2010   with mesh   BREAST LUMPECTOMY Left 1985   Benign cyst   BREAST REDUCTION SURGERY Bilateral 2003 approx.   CORNEA LESION EXCISION Right 2005  approx.   CYSTOSCOPY N/A 07/12/2016   Procedure: CYSTOSCOPY,  VAGINOSCOPY, EXAM UNDER ANESTHESIA, STONE LITHOTRIPSY,;  Surgeon: Cleon Gustin, MD;  Location: [facility];  Service: Urology;  Laterality: N/A;   CYSTOSCOPY N/A 10/11/2016   Procedure: CYSTOSCOPY;  Surgeon: Cleon Gustin, MD;  Location: [facility];  Service: Urology;  Laterality: N/A;   CYSTOSCOPY WITH LITHOLAPAXY N/A 10/11/2016   Procedure: CYSTOSCOPY WITH LITHOLAPAXY/ EXCISION OF MESH;  Surgeon: Cleon Gustin, MD;  Location: [facility];  Service: Urology;  Laterality: N/A;   CYSTOSCOPY WITH LITHOLAPAXY N/A 01/22/2019   Procedure: CYSTOSCOPY WITH LITHOLAPAXY;  Surgeon: Cleon Gustin, MD;  Location: [facility];  Service: Urology;  Laterality: N/A;  1 HR   HOLMIUM LASER APPLICATION  3/55/7322   Procedure: HOLMIUM LASER APPLICATION;  Surgeon: Cleon Gustin, MD;  Location: [facility];  Service: Urology;;   HOLMIUM LASER APPLICATION N/A 0/25/4270   Procedure: HOLMIUM LASER APPLICATION;  Surgeon: Cleon Gustin, MD;  Location: [facility];  Service: Urology;  Laterality: N/A;   RIGHT KNEE ARTHROSCOPY  2013   TOTAL KNEE ARTHROPLASTY Right 09/12/2012   Procedure: RIGHT TOTAL KNEE ARTHROPLASTY;  Surgeon: Gearlean Alf, MD;  Location: [facility];  Service:  Orthopedics;  Laterality: Right;   VAGINAL HYSTERECTOMY  1984   Social History   Socioeconomic History   Marital status: Widowed    Spouse name: Not on file   Number of children: 2   Years of education: Not on file   Highest education level: Bachelor's degree (e.g., BA, AB, BS)  Occupational History   Occupation: Biochemist, clinical Retired  Tobacco Use   Smoking status: Never   Smokeless tobacco: Never  Vaping Use   Vaping Use: Never used  Substance and Sexual Activity   Alcohol use: Yes    Comment: 1x monthly   Drug use: Never   Sexual activity: Not on file  Other Topics Concern   Not on file  Social  History Narrative   Lives alone   R handed   Caffeine: 1 C a day   Social Determinants of Health   Financial Resource Strain: Not on file  Food Insecurity: Not on file  Transportation Needs: Not on file  Physical Activity: Not on file  Stress: Not on file  Social Connections: Not on file   Allergies  Allergen Reactions   Shellfish Allergy Hives   Cephalosporins Hives   Codeine     Other reaction(s): Unknown   Hydrochlorothiazide Hives   Misc. Sulfonamide Containing Compounds Hives   Penicillins Hives   Sulfa Antibiotics Hives   Sulfacetamide Sodium     Other reaction(s): Unknown   Family History  Problem Relation Age of Onset   Kidney cancer Mother    Diabetes Mother    Obesity Mother    Dementia Mother        died age 24   Other Father        burn victum   Alcoholism Father    Colon cancer Neg Hx    Inflammatory bowel disease Neg Hx    Liver disease Neg Hx    Pancreatic cancer Neg Hx    Stomach cancer Neg Hx    Rectal cancer Neg Hx     Current Outpatient Medications (Endocrine & Metabolic):    metFORMIN (GLUCOPHAGE) 500 MG tablet, Take 1 tablet (500 mg total) by mouth daily with breakfast.  Current Outpatient Medications (Cardiovascular):    EPINEPHrine 0.3 mg/0.3 mL IJ SOAJ injection, Inject 0.3 mg into the muscle as needed for anaphylaxis.   spironolactone (ALDACTONE) 25 MG tablet, TAKE 1 TABLET (25 MG TOTAL) BY MOUTH DAILY.  Current Outpatient Medications (Respiratory):    fluticasone (FLONASE) 50 MCG/ACT nasal spray, Place into both nostrils.   levocetirizine (XYZAL) 5 MG tablet, Take 5 mg by mouth every evening.     Current Outpatient Medications (Other):    escitalopram (LEXAPRO) 5 MG tablet, Take 1 tablet (5 mg total) by mouth daily with breakfast.   gabapentin (NEURONTIN) 100 MG capsule, TAKE 2 CAPSULES BY MOUTH AT BEDTIME.   Misc Natural Products (TART CHERRY ADVANCED) CAPS, Take 1 capsule by mouth. '1200mg'$  capsule   omeprazole (PRILOSEC) 20 MG  capsule, Take 20 mg by mouth 2 (two) times daily before a meal.   psyllium (REGULOID) 0.52 g capsule, Take 0.52 g by mouth daily.   traZODone (DESYREL) 50 MG tablet, Take 1 tablet (50 mg total) by mouth at bedtime as needed for sleep.   Reviewed prior external information including notes and imaging from  primary care provider As well as notes that were available from care everywhere and other healthcare systems.  Past medical history, social, surgical and family history all reviewed in electronic medical record.  No pertanent information unless stated regarding to the chief complaint.   Review of Systems:  No headache, visual changes, nausea, vomiting, diarrhea, constipation, dizziness, abdominal pain, skin rash, fevers, chills, night sweats, weight loss, swollen lymph nodes, body aches, joint swelling, chest pain, shortness of breath, mood changes. POSITIVE muscle aches  Objective  There were no vitals taken for this visit.   General: No apparent distress alert and oriented x3 mood and affect normal, dressed appropriately.  HEENT: Pupils equal, extraocular movements intact  Respiratory: Patient's speak in full sentences and does not appear short of breath  Cardiovascular: No lower extremity edema, non tender, no erythema      Impression and Recommendations:

## 2021-12-24 ENCOUNTER — Ambulatory Visit: Payer: PPO | Admitting: Family Medicine

## 2021-12-28 ENCOUNTER — Encounter (INDEPENDENT_AMBULATORY_CARE_PROVIDER_SITE_OTHER): Payer: Self-pay | Admitting: Family Medicine

## 2021-12-29 ENCOUNTER — Telehealth: Payer: Self-pay

## 2021-12-29 DIAGNOSIS — K869 Disease of pancreas, unspecified: Secondary | ICD-10-CM

## 2021-12-29 DIAGNOSIS — D490 Neoplasm of unspecified behavior of digestive system: Secondary | ICD-10-CM

## 2021-12-29 NOTE — Telephone Encounter (Signed)
Left message on machine to call back  

## 2021-12-29 NOTE — Telephone Encounter (Signed)
Patient returned your call, please advise. 

## 2021-12-29 NOTE — Telephone Encounter (Signed)
The pt has returned call and has ok'd the MRI for follow up.  The order has been entered and sent to the schedulers.

## 2021-12-29 NOTE — Telephone Encounter (Signed)
-----   Message from Aleatha Borer, LPN sent at 11/01/7996  2:02 PM EDT ----- Regarding: MRI/MRCP From Dr. Rush Landmark -  ,  Please place a recall for 1 year MRI/MRCP.  Thanks.  GM

## 2021-12-30 ENCOUNTER — Ambulatory Visit (INDEPENDENT_AMBULATORY_CARE_PROVIDER_SITE_OTHER): Payer: PPO | Admitting: Neurology

## 2021-12-30 DIAGNOSIS — Z8669 Personal history of other diseases of the nervous system and sense organs: Secondary | ICD-10-CM

## 2021-12-30 DIAGNOSIS — Z91030 Bee allergy status: Secondary | ICD-10-CM | POA: Diagnosis not present

## 2021-12-30 DIAGNOSIS — G4730 Sleep apnea, unspecified: Secondary | ICD-10-CM

## 2021-12-30 DIAGNOSIS — F5104 Psychophysiologic insomnia: Secondary | ICD-10-CM

## 2021-12-30 DIAGNOSIS — J4530 Mild persistent asthma, uncomplicated: Secondary | ICD-10-CM | POA: Diagnosis not present

## 2021-12-30 DIAGNOSIS — J3089 Other allergic rhinitis: Secondary | ICD-10-CM | POA: Diagnosis not present

## 2021-12-30 DIAGNOSIS — Z72821 Inadequate sleep hygiene: Secondary | ICD-10-CM

## 2021-12-30 DIAGNOSIS — J301 Allergic rhinitis due to pollen: Secondary | ICD-10-CM | POA: Diagnosis not present

## 2021-12-30 DIAGNOSIS — F419 Anxiety disorder, unspecified: Secondary | ICD-10-CM

## 2021-12-30 DIAGNOSIS — E6609 Other obesity due to excess calories: Secondary | ICD-10-CM

## 2021-12-30 NOTE — Telephone Encounter (Signed)
NPSG Auth: [id number] (12/21/21-03/23/22) Pt will arrive @ 8pm * Spoke to the patient she is aware of the new system update SF  Patient scheduled for 12/30/21.

## 2021-12-31 ENCOUNTER — Other Ambulatory Visit (INDEPENDENT_AMBULATORY_CARE_PROVIDER_SITE_OTHER): Payer: Self-pay | Admitting: Family Medicine

## 2021-12-31 DIAGNOSIS — F3289 Other specified depressive episodes: Secondary | ICD-10-CM

## 2022-01-05 DIAGNOSIS — J3089 Other allergic rhinitis: Secondary | ICD-10-CM | POA: Diagnosis not present

## 2022-01-05 DIAGNOSIS — J301 Allergic rhinitis due to pollen: Secondary | ICD-10-CM | POA: Diagnosis not present

## 2022-01-06 ENCOUNTER — Encounter (INDEPENDENT_AMBULATORY_CARE_PROVIDER_SITE_OTHER): Payer: Self-pay

## 2022-01-07 ENCOUNTER — Telehealth: Payer: Self-pay | Admitting: Neurology

## 2022-01-07 ENCOUNTER — Encounter: Payer: Self-pay | Admitting: Neurology

## 2022-01-07 NOTE — Telephone Encounter (Signed)
-----   Message from Larey Seat, MD sent at 01/07/2022 10:16 AM EDT ----- IMPRESSION:   1) There was no AHI above 5/h noted and this would not qualify for a diagnosis of Sleep Apnea. No clinically relevant degree of oxyhemoglobin desaturation was associated. There was no snoring recorded. Even during REM sleep, the AHI stayed below 5/h. Caveat: This sleep study may underestimate the severity of sleep apnea as supine sleep was not observed.   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 These were the main cause of sleep fragmentation but spared REM sleep.  3) Chronic insomnia with sleep initiation: sleep architecture documented reduced sleep- Total sleep time was reduced at 223.5 minutes.Sleep efficiency was decreased at 44.1%.  REM sleep onset was very delayed, which an be related to medication effect.   RECOMMENDATIONS: 1) Pharmacologic treatment of periodic limb movement disorder may be helpful. It is also possible that this patient has a radiculopathy or neuropathy causing right leg movements.  2) No need for apnea treatment. 3) Insomnia is chronic by history and only partially related to PLMD. The delayed sleep onset is likely of psychological origin. I recommend CBT which is now easier available online and work on sleep hygiene. Consider psychiatry input for hyperactivity, impulsivity or OCD related sleep interference. The patient has failed Restoril, Ambien, Unisom, Benadryl, and even xanax , and experienced sometimes paradox effects.   Larey Seat, M.D. Medical Director of [facility] certified by the [place] and ABPN in Neurology and Sleep  CC: Dr Rayford Halsted. Isaac Bliss, MD CC: Dr. Dennard Nip, MD

## 2022-01-07 NOTE — Procedures (Signed)
[facility] POLYSOMNOGRAPHY INTERPRETATION REPORT    STUDY DATE: 12/30/2021     PATIENT NAME: Cassandra [name]   DATE OF BIRTH: [birth year]  PATIENT ID: [id number]  TYPE OF STUDY: PSG  READING PHYSICIAN: Larey Seat, MD SCORING TECHNICIAN: Gaylyn Cheers, RPSGT   INDICATIONS:  12-15-2021:? Cassandra [name] is a 76 year-old female patient, seen upon referral on 7/18/202, from Dr.C. Leafy Ro, MD, for ?new evaluation of sleep disorders. Chief concern :?" I had a sleep study with my pulmonologist in 2009 and was prescribed CPAP  but was never happy with CPAP, I tried different masks. " I still have a Resmed S 9. I have insomnia, I am very hyper and I need a restful night of sleep'". "My smart watch has recorded 6 hours of sleep, but I know I didn't sleep that much"? This 76 year-old Female reports poor sleep quality and Insomnia. The Epworth Sleepiness Scale was endorsed at 2 out of 24 (scores above or equal to 10 are suggestive of hypersomnolence). The Fatigue Severity Score (FSS) was endorsed at 44/ 63 points. GDS at 6/ 15 points.   DESCRIPTION: A sleep technologist (RPSGT) was in attendance for the duration of the recording. Data collection, scoring, video monitoring, and reporting were performed in compliance with the AASM Manual for the Scoring of Sleep and Associated Events; (Hypopnea is scored based on the criteria listed in Section VIII D. 1b in the AASM Manual V2.6 using a 4% oxygen desaturation rule or Hypopnea is scored based on the criteria listed in Section VIII D. 1a in the AASM Manual V2.6 using 3% oxygen desaturation and /or arousal rule). A physician certified by the American Board of Sleep Medicine reviewed each epoch of the study.  ADDITIONAL INFORMATION: Height: 66 in Weight: 203 lb (BMI 33) Neck Size: 16 in Medications: Epinephrine, Lexapro, Flonase, Neurontin, Xyzal, Glucophage, Tart Cherry Advanced, Prilosec, Reguloid, Aldactone  FINDINGS: Please  refer to the attached summary for additional quantitative information.  SLEEP CONTINUITY AND SLEEP ARCHITECTURE: Lights off was at 21:03: and lights on 05:33: (8.4 hours in bed). Total sleep time was 223.5 minutes (0.0% supine; 100.0% lateral; 0% prone, 19.0% REM sleep), with a decreased sleep efficiency at 44.1%. Sleep latency was increased at 150.0 minutes. REM sleep latency was increased at 311.0 minutes. Of the total sleep time, the percentage of stage N1 sleep was 7.8%, stage N2 sleep was 73%, stage N3 sleep was 0.0%, and REM sleep was 19.0%. There was one Stage R period observed on this study night, 8 awakenings (i.e. transitions to Stage W from any sleep stage), and 35 total stage transitions.  Wake after sleep onset (WASO) time accounted for 37.4%.   AROUSAL: There were 30 arousals in total, for an arousal index of 8 arousals /hour. Of these, 3 were identified as respiratory-related arousals (1 /hr), 15 were PLM-related arousals (4 /h), and 11 were non-specific arousals (3 /h)  RESPIRATORY MONITORING:  Based on CMS set criteria (using a 4% oxygen desaturation rule for scoring hypopneas), there were 10 apneas (1 obstructive; 6 central; 3 mixed), and 7 hypopneas prsent. Apnea index was 2.7/h. Hypopnea index was 1.9/h.  The apnea-hypopnea index ( AHI) was 4.6/h overall (0.0 supine, 6 non-supine; 5.6 REM, 0.0 supine REM). There were 0 respiratory effort-related arousals (RERAs). Total respiratory disturbance index (RDI) was 4.6 events/h. RDI results showed: supine RDI 0.0 /hr; non-supine RDI 4.6 /hr; REM RDI 5.6 /hr, supine REM RDI 0.0 /hr.   Based on AASM criteria (  using a 3% oxygen desaturation and /or arousal rule for scoring hypopneas), there were 10 apneas (1 obstructive; 6 central; 3 mixed), and 10 hypopneas. Apnea index was 2.7/h. Hypopnea index was 2.7/h. The apnea-hypopnea index was 5.4 overall (0.0 supine, 6 non-supine; 5.6 REM, 0.0 supine REM). There were 0 respiratory effort-related  arousals (RERAs). Respiratory events were associated with minor oxyhemoglobin desaturations (nadir during sleep 82%) from a mean of 94%). There were 0 occurrences of Cheyne Stokes breathing.   OXIMETRY: Total sleep time spent at or below 88% was 0.8 minutes, or 0.4% of total sleep time. Snoring was classified as very mild ( not captured) or not present. BODY POSITION: Duration of total sleep and percent of total sleep in their respective position is as follows: supine 00 minutes (0%), non-supine 224 minutes (100%); right 174 minutes (78%), left 49 minutes (22%), and prone 00 minutes (0%).   LIMB MOVEMENTS: There were 134 periodic limb movements of sleep (36.0/hr), of which 15 (4.0/hr) were associated with an arousal. These PLMs were not evident during REM sleep and arose from the right lower extremity.   EEG: With this limited EEG-PSG montage recording, no EEG abnormalities were observed.  CARDIAC: The EKG documented an average heart rate during sleep at 69 bpm. The heart rate during sleep varied from 58- 80 bpm. The Video/ Audio recording showed no sleep related abnormal vocalizations, complex movements,confusional arousals or evidence of parasomnia.   IMPRESSION:   1) There was no AHI above 5/h noted and this would not qualify for a diagnosis of Sleep Apnea. No clinically relevant degree of oxyhemoglobin desaturation was associated. There was no snoring recorded. Even during REM sleep, the AHI stayed below 5/h. Caveat: This sleep study may underestimate the severity of sleep apnea as supine sleep was not observed.   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 These were the main cause of sleep fragmentation but spared REM sleep.  3) Chronic insomnia with sleep initiation: sleep architecture documented reduced sleep- Total sleep time was reduced at 223.5 minutes.Sleep efficiency was decreased at 44.1%.  REM sleep onset was very delayed, which an be related to medication  effect.   RECOMMENDATIONS: 1) Pharmacologic treatment of periodic limb movement disorder may be helpful. It is also possible that this patient has a radiculopathy or neuropathy causing right leg movements.  2) No need for apnea treatment. 3) Insomnia is chronic by history and only partially related to PLMD. The delayed sleep onset is likely of psychological origin. I recommend CBT which is now easier available online and work on sleep hygiene. Consider psychiatry input for hyperactivity, impulsivity or OCD related sleep interference. The patient has failed Restoril, Ambien, Unisom, Benadryl, and even xanax , and experienced sometimes paradox effects.   Larey Seat, M.D. Medical Director of [facility] certified by the [place] and ABPN in Neurology and Sleep  CC: Dr Rayford Halsted. Isaac Bliss, MD CC: Dr. Dennard Nip, MD

## 2022-01-07 NOTE — Telephone Encounter (Signed)
Called patient to discuss sleep study results. No answer at this time. LVM for the patient to call back.   

## 2022-01-12 DIAGNOSIS — J3081 Allergic rhinitis due to animal (cat) (dog) hair and dander: Secondary | ICD-10-CM | POA: Diagnosis not present

## 2022-01-12 DIAGNOSIS — J3089 Other allergic rhinitis: Secondary | ICD-10-CM | POA: Diagnosis not present

## 2022-01-12 DIAGNOSIS — J301 Allergic rhinitis due to pollen: Secondary | ICD-10-CM | POA: Diagnosis not present

## 2022-01-12 NOTE — Progress Notes (Unsigned)
[place] Phone: [phone] Subjective:   [name], am serving as a scribe for Dr. Hulan Saas.  I'm seeing this patient by the request  of:  Isaac Bliss, Rayford Halsted, MD  CC: Left knee pain  [id number]  10/21/2021 Patient is doing really well with the viscosupplementation at this point.  Encouraged her to continue to stay active.  Patient given a over-the-counter medial unloader brace and we will see how patient responds.  Patient will continue to increase activity as tolerated and follow-up with me again in 3 to 4 months  Updated 01/13/2022 Cassandra [name] is a 76 y.o. female coming in with complaint of L knee pain. Patient states that the brace was helpful but kept pressing over lateral hamstring tendon. Patient did a bit of walking with her son at a fair and since then her knee has been swollen.        Past Medical History:  Diagnosis Date   Anemia    Anxiety    Asthma    Back pain    BPPV (benign paroxysmal positional vertigo)    Chronic allergic conjunctivitis    Depression    Diverticulosis    Fibromyalgia    Gallbladder problem    Gallstones    GERD (gastroesophageal reflux disease)    Hiatal hernia    History of bladder stone    Iron (Fe) deficiency anemia    Joint pain    Lump in female breast    Mild persistent asthma    followed by pcp--- dr Alfonso Patten. sharma ([place] allergy/asthma)   OA (osteoarthritis)    knees, hands   OAB (overactive bladder)    OSA (obstructive sleep apnea)    per pt has not used cpap several years   Pancreatic cyst    PONV (postoperative nausea and vomiting)    RLS (restless legs syndrome)    Seasonal and perennial allergic rhinitis    SUI (stress urinary incontinence, female)    Swallowing difficulty    Vitamin B12 deficiency    Vitamin D deficiency    Wears glasses    Past Surgical History:  Procedure Laterality Date   BLADDER SUSPENSION  1990's    sling   bladder tacking  2010   with mesh   BREAST LUMPECTOMY Left 1985   Benign cyst   BREAST REDUCTION SURGERY Bilateral 2003 approx.   CORNEA LESION EXCISION Right 2005  approx.   CYSTOSCOPY N/A 07/12/2016   Procedure: CYSTOSCOPY, VAGINOSCOPY, EXAM UNDER ANESTHESIA, STONE LITHOTRIPSY,;  Surgeon: Cleon Gustin, MD;  Location: [facility];  Service: Urology;  Laterality: N/A;   CYSTOSCOPY N/A 10/11/2016   Procedure: CYSTOSCOPY;  Surgeon: Cleon Gustin, MD;  Location: [facility];  Service: Urology;  Laterality: N/A;   CYSTOSCOPY WITH LITHOLAPAXY N/A 10/11/2016   Procedure: CYSTOSCOPY WITH LITHOLAPAXY/ EXCISION OF MESH;  Surgeon: Cleon Gustin, MD;  Location: [facility];  Service: Urology;  Laterality: N/A;   CYSTOSCOPY WITH LITHOLAPAXY N/A 01/22/2019   Procedure: CYSTOSCOPY WITH LITHOLAPAXY;  Surgeon: Cleon Gustin, MD;  Location: [facility];  Service: Urology;  Laterality: N/A;  1 HR   HOLMIUM LASER APPLICATION  2/83/1517   Procedure: HOLMIUM LASER APPLICATION;  Surgeon: Cleon Gustin, MD;  Location: [facility];  Service: Urology;;   HOLMIUM LASER APPLICATION N/A 11/14/735   Procedure: HOLMIUM LASER APPLICATION;  Surgeon: Cleon Gustin,  MD;  Location: [facility];  Service: Urology;  Laterality: N/A;   RIGHT KNEE ARTHROSCOPY  2013   TOTAL KNEE ARTHROPLASTY Right 09/12/2012   Procedure: RIGHT TOTAL KNEE ARTHROPLASTY;  Surgeon: Gearlean Alf, MD;  Location: [facility];  Service: Orthopedics;  Laterality: Right;   VAGINAL HYSTERECTOMY  1984   Social History   Socioeconomic History   Marital status: Widowed    Spouse name: Not on file   Number of children: 2   Years of education: Not on file   Highest education level: Bachelor's degree (e.g., BA, AB, BS)  Occupational History   Occupation: Biochemist, clinical Retired  Tobacco Use   Smoking status: Never    Smokeless tobacco: Never  Vaping Use   Vaping Use: Never used  Substance and Sexual Activity   Alcohol use: Yes    Comment: 1x monthly   Drug use: Never   Sexual activity: Not on file  Other Topics Concern   Not on file  Social History Narrative   Lives alone   R handed   Caffeine: 1 C a day   Social Determinants of Health   Financial Resource Strain: Not on file  Food Insecurity: Not on file  Transportation Needs: Not on file  Physical Activity: Not on file  Stress: Not on file  Social Connections: Not on file   Allergies  Allergen Reactions   Shellfish Allergy Hives   Cephalosporins Hives   Codeine     Other reaction(s): Unknown   Hydrochlorothiazide Hives   Misc. Sulfonamide Containing Compounds Hives   Penicillins Hives   Sulfa Antibiotics Hives   Sulfacetamide Sodium     Other reaction(s): Unknown   Family History  Problem Relation Age of Onset   Kidney cancer Mother    Diabetes Mother    Obesity Mother    Dementia Mother        died age 33   Other Father        burn victum   Alcoholism Father    Colon cancer Neg Hx    Inflammatory bowel disease Neg Hx    Liver disease Neg Hx    Pancreatic cancer Neg Hx    Stomach cancer Neg Hx    Rectal cancer Neg Hx     Current Outpatient Medications (Endocrine & Metabolic):    metFORMIN (GLUCOPHAGE) 500 MG tablet, Take 1 tablet (500 mg total) by mouth daily with breakfast.  Current Outpatient Medications (Cardiovascular):    EPINEPHrine 0.3 mg/0.3 mL IJ SOAJ injection, Inject 0.3 mg into the muscle as needed for anaphylaxis.   spironolactone (ALDACTONE) 25 MG tablet, TAKE 1 TABLET (25 MG TOTAL) BY MOUTH DAILY.  Current Outpatient Medications (Respiratory):    fluticasone (FLONASE) 50 MCG/ACT nasal spray, Place into both nostrils.   levocetirizine (XYZAL) 5 MG tablet, Take 5 mg by mouth every evening.     Current Outpatient Medications (Other):    escitalopram (LEXAPRO) 10 MG tablet, Take 1 tablet (10 mg  total) by mouth daily with breakfast.   gabapentin (NEURONTIN) 100 MG capsule, TAKE 2 CAPSULES BY MOUTH AT BEDTIME.   Misc Natural Products (TART CHERRY ADVANCED) CAPS, Take 1 capsule by mouth. '1200mg'$  capsule   omeprazole (PRILOSEC) 20 MG capsule, Take 20 mg by mouth 2 (two) times daily before a meal.   psyllium (REGULOID) 0.52 g capsule, Take 0.52 g by mouth daily.   traZODone (DESYREL) 50 MG tablet, Take 1 tablet (50 mg total) by mouth at bedtime as needed  for sleep.   Reviewed prior external information including notes and imaging from  primary care provider As well as notes that were available from care everywhere and other healthcare systems.  Past medical history, social, surgical and family history all reviewed in electronic medical record.  No pertanent information unless stated regarding to the chief complaint.   Review of Systems:  No headache, visual changes, nausea, vomiting, diarrhea, constipation, dizziness, abdominal pain, skin rash, fevers, chills, night sweats, weight loss, swollen lymph nodes, body aches, joint swelling, chest pain, shortness of breath, mood changes. POSITIVE muscle aches  Objective  Blood pressure 120/68, pulse 77, height '5\' 5"'$  (1.651 m), weight 201 lb (91.2 kg), SpO2 97 %.   General: No apparent distress alert and oriented x3 mood and affect normal, dressed appropriately.  HEENT: Pupils equal, extraocular movements intact  Respiratory: Patient's speak in full sentences and does not appear short of breath  Left knee exam still shows instability noted with valgus and varus force.  Patient is more tender to palpation over the hamstring itself on the back and.  Patient does have pain also in the gastrocnemius area.  Severe tenderness more on the lateral compartment.  No masses or swelling noted.  Patient noted does have trace effusion of the left leg compared to the contralateral side.    Impression and Recommendations:

## 2022-01-13 ENCOUNTER — Ambulatory Visit (INDEPENDENT_AMBULATORY_CARE_PROVIDER_SITE_OTHER): Payer: PPO | Admitting: Family Medicine

## 2022-01-13 ENCOUNTER — Ambulatory Visit: Payer: PPO | Admitting: Family Medicine

## 2022-01-13 ENCOUNTER — Encounter: Payer: Self-pay | Admitting: Family Medicine

## 2022-01-13 ENCOUNTER — Encounter (INDEPENDENT_AMBULATORY_CARE_PROVIDER_SITE_OTHER): Payer: Self-pay | Admitting: Family Medicine

## 2022-01-13 VITALS — BP 93/54 | HR 82 | Temp 98.3°F | Ht 65.0 in | Wt 201.0 lb

## 2022-01-13 VITALS — BP 120/68 | HR 77 | Ht 65.0 in | Wt 201.0 lb

## 2022-01-13 DIAGNOSIS — R7303 Prediabetes: Secondary | ICD-10-CM | POA: Diagnosis not present

## 2022-01-13 DIAGNOSIS — M79662 Pain in left lower leg: Secondary | ICD-10-CM | POA: Diagnosis not present

## 2022-01-13 DIAGNOSIS — E669 Obesity, unspecified: Secondary | ICD-10-CM | POA: Diagnosis not present

## 2022-01-13 DIAGNOSIS — F3289 Other specified depressive episodes: Secondary | ICD-10-CM | POA: Diagnosis not present

## 2022-01-13 DIAGNOSIS — M1712 Unilateral primary osteoarthritis, left knee: Secondary | ICD-10-CM

## 2022-01-13 DIAGNOSIS — E559 Vitamin D deficiency, unspecified: Secondary | ICD-10-CM | POA: Insufficient documentation

## 2022-01-13 DIAGNOSIS — E785 Hyperlipidemia, unspecified: Secondary | ICD-10-CM

## 2022-01-13 DIAGNOSIS — Z6833 Body mass index (BMI) 33.0-33.9, adult: Secondary | ICD-10-CM

## 2022-01-13 DIAGNOSIS — Z7984 Long term (current) use of oral hypoglycemic drugs: Secondary | ICD-10-CM | POA: Diagnosis not present

## 2022-01-13 MED ORDER — METFORMIN HCL 500 MG PO TABS: 500.0000 mg | ORAL_TABLET | Freq: Every day | ORAL | 0 refills | Status: DC

## 2022-01-13 MED ORDER — ESCITALOPRAM OXALATE 10 MG PO TABS: 10.0000 mg | ORAL_TABLET | Freq: Every day | ORAL | 0 refills | Status: DC

## 2022-01-13 NOTE — Patient Instructions (Addendum)
  [facility]  ([facility] [employer] [facility]) [address], [address] [place], [place] [address] [phone] 01/14/2022, 9:00am appt arrive at 8:45am  Hamstring exercises  See me in 6-8 weeks

## 2022-01-13 NOTE — Assessment & Plan Note (Signed)
Likely arthritis with some mild strain noted.  Patient does have pain on the calf and seems to be more patient does have significant anxiety and there is a trace amount of swelling of the lower extremity compared to the contralateral side so we will get a Doppler done to further evaluate tomorrow.  Patient knows if worsening pain or shortness of breath to seek medical attention.  Patient should do relatively well though this is likely just a formality.  Follow-up with me again in 6 to 8 weeks and if worsening can consider injection into the knee.

## 2022-01-14 ENCOUNTER — Ambulatory Visit: Payer: PPO | Admitting: Neurology

## 2022-01-14 ENCOUNTER — Ambulatory Visit (HOSPITAL_COMMUNITY)
Admission: RE | Admit: 2022-01-14 | Discharge: 2022-01-14 | Disposition: A | Discharge status: Home / Self Care | Payer: PPO | Source: Ambulatory Visit | Attending: Gastroenterology | Admitting: Gastroenterology

## 2022-01-14 ENCOUNTER — Encounter: Payer: Self-pay | Admitting: Psychology

## 2022-01-14 ENCOUNTER — Encounter: Payer: Self-pay | Admitting: Neurology

## 2022-01-14 ENCOUNTER — Ambulatory Visit (HOSPITAL_BASED_OUTPATIENT_CLINIC_OR_DEPARTMENT_OTHER)
Admission: RE | Admit: 2022-01-14 | Discharge: 2022-01-14 | Disposition: A | Discharge status: Home / Self Care | Payer: PPO | Source: Ambulatory Visit | Attending: Family Medicine | Admitting: Family Medicine

## 2022-01-14 ENCOUNTER — Other Ambulatory Visit: Payer: Self-pay | Admitting: Gastroenterology

## 2022-01-14 VITALS — BP 123/58 | HR 78 | Ht 65.0 in | Wt 202.0 lb

## 2022-01-14 DIAGNOSIS — K8020 Calculus of gallbladder without cholecystitis without obstruction: Secondary | ICD-10-CM | POA: Diagnosis not present

## 2022-01-14 DIAGNOSIS — R935 Abnormal findings on diagnostic imaging of other abdominal regions, including retroperitoneum: Secondary | ICD-10-CM | POA: Diagnosis not present

## 2022-01-14 DIAGNOSIS — F5104 Psychophysiologic insomnia: Secondary | ICD-10-CM | POA: Diagnosis not present

## 2022-01-14 DIAGNOSIS — E6609 Other obesity due to excess calories: Secondary | ICD-10-CM

## 2022-01-14 DIAGNOSIS — D490 Neoplasm of unspecified behavior of digestive system: Secondary | ICD-10-CM | POA: Diagnosis not present

## 2022-01-14 DIAGNOSIS — M79662 Pain in left lower leg: Secondary | ICD-10-CM | POA: Insufficient documentation

## 2022-01-14 DIAGNOSIS — K863 Pseudocyst of pancreas: Secondary | ICD-10-CM | POA: Diagnosis not present

## 2022-01-14 DIAGNOSIS — Z6833 Body mass index (BMI) 33.0-33.9, adult: Secondary | ICD-10-CM

## 2022-01-14 DIAGNOSIS — K869 Disease of pancreas, unspecified: Secondary | ICD-10-CM

## 2022-01-14 DIAGNOSIS — R413 Other amnesia: Secondary | ICD-10-CM | POA: Diagnosis not present

## 2022-01-14 DIAGNOSIS — K828 Other specified diseases of gallbladder: Secondary | ICD-10-CM | POA: Diagnosis not present

## 2022-01-14 DIAGNOSIS — Z72821 Inadequate sleep hygiene: Secondary | ICD-10-CM | POA: Diagnosis not present

## 2022-01-14 LAB — VITAMIN D 25 HYDROXY (VIT D DEFICIENCY, FRACTURES): Vit D, 25-Hydroxy: 55.1 ng/mL (ref 30.0–100.0)

## 2022-01-14 LAB — MAGNESIUM: Magnesium: 2.4 mg/dL — ABNORMAL HIGH (ref 1.6–2.3)

## 2022-01-14 LAB — CMP14+EGFR
ALT: 7 IU/L (ref 0–32)
AST: 20 IU/L (ref 0–40)
Albumin/Globulin Ratio: 1.5 (ref 1.2–2.2)
Albumin: 4.3 g/dL (ref 3.8–4.8)
Alkaline Phosphatase: 86 IU/L (ref 44–121)
BUN/Creatinine Ratio: 14 (ref 12–28)
BUN: 12 mg/dL (ref 8–27)
Bilirubin Total: 0.4 mg/dL (ref 0.0–1.2)
CO2: 23 mmol/L (ref 20–29)
Calcium: 9.4 mg/dL (ref 8.7–10.3)
Chloride: 101 mmol/L (ref 96–106)
Creatinine, Ser: 0.83 mg/dL (ref 0.57–1.00)
Globulin, Total: 2.9 g/dL (ref 1.5–4.5)
Glucose: 81 mg/dL (ref 70–99)
Potassium: 4.1 mmol/L (ref 3.5–5.2)
Sodium: 140 mmol/L (ref 134–144)
Total Protein: 7.2 g/dL (ref 6.0–8.5)
eGFR: 74 mL/min/{1.73_m2} (ref >59)

## 2022-01-14 LAB — HEMOGLOBIN A1C
Est. average glucose Bld gHb Est-mCnc: 117 mg/dL
Hgb A1c MFr Bld: 5.7 % — ABNORMAL HIGH (ref 4.8–5.6)

## 2022-01-14 LAB — LIPID PANEL WITH LDL/HDL RATIO
Cholesterol, Total: 161 mg/dL (ref 100–199)
HDL: 53 mg/dL (ref >39)
LDL Chol Calc (NIH): 88 mg/dL (ref 0–99)
LDL/HDL Ratio: 1.7 ratio (ref 0.0–3.2)
Triglycerides: 108 mg/dL (ref 0–149)
VLDL Cholesterol Cal: 20 mg/dL (ref 5–40)

## 2022-01-14 LAB — INSULIN, RANDOM: INSULIN: 20 u[IU]/mL (ref 2.6–24.9)

## 2022-01-14 LAB — VITAMIN B12: Vitamin B-12: 585 pg/mL (ref 232–1245)

## 2022-01-14 LAB — TSH: TSH: 1.26 u[IU]/mL (ref 0.450–4.500)

## 2022-01-14 MED ORDER — PRAMIPEXOLE DIHYDROCHLORIDE 0.25 MG PO TABS: ORAL_TABLET | ORAL | 5 refills | Status: DC

## 2022-01-14 MED ORDER — GADOBUTROL 1 MMOL/ML IV SOLN
10.0000 mL | Freq: Once | INTRAVENOUS | Status: AC | PRN
  Administered 2022-01-14: 10 mL via INTRAVENOUS

## 2022-01-14 NOTE — Progress Notes (Signed)
SLEEP MEDICINE CLINIC    Provider:  Larey Seat, MD  Primary Care Physician:  Isaac Bliss, Rayford Halsted, [place] [address]     Referring Provider: Isaac Bliss, Rayford Halsted, [place],  [place] [address]          Chief Complaint according to patient   Patient presents with:     New Patient (Initial Visit)     Rm 10, alone. Here for sleep consult due to frequent nighttime wakings, does not wake refreshed, insomnia, and obesity. She is not EDS, has insomnia and now wonders if she can take more trazodone.        HISTORY OF PRESENT ILLNESS:   12-15-2021:  Cassandra [name] is a 76 y.o.  patient seen here as a referral on 01/14/2022 , and not sure how she should interpret the results of her sleep study- THERE was no apnea of clinical significance seen, she reports insomnia, Nocturia, and PLMs, she has sometimes RLS.  I can offer her trazodone again, she could even use a higher dose of 75 mg nightly, and discussed  RLS . She noted RLS especially bothersome while in the MRI machine !  She has a known condition of ; Lumbosacral spondylosis without myelopathy. Right low back pain radiating to the right hip and thigh. After epidural injections with dr Earnest Conroy. Sith, DO, she was free of these symptoms. I can offer mirapex prn.    Originally referred from Dr Leafy Ro for a new evaluation of sleep disorders. 12-15-2021. Marland Kitchen  Chief concern according to patient : " I had a sleep study with Dr Halford Chessman in 2009 and was not happy with CPAP, tried different masks. " I still have a Resmed S 9. I have insomnia, I ma very hyper and I need a restfull night of sleep'". My smart watch has recorded 6 hours of sleep, but I know I didn't sleep"     Document Description: Sleep Study  Order Summary: Polysomnography Report/[facility]  Polysomnography Report/[facility] By: Jerrye Noble D'jimraou 04/17/2008 16:51:49     Olevia Perches has a past medical history of Anemia, hyperactivity, Anxiety- grief, Asthma, Back pain, BPPV (benign paroxysmal positional vertigo), Chronic allergic conjunctivitis, Depression, Diverticulosis, Fibromyalgia, Gallbladder problem, Gallstones, GERD (gastroesophageal reflux disease), Hiatal hernia, History of bladder stone, Iron (Fe) deficiency anemia, Joint pain, Lump in female breast, Mild persistent asthma, OA (osteoarthritis), Tick bite, OAB (overactive bladder), OSA (obstructive sleep apnea), Pancreatic cyst, 2020, PONV (postoperative nausea and vomiting), RLS (restless legs syndrome), Seasonal and perennial allergic rhinitis, SUI (stress urinary incontinence, female), Swallowing difficulty, Vitamin B12 deficiency, Vertigo, hearing loss, Vitamin D deficiency, and Wears glasses.   The patient had the first sleep study in the year 2009  with a result of borderline apnea.    Sleep relevant medical history: Nocturia/ 3 times, Insomnia- ENT dx a small airway- snoring. Allergic rhinitis, in treatment. URI.    Family medical /sleep history: no other family member on CPAP with OSA, mother had allergies, asthma, dementia.   Social history:  Patient was working as Teacher, early years/pre- and lives in a household with alone. Daughter in [place], son in [place].  Family status is widowed in 2012.  The patient currently used to travel for work.  Tobacco use: none.  ETOH use ; one a month, quit 2020.  Caffeine intake in form of Coffee( /) Soda( /) Tea ( 1-2  week) body armor  energy drinks. Regular exercise in form of walking.     Sleep habits are as follows: The patient's dinner time is between variable - skips meal 2-3 PM.  The patient goes to bed at 10 PM and goes to sleep for 11-12 hours, watches TV in the bedroom- dozing, wakes for 2-3 bathroom breaks.    The preferred sleep position is prone, side, with the support of 1-2 pillows.  Dreams are reportedly rare.   9 AM is the usual rise time.  The patient wakes up spontaneously/ at 7. 45 .  She stays in bed, feels not motivated to get up, to eat, to dress.  She reports not feeling refreshed or restored in AM, with symptoms such as dry mouth, morning headaches, allergies and residual fatigue.  Naps are taken infrequently, lasting from 2.30 Pm  to 4 PM  ( she claims to sleep only 20-30 minutes) and are more refreshing than nocturnal sleep.    Review of Systems: Out of a complete 14 system review, the patient complains of only the following symptoms, and all other reviewed systems are negative.:  Fatigue, sleepiness , snoring, fragmented sleep, Insomnia - poor sleep hygiene,  Racing mind, weight gain. No Apetite, irregular meals , variable sleep habits.   Lumbosacral spondylosis without myelopathy. Right low back pain radiating to the right hip and thigh. Knee arthritis,   Not classic RLS. Not ascending from the feet- but radiating form the joints.    How likely are you to doze in the following situations: 0 = not likely, 1 = slight chance, 2 = moderate chance, 3 = high chance   Sitting and Reading? Watching Television? Sitting inactive in a public place (theater or meeting)? As a passenger in a car for an hour without a break? Lying down in the afternoon when circumstances permit? Sitting and talking to someone? Sitting quietly after lunch without alcohol? In a car, while stopped for a few minutes in traffic?   Total = 2/ 24 points   FSS endorsed at 44/ 63 points.  GDS 6/ 15   Social History   Socioeconomic History   Marital status: Widowed    Spouse name: Not on file   Number of children: 2   Years of education: Not on file   Highest education level: Bachelor's degree (e.g., BA, AB, BS)  Occupational History   Occupation: Biochemist, clinical Retired  Tobacco Use   Smoking status: Never   Smokeless tobacco: Never  Vaping Use   Vaping Use: Never used  Substance and Sexual Activity   Alcohol use: Yes     Comment: 1x monthly   Drug use: Never   Sexual activity: Not on file  Other Topics Concern   Not on file  Social History Narrative   Lives alone   R handed   Caffeine: 1 C a day   Social Determinants of Health   Financial Resource Strain: Not on file  Food Insecurity: Not on file  Transportation Needs: Not on file  Physical Activity: Not on file  Stress: Not on file  Social Connections: Not on file    Family History  Problem Relation Age of Onset   Kidney cancer Mother    Diabetes Mother    Obesity Mother    Dementia Mother        died age 34   Other Father        burn victum   Alcoholism Father    Colon cancer Neg  Hx    Inflammatory bowel disease Neg Hx    Liver disease Neg Hx    Pancreatic cancer Neg Hx    Stomach cancer Neg Hx    Rectal cancer Neg Hx     Past Medical History:  Diagnosis Date   Anemia    Anxiety    Asthma    Back pain    BPPV (benign paroxysmal positional vertigo)    Chronic allergic conjunctivitis    Depression    Diverticulosis    Fibromyalgia    Gallbladder problem    Gallstones    GERD (gastroesophageal reflux disease)    Hiatal hernia    History of bladder stone    Iron (Fe) deficiency anemia    Joint pain    Lump in female breast    Mild persistent asthma    followed by pcp--- dr Alfonso Patten. sharma ([place] allergy/asthma)   OA (osteoarthritis)    knees, hands   OAB (overactive bladder)    OSA (obstructive sleep apnea)    per pt has not used cpap several years   Pancreatic cyst    PONV (postoperative nausea and vomiting)    RLS (restless legs syndrome)    Seasonal and perennial allergic rhinitis    SUI (stress urinary incontinence, female)    Swallowing difficulty    Vitamin B12 deficiency    Vitamin D deficiency    Wears glasses     Past Surgical History:  Procedure Laterality Date   BLADDER SUSPENSION  1990's   sling   bladder tacking  2010   with mesh   BREAST LUMPECTOMY Left 1985   Benign cyst   BREAST REDUCTION  SURGERY Bilateral 2003 approx.   CORNEA LESION EXCISION Right 2005  approx.   CYSTOSCOPY N/A 07/12/2016   Procedure: CYSTOSCOPY, VAGINOSCOPY, EXAM UNDER ANESTHESIA, STONE LITHOTRIPSY,;  Surgeon: Cleon Gustin, MD;  Location: [facility];  Service: Urology;  Laterality: N/A;   CYSTOSCOPY N/A 10/11/2016   Procedure: CYSTOSCOPY;  Surgeon: Cleon Gustin, MD;  Location: [facility];  Service: Urology;  Laterality: N/A;   CYSTOSCOPY WITH LITHOLAPAXY N/A 10/11/2016   Procedure: CYSTOSCOPY WITH LITHOLAPAXY/ EXCISION OF MESH;  Surgeon: Cleon Gustin, MD;  Location: [facility];  Service: Urology;  Laterality: N/A;   CYSTOSCOPY WITH LITHOLAPAXY N/A 01/22/2019   Procedure: CYSTOSCOPY WITH LITHOLAPAXY;  Surgeon: Cleon Gustin, MD;  [facility];  Service: Urology;  Laterality: N/A;  1 HR   HOLMIUM LASER APPLICATION  2/37/6283   Procedure: HOLMIUM LASER APPLICATION;  Surgeon: Cleon Gustin, MD;  Location: [facility];  Service: Urology;;   HOLMIUM LASER APPLICATION N/A 1/51/7616   Procedure: HOLMIUM LASER APPLICATION;  Surgeon: Cleon Gustin, MD;  Location: [facility];  Service: Urology;  Laterality: N/A;   RIGHT KNEE ARTHROSCOPY  2013   TOTAL KNEE ARTHROPLASTY Right 09/12/2012   Procedure: RIGHT TOTAL KNEE ARTHROPLASTY;  Surgeon: Gearlean Alf, MD;  Location: [facility];  Service: Orthopedics;  Laterality: Right;   VAGINAL HYSTERECTOMY  1984     Current Outpatient Medications on File Prior to Visit  Medication Sig Dispense Refill   EPINEPHrine 0.3 mg/0.3 mL IJ SOAJ injection Inject 0.3 mg into the muscle as needed for anaphylaxis.     fluticasone (FLONASE) 50 MCG/ACT nasal spray Place into both nostrils.     gabapentin (NEURONTIN) 100 MG capsule TAKE 2 CAPSULES BY MOUTH AT BEDTIME. 180 capsule 0  levocetirizine (XYZAL) 5 MG tablet Take 5 mg by mouth every evening.   1    metFORMIN (GLUCOPHAGE) 500 MG tablet Take 1 tablet (500 mg total) by mouth daily with breakfast. 30 tablet 0   Misc Natural Products (TART CHERRY ADVANCED) CAPS Take 1 capsule by mouth. '1200mg'$  capsule     omeprazole (PRILOSEC) 20 MG capsule Take 20 mg by mouth 2 (two) times daily before a meal.     psyllium (REGULOID) 0.52 g capsule Take 0.52 g by mouth daily.     spironolactone (ALDACTONE) 25 MG tablet TAKE 1 TABLET (25 MG TOTAL) BY MOUTH DAILY. 90 tablet 1   traZODone (DESYREL) 50 MG tablet Take 1 tablet (50 mg total) by mouth at bedtime as needed for sleep. 90 tablet 1   escitalopram (LEXAPRO) 10 MG tablet Take 1 tablet (10 mg total) by mouth daily with breakfast. 30 tablet 0   No current facility-administered medications on file prior to visit.    Allergies  Allergen Reactions   Shellfish Allergy Hives   Cephalosporins Hives   Codeine     Other reaction(s): Unknown   Hydrochlorothiazide Hives   Misc. Sulfonamide Containing Compounds Hives   Penicillins Hives   Sulfa Antibiotics Hives   Sulfacetamide Sodium     Other reaction(s): Unknown    Physical exam:  Today's Vitals   01/14/22 1352  BP: (!) 123/58  Pulse: 78  Weight: 202 lb (91.6 kg)  Height: '5\' 5"'$  (1.651 m)   Body mass index is 33.61 kg/m.   Wt Readings from Last 3 Encounters:  01/14/22 202 lb (91.6 kg)  01/13/22 201 lb (91.2 kg)  01/13/22 201 lb (91.2 kg)     Ht Readings from Last 3 Encounters:  01/14/22 '5\' 5"'$  (1.651 m)  01/13/22 '5\' 5"'$  (1.651 m)  01/13/22 '5\' 5"'$  (1.651 m)      General: The patient is awake, alert and appears not in acute distress. The patient is well groomed. Head: Normocephalic, atraumatic. Neck is supple. Mallampati 1,  neck circumference:15.5 inches . Nasal airflow not fully patent.  Retrognathia is mild seen.  postnasal drip-  Dental status: lower jaw is crowded.   Cardiovascular:  Regular rate and cardiac rhythm by pulse,  without distended neck veins. Respiratory: Lungs are clear  to auscultation.  Skin:  Without evidence of ankle edema, or rash. Trunk: The patient's posture is erect.   Neurologic exam : The patient is awake and alert, oriented to place and time.   Memory subjective described as intact.  Attention span & concentration ability appears normal.  Speech is fluent,  without  dysarthria, dysphonia or aphasia.  Mood and affect are inappropriate pressured, impulsive, logorrhea.    Cranial nerves: no loss of smell or taste reported  Pupils are equal and briskly reactive to light. Funduscopic exam deferred. .  Extraocular movements in vertical and horizontal planes were intact and without nystagmus. No Diplopia. Visual fields by finger perimetry are intact. Hearing was impaired   Facial sensation intact to fine touch.  Facial motor strength is symmetric and tongue and uvula move midline.  Neck ROM : rotation, tilt and flexion extension were normal for age and shoulder shrug was symmetrical.    Motor exam:  Symmetric bulk, tone and ROM.   Normal tone without cog- wheeling, symmetric grip strength .   Sensory:  Fine touch and vibration intact in upper extremities.  Proprioception tested in the upper extremities was normal.   Coordination: Rapid alternating movements in the  fingers/hands were of normal speed.  The Finger-to-nose maneuver was intact without evidence of ataxia, dysmetria or tremor.   Gait and station: deferred.  Deep tendon reflexes: in the  upper and lower extremities are symmetric and intact.  Babinski response was deferred      After spending a total time of 25 minutes face to face and additional time for physical and neurologic examination, review of laboratory studies,  personal review of imaging studies, reports and results of other testing and review of referral information / records as far as provided in visit, I have established the following assessments:  1) insomnia related to living alone- she slept better when her husband  was alive. TV is a way to create a sound scape. She is afraid of dementia. Mind is racing, she is probably severely hyperactive-? Question of ADHD.  2) history of OSA, unknown degree - and no longer present according to PSG- AHI was under 5/h.   3) hopes to find help with weight loss when sleep is regulated, she feels she never sleeps enough and sleep perception is that of poor sleep. No hypersomnia, more fatigue.   4) dry mouth on OTC sleep aids.    My Plan is to proceed with:  1)Insomnia is her main problem. I have no physiological cause found for this.  There can be a partial contribution from Back pain and knee pain- she reports some RLS. Trial of mirapex prn . Lowest dose.  2) trazodone for sleep Maralyn Sago - this is also not fostering dementia. She takes OTC anticholinergic medications and those can cause memory problems- rather use trazodone .    liked restoril, but she is now in the black box category- Failed Ambien ( sleep acting, vivid dreams) , xanax- OTC benadryl, citrizine, relaxium,  3) may refer to CBT- hoarding is a symptom of anxiety and depression treatment with Psychology. Also worried about memory loss-  she reports some confusional arousal. 4) sleep hygiene, screen time  off- set a rise time, set a bed time, hot bath before bed, audio-book or medication tapes ( Calm). Booklet given to patient.   I would like to thank Dennard Nip, MD,  and Isaac Bliss, Rayford Halsted, Md [address] [place],   [id number] for allowing me to meet with and to take care of this pleasant patient.     In short, Cassandra [name] is presenting with some traits of hyperactivity, impulsivity, and likely anxiety resulting  insomnia,. I plan to refer to a psychological evaluation for this and memory evaluation- and possible CBT .   CC: I will share my notes with PCP.  Electronically signed by: Larey Seat, MD 01/14/2022 2:17 PM  [facility] [employer] Board certified by The [employer] of Sleep Medicine and Diplomate of the [employer] of Sleep Medicine. Board certified In Neurology through the [place], Fellow of the [employer] of Neurology. Medical Director of [employer].

## 2022-01-14 NOTE — Patient Instructions (Addendum)
Pramipexole Tablets What is this medication? PRAMIPEXOLE (pra mi PEX ole) treats the symptoms of Parkinson disease. It works by acting like dopamine, a substance in your body that helps manage movements and coordination. This reduces the symptoms of Parkinson, such as body stiffness and tremors. It may also be used to treat restless legs syndrome (RLS). This medicine may be used for other purposes; ask your health care provider or pharmacist if you have questions. COMMON BRAND NAME(S): Mirapex What should I tell my care team before I take this medication? They need to know if you have any of these conditions: Frequently drink alcohol Heart disease Kidney disease Low blood pressure Mental health condition Narcolepsy On hemodialysis Sleep apnea An unusual or allergic reaction to pramipexole, other medications, foods, dyes, or preservatives Pregnant or trying to get pregnant Breast-feeding How should I use this medication? Take this medication by mouth with water. Take it as directed on the prescription label. You can take it with or without food. If it upsets your stomach, take it with food. Keep taking this medication unless your care team tells you to stop. Stopping it too quickly can cause serious side effects. It can also make your condition worse. Talk to your care team about the use of this medication in children. Special care may be needed. Overdosage: If you think you have taken too much of this medicine contact a poison control center or emergency room at once. NOTE: This medicine is only for you. Do not share this medicine with others. What if I miss a dose? If you miss a dose, take it as soon as you can. If it is almost time for your next dose, take only that dose. Do not take double or extra doses. What may interact with this medication? Alcohol Antihistamines for allergy, cough and cold Certain medications for depression, anxiety, or mental health conditions Certain  medications for seizures, such as phenobarbital, primidone Certain medications for sleep General anesthetics, such as halothane, isoflurane, methoxyflurane, propofol Medications for blood pressure Medications that relax muscles for surgery Metoclopramide Opioid medications for pain This list may not describe all possible interactions. Give your health care provider a list of all the medicines, herbs, non-prescription drugs, or dietary supplements you use. Also tell them if you smoke, drink alcohol, or use illegal drugs. Some items may interact with your medicine. What should I watch for while using this medication? Visit your care team for regular checks on your progress. Tell your care team if your symptoms do not start to get better or if they get worse. Do not suddenly stop taking this medication. You may develop a severe reaction. Your care team will tell you how much medication to take. If your care team wants you to stop the medication, the dose may be slowly lowered over time to avoid any side effects. This medication may affect your coordination, reaction time, or judgement. Do not drive or operate machinery until you know how this medication affects you. Sit up or stand slowly to reduce the risk of dizzy or fainting spells. Drinking alcohol with this medication can increase the risk of these side effects. Your mouth may get dry. Chewing sugarless gum or sucking hard candy and drinking plenty of water may help. Contact your care team if the problem does not go away or is severe. When taking this medication, you may fall asleep without notice. You may be doing activities, such as driving a car, talking, or eating. You may not feel  drowsy before it happens. Contact your care team right away if this happens to you. There have been reports of increased sexual urges or other strong urges such as gambling while taking this medication. If you experience any of these while taking this medication, you  should report this to your care team as soon as possible. Talk with your care team if you have posture changes you cannot control. These may include your neck bending forward, your spine bending forward at the waist, or tilting sideways when you sit, stand, or walk. What side effects may I notice from receiving this medication? Side effects that you should report to your care team as soon as possible: Allergic reactions--skin rash, itching, hives, swelling of the face, lips, tongue, or throat Falling asleep during daily activities Liver injury--right upper belly pain, loss of appetite, nausea, light-colored stool, dark yellow or brown urine, yellowing skin or eyes, unusual weakness or fatigue Low blood pressure--dizziness, feeling faint or lightheaded, blurry vision Hallucinations Mood swings, irritability, hostility Muscle injury--unusual weakness or fatigue, muscle pain, dark yellow or brown urine, decrease in amount of urine New or worsening uncontrolled and repetitive movements of the face, mouth, or upper body Urges to engage in impulsive behaviors such as gambling, binge eating, sexual activity, or shopping in ways that are unusual for you Side effects that usually do not require medical attention (report to your care team if they continue or are bothersome): Confusion Constipation Dizziness Drowsiness Dry mouth Headache Nausea Trouble sleeping This list may not describe all possible side effects. Call your doctor for medical advice about side effects. You may report side effects to FDA at 1-800-FDA-1088. Where should I keep my medication? Keep out of the reach of children and pets. Store at room temperature between 20 and 25 degrees C (68 and 77 degrees F). Protect from light. Get rid of any unused medication after the expiration date. To get rid of medications that are no longer needed or have expired: Take the medication to a take-back program. Check with your pharmacy or law  enforcement to find a location. If you cannot return the medication, check the label or package insert to see if the medication should be thrown out in the garbage or flushed down the toilet. If you are not sure, ask your care team. If it is safe to put it in the trash, empty the medication out of the container. Mix the medication with cat litter, dirt, coffee grounds, or other unwanted substance. Seal the mixture in a bag or container. Put it in the trash. NOTE: This sheet is a summary. It may not cover all possible information. If you have questions about this medicine, talk to your doctor, pharmacist, or health care provider.  2023 Elsevier/Gold Standard (2021-08-04 00:00:00) Insomnia Insomnia is a sleep disorder that makes it difficult to fall asleep or stay asleep. Insomnia can cause fatigue, low energy, difficulty concentrating, mood swings, and poor performance at work or school. There are three different ways to classify insomnia: Difficulty falling asleep. Difficulty staying asleep. Waking up too early in the morning. Any type of insomnia can be long-term (chronic) or short-term (acute). Both are common. Short-term insomnia usually lasts for 3 months or less. Chronic insomnia occurs at least three times a week for longer than 3 months. What are the causes? Insomnia may be caused by another condition, situation, or substance, such as: Having certain mental health conditions, such as anxiety and depression. Using caffeine, alcohol, tobacco, or drugs. Having gastrointestinal conditions,  such as gastroesophageal reflux disease (GERD). Having certain medical conditions. These include: Asthma. Alzheimer's disease. Stroke. Chronic pain. An overactive thyroid gland (hyperthyroidism). Other sleep disorders, such as restless legs syndrome and sleep apnea. Menopause. Sometimes, the cause of insomnia may not be known. What increases the risk? Risk factors for insomnia include: Gender.  Females are affected more often than males. Age. Insomnia is more common as people get older. Stress and certain medical and mental health conditions. Lack of exercise. Having an irregular work schedule. This may include working night shifts and traveling between different time zones. What are the signs or symptoms? If you have insomnia, the main symptom is having trouble falling asleep or having trouble staying asleep. This may lead to other symptoms, such as: Feeling tired or having low energy. Feeling nervous about going to sleep. Not feeling rested in the morning. Having trouble concentrating. Feeling irritable, anxious, or depressed. How is this diagnosed? This condition may be diagnosed based on: Your symptoms and medical history. Your health care provider may ask about: Your sleep habits. Any medical conditions you have. Your mental health. A physical exam. How is this treated? Treatment for insomnia depends on the cause. Treatment may focus on treating an underlying condition that is causing the insomnia. Treatment may also include: Medicines to help you sleep. Counseling or therapy. Lifestyle adjustments to help you sleep better. Follow these instructions at home: Eating and drinking  Limit or avoid alcohol, caffeinated beverages, and products that contain nicotine and tobacco, especially close to bedtime. These can disrupt your sleep. Do not eat a large meal or eat spicy foods right before bedtime. This can lead to digestive discomfort that can make it hard for you to sleep. Sleep habits  Keep a sleep diary to help you and your health care provider figure out what could be causing your insomnia. Write down: When you sleep. When you wake up during the night. How well you sleep and how rested you feel the next day. Any side effects of medicines you are taking. What you eat and drink. Make your bedroom a dark, comfortable place where it is easy to fall asleep. Put up  shades or blackout curtains to block light from outside. Use a white noise machine to block noise. Keep the temperature cool. Limit screen use before bedtime. This includes: Not watching TV. Not using your smartphone, tablet, or computer. Stick to a routine that includes going to bed and waking up at the same times every day and night. This can help you fall asleep faster. Consider making a quiet activity, such as reading, part of your nighttime routine. Try to avoid taking naps during the day so that you sleep better at night. Get out of bed if you are still awake after 15 minutes of trying to sleep. Keep the lights down, but try reading or doing a quiet activity. When you feel sleepy, go back to bed. General instructions Take over-the-counter and prescription medicines only as told by your health care provider. Exercise regularly as told by your health care provider. However, avoid exercising in the hours right before bedtime. Use relaxation techniques to manage stress. Ask your health care provider to suggest some techniques that may work well for you. These may include: Breathing exercises. Routines to release muscle tension. Visualizing peaceful scenes. Make sure that you drive carefully. Do not drive if you feel very sleepy. Keep all follow-up visits. This is important. Contact a health care provider if: You are tired throughout the  day. You have trouble in your daily routine due to sleepiness. You continue to have sleep problems, or your sleep problems get worse. Get help right away if: You have thoughts about hurting yourself or someone else. Get help right away if you feel like you may hurt yourself or others, or have thoughts about taking your own life. Go to your nearest emergency room or: Call 911. Call the [place] at [phone] or 988. This is open 24 hours a day. Text the Crisis Text Line at 650-338-8455. Summary Insomnia is a sleep disorder that  makes it difficult to fall asleep or stay asleep. Insomnia can be long-term (chronic) or short-term (acute). Treatment for insomnia depends on the cause. Treatment may focus on treating an underlying condition that is causing the insomnia. Keep a sleep diary to help you and your health care provider figure out what could be causing your insomnia. This information is not intended to replace advice given to you by your health care provider. Make sure you discuss any questions you have with your health care provider. Document Revised: 04/27/2021 Document Reviewed: 04/27/2021 Elsevier Patient Education  2023 [place] is her main problem. I have no physiological cause found for this.  There can be a partial contribution from Back pain and knee pain- she reports some RLS.  2) trazodone for sleep Maralyn Sago - this is also not fostering dementia. She takes OTC anticholinergic medications and those can cause memory problems- rather use trazodone .    liked restoril, but she is now in the black box category- Failed Ambien ( sleep acting, vivid dreams) , xanax- OTC benadryl, citrizine, relaxium,  3) may refer to CBT- hoarding is a symptom of anxiety and depression treatment with Psychology. Also worried about memory loss-  she reports some confusional arousal. 4) sleep hygiene, screen time  off- set a rise time, set a bed time, hot bath before bed, audio-book or medication tapes ( Calm). Booklet given to patient.

## 2022-01-20 NOTE — Progress Notes (Signed)
Chief Complaint:   OBESITY Tychelle is here to discuss her progress with her obesity treatment plan along with follow-up of her obesity related diagnoses. Kimila is on the Category 1 Plan and the [place] and states she is following her eating plan approximately 75% of the time. Quintana states she is walking and doing chair exercise for 30 minutes 5 times per week.  Today's visit was #: 25 Starting weight: 193 lbs Starting date: 11/13/2019 Today's weight: 201 lbs Today's date: 01/13/2022 Total lbs lost to date: 0 Total lbs lost since last in-office visit: 0  Interim History: Sanyla continues to work on [employer] loss but she continues to struggle. Her RMR is low enough that she will have to follow her plan exactly to be able to lose weight.   Subjective:   1. Pre-diabetes Ople is on low dose metformin, and she is working on her diet.   2. Vitamin D deficiency Ivory is on Vitamin D, and her last level was not at goal.   3. Hyperlipidemia, unspecified hyperlipidemia type Pamala is trying to decreased her cholesterol in her diet. She is due to have labs.   4. Other depression, with emotional eating  Saroya is stable on Lexapro, and she is working on decreasing emotional eating behaviors.  Assessment/Plan:   1. Pre-diabetes We will check labs today, and we will refill metformin for 1 month. Taegen may need to increase metformin at her next visit.   - Vitamin B12 - CMP14+EGFR - Magnesium - Insulin, random - Hemoglobin A1c - metFORMIN (GLUCOPHAGE) 500 MG tablet; Take 1 tablet (500 mg total) by mouth daily with breakfast.  Dispense: 30 tablet; Refill: 0  2. Vitamin D deficiency We will check labs today. Anetha will follow-up for routine testing of Vitamin D, at least 2-3 times per year to avoid over-replacement.  - VITAMIN D 25 Hydroxy (Vit-D Deficiency, Fractures)  3. Hyperlipidemia, unspecified hyperlipidemia type We will check labs today. Chaelyn will continue to work on  diet, exercise and weight loss efforts. Orders and follow up as documented in patient record.   - Lipid Panel With LDL/HDL Ratio - TSH  4. Other depression, with emotional eating  Namya agreed to increase Lexapro 10 mg daily with no refills. Behavior modification techniques were discussed today to help Craig deal with her emotional/non-hunger eating behaviors.  Orders and follow up as documented in patient record.   - escitalopram (LEXAPRO) 10 MG tablet; Take 1 tablet (10 mg total) by mouth daily with breakfast.  Dispense: 30 tablet; Refill: 0  5. Obesity, Current BMI 33.4 Emryn is currently in the action stage of change. As such, her goal is to continue with weight loss efforts. She has agreed to the Category 1 Plan.   Exercise goals: As is.   Behavioral modification strategies: increasing lean protein intake and meal planning and cooking strategies.  Kasumi has agreed to follow-up with our clinic in 4 weeks. She was informed of the importance of frequent follow-up visits to maximize her success with intensive lifestyle modifications for her multiple health conditions.   Lucynda was informed we would discuss her lab results at her next visit unless there is a critical issue that needs to be addressed sooner. Reilyn agreed to keep her next visit at the agreed upon time to discuss these results.  Objective:   Blood pressure (!) 93/54, pulse 82, temperature 98.3 F (36.8 C), height '5\' 5"'  (1.651 m), weight 201 lb (91.2 kg), SpO2 96 %. Body  mass index is 33.45 kg/m.  General: Cooperative, alert, well developed, in no acute distress. HEENT: Conjunctivae and lids unremarkable. Cardiovascular: Regular rhythm.  Lungs: Normal work of breathing. Neurologic: No focal deficits.   Lab Results  Component Value Date   CREATININE 0.83 01/13/2022   BUN 12 01/13/2022   NA 140 01/13/2022   K 4.1 01/13/2022   CL 101 01/13/2022   CO2 23 01/13/2022   Lab Results  Component Value Date   ALT 7  01/13/2022   AST 20 01/13/2022   ALKPHOS 86 01/13/2022   BILITOT 0.4 01/13/2022   Lab Results  Component Value Date   HGBA1C 5.7 (H) 01/13/2022   HGBA1C 5.6 04/17/2020   HGBA1C 5.7 (H) 11/13/2019   HGBA1C 5.4 08/29/2019   HGBA1C 6.1 01/17/2019   Lab Results  Component Value Date   INSULIN 20.0 01/13/2022   INSULIN 24.4 07/21/2021   INSULIN 19.1 11/26/2020   INSULIN 15.9 04/17/2020   INSULIN 30.1 (H) 11/13/2019   Lab Results  Component Value Date   TSH 1.260 01/13/2022   Lab Results  Component Value Date   CHOL 161 01/13/2022   HDL 53 01/13/2022   LDLCALC 88 01/13/2022   TRIG 108 01/13/2022   CHOLHDL 3.0 11/26/2020   Lab Results  Component Value Date   VD25OH 55.1 01/13/2022   VD25OH 69.4 07/21/2021   VD25OH 45.1 11/26/2020   Lab Results  Component Value Date   WBC 7.5 07/21/2021   HGB 13.5 07/21/2021   HCT 41.4 07/21/2021   MCV 83 07/21/2021   PLT 290 07/21/2021   Lab Results  Component Value Date   IRON 56 07/21/2021   TIBC 449 07/21/2021   FERRITIN 11 (L) 07/21/2021    Obesity Behavioral Intervention:   Approximately 15 minutes were spent on the discussion below.  ASK: We discussed the diagnosis of obesity with [name] today and Timberlee agreed to give [place] permission to discuss obesity behavioral modification therapy today.  ASSESS: Kysha has the diagnosis of obesity and her BMI today is 33.4. Katricia is in the action stage of change.   ADVISE: Alexzia was educated on the multiple health risks of obesity as well as the benefit of weight loss to improve her health. She was advised of the need for long term treatment and the importance of lifestyle modifications to improve her current health and to decrease her risk of future health problems.  AGREE: Multiple dietary modification options and treatment options were discussed and Loanne agreed to follow the recommendations documented in the above note.  ARRANGE: Countess was educated on the importance of  frequent visits to treat obesity as outlined per CMS and USPSTF guidelines and agreed to schedule her next follow up appointment today.  Attestation Statements:   Reviewed by clinician on day of visit: allergies, medications, problem list, medical history, surgical history, family history, social history, and previous encounter notes.   I, Trixie Dredge, am acting as transcriptionist for Dennard Nip, MD.  I have reviewed the above documentation for accuracy and completeness, and I agree with the above. -  Dennard Nip, MD

## 2022-01-21 ENCOUNTER — Encounter: Payer: Self-pay | Admitting: Family Medicine

## 2022-01-21 DIAGNOSIS — J3089 Other allergic rhinitis: Secondary | ICD-10-CM | POA: Diagnosis not present

## 2022-01-21 DIAGNOSIS — J3081 Allergic rhinitis due to animal (cat) (dog) hair and dander: Secondary | ICD-10-CM | POA: Diagnosis not present

## 2022-01-21 DIAGNOSIS — J301 Allergic rhinitis due to pollen: Secondary | ICD-10-CM | POA: Diagnosis not present

## 2022-01-22 ENCOUNTER — Ambulatory Visit (INDEPENDENT_AMBULATORY_CARE_PROVIDER_SITE_OTHER): Payer: PPO

## 2022-01-22 ENCOUNTER — Ambulatory Visit: Payer: PPO

## 2022-01-22 ENCOUNTER — Ambulatory Visit: Payer: PPO | Admitting: Sports Medicine

## 2022-01-22 VITALS — BP 118/80 | HR 100 | Ht 65.0 in | Wt 202.0 lb

## 2022-01-22 DIAGNOSIS — M25561 Pain in right knee: Secondary | ICD-10-CM

## 2022-01-22 DIAGNOSIS — M25551 Pain in right hip: Secondary | ICD-10-CM

## 2022-01-22 DIAGNOSIS — M25562 Pain in left knee: Secondary | ICD-10-CM

## 2022-01-22 DIAGNOSIS — R0781 Pleurodynia: Secondary | ICD-10-CM

## 2022-01-22 DIAGNOSIS — M1711 Unilateral primary osteoarthritis, right knee: Secondary | ICD-10-CM | POA: Diagnosis not present

## 2022-01-22 DIAGNOSIS — G8929 Other chronic pain: Secondary | ICD-10-CM

## 2022-01-22 DIAGNOSIS — M1712 Unilateral primary osteoarthritis, left knee: Secondary | ICD-10-CM | POA: Diagnosis not present

## 2022-01-22 MED ORDER — MELOXICAM 15 MG PO TABS: 15.0000 mg | ORAL_TABLET | Freq: Every day | ORAL | 0 refills | Status: DC

## 2022-01-22 NOTE — Patient Instructions (Addendum)
Good to see you  - Start meloxicam 15 mg daily x2 weeks.  If still having pain after 2 weeks, complete 3rd-week of meloxicam. May use remaining meloxicam as needed once daily for pain control.  Do not to use additional NSAIDs while taking meloxicam.  May use Tylenol [phone] mg 2 to 3 times a day for breakthrough pain. As needed follow up

## 2022-01-22 NOTE — Progress Notes (Signed)
Cassandra [name] D.[facility] [place] [place] Phone: [phone]   Assessment and Plan:    1. Rib pain on left side 2. Chronic pain of both knees 3. Right hip pain - Chronic with exacerbation, initial sports medicine visit - Flare of multiple musculoskeletal pains after recent fall - Images obtained in clinic.  My interpretation: No acute fracture or dislocation.  No sign of rib fracture or pneumothorax.  Right knee with history of total knee replacement appears intact.  Left knee shows significantly decreased medial compartment space and cortical changes.  Hyperdense area in distal left femur that appears consistent with prior knee images without change in location, size, shape.  Cortical changes and decreased joint space to right hip with lateral density to hip.  Will await radiology for official review. - Start meloxicam 15 mg daily x2 weeks.  If still having pain after 2 weeks, complete 3rd-week of meloxicam. May use remaining meloxicam as needed once daily for pain control.  Do not to use additional NSAIDs while taking meloxicam.  May use Tylenol [phone] mg 2 to 3 times a day for breakthrough pain. - Recommend relative rest and may advance activity as tolerated   Pertinent previous records reviewed include none   Follow Up: As needed if no improvement or worsening of symptoms.  Would target future treatment based on patient's presenting symptoms   Subjective:   I, Cassandra [name], am serving as a Education administrator for Doctor Glennon Mac  Chief Complaint: left rib and knee pain   HPI:   01/22/22 Patient is a 76 year old female complaining of left rib and knee pain . Patient states that Sunday morning she fell and hit her knee and her dresser jabbed her in the ribs , hurts to take a deep breath , driving is hard, shes is really sore , has some bruising and she hurts all the way across, she fell on both knees , right knee feels  swollen, left knee has been using her brace   Relevant Historical Information: Total right knee replacement, fibromyalgia  Additional pertinent review of systems negative.   Current Outpatient Medications:    EPINEPHrine 0.3 mg/0.3 mL IJ SOAJ injection, Inject 0.3 mg into the muscle as needed for anaphylaxis., Disp: , Rfl:    escitalopram (LEXAPRO) 10 MG tablet, Take 1 tablet (10 mg total) by mouth daily with breakfast., Disp: 30 tablet, Rfl: 0   fluticasone (FLONASE) 50 MCG/ACT nasal spray, Place into both nostrils., Disp: , Rfl:    gabapentin (NEURONTIN) 100 MG capsule, TAKE 2 CAPSULES BY MOUTH AT BEDTIME., Disp: 180 capsule, Rfl: 0   levocetirizine (XYZAL) 5 MG tablet, Take 5 mg by mouth every evening. , Disp: , Rfl: 1   metFORMIN (GLUCOPHAGE) 500 MG tablet, Take 1 tablet (500 mg total) by mouth daily with breakfast., Disp: 30 tablet, Rfl: 0   Misc Natural Products (TART CHERRY ADVANCED) CAPS, Take 1 capsule by mouth. '1200mg'$  capsule, Disp: , Rfl:    omeprazole (PRILOSEC) 20 MG capsule, Take 20 mg by mouth 2 (two) times daily before a meal., Disp: , Rfl:    pramipexole (MIRAPEX) 0.25 MG tablet, Prn at night for RLS, Disp: 30 tablet, Rfl: 5   psyllium (REGULOID) 0.52 g capsule, Take 0.52 g by mouth daily., Disp: , Rfl:    spironolactone (ALDACTONE) 25 MG tablet, TAKE 1 TABLET (25 MG TOTAL) BY MOUTH DAILY., Disp: 90 tablet, Rfl: 1   traZODone (DESYREL) 50  MG tablet, Take 1 tablet (50 mg total) by mouth at bedtime as needed for sleep., Disp: 90 tablet, Rfl: 1   Objective:     Vitals:   01/22/22 0940  BP: 118/80  Pulse: 100  SpO2: 100%  Weight: 202 lb (91.6 kg)  Height: '5\' 5"'$  (1.651 m)      Body mass index is 33.61 kg/m.    Physical Exam:    General:  awake, alert oriented, no acute distress nontoxic Skin: no suspicious lesions or rashes Neuro:sensation intact, no deficits, strength 5/5 with no deficits, no atrophy, normal muscle tone Psych: No signs of anxiety, depression or  other mood disorder  Bilateral knee: No swelling No deformity Neg fluid wave, joint milking ROM Flex 100, Ext 5 TTP patella NTTP over the quad tendon, medial fem condyle, lat fem condyle, patella, plica, patella tendon, tibial tuberostiy, fibular head, posterior fossa, pes anserine bursa, gerdy's tubercle, medial jt line, lateral jt line   Gait normal   General: awake, alert, and oriented no acute distress, nontoxic Skin: no suspicious lesions or rashes Neuro:sensation intact distally with no dificits, normal muscle tone, no atrophy, strength 5/5 in all tested lower ext groups Psych: normal mood and affect, speech clear  Right hip: No deformity, swelling or wasting ROM Flexion 90, ext 30, IR 45, ER 45 NTTP over the hip flexors, greater troch, glute musculature, si joint, lumbar spine Negative log roll with FROM Negative FABER Negative FADIR  TTP left anterior rib, midclavicular line ribs 6-8 with superficial ecchymosis.  No flail chest  Electronically signed by:  Cassandra [name] D.[name] 10:15 AM 01/22/22

## 2022-01-26 DIAGNOSIS — J3081 Allergic rhinitis due to animal (cat) (dog) hair and dander: Secondary | ICD-10-CM | POA: Diagnosis not present

## 2022-01-26 DIAGNOSIS — J301 Allergic rhinitis due to pollen: Secondary | ICD-10-CM | POA: Diagnosis not present

## 2022-01-26 DIAGNOSIS — J3089 Other allergic rhinitis: Secondary | ICD-10-CM | POA: Diagnosis not present

## 2022-01-27 ENCOUNTER — Ambulatory Visit: Payer: PPO | Admitting: Family Medicine

## 2022-02-05 ENCOUNTER — Other Ambulatory Visit (INDEPENDENT_AMBULATORY_CARE_PROVIDER_SITE_OTHER): Payer: Self-pay | Admitting: Family Medicine

## 2022-02-05 DIAGNOSIS — R7303 Prediabetes: Secondary | ICD-10-CM

## 2022-02-05 DIAGNOSIS — F3289 Other specified depressive episodes: Secondary | ICD-10-CM

## 2022-02-16 ENCOUNTER — Telehealth (INDEPENDENT_AMBULATORY_CARE_PROVIDER_SITE_OTHER): Payer: PPO | Admitting: Family Medicine

## 2022-02-16 ENCOUNTER — Encounter (INDEPENDENT_AMBULATORY_CARE_PROVIDER_SITE_OTHER): Payer: Self-pay | Admitting: Family Medicine

## 2022-02-16 DIAGNOSIS — F3289 Other specified depressive episodes: Secondary | ICD-10-CM

## 2022-02-16 DIAGNOSIS — E669 Obesity, unspecified: Secondary | ICD-10-CM | POA: Diagnosis not present

## 2022-02-16 DIAGNOSIS — G4709 Other insomnia: Secondary | ICD-10-CM

## 2022-02-16 DIAGNOSIS — Z6833 Body mass index (BMI) 33.0-33.9, adult: Secondary | ICD-10-CM

## 2022-02-16 DIAGNOSIS — R7303 Prediabetes: Secondary | ICD-10-CM

## 2022-02-16 MED ORDER — METFORMIN HCL 500 MG PO TABS: 500.0000 mg | ORAL_TABLET | Freq: Every day | ORAL | 0 refills | Status: DC

## 2022-02-16 MED ORDER — ESCITALOPRAM OXALATE 10 MG PO TABS: 10.0000 mg | ORAL_TABLET | Freq: Every day | ORAL | 0 refills | Status: DC

## 2022-02-16 NOTE — Progress Notes (Signed)
TeleHealth Visit:  This visit was completed with telemedicine (audio/video) technology. Cassandra [name] has verbally consented to this TeleHealth visit. The patient is located at home, the provider is located at home. The participants in this visit include the listed provider and patient. The visit was conducted today via MyChart video.  OBESITY Cassandra [name] is here to discuss her progress with her obesity treatment plan along with follow-up of her obesity related diagnoses.   Today's visit was # 26 Starting weight: 193 lbs Starting date: 11/13/2019 Weight at last in office visit: 201 lbs on 01/13/22 Total weight loss: 0 lbs at last in office visit on 01/13/22. Today's reported weight: 197 lbs   Nutrition Plan: the Category 1 Plan- 50-75% on plan.  Current exercise: she is walking 5000 steps/daily and doing chair exercise one day per week.  Interim History: Cassandra [name] is unhappy with her progress with overall weight loss.   She says she loses and gains and sometimes thinks about quitting our clinic.  She recently went to the beach and did not stay on plan.   She tries to make good choices.   She does tend to skip meals.  She generally has 2 meals per day.  Water intake is good but she does get up 3 times per night to urinate. Reports snacking on ginger snaps.  Assessment/Plan:  We went over her lab results in depth today.  1. Prediabetes A1c was 5.7 on 01/13/2022 up from 5.6 in February.  Her mom has diabetes and she is concerned about becoming diabetic. Denies polyphagia.  Says she has to force herself to eat. Medication(s): Metformin 500 mg daily. Lab Results  Component Value Date   HGBA1C 5.7 (H) 01/13/2022   Lab Results  Component Value Date   INSULIN 20.0 01/13/2022   INSULIN 24.4 07/21/2021   INSULIN 19.1 11/26/2020   INSULIN 15.9 04/17/2020   INSULIN 30.1 (H) 11/13/2019    Plan: Refill metformin 500 mg daily. Work on reducing simple carbohydrates in diet.   2.  Other  depression/emotional eating Cassandra [name] has had issues with stress/emotional eating.  She was widowed 11 years ago but feels like it was just yesterday.  She has children and grandchildren who live close by.  Says she has always been hyperactive. Eating simple carbs such as ginger snaps. Currently this is moderately controlled. Overall mood is stable.  Medication(s): Lexapro 10 mg daily.  She is unsure if this helps with mood but want to continue taking it.  Plan: Refill Lexapro 10 mg daily.  3.  Other insomnia She sleeps only about 4-1/2 hours per night.  She was prescribed trazodone by Dr. Brett Fairy but reports this makes her very dizzy when she gets up to urinate at night (3 times per night).  She has fallen once.  She does not think the trazodone has helped much with sleep.  Has tried several over-the-counter sleep aids and none have worked.  She does not nap during the day.  Plan: Discuss insomnia with PCP at upcoming appointment. Discontinue trazodone.  4. Obesity: Current BMI 33.4 Cassandra [name] is currently in the action stage of change. As such, her goal is to continue with weight loss efforts.  She has agreed to keeping a food journal and adhering to recommended goals of 1000-1100 calories and 75 gms  protein. Daily.  Track food intake on the Lose It app.  Exercise goals: as is  Behavioral modification strategies: increasing lean protein intake, decreasing simple carbohydrates, no skipping meals, and keeping a strict food  journal.  Brette has agreed to follow-up with our clinic in 4 weeks.   No orders of the defined types were placed in this encounter.   Medications Discontinued During This Encounter  Medication Reason   traZODone (DESYREL) 50 MG tablet Side effect (s)   metFORMIN (GLUCOPHAGE) 500 MG tablet Reorder   escitalopram (LEXAPRO) 10 MG tablet Reorder     Meds ordered this encounter  Medications   metFORMIN (GLUCOPHAGE) 500 MG tablet    Sig: Take 1 tablet (500 mg total) by  mouth daily with breakfast.    Dispense:  30 tablet    Refill:  0    Order Specific Question:   Supervising Provider    Answer:   Dennard Nip D [[id number]]   escitalopram (LEXAPRO) 10 MG tablet    Sig: Take 1 tablet (10 mg total) by mouth daily with breakfast.    Dispense:  30 tablet    Refill:  0    Order Specific Question:   Supervising Provider    Answer:   Dennard Nip D [[id number]]      Objective:   VITALS: Per patient if applicable, see vitals. GENERAL: Alert and in no acute distress. CARDIOPULMONARY: No increased WOB. Speaking in clear sentences.  PSYCH: Pleasant and cooperative. Speech normal rate and rhythm. Affect is appropriate. Insight and judgement are appropriate. Attention is focused, linear, and appropriate.  NEURO: Oriented as arrived to appointment on time with no prompting.   Lab Results  Component Value Date   CREATININE 0.83 01/13/2022   BUN 12 01/13/2022   NA 140 01/13/2022   K 4.1 01/13/2022   CL 101 01/13/2022   CO2 23 01/13/2022   Lab Results  Component Value Date   ALT 7 01/13/2022   AST 20 01/13/2022   ALKPHOS 86 01/13/2022   BILITOT 0.4 01/13/2022   Lab Results  Component Value Date   HGBA1C 5.7 (H) 01/13/2022   HGBA1C 5.6 04/17/2020   HGBA1C 5.7 (H) 11/13/2019   HGBA1C 5.4 08/29/2019   HGBA1C 6.1 01/17/2019   Lab Results  Component Value Date   INSULIN 20.0 01/13/2022   INSULIN 24.4 07/21/2021   INSULIN 19.1 11/26/2020   INSULIN 15.9 04/17/2020   INSULIN 30.1 (H) 11/13/2019   Lab Results  Component Value Date   TSH 1.260 01/13/2022   Lab Results  Component Value Date   CHOL 161 01/13/2022   HDL 53 01/13/2022   LDLCALC 88 01/13/2022   TRIG 108 01/13/2022   CHOLHDL 3.0 11/26/2020   Lab Results  Component Value Date   WBC 7.5 07/21/2021   HGB 13.5 07/21/2021   HCT 41.4 07/21/2021   MCV 83 07/21/2021   PLT 290 07/21/2021   Lab Results  Component Value Date   IRON 56 07/21/2021   TIBC 449 07/21/2021   FERRITIN 11 (L)  07/21/2021   Lab Results  Component Value Date   VD25OH 55.1 01/13/2022   VD25OH 69.4 07/21/2021   VD25OH 45.1 11/26/2020    Attestation Statements:   Reviewed by clinician on day of visit: allergies, medications, problem list, medical history, surgical history, family history, social history, and previous encounter notes.

## 2022-02-22 ENCOUNTER — Encounter: Payer: Self-pay | Admitting: Internal Medicine

## 2022-02-23 ENCOUNTER — Encounter: Payer: Self-pay | Admitting: Internal Medicine

## 2022-02-23 NOTE — Progress Notes (Unsigned)
Cassandra  [name] [address] [place] Phone: [phone] Subjective:   [name], am serving as a scribe for Dr. Hulan Saas.  I'm seeing this patient by the request  of:  Isaac Bliss, Rayford Halsted, MD  CC: Left knee pain, rib pain follow-up  PYP:[id number]  01/13/2022 Likely arthritis with some mild strain noted.  Patient does have pain on the calf and seems to be more patient does have significant anxiety and there is a trace amount of swelling of the lower extremity compared to the contralateral side so we will get a Doppler done to further evaluate tomorrow.  Patient knows if worsening pain or shortness of breath to seek medical attention.  Patient should do relatively well though this is likely just a formality.  Follow-up with me again in 6 to 8 weeks and if worsening can consider injection into the knee.  Updated 02/24/2022 Cassandra [name] is a 76 y.o. female coming in with complaint of L knee pain. Knee still clicks, but doing better. Wasn't feeling too hot these last two weeks. Ribs are still sore from fall. Would like to talk about her vertigo medication.  This was patient's trazodone.  Did not feel that meloxicam helped very much with her rib pain.  States that it seemed like more time than anything else helped.  Patient denies any shortness of breath.  States that certain motions can still give her some discomfort.       Past Medical History:  Diagnosis Date   Anemia    Anxiety    Asthma    Back pain    BPPV (benign paroxysmal positional vertigo)    Chronic allergic conjunctivitis    Depression    Diverticulosis    Fibromyalgia    Gallbladder problem    Gallstones    GERD (gastroesophageal reflux disease)    Hiatal hernia    History of bladder stone    Iron (Fe) deficiency anemia    Joint pain    Lump in female breast    Mild persistent asthma    followed by pcp--- dr Alfonso Patten. sharma ([place] allergy/asthma)   OA  (osteoarthritis)    knees, hands   OAB (overactive bladder)    OSA (obstructive sleep apnea)    per pt has not used cpap several years   Pancreatic cyst    PONV (postoperative nausea and vomiting)    RLS (restless legs syndrome)    Seasonal and perennial allergic rhinitis    SUI (stress urinary incontinence, female)    Swallowing difficulty    Vitamin B12 deficiency    Vitamin D deficiency    Wears glasses    Past Surgical History:  Procedure Laterality Date   BLADDER SUSPENSION  1990's   sling   bladder tacking  2010   with mesh   BREAST LUMPECTOMY Left 1985   Benign cyst   BREAST REDUCTION SURGERY Bilateral 2003 approx.   CORNEA LESION EXCISION Right 2005  approx.   CYSTOSCOPY N/A 07/12/2016   Procedure: CYSTOSCOPY, VAGINOSCOPY, EXAM UNDER ANESTHESIA, STONE LITHOTRIPSY,;  Surgeon: Cleon Gustin, MD;  Location: [facility];  Service: Urology;  Laterality: N/A;   CYSTOSCOPY N/A 10/11/2016   Procedure: CYSTOSCOPY;  Surgeon: Cleon Gustin, MD;  Location: [facility];  Service: Urology;  Laterality: N/A;   CYSTOSCOPY WITH LITHOLAPAXY N/A 10/11/2016   Procedure: CYSTOSCOPY WITH LITHOLAPAXY/ EXCISION OF MESH;  Surgeon: Cleon Gustin, MD;  Location:  [place] [facility];  Service: Urology;  Laterality: N/A;   CYSTOSCOPY WITH LITHOLAPAXY N/A 01/22/2019   Procedure: CYSTOSCOPY WITH LITHOLAPAXY;  Surgeon: Cleon Gustin, MD;  Location: [facility];  Service: Urology;  Laterality: N/A;  1 HR   HOLMIUM LASER APPLICATION  0/97/3532   Procedure: HOLMIUM LASER APPLICATION;  Surgeon: Cleon Gustin, MD;  Location: [facility];  Service: Urology;;   HOLMIUM LASER APPLICATION N/A 9/92/4268   Procedure: HOLMIUM LASER APPLICATION;  Surgeon: Cleon Gustin, MD;  Location: [facility];  Service: Urology;  Laterality: N/A;   RIGHT KNEE ARTHROSCOPY  2013   TOTAL KNEE ARTHROPLASTY Right 09/12/2012    Procedure: RIGHT TOTAL KNEE ARTHROPLASTY;  Surgeon: Gearlean Alf, MD;  Location: [facility];  Service: Orthopedics;  Laterality: Right;   VAGINAL HYSTERECTOMY  1984   Social History   Socioeconomic History   Marital status: Widowed    Spouse name: Not on file   Number of children: 2   Years of education: Not on file   Highest education level: Bachelor's degree (e.g., BA, AB, BS)  Occupational History   Occupation: Biochemist, clinical Retired  Tobacco Use   Smoking status: Never   Smokeless tobacco: Never  Vaping Use   Vaping Use: Never used  Substance and Sexual Activity   Alcohol use: Yes    Comment: 1x monthly   Drug use: Never   Sexual activity: Not on file  Other Topics Concern   Not on file  Social History Narrative   Lives alone   R handed   Caffeine: 1 C a day   Social Determinants of Health   Financial Resource Strain: Not on file  Food Insecurity: Not on file  Transportation Needs: Not on file  Physical Activity: Not on file  Stress: Not on file  Social Connections: Not on file   Allergies  Allergen Reactions   Shellfish Allergy Hives   Cephalosporins Hives   Codeine     Other reaction(s): Unknown   Hydrochlorothiazide Hives   Misc. Sulfonamide Containing Compounds Hives   Penicillins Hives   Sulfa Antibiotics Hives   Sulfacetamide Sodium     Other reaction(s): Unknown   Family History  Problem Relation Age of Onset   Kidney cancer Mother    Diabetes Mother    Obesity Mother    Dementia Mother        died age 90   Other Father        burn victum   Alcoholism Father    Colon cancer Neg Hx    Inflammatory bowel disease Neg Hx    Liver disease Neg Hx    Pancreatic cancer Neg Hx    Stomach cancer Neg Hx    Rectal cancer Neg Hx     Current Outpatient Medications (Endocrine & Metabolic):    metFORMIN (GLUCOPHAGE) 500 MG tablet, Take 1 tablet (500 mg total) by mouth daily with breakfast.  Current Outpatient Medications  (Cardiovascular):    EPINEPHrine 0.3 mg/0.3 mL IJ SOAJ injection, Inject 0.3 mg into the muscle as needed for anaphylaxis.   spironolactone (ALDACTONE) 25 MG tablet, TAKE 1 TABLET (25 MG TOTAL) BY MOUTH DAILY.  Current Outpatient Medications (Respiratory):    fluticasone (FLONASE) 50 MCG/ACT nasal spray, Place into both nostrils.   levocetirizine (XYZAL) 5 MG tablet, Take 5 mg by mouth every evening.   Current Outpatient Medications (Analgesics):    meloxicam (MOBIC) 15 MG tablet, Take 1 tablet (15  mg total) by mouth daily.   Current Outpatient Medications (Other):    escitalopram (LEXAPRO) 10 MG tablet, Take 1 tablet (10 mg total) by mouth daily with breakfast.   gabapentin (NEURONTIN) 100 MG capsule, TAKE 2 CAPSULES BY MOUTH AT BEDTIME.   meclizine (ANTIVERT) 25 MG tablet, Take 1 tablet (25 mg total) by mouth 3 (three) times daily as needed for dizziness.   Misc Natural Products (TART CHERRY ADVANCED) CAPS, Take 1 capsule by mouth. '1200mg'$  capsule   omeprazole (PRILOSEC) 20 MG capsule, Take 20 mg by mouth 2 (two) times daily before a meal.   pramipexole (MIRAPEX) 0.25 MG tablet, Prn at night for RLS   psyllium (REGULOID) 0.52 g capsule, Take 0.52 g by mouth daily.    Objective  Blood pressure 128/84, pulse 97, height '5\' 5"'$  (1.651 m), weight 196 lb (88.9 kg), SpO2 97 %.   General: No apparent distress alert and oriented x3 mood and affect normal, dressed appropriately.  HEENT: Pupils equal, extraocular movements intact  Respiratory: Patient's speak in full sentences and does not appear short of breath  Cardiovascular: No lower extremity edema, non tender, no erythema  Left knee exam does still have a trace effusion noted.  Mild crepitus noted.  Good range of motion though noted.  Left rib cage does not have any true swelling or discoloration noted.  Minorly tender at the costal margin on the anterior lateral aspect    Impression and Recommendations:     The above documentation has  been reviewed and is accurate and complete Lyndal Pulley, DO

## 2022-02-24 ENCOUNTER — Ambulatory Visit: Payer: PPO | Admitting: Family Medicine

## 2022-02-24 ENCOUNTER — Encounter: Payer: Self-pay | Admitting: Family Medicine

## 2022-02-24 DIAGNOSIS — M1712 Unilateral primary osteoarthritis, left knee: Secondary | ICD-10-CM

## 2022-02-24 MED ORDER — MECLIZINE HCL 25 MG PO TABS: 25.0000 mg | ORAL_TABLET | Freq: Three times a day (TID) | ORAL | 0 refills | Status: DC | PRN

## 2022-02-24 NOTE — Assessment & Plan Note (Signed)
Significant arthritis but doing well with the viscosupplementation.  We did discuss that it has been 6 months since we have done the viscosupplementation and can repeat if needed.  We discussed which activities to do and which ones to avoid.  Increase activity slowly otherwise.  Follow-up again in 6 to 8 weeks

## 2022-02-24 NOTE — Patient Instructions (Signed)
Good to see you! Looks like the knees doing really good Arnica lotion

## 2022-03-11 ENCOUNTER — Other Ambulatory Visit (INDEPENDENT_AMBULATORY_CARE_PROVIDER_SITE_OTHER): Payer: Self-pay | Admitting: Family Medicine

## 2022-03-11 DIAGNOSIS — R7303 Prediabetes: Secondary | ICD-10-CM

## 2022-03-11 DIAGNOSIS — F3289 Other specified depressive episodes: Secondary | ICD-10-CM

## 2022-03-16 ENCOUNTER — Encounter (INDEPENDENT_AMBULATORY_CARE_PROVIDER_SITE_OTHER): Payer: Self-pay | Admitting: Family Medicine

## 2022-03-16 ENCOUNTER — Ambulatory Visit (INDEPENDENT_AMBULATORY_CARE_PROVIDER_SITE_OTHER): Payer: PPO | Admitting: Family Medicine

## 2022-03-16 VITALS — BP 104/56 | HR 64 | Temp 98.9°F | Ht 65.0 in | Wt 195.0 lb

## 2022-03-16 DIAGNOSIS — R42 Dizziness and giddiness: Secondary | ICD-10-CM

## 2022-03-16 DIAGNOSIS — E669 Obesity, unspecified: Secondary | ICD-10-CM | POA: Diagnosis not present

## 2022-03-16 DIAGNOSIS — Z6832 Body mass index (BMI) 32.0-32.9, adult: Secondary | ICD-10-CM

## 2022-03-16 DIAGNOSIS — R7303 Prediabetes: Secondary | ICD-10-CM

## 2022-03-16 DIAGNOSIS — F3289 Other specified depressive episodes: Secondary | ICD-10-CM | POA: Diagnosis not present

## 2022-03-16 MED ORDER — ESCITALOPRAM OXALATE 10 MG PO TABS: 10.0000 mg | ORAL_TABLET | Freq: Every day | ORAL | 1 refills | Status: DC

## 2022-03-16 MED ORDER — METFORMIN HCL 500 MG PO TABS
ORAL_TABLET | ORAL | 1 refills | Status: DC
Start: 2022-03-16 — End: 2022-04-06

## 2022-03-17 IMAGING — DX DG HIP (WITH OR WITHOUT PELVIS) 2-3V*R*
3 series · 3 of 3 positions shown · non-contrast
Comparison: None.

CLINICAL DATA: Right hip pain. Patient reports hip giving out as
well as groin pain. Pain is chronic. No specific injury.

EXAM:
DG HIP (WITH OR WITHOUT PELVIS) 2-3V RIGHT

[pelvis ap]
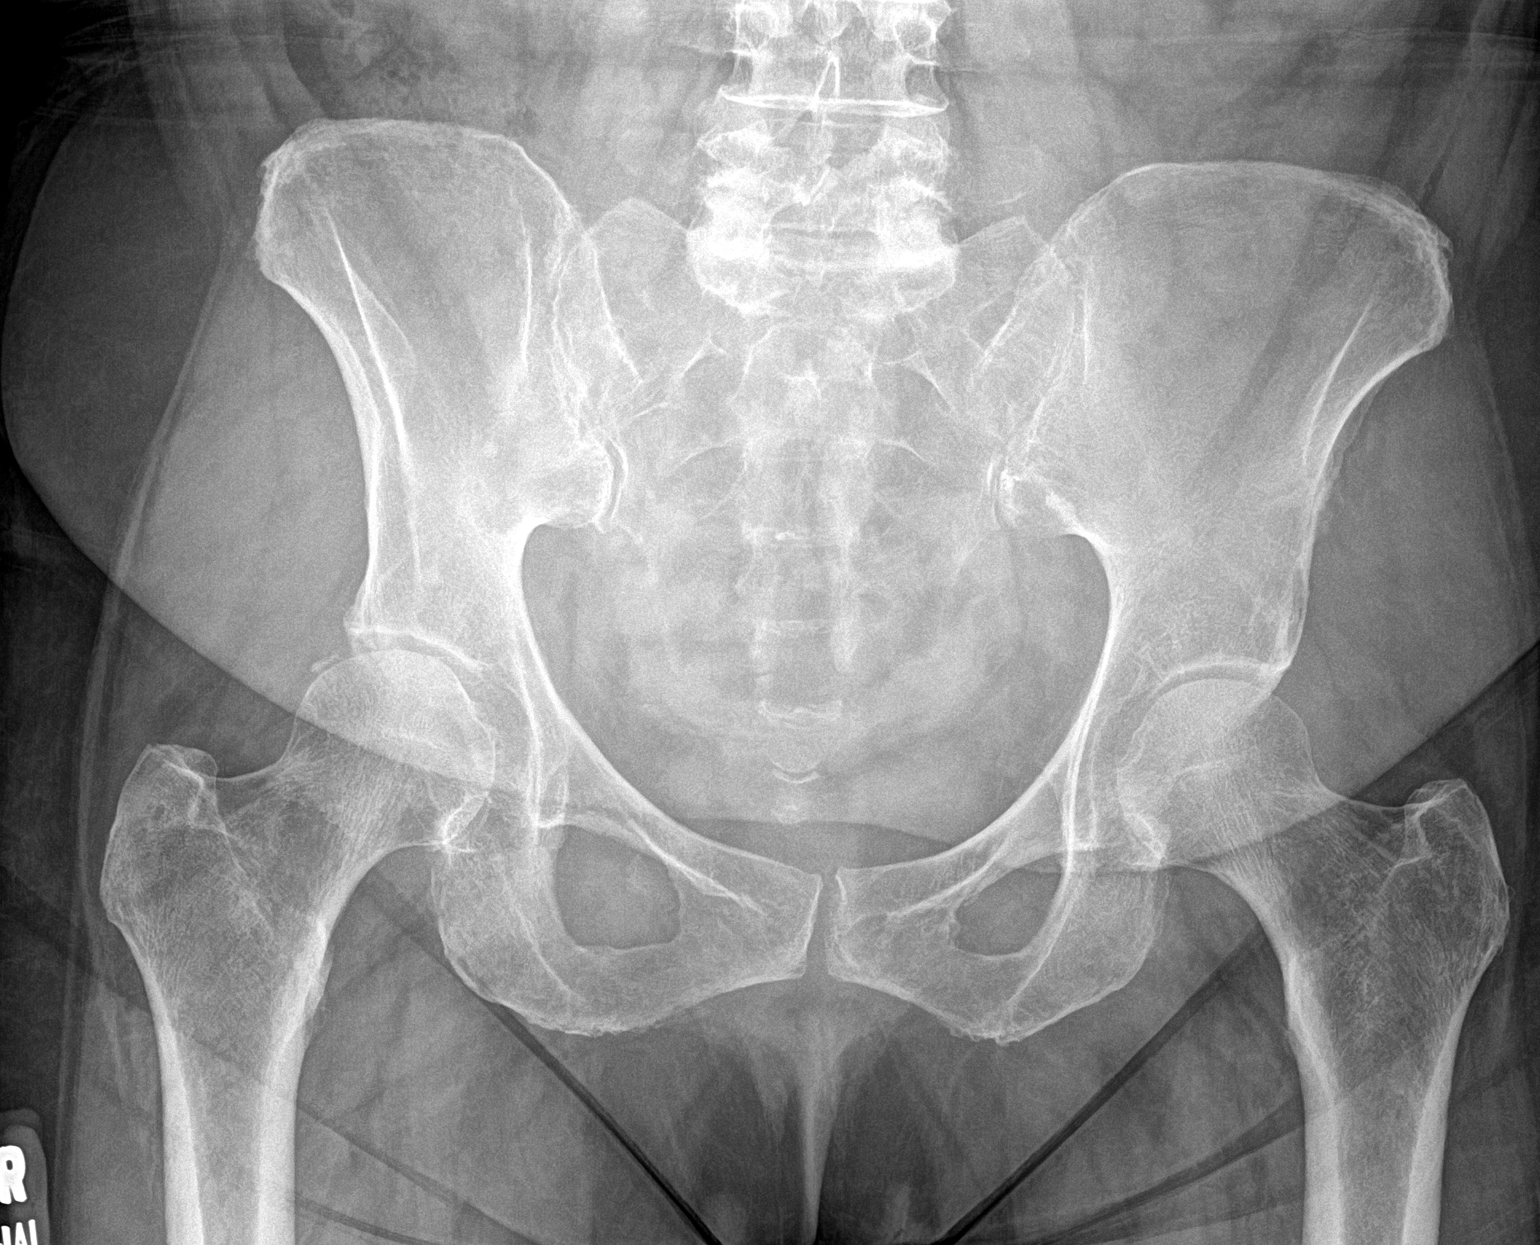

[hip ap]
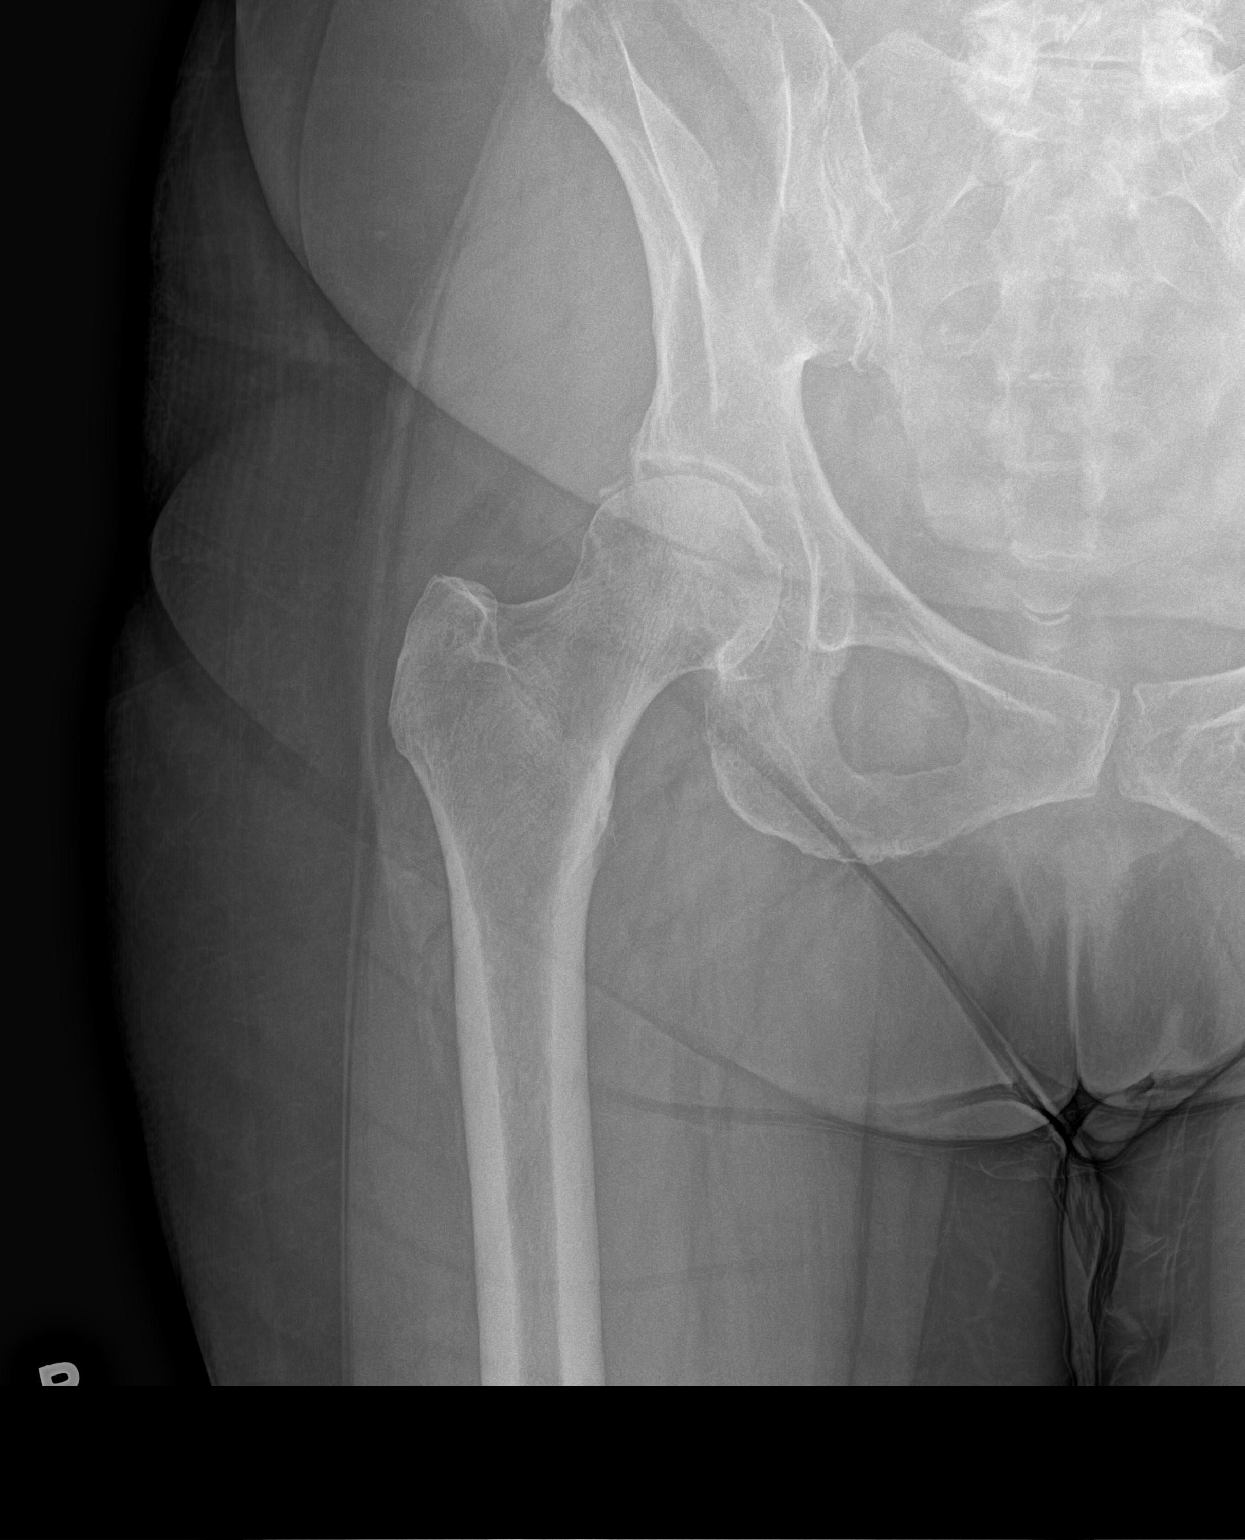

[hip frog leg]
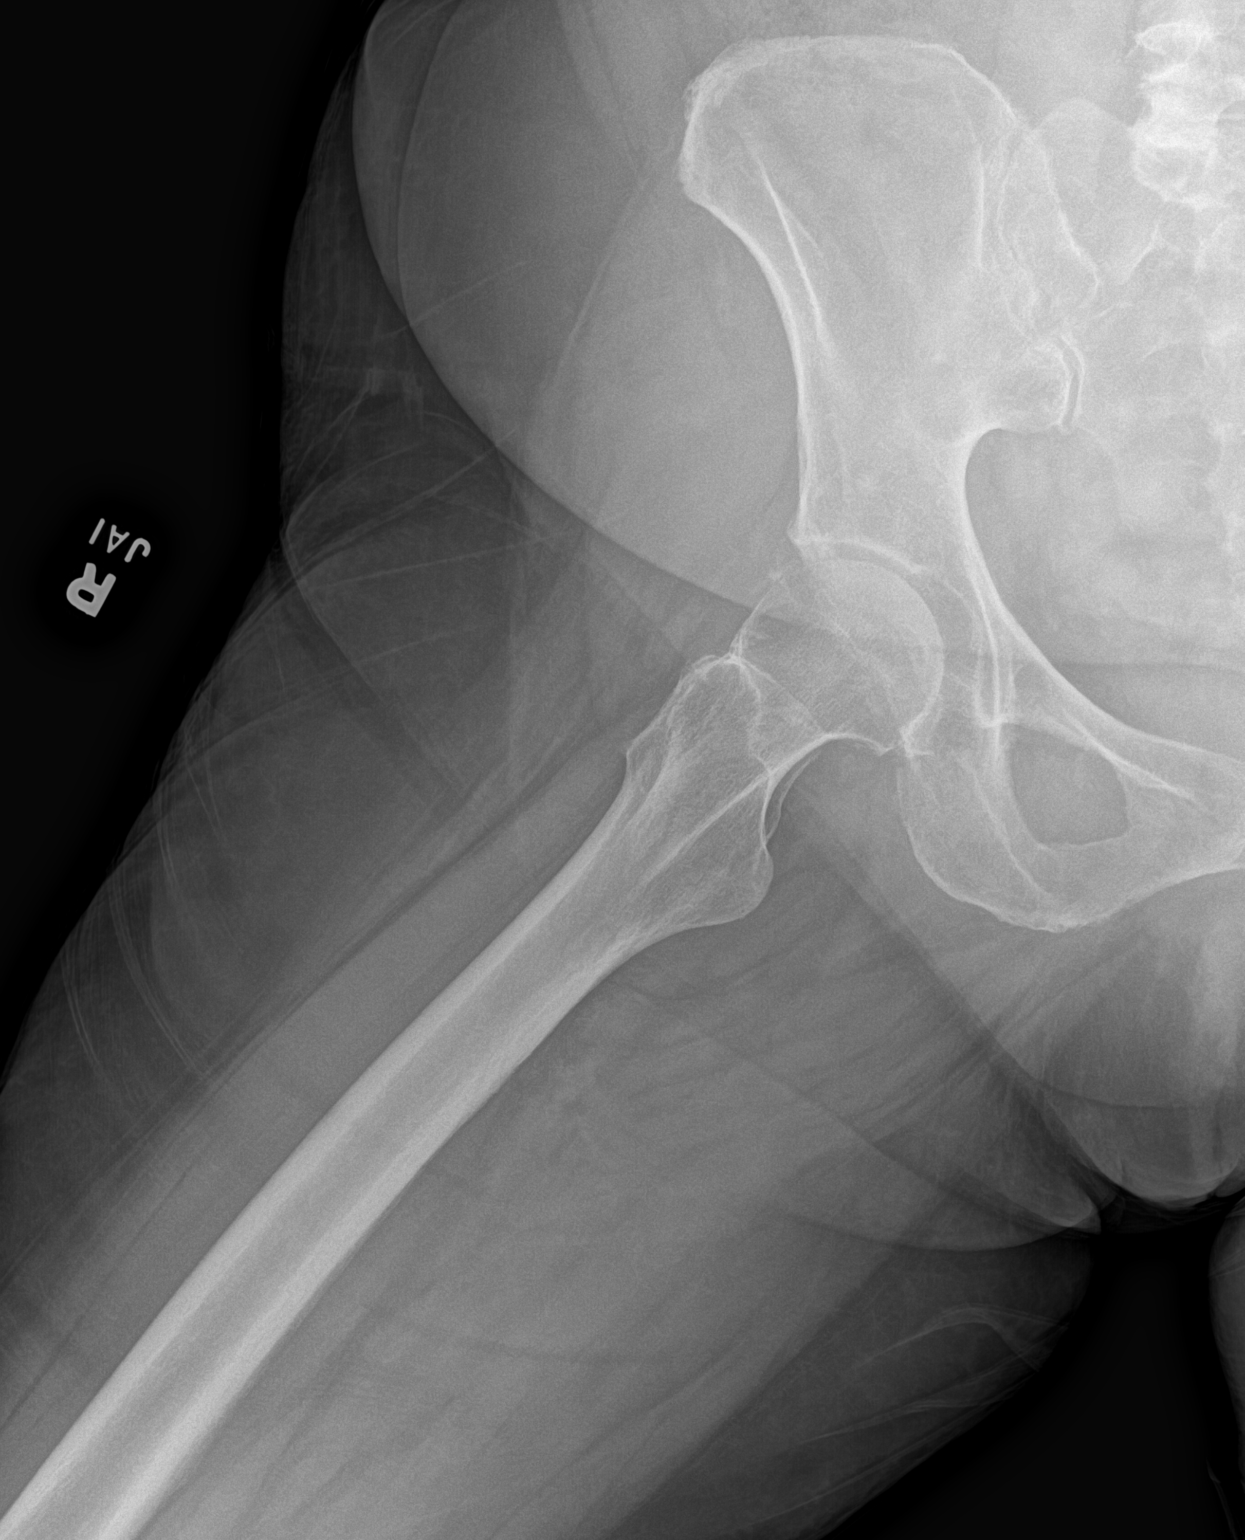

[3 of 3 positions shown; findings below may reference images not displayed]

FINDINGS: No fracture or bone lesion.

Mild axial hip joint space narrowing on the left. Right hip joint is
normally spaced and aligned.

SI joints and symphysis pubis are normally spaced and aligned.

Small focus of calcification or ossification just superior to the
right femoral head, lateral to the superior acetabulum. Soft tissues
otherwise unremarkable.
IMPRESSION: 1. No fracture or acute finding.
2. Mild left hip joint concentric narrowing. No other significant
hip joint arthropathic change.

## 2022-03-22 NOTE — Progress Notes (Signed)
Chief Complaint:   OBESITY Cassandra [name] is here to discuss her progress with her obesity treatment plan along with follow-up of her obesity related diagnoses. Cassandra [name] is on keeping a food journal and adhering to recommended goals of 1000-1100 calories and 75 grams of protein daily and states she is following her eating plan approximately 75% of the time. Cassandra [name] states she is walking for 15-30 minutes 5 times per week.  Today's visit was #: 73 Starting weight: 193 lbs Starting date: 11/13/2019 Today's weight: 195 lbs Today's date: 03/16/2022 Total lbs lost to date: 0 Total lbs lost since last in-office visit: 6  Interim History: Cassandra [name] continues to do well with weight loss. She is working on increasing but it is limited due to her knee pain.   Subjective:   1. Vertigo Cassandra [name] notes frequent episodes of vertigo. She also notes some problems with her hearing as well. She also has some tinnitus.   2. Pre-diabetes Cassandra [name] is on metformin and she is working on decreasing simple carbohydrates. She is trying to portion control and she is increasing her walking.   3. Other depression, with emotional eating  Cassandra [name] is stable on Lexapro, and she feels she is doing well.   Assessment/Plan:   1. Vertigo Cassandra [name] was referred to Ear, Nose, and Throat to evaluate for vertigo and hearing loss.   - Ambulatory referral to ENT  2. Pre-diabetes We will refill metformin for 90 days. Cassandra [name] will continue to work on weight loss, exercise, and decreasing simple carbohydrates to help decrease the risk of diabetes.   - metFORMIN (GLUCOPHAGE) 500 MG tablet; TAKE 1 TABLET BY MOUTH EVERY DAY WITH BREAKFAST  Dispense: 90 tablet; Refill: 1  3. Other depression, with emotional eating  Cassandra [name] will continue Lexapro 10 mg daily and we will refill for 90 days.   - escitalopram (LEXAPRO) 10 MG tablet; Take 1 tablet (10 mg total) by mouth daily with breakfast.  Dispense: 90 tablet; Refill: 1  4. Obesity with current BMI of  32.4 Cassandra [name] is currently in the action stage of change. As such, her goal is to continue with weight loss efforts. She has agreed to the Category 2 Plan.   Exercise goals: As is.   Behavioral modification strategies: increasing lean protein intake.  Cassandra [name] has agreed to follow-up with our clinic in 4 weeks. She was informed of the importance of frequent follow-up visits to maximize her success with intensive lifestyle modifications for her multiple health conditions.   Objective:   Blood pressure (!) 104/56, pulse 64, temperature 98.9 F (37.2 C), height '5\' 5"'$  (1.651 m), weight 195 lb (88.5 kg), SpO2 97 %. Body mass index is 32.45 kg/m.  General: Cooperative, alert, well developed, in no acute distress. HEENT: Conjunctivae and lids unremarkable. Cardiovascular: Regular rhythm.  Lungs: Normal work of breathing. Neurologic: No focal deficits.   Lab Results  Component Value Date   CREATININE 0.83 01/13/2022   BUN 12 01/13/2022   NA 140 01/13/2022   K 4.1 01/13/2022   CL 101 01/13/2022   CO2 23 01/13/2022   Lab Results  Component Value Date   ALT 7 01/13/2022   AST 20 01/13/2022   ALKPHOS 86 01/13/2022   BILITOT 0.4 01/13/2022   Lab Results  Component Value Date   HGBA1C 5.7 (H) 01/13/2022   HGBA1C 5.6 04/17/2020   HGBA1C 5.7 (H) 11/13/2019   HGBA1C 5.4 08/29/2019   HGBA1C 6.1 01/17/2019   Lab Results  Component Value Date  INSULIN 20.0 01/13/2022   INSULIN 24.4 07/21/2021   INSULIN 19.1 11/26/2020   INSULIN 15.9 04/17/2020   INSULIN 30.1 (H) 11/13/2019   Lab Results  Component Value Date   TSH 1.260 01/13/2022   Lab Results  Component Value Date   CHOL 161 01/13/2022   HDL 53 01/13/2022   LDLCALC 88 01/13/2022   TRIG 108 01/13/2022   CHOLHDL 3.0 11/26/2020   Lab Results  Component Value Date   VD25OH 55.1 01/13/2022   VD25OH 69.4 07/21/2021   VD25OH 45.1 11/26/2020   Lab Results  Component Value Date   WBC 7.5 07/21/2021   HGB 13.5 07/21/2021    HCT 41.4 07/21/2021   MCV 83 07/21/2021   PLT 290 07/21/2021   Lab Results  Component Value Date   IRON 56 07/21/2021   TIBC 449 07/21/2021   FERRITIN 11 (L) 07/21/2021   Attestation Statements:   Reviewed by clinician on day of visit: allergies, medications, problem list, medical history, surgical history, family history, social history, and previous encounter notes.   I, Trixie Dredge, am acting as transcriptionist for Dennard Nip, MD.  I have reviewed the above documentation for accuracy and completeness, and I agree with the above. -  Dennard Nip, MD

## 2022-04-05 DIAGNOSIS — R39198 Other difficulties with micturition: Secondary | ICD-10-CM | POA: Diagnosis not present

## 2022-04-05 DIAGNOSIS — N952 Postmenopausal atrophic vaginitis: Secondary | ICD-10-CM | POA: Diagnosis not present

## 2022-04-05 DIAGNOSIS — Z124 Encounter for screening for malignant neoplasm of cervix: Secondary | ICD-10-CM | POA: Diagnosis not present

## 2022-04-05 DIAGNOSIS — Z1272 Encounter for screening for malignant neoplasm of vagina: Secondary | ICD-10-CM | POA: Diagnosis not present

## 2022-04-05 DIAGNOSIS — Z6833 Body mass index (BMI) 33.0-33.9, adult: Secondary | ICD-10-CM | POA: Diagnosis not present

## 2022-04-06 ENCOUNTER — Encounter (INDEPENDENT_AMBULATORY_CARE_PROVIDER_SITE_OTHER): Payer: Self-pay | Admitting: Family Medicine

## 2022-04-06 ENCOUNTER — Ambulatory Visit (INDEPENDENT_AMBULATORY_CARE_PROVIDER_SITE_OTHER): Payer: PPO | Admitting: Family Medicine

## 2022-04-06 VITALS — BP 108/59 | HR 82 | Temp 98.5°F | Ht 65.0 in | Wt 198.0 lb

## 2022-04-06 DIAGNOSIS — Z6832 Body mass index (BMI) 32.0-32.9, adult: Secondary | ICD-10-CM | POA: Diagnosis not present

## 2022-04-06 DIAGNOSIS — E669 Obesity, unspecified: Secondary | ICD-10-CM | POA: Diagnosis not present

## 2022-04-06 DIAGNOSIS — R7303 Prediabetes: Secondary | ICD-10-CM | POA: Diagnosis not present

## 2022-04-06 DIAGNOSIS — F3289 Other specified depressive episodes: Secondary | ICD-10-CM | POA: Diagnosis not present

## 2022-04-06 MED ORDER — ESCITALOPRAM OXALATE 10 MG PO TABS: 10.0000 mg | ORAL_TABLET | Freq: Every day | ORAL | 0 refills | Status: DC

## 2022-04-06 MED ORDER — METFORMIN HCL 500 MG PO TABS: ORAL_TABLET | ORAL | 0 refills | Status: DC

## 2022-04-07 ENCOUNTER — Ambulatory Visit: Payer: PPO | Admitting: Family Medicine

## 2022-04-08 ENCOUNTER — Ambulatory Visit: Payer: PPO | Admitting: Family Medicine

## 2022-04-13 ENCOUNTER — Ambulatory Visit (INDEPENDENT_AMBULATORY_CARE_PROVIDER_SITE_OTHER): Payer: PPO | Admitting: Family Medicine

## 2022-04-14 NOTE — Progress Notes (Signed)
Chief Complaint:   OBESITY Cassandra [name] is here to discuss her progress with her obesity treatment plan along with follow-up of her obesity related diagnoses. Cassandra [name] is on the Category 2 Plan and states she is following her eating plan approximately 65% of the time. Cassandra [name] states she is walking for 20 minutes 5 times per week.  Today's visit was #: 28 Starting weight: 193 lbs Starting date: 11/13/2019 Today's weight: 198 lbs Today's date: 04/06/2022 Total lbs lost to date: 0 Total lbs lost since last in-office visit: 0  Interim History: Cassandra [name] has been on vacation and she is still retaining fluid. She will be traveling again soon. She is mindful of her eating, but she is unable to follow her eating plan closely.   Subjective:   1. Pre-diabetes Cassandra [name] is on metformin and she is working on decreasing simple carbohydrates. No side effects were noted.   2. Other depression, with emotional eating  Cassandra [name] is on Lexapro. Her mood is stable with no side effects noted. She is trying to decrease emotional eating behaviors, but she has done more celebration eating.   Assessment/Plan:   1. Pre-diabetes We will refill metformin for 1 month. Cassandra [name] will continue to work on weight loss, exercise, and decreasing simple carbohydrates to help decrease the risk of diabetes.   - metFORMIN (GLUCOPHAGE) 500 MG tablet; TAKE 1 TABLET BY MOUTH EVERY DAY WITH BREAKFAST  Dispense: 30 tablet; Refill: 0  2. Other depression, with emotional eating  Cassandra [name] will continue Lexapro 10 mg once daily, and we will refill for 1 month.   - escitalopram (LEXAPRO) 10 MG tablet; Take 1 tablet (10 mg total) by mouth daily with breakfast.  Dispense: 30 tablet; Refill: 0  3. Obesity, Current BMI 32.9 Cassandra [name] is currently in the action stage of change. As such, her goal is to maintain weight for now. She has agreed to the Category 2 Plan.   Her goal is to maintain her weight over Thanksgiving.   Exercise goals: As is.   Behavioral  modification strategies: increasing lean protein intake and holiday eating strategies .  Cassandra [name] has agreed to follow-up with our clinic in 4 to 5 weeks. She was informed of the importance of frequent follow-up visits to maximize her success with intensive lifestyle modifications for her multiple health conditions.   Objective:   Blood pressure (!) 108/59, pulse 82, temperature 98.5 F (36.9 C), height '5\' 5"'$  (1.651 m), weight 198 lb (89.8 kg), SpO2 97 %. Body mass index is 32.95 kg/m.  General: Cooperative, alert, well developed, in no acute distress. HEENT: Conjunctivae and lids unremarkable. Cardiovascular: Regular rhythm.  Lungs: Normal work of breathing. Neurologic: No focal deficits.   Lab Results  Component Value Date   CREATININE 0.83 01/13/2022   BUN 12 01/13/2022   NA 140 01/13/2022   K 4.1 01/13/2022   CL 101 01/13/2022   CO2 23 01/13/2022   Lab Results  Component Value Date   ALT 7 01/13/2022   AST 20 01/13/2022   ALKPHOS 86 01/13/2022   BILITOT 0.4 01/13/2022   Lab Results  Component Value Date   HGBA1C 5.7 (H) 01/13/2022   HGBA1C 5.6 04/17/2020   HGBA1C 5.7 (H) 11/13/2019   HGBA1C 5.4 08/29/2019   HGBA1C 6.1 01/17/2019   Lab Results  Component Value Date   INSULIN 20.0 01/13/2022   INSULIN 24.4 07/21/2021   INSULIN 19.1 11/26/2020   INSULIN 15.9 04/17/2020   INSULIN 30.1 (H) 11/13/2019   Lab Results  Component Value Date   TSH 1.260 01/13/2022   Lab Results  Component Value Date   CHOL 161 01/13/2022   HDL 53 01/13/2022   LDLCALC 88 01/13/2022   TRIG 108 01/13/2022   CHOLHDL 3.0 11/26/2020   Lab Results  Component Value Date   VD25OH 55.1 01/13/2022   VD25OH 69.4 07/21/2021   VD25OH 45.1 11/26/2020   Lab Results  Component Value Date   WBC 7.5 07/21/2021   HGB 13.5 07/21/2021   HCT 41.4 07/21/2021   MCV 83 07/21/2021   PLT 290 07/21/2021   Lab Results  Component Value Date   IRON 56 07/21/2021   TIBC 449 07/21/2021   FERRITIN  11 (L) 07/21/2021   Attestation Statements:   Reviewed by clinician on day of visit: allergies, medications, problem list, medical history, surgical history, family history, social history, and previous encounter notes.   I, Trixie Dredge, am acting as transcriptionist for Dennard Nip, MD.  I have reviewed the above documentation for accuracy and completeness, and I agree with the above. -  Dennard Nip, MD

## 2022-04-16 ENCOUNTER — Encounter: Payer: Self-pay | Admitting: Internal Medicine

## 2022-04-19 DIAGNOSIS — N890 Mild vaginal dysplasia: Secondary | ICD-10-CM | POA: Diagnosis not present

## 2022-04-19 DIAGNOSIS — R87612 Low grade squamous intraepithelial lesion on cytologic smear of cervix (LGSIL): Secondary | ICD-10-CM | POA: Diagnosis not present

## 2022-04-25 ENCOUNTER — Encounter: Payer: Self-pay | Admitting: Internal Medicine

## 2022-04-27 ENCOUNTER — Other Ambulatory Visit: Payer: Self-pay | Admitting: Family Medicine

## 2022-04-27 DIAGNOSIS — N210 Calculus in bladder: Secondary | ICD-10-CM | POA: Diagnosis not present

## 2022-04-27 DIAGNOSIS — R3915 Urgency of urination: Secondary | ICD-10-CM | POA: Diagnosis not present

## 2022-04-27 MED ORDER — GABAPENTIN 100 MG PO CAPS: 200.0000 mg | ORAL_CAPSULE | Freq: Every day | ORAL | 0 refills | Status: DC

## 2022-05-04 ENCOUNTER — Ambulatory Visit: Payer: PPO | Admitting: Family Medicine

## 2022-05-11 ENCOUNTER — Encounter (INDEPENDENT_AMBULATORY_CARE_PROVIDER_SITE_OTHER): Payer: Self-pay | Admitting: Family Medicine

## 2022-05-11 ENCOUNTER — Ambulatory Visit (INDEPENDENT_AMBULATORY_CARE_PROVIDER_SITE_OTHER): Payer: PPO | Admitting: Family Medicine

## 2022-05-11 VITALS — BP 114/65 | HR 56 | Temp 97.9°F | Ht 65.0 in | Wt 198.0 lb

## 2022-05-11 DIAGNOSIS — L659 Nonscarring hair loss, unspecified: Secondary | ICD-10-CM

## 2022-05-11 DIAGNOSIS — E559 Vitamin D deficiency, unspecified: Secondary | ICD-10-CM | POA: Diagnosis not present

## 2022-05-11 DIAGNOSIS — Z6833 Body mass index (BMI) 33.0-33.9, adult: Secondary | ICD-10-CM | POA: Diagnosis not present

## 2022-05-11 DIAGNOSIS — R7303 Prediabetes: Secondary | ICD-10-CM | POA: Diagnosis not present

## 2022-05-11 DIAGNOSIS — E669 Obesity, unspecified: Secondary | ICD-10-CM

## 2022-05-11 MED ORDER — METFORMIN HCL 500 MG PO TABS: ORAL_TABLET | ORAL | 1 refills | Status: DC

## 2022-05-11 MED ORDER — METFORMIN HCL 500 MG PO TABS: ORAL_TABLET | ORAL | 0 refills | Status: DC

## 2022-05-12 ENCOUNTER — Encounter (INDEPENDENT_AMBULATORY_CARE_PROVIDER_SITE_OTHER): Payer: Self-pay | Admitting: Family Medicine

## 2022-05-12 LAB — CBC WITH DIFFERENTIAL/PLATELET
Basophils Absolute: 0 10*3/uL (ref 0.0–0.2)
Basos: 0 %
EOS (ABSOLUTE): 0.2 10*3/uL (ref 0.0–0.4)
Eos: 3 %
Hematocrit: 39.5 % (ref 34.0–46.6)
Hemoglobin: 12.3 g/dL (ref 11.1–15.9)
Immature Grans (Abs): 0 10*3/uL (ref 0.0–0.1)
Immature Granulocytes: 0 %
Lymphocytes Absolute: 1.9 10*3/uL (ref 0.7–3.1)
Lymphs: 34 %
MCH: 25.3 pg — ABNORMAL LOW (ref 26.6–33.0)
MCHC: 31.1 g/dL — ABNORMAL LOW (ref 31.5–35.7)
MCV: 81 fL (ref 79–97)
Monocytes Absolute: 0.4 10*3/uL (ref 0.1–0.9)
Monocytes: 8 %
Neutrophils Absolute: 3.1 10*3/uL (ref 1.4–7.0)
Neutrophils: 55 %
Platelets: 282 10*3/uL (ref 150–450)
RBC: 4.87 x10E6/uL (ref 3.77–5.28)
RDW: 13.9 % (ref 11.7–15.4)
WBC: 5.6 10*3/uL (ref 3.4–10.8)

## 2022-05-12 LAB — CMP14+EGFR
ALT: 6 IU/L (ref 0–32)
AST: 22 IU/L (ref 0–40)
Albumin/Globulin Ratio: 1.7 (ref 1.2–2.2)
Albumin: 4.7 g/dL (ref 3.8–4.8)
Alkaline Phosphatase: 88 IU/L (ref 44–121)
BUN/Creatinine Ratio: 18 (ref 12–28)
BUN: 14 mg/dL (ref 8–27)
Bilirubin Total: 0.4 mg/dL (ref 0.0–1.2)
CO2: 25 mmol/L (ref 20–29)
Calcium: 9.9 mg/dL (ref 8.7–10.3)
Chloride: 101 mmol/L (ref 96–106)
Creatinine, Ser: 0.79 mg/dL (ref 0.57–1.00)
Globulin, Total: 2.7 g/dL (ref 1.5–4.5)
Glucose: 89 mg/dL (ref 70–99)
Potassium: 4.5 mmol/L (ref 3.5–5.2)
Sodium: 140 mmol/L (ref 134–144)
Total Protein: 7.4 g/dL (ref 6.0–8.5)
eGFR: 78 mL/min/{1.73_m2} (ref >59)

## 2022-05-12 LAB — VITAMIN B12: Vitamin B-12: 586 pg/mL (ref 232–1245)

## 2022-05-12 LAB — TSH: TSH: 1.26 u[IU]/mL (ref 0.450–4.500)

## 2022-05-12 LAB — HEMOGLOBIN A1C
Est. average glucose Bld gHb Est-mCnc: 123 mg/dL
Hgb A1c MFr Bld: 5.9 % — ABNORMAL HIGH (ref 4.8–5.6)

## 2022-05-12 LAB — INSULIN, RANDOM: INSULIN: 22.3 u[IU]/mL (ref 2.6–24.9)

## 2022-05-12 LAB — VITAMIN D 25 HYDROXY (VIT D DEFICIENCY, FRACTURES): Vit D, 25-Hydroxy: 45.8 ng/mL (ref 30.0–100.0)

## 2022-05-26 NOTE — Progress Notes (Signed)
Chief Complaint:   OBESITY Cassandra [name] is here to discuss her progress with her obesity treatment plan along with follow-up of her obesity related diagnoses. Cassandra [name] is on the Category 2 Plan and states she is following her eating plan approximately 0% of the time. Cassandra [name] states she is walking for 30 minutes 5 times per week.  Today's visit was #: 26 Starting weight: 193 lbs Starting date: 11/13/2019 Today's weight: 198 lbs Today's date: 05/11/2022 Total lbs lost to date: 0 Total lbs lost since last in-office visit: 0  Interim History: Cassandra [name] has done well with maintaining her weight. She is working on Network engineer. She is working regularly and notes increased stress, which may be negatively affecting her eating.   Subjective:   1. Pre-diabetes Cassandra [name] is due for labs, and she is working on her weight loss and decreasing simple carbohydrates to help her prevent diabetes mellitus.   2. Hair loss disorder Cassandra [name] notes diffused hair loss but with regrowth. 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   3. Vitamin D deficiency Cassandra [name] is stable on Vitamin D with no side effects noted. She is due for labs.   Assessment/Plan:   1. Pre-diabetes We will check labs today, and we will refill metformin for 2 month.   - CMP14+EGFR - Hemoglobin A1c - Insulin, random - metFORMIN (GLUCOPHAGE) 500 MG tablet; TAKE 1 TABLET BY MOUTH EVERY DAY WITH BREAKFAST  Dispense: 30 tablet; Refill: 1  2. Hair loss disorder We will check labs today to look for other causes of hair loss. We will follow-up at [facility] next visit.   - TSH - CBC with Differential/Platelet - Vitamin B12  3. Vitamin D deficiency We will check labs today, and we will follow-up at her next visit.   - VITAMIN D 25 Hydroxy (Vit-D Deficiency, Fractures)  4. Obesity, Current BMI 33.0 Cassandra [name] is currently in the action stage of change. As such, her goal is to continue with weight loss efforts. She has agreed to  practicing portion control and making smarter food choices, such as increasing vegetables and decreasing simple carbohydrates.   Exercise goals: As is.   Behavioral modification strategies: increasing lean protein intake and meal planning and cooking strategies.  Cassandra [name] has agreed to follow-up with our clinic in 5 to 6 weeks. She was informed of the importance of frequent follow-up visits to maximize her success with intensive lifestyle modifications for her multiple health conditions.   Cassandra [name] was informed we would discuss her lab results at her next visit unless there is a critical issue that needs to be addressed sooner. Cassandra [name] agreed to keep her next visit at the agreed upon time to discuss these results.  Objective:   Blood pressure 114/65, pulse (!) 56, temperature 97.9 F (36.6 C), height _0  (1.651 m), weight 198 lb (89.8 kg), SpO2 99 %. Body mass index is 32.95 kg/m.  General: Cooperative, alert, well developed, in no acute distress. HEENT: Conjunctivae and lids unremarkable. Cardiovascular: Regular rhythm.  Lungs: Normal work of breathing. Neurologic: No focal deficits.   Lab Results  Component Value Date   CREATININE 0.79 05/11/2022   BUN 14 05/11/2022   NA 140 05/11/2022   K 4.5 05/11/2022   CL 101 05/11/2022   CO2 25 05/11/2022   Lab Results  Component Value Date   ALT 6 05/11/2022   AST 22 05/11/2022   ALKPHOS 88 05/11/2022   BILITOT 0.4 05/11/2022   Lab Results  Component Value  Date   HGBA1C 5.9 (H) 05/11/2022   HGBA1C 5.7 (H) 01/13/2022   HGBA1C 5.6 04/17/2020   HGBA1C 5.7 (H) 11/13/2019   HGBA1C 5.4 08/29/2019   Lab Results  Component Value Date   INSULIN 22.3 05/11/2022   INSULIN 20.0 01/13/2022   INSULIN 24.4 07/21/2021   INSULIN 19.1 11/26/2020   INSULIN 15.9 04/17/2020   Lab Results  Component Value Date   TSH 1.260 05/11/2022   Lab Results  Component Value Date   CHOL 161 01/13/2022   HDL 53 01/13/2022   LDLCALC 88 01/13/2022   TRIG  108 01/13/2022   CHOLHDL 3.0 11/26/2020   Lab Results  Component Value Date   VD25OH 45.8 05/11/2022   VD25OH 55.1 01/13/2022   VD25OH 69.4 07/21/2021   Lab Results  Component Value Date   WBC 5.6 05/11/2022   HGB 12.3 05/11/2022   HCT 39.5 05/11/2022   MCV 81 05/11/2022   PLT 282 05/11/2022   Lab Results  Component Value Date   IRON 56 07/21/2021   TIBC 449 07/21/2021   FERRITIN 11 (L) 07/21/2021   Attestation Statements:   Reviewed by clinician on day of visit: allergies, medications, problem list, medical history, surgical history, family history, social history, and previous encounter notes.   I, Trixie Dredge, am acting as transcriptionist for Dennard Nip, MD.  I have reviewed the above documentation for accuracy and completeness, and I agree with the above. -  Dennard Nip, MD

## 2022-05-27 NOTE — Progress Notes (Addendum)
[name] [place] [place] Phone: [phone] Subjective:   Cassandra [name], am serving as a scribe for Dr. Hulan Saas.  I'm seeing this patient by the request  of:  Isaac Bliss, Rayford Halsted, MD  CC: Multiple complaints  DZH:[id number]  02/24/2022 Significant arthritis but doing well with the viscosupplementation.  We did discuss that it has been 6 months since we have done the viscosupplementation and can repeat if needed.  We discussed which activities to do and which ones to avoid.  Increase activity slowly otherwise.  Follow-up again in 6 to 8 weeks      Update 06/02/2022 Cassandra [name] is a 76 y.o. female coming in with complaint of L knee pain. Patient states that she is doing fairly well, so she has been pushing out her appointments. But she has noticed that her right arm has been tingling and this happens about every morning and it takes her long time of shaking her arms to try and get them to wake up. Her knee has been doing well.     Past Medical History:  Diagnosis Date   Anemia    Anxiety    Asthma    Back pain    BPPV (benign paroxysmal positional vertigo)    Chronic allergic conjunctivitis    Depression    Diverticulosis    Fibromyalgia    Gallbladder problem    Gallstones    GERD (gastroesophageal reflux disease)    Hiatal hernia    History of bladder stone    Iron (Fe) deficiency anemia    Joint pain    Lump in female breast    Mild persistent asthma    followed by pcp--- dr Alfonso Patten. sharma ([place] allergy/asthma)   OA (osteoarthritis)    knees, hands   OAB (overactive bladder)    OSA (obstructive sleep apnea)    per pt has not used cpap several years   Pancreatic cyst    PONV (postoperative nausea and vomiting)    RLS (restless legs syndrome)    Seasonal and perennial allergic rhinitis    SUI (stress urinary incontinence, female)    Swallowing difficulty    Vitamin B12 deficiency    Vitamin D  deficiency    Wears glasses    Past Surgical History:  Procedure Laterality Date   BLADDER SUSPENSION  1990's   sling   bladder tacking  2010   with mesh   BREAST LUMPECTOMY Left 1985   Benign cyst   BREAST REDUCTION SURGERY Bilateral 2003 approx.   [name] LESION EXCISION Right 2005  approx.   CYSTOSCOPY N/A 07/12/2016   Procedure: CYSTOSCOPY, VAGINOSCOPY, EXAM UNDER ANESTHESIA, STONE LITHOTRIPSY,;  Surgeon: Cleon Gustin, MD;  Location: [facility];  Service: Urology;  Laterality: N/A;   CYSTOSCOPY N/A 10/11/2016   Procedure: CYSTOSCOPY;  Surgeon: Cleon Gustin, MD;  [facility];  Service: Urology;  Laterality: N/A;   CYSTOSCOPY WITH LITHOLAPAXY N/A 10/11/2016   Procedure: CYSTOSCOPY WITH LITHOLAPAXY/ EXCISION OF MESH;  Surgeon: Cleon Gustin, MD;  Location: [facility];  Service: Urology;  Laterality: N/A;   CYSTOSCOPY WITH LITHOLAPAXY N/A 01/22/2019   Procedure: CYSTOSCOPY WITH LITHOLAPAXY;  Surgeon: Cleon Gustin, MD;  Location: [facility];  Service: Urology;  Laterality: N/A;  1 HR   HOLMIUM LASER APPLICATION  1/96/2229   Procedure: HOLMIUM LASER APPLICATION;  Surgeon: Cleon Gustin, MD;  Location: [facility];  Service: Urology;;   HOLMIUM LASER APPLICATION N/A 02/01/91   Procedure: HOLMIUM LASER APPLICATION;  Surgeon: Cleon Gustin, MD;  Location: [facility];  Service: Urology;  Laterality: N/A;   RIGHT KNEE ARTHROSCOPY  2013   TOTAL KNEE ARTHROPLASTY Right 09/12/2012   Procedure: RIGHT TOTAL KNEE ARTHROPLASTY;  Surgeon: Gearlean Alf, MD;  Location: [facility];  Service: Orthopedics;  Laterality: Right;   VAGINAL HYSTERECTOMY  1984   Social History   Socioeconomic History   Marital status: Widowed    Spouse name: Not on file   Number of children: 2   Years of education: Not on file   Highest education level: Bachelor's degree (e.g., BA, AB, BS)   Occupational History   Occupation: Biochemist, clinical Retired  Tobacco Use   Smoking status: Never   Smokeless tobacco: Never  Vaping Use   Vaping Use: Never used  Substance and Sexual Activity   Alcohol use: Yes    Comment: 1x monthly   Drug use: Never   Sexual activity: Not on file  Other Topics Concern   Not on file  Social History Narrative   Lives alone   R handed   Caffeine: 1 C a day   Social Determinants of Health   Financial Resource Strain: Not on file  Food Insecurity: Not on file  Transportation Needs: Not on file  Physical Activity: Not on file  Stress: Not on file  Social Connections: Not on file   Allergies  Allergen Reactions   Shellfish Allergy Hives   Cephalosporins Hives   Codeine     Other reaction(s): Unknown   Hydrochlorothiazide Hives   Misc. Sulfonamide Containing Compounds Hives   Penicillins Hives   Sulfa Antibiotics Hives   Sulfacetamide Sodium     Other reaction(s): Unknown   Family History  Problem Relation Age of Onset   Kidney cancer Mother    Diabetes Mother    Obesity Mother    Dementia Mother        died age 11   Other Father        burn victum   Alcoholism Father    Colon cancer Neg Hx    Inflammatory bowel disease Neg Hx    Liver disease Neg Hx    Pancreatic cancer Neg Hx    Stomach cancer Neg Hx    Rectal cancer Neg Hx     Current Outpatient Medications (Endocrine & Metabolic):    metFORMIN (GLUCOPHAGE) 500 MG tablet, TAKE 1 TABLET BY MOUTH EVERY DAY WITH BREAKFAST  Current Outpatient Medications (Cardiovascular):    EPINEPHrine 0.3 mg/0.3 mL IJ SOAJ injection, Inject 0.3 mg into the muscle as needed for anaphylaxis.   spironolactone (ALDACTONE) 25 MG tablet, TAKE 1 TABLET (25 MG TOTAL) BY MOUTH DAILY.  Current Outpatient Medications (Respiratory):    fluticasone (FLONASE) 50 MCG/ACT nasal spray, Place into both nostrils.   levocetirizine (XYZAL) 5 MG tablet, Take 5 mg by mouth every evening.      Current Outpatient Medications (Other):    escitalopram (LEXAPRO) 10 MG tablet, Take 1 tablet (10 mg total) by mouth daily with breakfast.   gabapentin (NEURONTIN) 100 MG capsule, Take 2 capsules (200 mg total) by mouth at bedtime.   Misc Natural Products (TART CHERRY ADVANCED) CAPS, Take 1 capsule by mouth. '1200mg'$  capsule   omeprazole (PRILOSEC) 20 MG capsule, Take 20 mg by mouth 2 (two) times daily before a meal.   pramipexole (MIRAPEX) 0.25 MG tablet,  Prn at night for RLS   psyllium (REGULOID) 0.52 g capsule, Take 0.52 g by mouth daily.   traZODone (DESYREL) 50 MG tablet, Take 50 mg by mouth at bedtime.   Reviewed prior external information including notes and imaging from  primary care provider As well as notes that were available from care everywhere and other healthcare systems.  Past medical history, social, surgical and family history all reviewed in electronic medical record.  No pertanent information unless stated regarding to the chief complaint.   Review of Systems:  No headache, visual changes, nausea, vomiting, diarrhea, constipation, dizziness, abdominal pain, skin rash, fevers, chills, night sweats, weight loss, swollen lymph nodes, body aches, joint swelling, chest pain, shortness of breath, mood changes. POSITIVE muscle aches  Objective  Blood pressure 117/70, pulse 70, height '5\' 5"'$  (1.651 m), SpO2 98 %.   General: No apparent distress alert and oriented x3 mood and affect normal, dressed appropriately.  HEENT: Pupils equal, extraocular movements intact  Respiratory: Patient's speak in full sentences and does not appear short of breath  Cardiovascular: No lower extremity edema, non tender, no erythema  Neck exam has good range of motion.  Negative Spurling's.  Tender to palpation over the acromioclavicular joint.  Positive crossover noted.  Rotator cuff strength is intact.  Left knee does have the arthritic changes noted and does have some crepitus noted.   Patient does have some instability with valgus and varus force but very minimal pain.  Right hand exam shows the patient does have a trigger nodule noted.  This is at the A2 pulley of the ring finger.  After verbal consent patient was prepped with alcohol swabs and with a 25-gauge half inch needle injected with 0.5 cc of 0.5% Marcaine and 0.5 cc of Kenalog 40 mg/mL into the [facility] joint..  No blood loss.  Band-Aid placed.  Postinjection instructions given  [id number]; 15 additional minutes spent for Therapeutic exercises as stated in above notes.  This included exercises focusing on stretching, strengthening, with significant focus on eccentric aspects.   Long term goals include an improvement in range of motion, strength, endurance as well as avoiding reinjury. Patient's frequency would include in 1-2 times a day, 3-5 times a week for a duration of 6-12 weeks.  Shoulder Exercises that included:  Basic scapular stabilization to include adduction and depression of scapula Scaption, focusing on proper movement and good control Internal and External rotation utilizing a theraband, with elbow tucked at side entire time Rows with theraband  Proper technique shown and discussed handout in great detail with ATC.  All questions were discussed and answered.       Impression and Recommendations:    The above documentation has been reviewed and is accurate and complete Lyndal Pulley, DO

## 2022-06-02 ENCOUNTER — Ambulatory Visit: Payer: PPO | Admitting: Family Medicine

## 2022-06-02 VITALS — BP 117/70 | HR 70 | Ht 65.0 in

## 2022-06-02 DIAGNOSIS — M19011 Primary osteoarthritis, right shoulder: Secondary | ICD-10-CM

## 2022-06-02 DIAGNOSIS — M65342 Trigger finger, left ring finger: Secondary | ICD-10-CM

## 2022-06-02 DIAGNOSIS — M65321 Trigger finger, right index finger: Secondary | ICD-10-CM | POA: Insufficient documentation

## 2022-06-02 DIAGNOSIS — M1712 Unilateral primary osteoarthritis, left knee: Secondary | ICD-10-CM | POA: Diagnosis not present

## 2022-06-02 DIAGNOSIS — M19019 Primary osteoarthritis, unspecified shoulder: Secondary | ICD-10-CM | POA: Insufficient documentation

## 2022-06-02 NOTE — Assessment & Plan Note (Signed)
Patient wants to hold off on any other injection we will continue to monitor.  Follow-up again in 2 to 3 months

## 2022-06-02 NOTE — Patient Instructions (Addendum)
Injection given  Compression sleeve on elbow with leaf blowing Increase gabapentin to '300mg'$  to see if that helps  Exercises given  Follow up in 6 weeks

## 2022-06-02 NOTE — Assessment & Plan Note (Signed)
Bracing at night for now, with worsening pain consider injection

## 2022-06-02 NOTE — Assessment & Plan Note (Signed)
New problem, discussed with patient that cervical radiculopathy, subacromial bursitis are within the discussed icing regimen and home exercises, discussed which activities to do and which ones to avoid.  Increase activity slowly.  Follow-up again with me in 6 to 8 weeks.  Worsening pain get x-rays but I think patient will do relatively well.

## 2022-06-10 ENCOUNTER — Other Ambulatory Visit: Payer: Self-pay | Admitting: Internal Medicine

## 2022-06-10 ENCOUNTER — Other Ambulatory Visit: Payer: Self-pay | Admitting: Neurology

## 2022-06-10 DIAGNOSIS — R42 Dizziness and giddiness: Secondary | ICD-10-CM

## 2022-06-22 ENCOUNTER — Ambulatory Visit (INDEPENDENT_AMBULATORY_CARE_PROVIDER_SITE_OTHER): Payer: PPO | Admitting: Family Medicine

## 2022-06-22 ENCOUNTER — Encounter (INDEPENDENT_AMBULATORY_CARE_PROVIDER_SITE_OTHER): Payer: Self-pay | Admitting: Family Medicine

## 2022-06-22 VITALS — BP 121/64 | HR 87 | Temp 98.1°F | Ht 65.0 in | Wt 200.0 lb

## 2022-06-22 DIAGNOSIS — Z6833 Body mass index (BMI) 33.0-33.9, adult: Secondary | ICD-10-CM | POA: Diagnosis not present

## 2022-06-22 DIAGNOSIS — E559 Vitamin D deficiency, unspecified: Secondary | ICD-10-CM | POA: Diagnosis not present

## 2022-06-22 DIAGNOSIS — Z6832 Body mass index (BMI) 32.0-32.9, adult: Secondary | ICD-10-CM | POA: Insufficient documentation

## 2022-06-22 DIAGNOSIS — Z6831 Body mass index (BMI) 31.0-31.9, adult: Secondary | ICD-10-CM | POA: Insufficient documentation

## 2022-06-22 DIAGNOSIS — R7303 Prediabetes: Secondary | ICD-10-CM

## 2022-06-22 DIAGNOSIS — E669 Obesity, unspecified: Secondary | ICD-10-CM | POA: Diagnosis not present

## 2022-06-30 DIAGNOSIS — H53143 Visual discomfort, bilateral: Secondary | ICD-10-CM | POA: Diagnosis not present

## 2022-06-30 DIAGNOSIS — E119 Type 2 diabetes mellitus without complications: Secondary | ICD-10-CM | POA: Diagnosis not present

## 2022-06-30 DIAGNOSIS — H52223 Regular astigmatism, bilateral: Secondary | ICD-10-CM | POA: Diagnosis not present

## 2022-06-30 DIAGNOSIS — H524 Presbyopia: Secondary | ICD-10-CM | POA: Diagnosis not present

## 2022-06-30 DIAGNOSIS — H5203 Hypermetropia, bilateral: Secondary | ICD-10-CM | POA: Diagnosis not present

## 2022-06-30 DIAGNOSIS — H25013 Cortical age-related cataract, bilateral: Secondary | ICD-10-CM | POA: Diagnosis not present

## 2022-07-02 DIAGNOSIS — R3915 Urgency of urination: Secondary | ICD-10-CM | POA: Diagnosis not present

## 2022-07-06 DIAGNOSIS — N210 Calculus in bladder: Secondary | ICD-10-CM | POA: Diagnosis not present

## 2022-07-06 DIAGNOSIS — R3915 Urgency of urination: Secondary | ICD-10-CM | POA: Diagnosis not present

## 2022-07-06 DIAGNOSIS — R3914 Feeling of incomplete bladder emptying: Secondary | ICD-10-CM | POA: Diagnosis not present

## 2022-07-07 NOTE — Progress Notes (Signed)
Chief Complaint:   OBESITY Cassandra [name] is here to discuss her progress with her obesity treatment plan along with follow-up of her obesity related diagnoses. Cassandra [name] is on practicing portion control and making smarter food choices, such as increasing vegetables and decreasing simple carbohydrates and states she is following her eating plan approximately 50% of the time. Cassandra [name] states she is doing chair exercise 3-4 times per week.    Today's visit was #: 33 Starting weight: 193 lbs Starting date: 11/13/2019 Today's weight: 200 lbs Today's date: 06/22/2022 Total lbs lost to date: 0 Total lbs lost since last in-office visit: 0  Interim History: Cassandra [name] continues to work on portion control and Psychologist, counselling. She notes her protein has decreased and she has had more snaking. She wants to work on doing better with her weight loss efforts.   Subjective:   1. Vitamin D deficiency Cassandra [name] is not on Vitamin D, and she had been close to being over-replaced previously. Her level is decreased and she is at risk of deficiency again. I discussed labs with the patient today.   2. Pre-diabetes Cassandra [name] is on metformin, but her A1c is worsening and has increased recently to 5.9. I discussed labs with the patient today.   Assessment/Plan:   1. Vitamin D deficiency Cassandra [name] agreed to start Vitamin D OTC 2,000 IU daily.   2. Pre-diabetes Cassandra [name] is to increase metformin to BID, no refill needed.   3. BMI 33.0-33.9,adult  4. Obesity, Beginning BMI 32.12 Cassandra [name] is currently in the action stage of change. As such, her goal is to continue with weight loss efforts. She has agreed to practicing portion control and making smarter food choices, such as increasing vegetables and decreasing simple carbohydrates.   Cassandra [name] was encouraged to work on meal planning to help her make better choices.   Exercise goals: As is.  Behavioral modification strategies: increasing lean protein intake, decreasing simple  carbohydrates, and better snacking choices.  Cassandra [name] has agreed to follow-up with our clinic in 4 weeks. She was informed of the importance of frequent follow-up visits to maximize her success with intensive lifestyle modifications for her multiple health conditions.   Objective:   Blood pressure 121/64, pulse 87, temperature 98.1 F (36.7 C), height '5\' 5"'$  (1.651 m), weight 200 lb (90.7 kg), SpO2 97 %. Body mass index is 33.28 kg/m.  General: Cooperative, alert, well developed, in no acute distress. HEENT: Conjunctivae and lids unremarkable. Cardiovascular: Regular rhythm.  Lungs: Normal work of breathing. Neurologic: No focal deficits.   Lab Results  Component Value Date   CREATININE 0.79 05/11/2022   BUN 14 05/11/2022   NA 140 05/11/2022   K 4.5 05/11/2022   CL 101 05/11/2022   CO2 25 05/11/2022   Lab Results  Component Value Date   ALT 6 05/11/2022   AST 22 05/11/2022   ALKPHOS 88 05/11/2022   BILITOT 0.4 05/11/2022   Lab Results  Component Value Date   HGBA1C 5.9 (H) 05/11/2022   HGBA1C 5.7 (H) 01/13/2022   HGBA1C 5.6 04/17/2020   HGBA1C 5.7 (H) 11/13/2019   HGBA1C 5.4 08/29/2019   Lab Results  Component Value Date   INSULIN 22.3 05/11/2022   INSULIN 20.0 01/13/2022   INSULIN 24.4 07/21/2021   INSULIN 19.1 11/26/2020   INSULIN 15.9 04/17/2020   Lab Results  Component Value Date   TSH 1.260 05/11/2022   Lab Results  Component Value Date   CHOL 161 01/13/2022   HDL 53 01/13/2022  LDLCALC 88 01/13/2022   TRIG 108 01/13/2022   CHOLHDL 3.0 11/26/2020   Lab Results  Component Value Date   VD25OH 45.8 05/11/2022   VD25OH 55.1 01/13/2022   VD25OH 69.4 07/21/2021   Lab Results  Component Value Date   WBC 5.6 05/11/2022   HGB 12.3 05/11/2022   HCT 39.5 05/11/2022   MCV 81 05/11/2022   PLT 282 05/11/2022   Lab Results  Component Value Date   IRON 56 07/21/2021   TIBC 449 07/21/2021   FERRITIN 11 (L) 07/21/2021   Attestation Statements:    Reviewed by clinician on day of visit: allergies, medications, problem list, medical history, surgical history, family history, social history, and previous encounter notes.   I, Trixie Dredge, am acting as transcriptionist for Dennard Nip, MD.  I have reviewed the above documentation for accuracy and completeness, and I agree with the above. -  Dennard Nip, MD

## 2022-07-12 NOTE — Progress Notes (Unsigned)
[facility] Phone: [phone] Subjective:   [name], am serving as a scribe for Dr. Hulan Saas.  I'm seeing this patient by the request  of:  Isaac Bliss, Rayford Halsted, MD  CC: Multiple joint complaints  [id number]  06/02/2022 Patient wants to hold off on any other injection we will continue to monitor. Follow-up again in 2 to 3 month   Bracing at night for now, with worsening pain consider injection   New problem, discussed with patient that cervical radiculopathy, subacromial bursitis are within the discussed icing regimen and home exercises, discussed which activities to do and which ones to avoid.  Increase activity slowly.  Follow-up again with me in 6 to 8 weeks.  Worsening pain get x-rays but I think patient will do relatively well     Updated 07/13/2022 MELEANE DEOLIVEIRA is a 77 y.o. female coming in with complaint of R shoulder, L knee, and L finger pain. Patient states that she is having a catching sensation in the lateral aspect of knee when going down the steps and with hamstring stretching.   Injection in shoulder helped for one week. Pain radiating into elbow and wrist. Tingling the arm to the hand. Patient having R sided burning in the chest. Notes having decompressed gall bladder.   Has been using 3-160m gabapentin. Would like new Rx.      Past Medical History:  Diagnosis Date   Anemia    Anxiety    Asthma    Back pain    BPPV (benign paroxysmal positional vertigo)    Chronic allergic conjunctivitis    Depression    Diverticulosis    Fibromyalgia    Gallbladder problem    Gallstones    GERD (gastroesophageal reflux disease)    Hiatal hernia    History of bladder stone    Iron (Fe) deficiency anemia    Joint pain    Lump in female breast    Mild persistent asthma    followed by pcp--- dr rAlfonso Patten sharma ([place] allergy/asthma)   OA (osteoarthritis)    knees, hands   OAB  (overactive bladder)    OSA (obstructive sleep apnea)    per pt has not used cpap several years   Pancreatic cyst    PONV (postoperative nausea and vomiting)    RLS (restless legs syndrome)    Seasonal and perennial allergic rhinitis    SUI (stress urinary incontinence, female)    Swallowing difficulty    Vitamin B12 deficiency    Vitamin D deficiency    Wears glasses    Past Surgical History:  Procedure Laterality Date   BLADDER SUSPENSION  1990's   sling   bladder tacking  2010   with mesh   BREAST LUMPECTOMY Left 1985   Benign cyst   BREAST REDUCTION SURGERY Bilateral 2003 approx.   CORNEA LESION EXCISION Right 2005  approx.   CYSTOSCOPY N/A 07/12/2016   Procedure: CYSTOSCOPY, VAGINOSCOPY, EXAM UNDER ANESTHESIA, STONE LITHOTRIPSY,;  Surgeon: PCleon Gustin MD;  [facility]  Service: Urology;  Laterality: N/A;   CYSTOSCOPY N/A 10/11/2016   Procedure: CYSTOSCOPY;  Surgeon: MCleon Gustin MD;  [facility]  Service: Urology;  Laterality: N/A;   CYSTOSCOPY WITH LITHOLAPAXY N/A 10/11/2016   Procedure: CYSTOSCOPY WITH LITHOLAPAXY/ EXCISION OF MESH;  Surgeon: MCleon Gustin MD;  [facility]  Service: Urology;  Laterality: N/A;  CYSTOSCOPY WITH LITHOLAPAXY N/A 01/22/2019   Procedure: CYSTOSCOPY WITH LITHOLAPAXY;  Surgeon: Cleon Gustin, MD;  Location: [facility];  Service: Urology;  Laterality: N/A;  1 HR   HOLMIUM LASER APPLICATION  [id number]   Procedure: HOLMIUM LASER APPLICATION;  Surgeon: Cleon Gustin, MD;  Location[facility];  Service: Urology;;   HOLMIUM LASER APPLICATION N/A 0000000   Procedure: HOLMIUM LASER APPLICATION;  Surgeon: Cleon Gustin, MD;  Location: [facility];  Service: Urology;  Laterality: N/A;   RIGHT KNEE ARTHROSCOPY  2013   TOTAL KNEE ARTHROPLASTY Right 09/12/2012   Procedure: RIGHT TOTAL KNEE  ARTHROPLASTY;  Surgeon: Gearlean Alf, MD;  Location: [facility];  Service: Orthopedics;  Laterality: Right;   VAGINAL HYSTERECTOMY  1984   Social History   Socioeconomic History   Marital status: Widowed    Spouse name: Not on file   Number of children: 2   Years of education: Not on file   Highest education level: Bachelor's degree (e.g., BA, AB, BS)  Occupational History   Occupation: Biochemist, clinical Retired  Tobacco Use   Smoking status: Never   Smokeless tobacco: Never  Vaping Use   Vaping Use: Never used  Substance and Sexual Activity   Alcohol use: Yes    Comment: 1x monthly   Drug use: Never   Sexual activity: Not on file  Other Topics Concern   Not on file  Social History Narrative   Lives alone   R handed   Caffeine: 1 C a day   Social Determinants of Health   Financial Resource Strain: Not on file  Food Insecurity: Not on file  Transportation Needs: Not on file  Physical Activity: Not on file  Stress: Not on file  Social Connections: Not on file   Allergies  Allergen Reactions   Shellfish Allergy Hives   Cephalosporins Hives   Codeine     Other reaction(s): Unknown   Hydrochlorothiazide Hives   Misc. Sulfonamide Containing Compounds Hives   Penicillins Hives   Sulfa Antibiotics Hives   Sulfacetamide Sodium     Other reaction(s): Unknown   Family History  Problem Relation Age of Onset   Kidney cancer Mother    Diabetes Mother    Obesity Mother    Dementia Mother        died age 53   Other Father        burn victum   Alcoholism Father    Colon cancer Neg Hx    Inflammatory bowel disease Neg Hx    Liver disease Neg Hx    Pancreatic cancer Neg Hx    Stomach cancer Neg Hx    Rectal cancer Neg Hx     Current Outpatient Medications (Endocrine & Metabolic):    metFORMIN (GLUCOPHAGE) 500 MG tablet, TAKE 1 TABLET BY MOUTH EVERY DAY WITH BREAKFAST  Current Outpatient Medications (Cardiovascular):    EPINEPHrine 0.3 mg/0.3 mL IJ SOAJ  injection, Inject 0.3 mg into the muscle as needed for anaphylaxis.   spironolactone (ALDACTONE) 25 MG tablet, TAKE 1 TABLET (25 MG TOTAL) BY MOUTH DAILY.  Current Outpatient Medications (Respiratory):    fluticasone (FLONASE) 50 MCG/ACT nasal spray, Place into both nostrils.   levocetirizine (XYZAL) 5 MG tablet, Take 5 mg by mouth every evening.     Current Outpatient Medications (Other):    escitalopram (LEXAPRO) 10 MG tablet, Take 1 tablet (10 mg total) by mouth daily with breakfast.   gabapentin (NEURONTIN) 100  MG capsule, Take 2 capsules (200 mg total) by mouth at bedtime.   Misc Natural Products (TART CHERRY ADVANCED) CAPS, Take 1 capsule by mouth. 1241m capsule   omeprazole (PRILOSEC) 20 MG capsule, Take 20 mg by mouth 2 (two) times daily before a meal.   pramipexole (MIRAPEX) 0.25 MG tablet, Prn at night for RLS   psyllium (REGULOID) 0.52 g capsule, Take 0.52 g by mouth daily.   traZODone (DESYREL) 50 MG tablet, TAKE 1 TABLET BY MOUTH AT BEDTIME AS NEEDED FOR SLEEP.   Reviewed prior external information including notes and imaging from  primary care provider As well as notes that were available from care everywhere and other healthcare systems.  Past medical history, social, surgical and family history all reviewed in electronic medical record.  No pertanent information unless stated regarding to the chief complaint.   Review of Systems:  No headache, visual changes, nausea, vomiting, diarrhea, constipation, dizziness, , skin rash, fevers, chills, night sweats, weight loss, swollen lymph nodes, body aches, joint swelling, chest pain, shortness of breath, mood changes. POSITIVE muscle aches, abdominal pain  Objective  Blood pressure 138/78, pulse 77, height 5' 5"$  (1.651 m), weight 201 lb (91.2 kg), SpO2 97 %.   General: No apparent distress alert and oriented x3 mood and affect normal, dressed appropriately.  HEENT: Pupils equal, extraocular movements intact  Respiratory:  Patient's speak in full sentences and does not appear short of breath  Cardiovascular: No lower extremity edema, non tender, no erythema  Low back does have some loss lordosis noted.  Tightness with FABER test bilaterally Instability of the left knee noted.  This is with valgus and varus force.  Abnormal thigh to calf ratio noted. TTP in the RUQ  After informed written and verbal consent, patient was seated on exam table. Left knee was prepped with alcohol swab and utilizing anterolateral approach, patient's left knee space was injected with 4:1  marcaine 0.5%: Kenalog 428mdL. Patient tolerated the procedure well without immediate complications.   Impression and Recommendations:     The above documentation has been reviewed and is accurate and complete ZaLyndal PulleyDO

## 2022-07-13 ENCOUNTER — Ambulatory Visit (INDEPENDENT_AMBULATORY_CARE_PROVIDER_SITE_OTHER): Payer: PPO

## 2022-07-13 ENCOUNTER — Encounter: Payer: Self-pay | Admitting: Family Medicine

## 2022-07-13 ENCOUNTER — Ambulatory Visit: Payer: Self-pay

## 2022-07-13 ENCOUNTER — Ambulatory Visit: Payer: PPO | Admitting: Family Medicine

## 2022-07-13 VITALS — BP 138/78 | HR 77 | Ht 65.0 in | Wt 201.0 lb

## 2022-07-13 DIAGNOSIS — K8020 Calculus of gallbladder without cholecystitis without obstruction: Secondary | ICD-10-CM

## 2022-07-13 DIAGNOSIS — M542 Cervicalgia: Secondary | ICD-10-CM

## 2022-07-13 DIAGNOSIS — M25511 Pain in right shoulder: Secondary | ICD-10-CM

## 2022-07-13 DIAGNOSIS — M1712 Unilateral primary osteoarthritis, left knee: Secondary | ICD-10-CM | POA: Diagnosis not present

## 2022-07-13 NOTE — Patient Instructions (Addendum)
Knee injection Xray today HIDA scan -I will talk to your other doc See me again in 2 months

## 2022-07-13 NOTE — Assessment & Plan Note (Signed)
Patient given injection and tolerated the procedure well, discussed icing regimen and home exercises, discussed which activities to do and which ones to avoid.  We discussed that patient did respond well to viscosupplementation nearly a year ago.  Continue to wear the bracing.  Increase activity as tolerated.  Follow-up again in 6 to 8 weeks

## 2022-07-14 ENCOUNTER — Ambulatory Visit: Payer: PPO | Admitting: Family Medicine

## 2022-07-14 NOTE — Assessment & Plan Note (Signed)
Patient continues to have tenderness in the right upper quadrant.  Discussed with patient that I do feel at this point a HIDA scan could be beneficial.  Just to make sure there is nothing else that is potentially contributing to some of his discomfort of this and that could be causing or associated with some of her back pain.  Discussed icing regimen and home exercises otherwise.  Encouraged her to follow-up with gastroenterology as well if this continues and if significant worsening pain to seek medical attention immediately.

## 2022-07-15 ENCOUNTER — Ambulatory Visit: Payer: PPO | Admitting: Family Medicine

## 2022-07-19 ENCOUNTER — Other Ambulatory Visit: Payer: Self-pay | Admitting: Urology

## 2022-07-19 ENCOUNTER — Other Ambulatory Visit: Payer: Self-pay | Admitting: Family Medicine

## 2022-07-27 ENCOUNTER — Ambulatory Visit (INDEPENDENT_AMBULATORY_CARE_PROVIDER_SITE_OTHER): Payer: PPO | Admitting: Family Medicine

## 2022-07-27 ENCOUNTER — Other Ambulatory Visit (INDEPENDENT_AMBULATORY_CARE_PROVIDER_SITE_OTHER): Payer: Self-pay | Admitting: Family Medicine

## 2022-07-27 ENCOUNTER — Encounter (INDEPENDENT_AMBULATORY_CARE_PROVIDER_SITE_OTHER): Payer: Self-pay | Admitting: Family Medicine

## 2022-07-27 VITALS — BP 118/74 | HR 74 | Temp 98.5°F | Ht 65.0 in | Wt 201.0 lb

## 2022-07-27 DIAGNOSIS — Z6832 Body mass index (BMI) 32.0-32.9, adult: Secondary | ICD-10-CM | POA: Diagnosis not present

## 2022-07-27 DIAGNOSIS — Z6833 Body mass index (BMI) 33.0-33.9, adult: Secondary | ICD-10-CM

## 2022-07-27 DIAGNOSIS — G4709 Other insomnia: Secondary | ICD-10-CM | POA: Diagnosis not present

## 2022-07-27 DIAGNOSIS — R7303 Prediabetes: Secondary | ICD-10-CM | POA: Diagnosis not present

## 2022-07-27 DIAGNOSIS — E669 Obesity, unspecified: Secondary | ICD-10-CM | POA: Diagnosis not present

## 2022-07-27 MED ORDER — METFORMIN HCL 500 MG PO TABS: 500.0000 mg | ORAL_TABLET | Freq: Two times a day (BID) | ORAL | 0 refills | Status: DC

## 2022-07-30 ENCOUNTER — Other Ambulatory Visit: Payer: Self-pay | Admitting: Neurology

## 2022-08-04 ENCOUNTER — Encounter (HOSPITAL_BASED_OUTPATIENT_CLINIC_OR_DEPARTMENT_OTHER): Payer: Self-pay | Admitting: Urology

## 2022-08-04 NOTE — Progress Notes (Signed)
Spoke w/ via phone for pre-op interview--- [place]----   ISTAT            Lab results------ COVID test -----patient states asymptomatic no test needed Arrive at -------1000 NPO after MN NO Solid Food.  Clear liquids from MN until---0900 Med rec completed Medications to take morning of surgery -----Lexapro and Prilosec Diabetic medication ----- NONE AM of surgery Patient instructed no nail polish to be worn day of surgery Patient instructed to bring photo id and insurance card day of surgery Patient aware to have Driver (ride ) / caregiver Weyman Pedro   for 24 hours after surgery  Patient Special Instructions ----- Pre-Op special Istructions ----- Patient verbalized understanding of instructions that were given at this phone interview. Patient denies shortness of breath, chest pain, fever, cough at this phone interview.  Patient stated on the phone she cannot use CHG wipes, last time they caused hives.

## 2022-08-05 ENCOUNTER — Encounter: Payer: PPO | Attending: Psychology | Admitting: Psychology

## 2022-08-05 DIAGNOSIS — R4789 Other speech disturbances: Secondary | ICD-10-CM | POA: Insufficient documentation

## 2022-08-05 DIAGNOSIS — F419 Anxiety disorder, unspecified: Secondary | ICD-10-CM | POA: Insufficient documentation

## 2022-08-05 DIAGNOSIS — F329 Major depressive disorder, single episode, unspecified: Secondary | ICD-10-CM | POA: Diagnosis not present

## 2022-08-05 DIAGNOSIS — R4189 Other symptoms and signs involving cognitive functions and awareness: Secondary | ICD-10-CM | POA: Insufficient documentation

## 2022-08-09 ENCOUNTER — Ambulatory Visit (HOSPITAL_BASED_OUTPATIENT_CLINIC_OR_DEPARTMENT_OTHER): Payer: PPO | Admitting: Anesthesiology

## 2022-08-09 ENCOUNTER — Ambulatory Visit (HOSPITAL_BASED_OUTPATIENT_CLINIC_OR_DEPARTMENT_OTHER)
Admission: RE | Admit: 2022-08-09 | Discharge: 2022-08-09 | Disposition: A | Discharge status: Home / Self Care | Payer: PPO | Source: Ambulatory Visit | Attending: Urology | Admitting: Urology

## 2022-08-09 ENCOUNTER — Encounter (HOSPITAL_BASED_OUTPATIENT_CLINIC_OR_DEPARTMENT_OTHER): Payer: Self-pay | Admitting: Urology

## 2022-08-09 ENCOUNTER — Encounter (HOSPITAL_BASED_OUTPATIENT_CLINIC_OR_DEPARTMENT_OTHER)
Admission: RE | Disposition: A | Discharge status: Home / Self Care | Payer: Self-pay | Source: Ambulatory Visit | Attending: Urology

## 2022-08-09 DIAGNOSIS — Z90710 Acquired absence of both cervix and uterus: Secondary | ICD-10-CM | POA: Insufficient documentation

## 2022-08-09 DIAGNOSIS — N210 Calculus in bladder: Secondary | ICD-10-CM | POA: Diagnosis not present

## 2022-08-09 DIAGNOSIS — D649 Anemia, unspecified: Secondary | ICD-10-CM

## 2022-08-09 DIAGNOSIS — J45909 Unspecified asthma, uncomplicated: Secondary | ICD-10-CM

## 2022-08-09 DIAGNOSIS — G4730 Sleep apnea, unspecified: Secondary | ICD-10-CM | POA: Diagnosis not present

## 2022-08-09 DIAGNOSIS — F32A Depression, unspecified: Secondary | ICD-10-CM | POA: Diagnosis not present

## 2022-08-09 DIAGNOSIS — G2581 Restless legs syndrome: Secondary | ICD-10-CM | POA: Diagnosis not present

## 2022-08-09 DIAGNOSIS — F419 Anxiety disorder, unspecified: Secondary | ICD-10-CM | POA: Diagnosis not present

## 2022-08-09 DIAGNOSIS — G4733 Obstructive sleep apnea (adult) (pediatric): Secondary | ICD-10-CM | POA: Insufficient documentation

## 2022-08-09 DIAGNOSIS — Z01818 Encounter for other preprocedural examination: Secondary | ICD-10-CM

## 2022-08-09 DIAGNOSIS — N3281 Overactive bladder: Secondary | ICD-10-CM | POA: Diagnosis not present

## 2022-08-09 DIAGNOSIS — M1990 Unspecified osteoarthritis, unspecified site: Secondary | ICD-10-CM | POA: Insufficient documentation

## 2022-08-09 DIAGNOSIS — M797 Fibromyalgia: Secondary | ICD-10-CM | POA: Diagnosis not present

## 2022-08-09 DIAGNOSIS — K219 Gastro-esophageal reflux disease without esophagitis: Secondary | ICD-10-CM | POA: Diagnosis not present

## 2022-08-09 HISTORY — DX: Prediabetes: R73.03

## 2022-08-09 HISTORY — PX: CYSTOSCOPY WITH LITHOLAPAXY: SHX1425

## 2022-08-09 LAB — POCT I-STAT, CHEM 8
BUN: 12 mg/dL (ref 8–23)
Calcium, Ion: 1.25 mmol/L (ref 1.15–1.40)
Chloride: 103 mmol/L (ref 98–111)
Creatinine, Ser: 0.7 mg/dL (ref 0.44–1.00)
Glucose, Bld: 93 mg/dL (ref 70–99)
HCT: 37 % (ref 36.0–46.0)
Hemoglobin: 12.6 g/dL (ref 12.0–15.0)
Potassium: 3.4 mmol/L — ABNORMAL LOW (ref 3.5–5.1)
Sodium: 141 mmol/L (ref 135–145)
TCO2: 28 mmol/L (ref 22–32)

## 2022-08-09 SURGERY — CYSTOSCOPY, WITH BLADDER CALCULUS LITHOLAPAXY
Anesthesia: General

## 2022-08-09 MED ORDER — LACTATED RINGERS IV SOLN: INTRAVENOUS | Status: DC

## 2022-08-09 MED ORDER — EPHEDRINE SULFATE-NACL 50-0.9 MG/10ML-% IV SOSY
PREFILLED_SYRINGE | INTRAVENOUS | Status: DC | PRN
  Administered 2022-08-09: 10 mg via INTRAVENOUS

## 2022-08-09 MED ORDER — DIPHENHYDRAMINE HCL 50 MG/ML IJ SOLN
INTRAMUSCULAR | Status: DC | PRN
  Administered 2022-08-09: 12.5 mg via INTRAVENOUS

## 2022-08-09 MED ORDER — PROPOFOL 10 MG/ML IV BOLUS
INTRAVENOUS | Status: AC
  Filled 2022-08-09: qty 20

## 2022-08-09 MED ORDER — ONDANSETRON HCL 4 MG/2ML IJ SOLN
INTRAMUSCULAR | Status: AC
  Filled 2022-08-09: qty 2

## 2022-08-09 MED ORDER — FENTANYL CITRATE (PF) 100 MCG/2ML IJ SOLN
INTRAMUSCULAR | Status: DC | PRN
  Administered 2022-08-09 (×2): 25 ug via INTRAVENOUS
  Administered 2022-08-09: 50 ug via INTRAVENOUS

## 2022-08-09 MED ORDER — ACETAMINOPHEN 500 MG PO TABS
ORAL_TABLET | ORAL | Status: AC
  Filled 2022-08-09: qty 2

## 2022-08-09 MED ORDER — CIPROFLOXACIN IN D5W 400 MG/200ML IV SOLN
400.0000 mg | Freq: Two times a day (BID) | INTRAVENOUS | Status: DC
  Administered 2022-08-09: 400 mg via INTRAVENOUS

## 2022-08-09 MED ORDER — FENTANYL CITRATE (PF) 100 MCG/2ML IJ SOLN
INTRAMUSCULAR | Status: AC
  Filled 2022-08-09: qty 2

## 2022-08-09 MED ORDER — PROPOFOL 10 MG/ML IV BOLUS
INTRAVENOUS | Status: DC | PRN
  Administered 2022-08-09: 160 mg via INTRAVENOUS

## 2022-08-09 MED ORDER — SODIUM CHLORIDE 0.9 % IR SOLN
Status: DC | PRN
  Administered 2022-08-09: 6000 mL via INTRAVESICAL

## 2022-08-09 MED ORDER — DEXAMETHASONE SODIUM PHOSPHATE 10 MG/ML IJ SOLN
INTRAMUSCULAR | Status: AC
  Filled 2022-08-09: qty 1

## 2022-08-09 MED ORDER — DEXAMETHASONE SODIUM PHOSPHATE 10 MG/ML IJ SOLN
INTRAMUSCULAR | Status: DC | PRN
  Administered 2022-08-09: 10 mg via INTRAVENOUS

## 2022-08-09 MED ORDER — ACETAMINOPHEN 500 MG PO TABS
1000.0000 mg | ORAL_TABLET | Freq: Once | ORAL | Status: AC
  Administered 2022-08-09: 1000 mg via ORAL

## 2022-08-09 MED ORDER — AMISULPRIDE (ANTIEMETIC) 5 MG/2ML IV SOLN: 10.0000 mg | Freq: Once | INTRAVENOUS | Status: DC | PRN

## 2022-08-09 MED ORDER — LIDOCAINE 2% (20 MG/ML) 5 ML SYRINGE
INTRAMUSCULAR | Status: DC | PRN
  Administered 2022-08-09: 80 mg via INTRAVENOUS

## 2022-08-09 MED ORDER — EPHEDRINE 5 MG/ML INJ
INTRAVENOUS | Status: AC
  Filled 2022-08-09: qty 5

## 2022-08-09 MED ORDER — DOCUSATE SODIUM 100 MG PO CAPS: 100.0000 mg | ORAL_CAPSULE | Freq: Every day | ORAL | 0 refills | Status: DC | PRN

## 2022-08-09 MED ORDER — PROMETHAZINE HCL 25 MG/ML IJ SOLN: 6.2500 mg | INTRAMUSCULAR | Status: DC | PRN

## 2022-08-09 MED ORDER — LIDOCAINE HCL (PF) 2 % IJ SOLN
INTRAMUSCULAR | Status: AC
  Filled 2022-08-09: qty 5

## 2022-08-09 MED ORDER — FENTANYL CITRATE (PF) 100 MCG/2ML IJ SOLN: 25.0000 ug | INTRAMUSCULAR | Status: DC | PRN

## 2022-08-09 MED ORDER — OXYCODONE-ACETAMINOPHEN 5-325 MG PO TABS: 1.0000 | ORAL_TABLET | ORAL | 0 refills | Status: DC | PRN

## 2022-08-09 MED ORDER — CIPROFLOXACIN IN D5W 400 MG/200ML IV SOLN
INTRAVENOUS | Status: AC
  Filled 2022-08-09: qty 200

## 2022-08-09 MED ORDER — DIPHENHYDRAMINE HCL 50 MG/ML IJ SOLN
INTRAMUSCULAR | Status: AC
  Filled 2022-08-09: qty 1

## 2022-08-09 SURGICAL SUPPLY — 26 items
BAG DRAIN URO-CYSTO SKYTR STRL (DRAIN) ×2 IMPLANT
BAG DRN RND TRDRP ANRFLXCHMBR (UROLOGICAL SUPPLIES) ×1
BAG DRN UROCATH (DRAIN) ×1
BAG URINE DRAIN 2000ML AR STRL (UROLOGICAL SUPPLIES) IMPLANT
CATH FOLEY 2WAY SLVR  5CC 16FR (CATHETERS) ×1
CATH FOLEY 2WAY SLVR 5CC 16FR (CATHETERS) IMPLANT
CLOTH BEACON ORANGE TIMEOUT ST (SAFETY) ×2 IMPLANT
GLOVE BIO SURGEON STRL SZ7 (GLOVE) ×2 IMPLANT
GOWN STRL REUS W/TWL LRG LVL3 (GOWN DISPOSABLE) ×2 IMPLANT
HOLDER FOLEY CATH W/STRAP (MISCELLANEOUS) IMPLANT
IV NS 1000ML (IV SOLUTION) ×1
IV NS 1000ML BAXH (IV SOLUTION) ×2 IMPLANT
IV NS IRRIG 3000ML ARTHROMATIC (IV SOLUTION) ×2 IMPLANT
KIT TURNOVER CYSTO (KITS) ×2 IMPLANT
LASER FIB FLEXIVA PULSE ID 550 (Laser) ×2 IMPLANT
LASER FIB FLEXIVA PULSE ID 910 (Laser) ×2 IMPLANT
LOOP CUT BIPOLAR 24F LRG (ELECTROSURGICAL) IMPLANT
MANIFOLD NEPTUNE II (INSTRUMENTS) ×2 IMPLANT
NS IRRIG 500ML POUR BTL (IV SOLUTION) ×2 IMPLANT
PACK CYSTO (CUSTOM PROCEDURE TRAY) ×2 IMPLANT
SLEEVE SCD COMPRESS KNEE MED (STOCKING) ×2 IMPLANT
SYR 10ML LL (SYRINGE) ×2 IMPLANT
SYR TOOMEY IRRIG 70ML (MISCELLANEOUS) ×1
SYRINGE TOOMEY IRRIG 70ML (MISCELLANEOUS) IMPLANT
TUBE CONNECTING 12X1/4 (SUCTIONS) ×2 IMPLANT
TUBING UROLOGY SET (TUBING) ×2 IMPLANT

## 2022-08-09 NOTE — Anesthesia Postprocedure Evaluation (Signed)
Anesthesia Post Note  Patient: Cassandra [name]  Procedure(s) Performed: CYSTOLITHOLAPAXY     Patient location during evaluation: PACU Anesthesia Type: General Level of consciousness: sedated Pain management: pain level controlled Vital Signs Assessment: post-procedure vital signs reviewed and stable Respiratory status: spontaneous breathing and respiratory function stable Cardiovascular status: stable Postop Assessment: no apparent nausea or vomiting Anesthetic complications: no  No notable events documented.  Last Vitals:  Vitals:   08/09/22 1315 08/09/22 1330  BP: 116/62 (!) 100/58  Pulse: 71 71  Resp: 14 15  Temp:    SpO2: 100% 94%    Last Pain:  Vitals:   08/09/22 1330  TempSrc:   PainSc: 0-No pain                 , DANIEL

## 2022-08-09 NOTE — Op Note (Signed)
Operative Note  Preoperative diagnosis:  1.  Bladder stone 2. Eroded vaginal mesh  Postoperative diagnosis: 1.  Bladder stone  Procedure(s): 1.  Cystolitholapaxy >2.5cm 2. Fulguration of bladder 3.  Vaginoscopy  Surgeon: Rexene Alberts, MD  Assistants:  None  Anesthesia:  General  Complications:  None  EBL:  Minimal  Specimens: None  Drains/Catheters: 1.  18 Fr foley catheter  Intraoperative findings:   Near 3cm bladder stone at the right lateral wall. Successful lithotripsy of entirety of stone to the level of the bladder wall. No mesh visualized. Excellent hemostasis.  Indication:  Cassandra [name] is a 77 y.o. female with a bladder stone here for cystolitholapaxy.   Description of procedure: The indication, alternatives, benefits and risks were discussed with the patient and informed consent was obtained.  Patient was brought to the operating room table, positioned supine, secured with a safety strap.  Pneumatic compression devices were placed on the lower extremities.  After the administration of intravenous antibiotics and general anesthesia, the patient was repositioned into the dorsal lithotomy position.  All pressure points were carefully padded.    A continuous-flow laser bridge cystoscope with a 30 degree lens was advanced under direct vision was advanced through the urethra into the bladder.  There was an approximately 3 cm bladder stone adherent to the right lateral wall.  I surveyed the bladder with a 30 and 70 degree lens with no other stones present.  There were no suspicious bladder lesions.  I then introduced the scope into the vagina performed vaginoscopy and identified no evidence of eroded mesh.  I reintroduced the scope into the bladder.  Using 8000 nm laser fiber, the bladder stone was fragmented in its entirety to the level of the bladder wall.  The stones were then irrigated away.  There is a small amount of bleeding adjacent to stone at the right lateral  wall.  I was unable to visualize any mesh.  There is a small amount of edema present around the prior site of the stone.  Next, a 43 French continuous-flow resectoscope sheath with the visual obturator and a 30 degree lens was advanced under direct vision into the bladder.  I fulgurated around the prior site of the stone with excellent hemostasis.  The bladder was then drained and a 65 French Foley catheter was placed to gravity drainage.   Plan: Leave foley catheter in place for 3 days.  Will plan for void trial outpatient.  Matt R. [facility]  Pager: [phone]

## 2022-08-09 NOTE — Anesthesia Procedure Notes (Signed)
Procedure Name: LMA Insertion Date/Time: 08/09/2022 12:09 PM  Performed by: ,  D, CRNAPre-anesthesia Checklist: Patient identified, Emergency Drugs available, Suction available and Patient being monitored Patient Re-evaluated:Patient Re-evaluated prior to induction Oxygen Delivery Method: Circle system utilized Preoxygenation: Pre-oxygenation with 100% oxygen Induction Type: IV induction Ventilation: Mask ventilation without difficulty LMA: LMA inserted LMA Size: 4.0 Tube type: Oral Number of attempts: 1 Placement Confirmation: positive ETCO2 and breath sounds checked- equal and bilateral Tube secured with: Tape Dental Injury: Teeth and Oropharynx as per pre-operative assessment

## 2022-08-09 NOTE — Transfer of Care (Signed)
Immediate Anesthesia Transfer of Care Note  Patient: Cassandra [name]  Procedure(s) Performed: CYSTOLITHOLAPAXY  Patient Location: PACU  Anesthesia Type:General  Level of Consciousness: awake, alert , and oriented  Airway & Oxygen Therapy: Patient Spontanous Breathing and Patient connected to nasal cannula oxygen  Post-op Assessment:  Report given to RN and Post -op Vital signs reviewed and stable Post vital signs: Reviewed and stable  Last Vitals:  Vitals Value Taken Time  BP 120/74 08/09/22 1250  Temp    Pulse 75 08/09/22 1251  Resp 9 08/09/22 1251  SpO2 100 % 08/09/22 1251  Vitals shown include unvalidated device data.  Last Pain:  Vitals:   08/09/22 1014  TempSrc: Oral  PainSc: 0-No pain      Patients Stated Pain Goal: 5 ([id number])  Complications: No notable events documented.

## 2022-08-09 NOTE — Discharge Instructions (Addendum)
Activity:  You are encouraged to ambulate frequently (about every hour during waking hours) to help prevent blood clots from forming in your legs or lungs.    Diet: You should advance your diet as instructed by your physician.  It will be normal to have some bloating, nausea, and abdominal discomfort intermittently.  Prescriptions:  You will be provided a prescription for pain medication to take as needed.  If your pain is not severe enough to require the prescription pain medication, you may take extra strength Tylenol instead which will have less side effects.  You should also take a prescribed stool softener to avoid straining with bowel movements as the prescription pain medication may constipate you.  What to call us about: You should call the office [phone]) if you develop fever > 101 or develop persistent vomiting. Activity:  You are encouraged to ambulate frequently (about every hour during waking hours) to help prevent blood clots from forming in your legs or lungs.    You have a Foley catheter draining your bladder.  This will be removed in the office in approximately 3 days.         No acetaminophen/Tylenol until after 4:20 pm today if needed.   Post Anesthesia Home Care Instructions  Activity: Get plenty of rest for the remainder of the day. A responsible individual must stay with you for 24 hours following the procedure.  For the next 24 hours, DO NOT: -Drive a car -Paediatric nurse -Drink alcoholic beverages -Take any medication unless instructed by your physician -Make any legal decisions or sign important papers.  Meals: Start with liquid foods such as gelatin or soup. Progress to regular foods as tolerated. Avoid greasy, spicy, heavy foods. If nausea and/or vomiting occur, drink only clear liquids until the nausea and/or vomiting subsides. Call your physician if vomiting continues.  Special Instructions/Symptoms: Your throat may feel dry or sore from the  anesthesia or the breathing tube placed in your throat during surgery. If this causes discomfort, gargle with warm salt water. The discomfort should disappear within 24 hours.    CYSTOSCOPY HOME CARE INSTRUCTIONS  Activity: Rest for the remainder of the day.  Do not drive or operate equipment today.  You may resume normal activities in one to two days as instructed by your physician.   Meals: Drink plenty of liquids and eat light foods such as gelatin or soup this evening.  You may return to a normal meal plan tomorrow.  Return to Work: You may return to work in one to two days or as instructed by your physician.  Special Instructions / Symptoms: Call your physician if any of these symptoms occur:   -persistent or heavy bleeding  -bleeding which continues after first few urination  -large blood clots that are difficult to pass  -urine stream diminishes or stops completely  -fever equal to or higher than 101 degrees Farenheit.  -cloudy urine with a strong, foul odor  -severe pain  Females should always wipe from front to back after elimination.  You may feel some burning pain when you urinate.  This should disappear with time.  Applying moist heat to the lower abdomen or a hot tub bath may help relieve the pain.

## 2022-08-09 NOTE — Anesthesia Preprocedure Evaluation (Addendum)
Anesthesia Evaluation  Patient identified by MRN, date of birth, ID band Patient awake    Reviewed: Allergy & Precautions, H&P , NPO status , Patient's Chart, lab work & pertinent test results  History of Anesthesia Complications (+) PONV and history of anesthetic complications  Airway Mallampati: II  TM Distance: >3 FB Neck ROM: Full    Dental no notable dental hx. (+) Dental Advisory Given   Pulmonary asthma , sleep apnea    Pulmonary exam normal        Cardiovascular negative cardio ROS Normal cardiovascular exam     Neuro/Psych  PSYCHIATRIC DISORDERS  Depression    negative neurological ROS     GI/Hepatic negative GI ROS, Neg liver ROS,GERD  ,,  Endo/Other  negative endocrine ROS    Renal/GU negative Renal ROS     Musculoskeletal  (+) Arthritis , Osteoarthritis,  Fibromyalgia -  Abdominal   Peds  Hematology  (+) Blood dyscrasia, anemia   Anesthesia Other Findings   Reproductive/Obstetrics negative OB ROS                             Anesthesia Physical Anesthesia Plan  ASA: 3  Anesthesia Plan: General   Post-op Pain Management: Tylenol PO (pre-op)* and Toradol IV (intra-op)*   Induction: Intravenous  PONV Risk Score and Plan: 4 or greater and Ondansetron, Treatment may vary due to age or medical condition, Dexamethasone, Propofol infusion and Diphenhydramine  Airway Management Planned: LMA  Additional Equipment:   Intra-op Plan:   Post-operative Plan: Extubation in OR  Informed Consent: I have reviewed the patients History and Physical, chart, labs and discussed the procedure including the risks, benefits and alternatives for the proposed anesthesia with the patient or authorized representative who has indicated his/her understanding and acceptance.     Dental advisory given  Plan Discussed with: Anesthesiologist and CRNA  Anesthesia Plan Comments:          Anesthesia Quick Evaluation

## 2022-08-09 NOTE — H&P (Signed)
Cassandra [name] is a 77 year old female who is seen in follow-up today with a history of bladder stones due to exposed mesh, urinary urgency an incomplete bladder emptying.   #1. Bladder stones:  -She underwent MUS and A/P prolapse repair in 2011. She was found to have vaginal mesh extrusion at the apex and right lateral wall of the bladder with formation of a bladder stone. She underwent cystolitholapaxy and removal of the exposed bladder mesh with laser in 07/2016, 09/2016, 07/2017 and 12/2018.  -During last procedure, all bladder stone was removed as well as a portion of her extruded vaginal mesh of the bladder and Foley was drained with 64 French Foley catheter she underwent appropriate void trial 5 days later.  -Surveillance cystoscopy 07/2019 with no evidence of stone or eroded mesh and rather edema in the right lateral wall.  -She states that over the past several months, she has had worsening lower urinary tract symptoms including frequency, urgency and 3-4 times nocturia. She also notes that at times, she has to strain to urinate.  -Cystoscopy 07/06/2022: Approximately 3 cm bladder stone noted on the right lateral anterior wall. Unable to visualize any exposed mesh.   #2. Incomplete bladder emptying:  -She does have complaints of urinary frequency, urgency and sensation of the bladder emptying. UDS 07/02/2022 with Max capacity 200 ml. She was able to generate voluntary contraction void 114 and now with flow of 8 and mL/second. Max detrusor pressure was 9 cm H2O with average detrusor pressure 6 cm H2O. She had to strain or empty. PVR was 100 and mL.   #3. Urinary urgency: She does complain of urinary frequency, urgency every several hours and 3-4 times nocturia. She was previously taking Myrbetriq. He is currently not taking any medication at this time as she states that did not have any significant benefit.   Patient currently denies fever, chills, sweats, nausea, vomiting, abdominal or flank pain, gross  hematuria or dysuria.     ALLERGIES: Iodine keflex Penicillin sulfa    MEDICATIONS: Metformin Hcl 500 mg tablet  Omeprazole 20 mg capsule,delayed release  Escitalopram Oxalate 10 mg tablet  Fiber  Fluticasone Propionate 50 mcg/actuation spray, suspension  Gabapentin 100 mg capsule  Pramipexole Dihydrochloride 0.25 mg tablet  Spironolactone 25 mg tablet  Tart Cherry  Trazodone Hcl 50 mg tablet  Turmeric  Vitamin C  Xyzal 5 mg tablet     GU PSH: Complex cystometrogram, w/ void pressure and urethral pressure profile studies, any technique - 07/02/2022 Complex Uroflow - 07/02/2022 Cysto Bladder Stone <2.5cm - 2020, 2019, 2018 Cysto Bladder Stone >2.5cm - 2018 Cysto Remove Stent FB Com - 2020, 2018 Cysto Remove Stent FB Sim - 2019 Cystocele Repair Cystoscopy - 2021, 2020, 2019, 2019, 2019, 2018, 2018 Emg surf Electrd - 07/02/2022 Exam Vagina W/scope - 2018 Hysterectomy Intrabd voidng Press - 07/02/2022       PSH Notes: Skin Cancer Removed from face   NON-GU PSH: Breast Reduction Total Knee Replacement, Right     GU PMH: Urinary Urgency - 07/02/2022, - 04/27/2022, - 2019, - 2018, - 2018 Bladder Stone - 04/27/2022, - 2021, - 2020, - 2020, - 2020, - 2019, - 2019, - 2019, - 2019, - 2018 Pelvic/perineal pain - 2018 Stress Incontinence - 2018    NON-GU PMH: Arthritis Asthma GERD Other peripheral vertigo, unspecified ear Sleep Apnea    FAMILY HISTORY: 1 Daughter - Daughter 1 son - Son Death - Father, Mother Diabetes - Mother Kidney Cancer - Mother  Notes: mother passed @ age 42-old age  father passed @ age 14-burn victum   SOCIAL HISTORY: Marital Status: Widowed Preferred Language: English; Ethnicity: Not Hispanic Or Latino; Race: White Current Smoking Status: Patient has never smoked.   Tobacco Use Assessment Completed: Used Tobacco in last 30 days? Has never drank.  Drinks 1 caffeinated drink per day. Patient's occupation is/was retired.    REVIEW OF  SYSTEMS:    GU Review Female:   Patient denies frequent urination, hard to postpone urination, burning /pain with urination, get up at night to urinate, leakage of urine, stream starts and stops, trouble starting your stream, have to strain to urinate, and being pregnant.  Gastrointestinal (Upper):   Patient denies nausea, vomiting, and indigestion/ heartburn.  Gastrointestinal (Lower):   Patient denies diarrhea and constipation.  Constitutional:   Patient denies fatigue, night sweats, weight loss, and fever.  Skin:   Patient denies skin rash/ lesion and itching.  Eyes:   Patient denies blurred vision and double vision.  Ears/ Nose/ Throat:   Patient denies sore throat and sinus problems.  Hematologic/Lymphatic:   Patient denies swollen glands and easy bruising.  Cardiovascular:   Patient denies leg swelling and chest pains.  Respiratory:   Patient denies cough and shortness of breath.  Endocrine:   Patient denies excessive thirst.  Musculoskeletal:   Patient denies back pain and joint pain.  Neurological:   Patient denies headaches and dizziness.  Psychologic:   Patient denies depression and anxiety.   VITAL SIGNS: None   MULTI-SYSTEM PHYSICAL EXAMINATION:    Constitutional: Well-nourished. No physical deformities. Normally developed. Good grooming.  Respiratory: No labored breathing, no use of accessory muscles.   Cardiovascular: Normal temperature, normal extremity pulses, no swelling, no varicosities.  Gastrointestinal: No mass, no tenderness, no rigidity, non obese abdomen.     Complexity of Data:  Source Of History:  Patient, Medical Record Summary  Records Review:   Previous Doctor Records  Urine Test Review:   Urinalysis   PROCEDURES:         Flexible Cystoscopy - 52000  Risks, benefits, and some of the potential complications of the procedure were discussed at length with the patient including infection, bleeding, voiding discomfort, urinary retention, fever, chills, sepsis,  and others. All questions were answered. Informed consent was obtained. Antibiotic prophylaxis was given. Sterile technique and intraurethral analgesia were used.  Meatus:  Normal size. Normal location. Normal condition.  Urethra:  No hypermobility. No leakage.  Ureteral Orifices:  Normal location. Normal size. Normal shape. Effluxed clear urine.  Bladder:  No trabeculation. No tumors. Normal mucosa. Approximately 3 cm stone in the right lateral anterior wall. Unable to identify any exposed mesh.      The lower urinary tract was carefully examined. The procedure was well-tolerated and without complications. Antibiotic instructions were given. Instructions were given to call the office immediately for bloody urine, difficulty urinating, urinary retention, painful or frequent urination, fever, chills, nausea, vomiting or other illness. The patient stated that she understood these instructions and would comply with them.         Urinalysis Dipstick Dipstick Cont'd  Color: Yellow Bilirubin: Neg mg/dL  Appearance: Clear Ketones: Trace mg/dL  Specific Gravity: 1.025 Blood: Neg ery/uL  pH: 5.5 Protein: Neg mg/dL  Glucose: Neg mg/dL Urobilinogen: 1.0 mg/dL    Nitrites: Neg    Leukocyte Esterase: Neg leu/uL    ASSESSMENT:      ICD-10 Details  1 GU:   Bladder Stone - N21.0  2   Urinary Urgency - R39.15   3   Incomplete bladder emptying - R39.14    PLAN:           Document Letter(s):  Created for Patient: Clinical Summary         Notes:    1. Bladder stones: Approximately 3 cm bladder stone noted on the right lateral anterior wall. Unable to visualize any exposed mesh. We discussed options and she like to proceed with cystolitholopaxy. Discussed risk and benefits including risk of infection, hematuria, bladder perforation, recurrence. She elects to proceed. Sure letter sent.   #2. Incomplete bladder emptying: I reviewed urodynamics with evidence of a with bladder emptying. We discussed this  is a result of a reflexive bladder. She has low detrusor pressures. We discussed that no medication or surgery can improve this pressure. Is reasonable for her to continue to void at this time however I discussed option of CIC. She is hesitant proceed at this time. Discussed that if her PVR increases, would strongly reccomend CIC.   Urology Preoperative H&P   Chief Complaint: bladder stone  History of Present Illness: MARIECLAIRE HOGLUND is a 77 y.o. female with a bladder stone on the right lateral wall here for cystolitholapaxy. Denies fevers, chills, dysuria.    Past Medical History:  Diagnosis Date   Anemia    Anxiety    Asthma    Back pain    BPPV (benign paroxysmal positional vertigo)    Chronic allergic conjunctivitis    Depression    Diverticulosis    Fibromyalgia    Gallbladder problem    Gallstones    GERD (gastroesophageal reflux disease)    Hiatal hernia    History of bladder stone    Iron (Fe) deficiency anemia    Joint pain    Lump in female breast    Mild persistent asthma    followed by pcp--- dr Alfonso Patten. sharma ([facility]/asthma)   OA (osteoarthritis)    knees, hands   OAB (overactive bladder)    OSA (obstructive sleep apnea)    per pt has not used cpap several years   Pancreatic cyst    PONV (postoperative nausea and vomiting)    Pre-diabetes    RLS (restless legs syndrome)    Seasonal and perennial allergic rhinitis    SUI (stress urinary incontinence, female)    Swallowing difficulty    Vitamin B12 deficiency    Vitamin D deficiency    Wears glasses     Past Surgical History:  Procedure Laterality Date   BLADDER SUSPENSION  1990's   sling   bladder tacking  2010   with mesh   BREAST LUMPECTOMY Left 1985   Benign cyst   BREAST REDUCTION SURGERY Bilateral 2003 approx.   CORNEA LESION EXCISION Right 2005  approx.   CYSTOSCOPY N/A 07/12/2016   Procedure: CYSTOSCOPY, VAGINOSCOPY, EXAM UNDER ANESTHESIA, STONE LITHOTRIPSY,;  Surgeon: Cleon Gustin,  MD;  Location: [facility];  Service: Urology;  Laterality: N/A;   CYSTOSCOPY N/A 10/11/2016   Procedure: CYSTOSCOPY;  Surgeon: Cleon Gustin, MD;  Location: [facility];  Service: Urology;  Laterality: N/A;   CYSTOSCOPY WITH LITHOLAPAXY N/A 10/11/2016   Procedure: CYSTOSCOPY WITH LITHOLAPAXY/ EXCISION OF MESH;  Surgeon: Cleon Gustin, MD;  Location: [facility];  Service: Urology;  Laterality: N/A;   CYSTOSCOPY WITH LITHOLAPAXY N/A 01/22/2019   Procedure: CYSTOSCOPY WITH LITHOLAPAXY;  Surgeon: Cleon Gustin, MD;  Location: [facility]  [place];  Service: Urology;  Laterality: N/A;  1 HR   HOLMIUM LASER APPLICATION  [id number]   Procedure: HOLMIUM LASER APPLICATION;  Surgeon: Cleon Gustin, MD;  Location: [facility];  Service: Urology;;   HOLMIUM LASER APPLICATION N/A 0000000   Procedure: HOLMIUM LASER APPLICATION;  Surgeon: Cleon Gustin, MD;  Location: [facility];  Service: Urology;  Laterality: N/A;   RIGHT KNEE ARTHROSCOPY  2013   TOTAL KNEE ARTHROPLASTY Right 09/12/2012   Procedure: RIGHT TOTAL KNEE ARTHROPLASTY;  Surgeon: Gearlean Alf, MD;  Location: [facility];  Service: Orthopedics;  Laterality: Right;   VAGINAL HYSTERECTOMY  1984    Allergies:  Allergies  Allergen Reactions   Shellfish Allergy Hives   Cephalosporins Hives   Chlorhexidine Hives   Codeine     Other reaction(s): Unknown   Hydrochlorothiazide Hives   Misc. Sulfonamide Containing Compounds Hives   Penicillins Hives   Sulfa Antibiotics Hives   Sulfacetamide Sodium     Other reaction(s): Unknown    Family History  Problem Relation Age of Onset   Kidney cancer Mother    Diabetes Mother    Obesity Mother    Dementia Mother        died age 64   Other Father        burn victum   Alcoholism Father    Colon cancer Neg Hx    Inflammatory bowel disease Neg Hx    Liver disease Neg Hx    Pancreatic cancer  Neg Hx    Stomach cancer Neg Hx    Rectal cancer Neg Hx     Social History:  reports that she has never smoked. She has never used smokeless tobacco. She reports current alcohol use. She reports that she does not use drugs.  ROS: A complete review of systems was performed.  All systems are negative except for pertinent findings as noted.  Physical Exam:  Vital signs in last 24 hours: Temp:  [98.1 F (36.7 C)] 98.1 F (36.7 C) (03/11 1014) Pulse Rate:  [88] 88 (03/11 1014) Resp:  [18] 18 (03/11 1014) BP: (129)/(76) 129/76 (03/11 1014) SpO2:  [99 %] 99 % (03/11 1014) Weight:  [92.8 kg] 92.8 kg (03/11 1014) Constitutional:  Alert and oriented, No acute distress Cardiovascular: Regular rate and rhythm Respiratory: Normal respiratory effort, Lungs clear bilaterally GI: Abdomen is soft, nontender, nondistended, no abdominal masses GU: No CVA tenderness Lymphatic: No lymphadenopathy Neurologic: Grossly intact, no focal deficits Psychiatric: Normal mood and affect  Laboratory Data:  Recent Labs    08/09/22 1031  HGB 12.6  HCT 37.0    Recent Labs    08/09/22 1031  NA 141  K 3.4*  CL 103  GLUCOSE 93  BUN 12  CREATININE 0.70     Results for orders placed or performed during the hospital encounter of 08/09/22 (from the past 24 hour(s))  I-STAT, chem 8     Status: Abnormal   Collection Time: 08/09/22 10:31 AM  Result Value Ref Range   Sodium 141 135 - 145 mmol/L   Potassium 3.4 (L) 3.5 - 5.1 mmol/L   Chloride 103 98 - 111 mmol/L   BUN 12 8 - 23 mg/dL   Creatinine, Ser 0.70 0.44 - 1.00 mg/dL   Glucose, Bld 93 70 - 99 mg/dL   Calcium, Ion 1.25 1.15 - 1.40 mmol/L   TCO2 28 22 - 32 mmol/L   Hemoglobin 12.6 12.0 - 15.0 g/dL  HCT 37.0 36.0 - 46.0 %   No results found for this or any previous visit (from the past 240 hour(s)).  Renal Function: Recent Labs    08/09/22 1031  CREATININE 0.70   Estimated Creatinine Clearance: 68.4 mL/min (by C-G formula based on SCr  of 0.7 mg/dL).  Radiologic Imaging: No results found.  I independently reviewed the above imaging studies.  Assessment and Plan YASHEKA HALLERAN is a 77 y.o. female with a bladder stone on the right lateral wall here for cystolitholapaxy.    Discussed risk and benefits including risk of infection, hematuria, bladder perforation, recurrence. She elects to proceed.   Matt R.  MD 08/09/2022, 10:52 AM  [facility] Pager: [phone]): [phone]

## 2022-08-10 ENCOUNTER — Encounter: Payer: PPO | Admitting: Internal Medicine

## 2022-08-10 ENCOUNTER — Encounter (HOSPITAL_BASED_OUTPATIENT_CLINIC_OR_DEPARTMENT_OTHER): Payer: Self-pay | Admitting: Urology

## 2022-08-12 DIAGNOSIS — R3914 Feeling of incomplete bladder emptying: Secondary | ICD-10-CM | POA: Diagnosis not present

## 2022-08-12 DIAGNOSIS — N210 Calculus in bladder: Secondary | ICD-10-CM | POA: Diagnosis not present

## 2022-08-16 NOTE — Progress Notes (Signed)
Chief Complaint:   OBESITY Cassandra [name] is here to discuss her progress with her obesity treatment plan along with follow-up of her obesity related diagnoses. Cassandra [name] is on practicing portion control and making smarter food choices, such as increasing vegetables and decreasing simple carbohydrates and states she is following her eating plan approximately 50% of the time. Cassandra [name] states she is walking and doing chair exercises for 45 minutes 4-5 times per week.  Today's visit was #: 31 Starting weight: 193 lbs Starting date: 11/13/2019 Today's weight: 201 lbs Today's date: 07/27/2022 Total lbs lost to date: 0 Total lbs lost since last in-office visit: 0  Interim History: Cassandra [name] continues to work on her diet.  She is struggling to meet her protein goals.  Subjective:   1. Other insomnia Cassandra [name] is not sleeping as well and she has recently started taking melatonin.  No side effects were noted.  2. Pre-diabetes Cassandra [name]'s metformin was increased to twice daily last visit, but her pharmacy did not give her enough medicine.  Assessment/Plan:   1. Other insomnia Cassandra [name] is okay to continue melatonin, and will continue to monitor for progress.  2. Pre-diabetes Cassandra [name] agreed to increase metformin to 500 mg twice daily #60 with no refills.  3. BMI 33.0-33.9,adult  4. Obesity, Beginning BMI 32.12 Cassandra [name] is currently in the action stage of change. As such, her goal is to continue with weight loss efforts. She has agreed to practicing portion control and making smarter food choices, such as increasing vegetables and decreasing simple carbohydrates.   I emphasized the importance of lean protein and vegetables to prevent sarcopenia and decreased RMR with the patient.  Exercise goals: As is.   Behavioral modification strategies: increasing lean protein intake.  Cassandra [name] has agreed to follow-up with our clinic in 4 weeks. She was informed of the importance of frequent follow-up visits to maximize her success  with intensive lifestyle modifications for her multiple health conditions.   Objective:   Blood pressure 118/74, pulse 74, temperature 98.5 F (36.9 C), height 5\' 5"  (1.651 m), weight 201 lb (91.2 kg), SpO2 96 %. Body mass index is 33.45 kg/m.  Lab Results  Component Value Date   CREATININE 0.70 08/09/2022   BUN 12 08/09/2022   NA 141 08/09/2022   K 3.4 (L) 08/09/2022   CL 103 08/09/2022   CO2 25 05/11/2022   Lab Results  Component Value Date   ALT 6 05/11/2022   AST 22 05/11/2022   ALKPHOS 88 05/11/2022   BILITOT 0.4 05/11/2022   Lab Results  Component Value Date   HGBA1C 5.9 (H) 05/11/2022   HGBA1C 5.7 (H) 01/13/2022   HGBA1C 5.6 04/17/2020   HGBA1C 5.7 (H) 11/13/2019   HGBA1C 5.4 08/29/2019   Lab Results  Component Value Date   INSULIN 22.3 05/11/2022   INSULIN 20.0 01/13/2022   INSULIN 24.4 07/21/2021   INSULIN 19.1 11/26/2020   INSULIN 15.9 04/17/2020   Lab Results  Component Value Date   TSH 1.260 05/11/2022   Lab Results  Component Value Date   CHOL 161 01/13/2022   HDL 53 01/13/2022   LDLCALC 88 01/13/2022   TRIG 108 01/13/2022   CHOLHDL 3.0 11/26/2020   Lab Results  Component Value Date   VD25OH 45.8 05/11/2022   VD25OH 55.1 01/13/2022   VD25OH 69.4 07/21/2021   Lab Results  Component Value Date   WBC 5.6 05/11/2022   HGB 12.6 08/09/2022   HCT 37.0 08/09/2022   MCV 81 05/11/2022  PLT 282 05/11/2022   Lab Results  Component Value Date   IRON 56 07/21/2021   TIBC 449 07/21/2021   FERRITIN 11 (L) 07/21/2021   Attestation Statements:   Reviewed by clinician on day of visit: allergies, medications, problem list, medical history, surgical history, family history, social history, and previous encounter notes.   I, Trixie Dredge, am acting as transcriptionist for Dennard Nip, MD.  I have reviewed the above documentation for accuracy and completeness, and I agree with the above. -  Dennard Nip, MD

## 2022-08-19 ENCOUNTER — Other Ambulatory Visit (INDEPENDENT_AMBULATORY_CARE_PROVIDER_SITE_OTHER): Payer: Self-pay | Admitting: Family Medicine

## 2022-08-19 ENCOUNTER — Ambulatory Visit (INDEPENDENT_AMBULATORY_CARE_PROVIDER_SITE_OTHER): Payer: PPO | Admitting: Internal Medicine

## 2022-08-19 ENCOUNTER — Encounter: Payer: Self-pay | Admitting: Internal Medicine

## 2022-08-19 VITALS — BP 110/70 | HR 78 | Temp 97.3°F | Ht 66.0 in | Wt 207.5 lb

## 2022-08-19 DIAGNOSIS — R7302 Impaired glucose tolerance (oral): Secondary | ICD-10-CM

## 2022-08-19 DIAGNOSIS — E785 Hyperlipidemia, unspecified: Secondary | ICD-10-CM | POA: Diagnosis not present

## 2022-08-19 DIAGNOSIS — Z0000 Encounter for general adult medical examination without abnormal findings: Secondary | ICD-10-CM | POA: Diagnosis not present

## 2022-08-19 DIAGNOSIS — R7303 Prediabetes: Secondary | ICD-10-CM

## 2022-08-19 DIAGNOSIS — E538 Deficiency of other specified B group vitamins: Secondary | ICD-10-CM

## 2022-08-19 DIAGNOSIS — Z6833 Body mass index (BMI) 33.0-33.9, adult: Secondary | ICD-10-CM | POA: Diagnosis not present

## 2022-08-19 DIAGNOSIS — E559 Vitamin D deficiency, unspecified: Secondary | ICD-10-CM | POA: Diagnosis not present

## 2022-08-19 LAB — LIPID PANEL
Cholesterol: 160 mg/dL (ref 0–200)
HDL: 59.4 mg/dL (ref >39.00)
LDL Cholesterol: 82 mg/dL (ref 0–99)
NonHDL: 100.43
Total CHOL/HDL Ratio: 3
Triglycerides: 92 mg/dL (ref 0.0–149.0)
VLDL: 18.4 mg/dL (ref 0.0–40.0)

## 2022-08-19 LAB — CBC WITH DIFFERENTIAL/PLATELET
Basophils Absolute: 0 10*3/uL (ref 0.0–0.1)
Basophils Relative: 0.4 % (ref 0.0–3.0)
Eosinophils Absolute: 0.1 10*3/uL (ref 0.0–0.7)
Eosinophils Relative: 1.4 % (ref 0.0–5.0)
HCT: 37.4 % (ref 36.0–46.0)
Hemoglobin: 11.8 g/dL — ABNORMAL LOW (ref 12.0–15.0)
Lymphocytes Relative: 31.8 % (ref 12.0–46.0)
Lymphs Abs: 2.8 10*3/uL (ref 0.7–4.0)
MCHC: 31.6 g/dL (ref 30.0–36.0)
MCV: 76.6 fl — ABNORMAL LOW (ref 78.0–100.0)
Monocytes Absolute: 0.7 10*3/uL (ref 0.1–1.0)
Monocytes Relative: 7.6 % (ref 3.0–12.0)
Neutro Abs: 5.2 10*3/uL (ref 1.4–7.7)
Neutrophils Relative %: 58.8 % (ref 43.0–77.0)
Platelets: 332 10*3/uL (ref 150.0–400.0)
RBC: 4.88 Mil/uL (ref 3.87–5.11)
RDW: 16.3 % — ABNORMAL HIGH (ref 11.5–15.5)
WBC: 8.9 10*3/uL (ref 4.0–10.5)

## 2022-08-19 LAB — COMPREHENSIVE METABOLIC PANEL
ALT: 6 U/L (ref 0–35)
AST: 20 U/L (ref 0–37)
Albumin: 4.5 g/dL (ref 3.5–5.2)
Alkaline Phosphatase: 84 U/L (ref 39–117)
BUN: 13 mg/dL (ref 6–23)
CO2: 29 mEq/L (ref 19–32)
Calcium: 10.1 mg/dL (ref 8.4–10.5)
Chloride: 102 mEq/L (ref 96–112)
Creatinine, Ser: 0.77 mg/dL (ref 0.40–1.20)
GFR: 75.5 mL/min (ref >60.00)
Glucose, Bld: 95 mg/dL (ref 70–99)
Potassium: 4.3 mEq/L (ref 3.5–5.1)
Sodium: 139 mEq/L (ref 135–145)
Total Bilirubin: 0.4 mg/dL (ref 0.2–1.2)
Total Protein: 7.9 g/dL (ref 6.0–8.3)

## 2022-08-19 LAB — VITAMIN D 25 HYDROXY (VIT D DEFICIENCY, FRACTURES): VITD: 57.07 ng/mL (ref 30.00–100.00)

## 2022-08-19 LAB — TSH: TSH: 0.84 u[IU]/mL (ref 0.35–5.50)

## 2022-08-19 LAB — VITAMIN B12: Vitamin B-12: 507 pg/mL (ref 211–911)

## 2022-08-19 LAB — HEMOGLOBIN A1C: Hgb A1c MFr Bld: 6.3 % (ref 4.6–6.5)

## 2022-08-19 NOTE — Progress Notes (Signed)
Established Patient Office Visit     CC/Reason for Visit: Annual preventive exam and subsequent Medicare wellness visit  HPI: Cassandra [name] is a 77 y.o. female who is coming in today for the above mentioned reasons. Past Medical History is significant for:  Obesity, GERD, vertigo and mild intermittent asthma.  She is doing well without major concerns or complaints.  She has routine eye and dental care.  All immunizations are up-to-date.  She will be getting her mammogram and DEXA scan soon.  She follows with GYN routinely.  She had a colonoscopy in 2015 and is a 10-year follow-up.   Past Medical/Surgical History: Past Medical History:  Diagnosis Date   Anemia    Anxiety    Asthma    Back pain    BPPV (benign paroxysmal positional vertigo)    Chronic allergic conjunctivitis    Depression    Diverticulosis    Fibromyalgia    Gallbladder problem    Gallstones    GERD (gastroesophageal reflux disease)    Hiatal hernia    History of bladder stone    Iron (Fe) deficiency anemia    Joint pain    Lump in female breast    Mild persistent asthma    followed by pcp--- dr Alfonso Patten. sharma ([place] allergy/asthma)   OA (osteoarthritis)    knees, hands   OAB (overactive bladder)    OSA (obstructive sleep apnea)    per pt has not used cpap several years   Pancreatic cyst    PONV (postoperative nausea and vomiting)    Pre-diabetes    RLS (restless legs syndrome)    Seasonal and perennial allergic rhinitis    SUI (stress urinary incontinence, female)    Swallowing difficulty    Vitamin B12 deficiency    Vitamin D deficiency    Wears glasses     Past Surgical History:  Procedure Laterality Date   BLADDER SUSPENSION  1990's   sling   bladder tacking  2010   with mesh   BREAST LUMPECTOMY Left 1985   Benign cyst   BREAST REDUCTION SURGERY Bilateral 2003 approx.   CORNEA LESION EXCISION Right 2005  approx.   CYSTOSCOPY N/A 07/12/2016   Procedure: CYSTOSCOPY, VAGINOSCOPY, EXAM  UNDER ANESTHESIA, STONE LITHOTRIPSY,;  Surgeon: Cleon Gustin, MD;  Location[facility];  Service: Urology;  Laterality: N/A;   CYSTOSCOPY N/A 10/11/2016   Procedure: CYSTOSCOPY;  Surgeon: Cleon Gustin, MD;  Location: [facility];  Service: Urology;  Laterality: N/A;   CYSTOSCOPY WITH LITHOLAPAXY N/A 10/11/2016   Procedure: CYSTOSCOPY WITH LITHOLAPAXY/ EXCISION OF MESH;  Surgeon: Cleon Gustin, MD;  Location: [facility];  Service: Urology;  Laterality: N/A;   CYSTOSCOPY WITH LITHOLAPAXY N/A 01/22/2019   Procedure: CYSTOSCOPY WITH LITHOLAPAXY;  Surgeon: Cleon Gustin, MD;  Location: [facility];  Service: Urology;  Laterality: N/A;  1 HR   CYSTOSCOPY WITH LITHOLAPAXY N/A 08/09/2022   Procedure: CYSTOLITHOLAPAXY;  Surgeon: Janith Lima, MD;  Location: [facility];  Service: Urology;  Laterality: N/A;  45 MINUTES NEEDED FOR CASE   HOLMIUM LASER APPLICATION  [id number]   Procedure: HOLMIUM LASER APPLICATION;  Surgeon: Cleon Gustin, MD;  Location: [facility];  Service: Urology;;   HOLMIUM LASER APPLICATION N/A 0000000   Procedure: HOLMIUM LASER APPLICATION;  Surgeon: Cleon Gustin, MD;  Location: [facility];  Service: Urology;  Laterality: N/A;  RIGHT KNEE ARTHROSCOPY  2013   TOTAL KNEE ARTHROPLASTY Right 09/12/2012   Procedure: RIGHT TOTAL KNEE ARTHROPLASTY;  Surgeon: Gearlean Alf, MD;  Location: [facility];  Service: Orthopedics;  Laterality: Right;   VAGINAL HYSTERECTOMY  1984    Social History:  reports that she has never smoked. She has never used smokeless tobacco. She reports current alcohol use. She reports that she does not use drugs.  Allergies: Allergies  Allergen Reactions   Cephalosporins Hives   Chlorhexidine Hives   Codeine     Other reaction(s): Unknown   Hydrochlorothiazide Hives   Misc. Sulfonamide Containing Compounds Hives    Penicillins Hives   Sulfa Antibiotics Hives   Sulfacetamide Sodium     Other reaction(s): Unknown    Family History:  Family History  Problem Relation Age of Onset   Kidney cancer Mother    Diabetes Mother    Obesity Mother    Dementia Mother        died age 28   Other Father        burn victum   Alcoholism Father    Colon cancer Neg Hx    Inflammatory bowel disease Neg Hx    Liver disease Neg Hx    Pancreatic cancer Neg Hx    Stomach cancer Neg Hx    Rectal cancer Neg Hx      Current Outpatient Medications:    EPINEPHrine 0.3 mg/0.3 mL IJ SOAJ injection, Inject 0.3 mg into the muscle as needed for anaphylaxis., Disp: , Rfl:    escitalopram (LEXAPRO) 10 MG tablet, Take 1 tablet (10 mg total) by mouth daily with breakfast., Disp: 30 tablet, Rfl: 0   fluticasone (FLONASE) 50 MCG/ACT nasal spray, Place into both nostrils., Disp: , Rfl:    gabapentin (NEURONTIN) 100 MG capsule, TAKE 2 CAPSULES BY MOUTH AT BEDTIME. (Patient taking differently: Take 300 mg by mouth at bedtime.), Disp: 180 capsule, Rfl: 0   levocetirizine (XYZAL) 5 MG tablet, Take 5 mg by mouth every evening. , Disp: , Rfl: 1   metFORMIN (GLUCOPHAGE) 500 MG tablet, Take 1 tablet (500 mg total) by mouth 2 (two) times daily with a meal. TAKE 1 TABLET BY MOUTH EVERY DAY WITH BREAKFAST, Disp: 60 tablet, Rfl: 0   Misc Natural Products (TART CHERRY ADVANCED) CAPS, Take 1 capsule by mouth. 1200mg  capsule, Disp: , Rfl:    omeprazole (PRILOSEC) 20 MG capsule, Take 20 mg by mouth 2 (two) times daily before a meal., Disp: , Rfl:    pramipexole (MIRAPEX) 0.25 MG tablet, TAKE 1 TABLET AT NIGHT AS NEEDED FOR RESTLESS LEG SYNDROME, Disp: 90 tablet, Rfl: 1   psyllium (REGULOID) 0.52 g capsule, Take 0.52 g by mouth daily., Disp: , Rfl:    spironolactone (ALDACTONE) 25 MG tablet, TAKE 1 TABLET (25 MG TOTAL) BY MOUTH DAILY., Disp: 90 tablet, Rfl: 1   traZODone (DESYREL) 50 MG tablet, TAKE 1 TABLET BY MOUTH AT BEDTIME AS NEEDED FOR  SLEEP., Disp: 90 tablet, Rfl: 1  Review of Systems:  Negative unless indicated in HPI.   Physical Exam: Vitals:   08/19/22 1334  BP: 110/70  Pulse: 78  Temp: (!) 97.3 F (36.3 C)  TempSrc: Oral  SpO2: 99%  Weight: 207 lb 8 oz (94.1 kg)  Height: 5\' 6"  (1.676 m)    Body mass index is 33.49 kg/m.   Physical Exam Vitals reviewed.  Constitutional:      General: She is not in acute distress.    Appearance:  Normal appearance. She is not ill-appearing, toxic-appearing or diaphoretic.  HENT:     Head: Normocephalic.     Right Ear: Tympanic membrane, ear canal and external ear normal. There is no impacted cerumen.     Left Ear: Tympanic membrane, ear canal and external ear normal. There is no impacted cerumen.     Nose: Nose normal.     Mouth/Throat:     Mouth: Mucous membranes are moist.     Pharynx: Oropharynx is clear. No oropharyngeal exudate or posterior oropharyngeal erythema.  Eyes:     General: No scleral icterus.       Right eye: No discharge.        Left eye: No discharge.     Conjunctiva/sclera: Conjunctivae normal.     Pupils: Pupils are equal, round, and reactive to light.  Neck:     Vascular: No carotid bruit.  Cardiovascular:     Rate and Rhythm: Normal rate and regular rhythm.     Pulses: Normal pulses.     Heart sounds: Normal heart sounds.  Pulmonary:     Effort: Pulmonary effort is normal. No respiratory distress.     Breath sounds: Normal breath sounds.  Abdominal:     General: Abdomen is flat. Bowel sounds are normal.     Palpations: Abdomen is soft.  Musculoskeletal:        General: Normal range of motion.     Cervical back: Normal range of motion.  Skin:    General: Skin is warm and dry.  Neurological:     General: No focal deficit present.     Mental Status: She is alert and oriented to person, place, and time. Mental status is at baseline.  Psychiatric:        Mood and Affect: Mood normal.        Behavior: Behavior normal.        Thought  Content: Thought content normal.        Judgment: Judgment normal.    Subsequent Medicare wellness visit   1. Risk factors, based on past  M,S,F - Cardiac Risk Factors include: advanced age (>93men, >19 women)   2.  Physical activities: Dietary issues and exercise activities discussed:  Current Exercise Habits: Home exercise routine, Type of exercise: walking, Time (Minutes): 40, Frequency (Times/Week): 7, Weekly Exercise (Minutes/Week): 280, Intensity: Moderate   3.  Depression/mood:  [place] Office Visit from 08/19/2022 in [place] at [facility] Total Score 0        4.  ADL's:    08/19/2022    1:32 PM 08/09/2022   10:17 AM  In your present state of health, do you have any difficulty performing the following activities:  Hearing? 1 1  Comment  bil hearing aids  Vision? 0 0  Difficulty concentrating or making decisions? 0 0  Walking or climbing stairs? 0 0  Dressing or bathing?  0  Doing errands, shopping? 0   Preparing Food and eating ? N   Using the Toilet? N   In the past six months, have you accidently leaked urine? Y   Do you have problems with loss of bowel control? Y   Managing your Medications? N   Managing your Finances? N   Housekeeping or managing your Housekeeping? N      5.  Fall risk:     01/22/2019   10:09 AM 12/17/2020   12:29 PM 06/16/2021    2:17 PM 11/22/2021    2:27  PM 08/19/2022    1:36 PM  Fall Risk  Falls in the past year?   0  1  Was there an injury with Fall?   0  0  Fall Risk Category Calculator   0  1  Fall Risk Category (Retired)   Low    (RETIRED) Patient Fall Risk Level Low fall risk Low fall risk Low fall risk Low fall risk   Patient at Risk for Falls Due to   No Fall Risks    Fall risk Follow up     Falls evaluation completed     6.  Home safety: No problems identified   7.  Height weight, and visual acuity: height and weight as above, vision/hearing: Vision Screening   Right eye Left eye Both  eyes  Without correction     With correction 20/20 20/20 20/20      8.  Counseling: Counseling given: Not Answered    9. Lab orders based on risk factors: Laboratory update will be reviewed   10. Cognitive assessment:        08/19/2022    1:37 PM  6CIT Screen  What Year? 0 points  What month? 0 points  What time? 0 points  Count back from 20 0 points  Months in reverse 0 points  Repeat phrase 0 points  Total Score 0 points     11. Screening: Patient provided with a written and personalized 5-10 year screening schedule in the AVS. Health Maintenance  Topic Date Due   Colon Cancer Screening  06/01/2023   Medicare Annual Wellness Visit  08/19/2023   DTaP/Tdap/Td vaccine (3 - Td or Tdap) 10/28/2028   Pneumonia Vaccine  Completed   Flu Shot  Completed   DEXA scan (bone density measurement)  Completed   COVID-19 Vaccine  Completed   Hepatitis C Screening: USPSTF Recommendation to screen - Ages 84-79 yo.  Completed   Zoster (Shingles) Vaccine  Completed   HPV Vaccine  Aged Out    73. Provider List Update: Patient Care Team    Relationship Specialty Notifications Start End  Isaac Bliss, Rayford Halsted, MD PCP - General Internal Medicine  11/16/18      13. Advance Directives: Does Patient Have a Medical Advance Directive?: Yes Type of Advance Directive: Healthcare Power of Attorney, Living will, Out of facility DNR (pink MOST or yellow form) Copy of [place] in Chart?: No - copy requested  14. Opioids: Patient is not on any opioid prescriptions and has no risk factors for a substance use disorder.   15.   Goals      Weight (lb) < 200 lb (90.7 kg)         I have personally reviewed and noted the following in the patient's chart:   Medical and social history Use of alcohol, tobacco or illicit drugs  Current medications and supplements Functional ability and status Nutritional status Physical activity Advanced directives List of other  physicians Hospitalizations, surgeries, and ER visits in previous 12 months Vitals Screenings to include cognitive, depression, and falls Referrals and appointments  In addition, I have reviewed and discussed with patient certain preventive protocols, quality metrics, and best practice recommendations. A written personalized care plan for preventive services as well as general preventive health recommendations were provided to patient.   Impression and Plan:  Encounter for preventive health examination  Vitamin D deficiency - Plan: Vitamin D, 25-hydroxy  Vitamin B12 deficiency - Plan: Vitamin B12  Hyperlipidemia, unspecified hyperlipidemia type -  Plan: Lipid panel  IGT (impaired glucose tolerance) - Plan: CBC with Differential/Platelet, Comprehensive metabolic panel, Hemoglobin A1c  BMI 33.0-33.9,adult - Plan: TSH  -Recommend routine eye and dental care. -Healthy lifestyle discussed in detail. -Labs to be updated today. -Prostate cancer screening: N/A Health Maintenance  Topic Date Due   Colon Cancer Screening  06/01/2023   Medicare Annual Wellness Visit  08/19/2023   DTaP/Tdap/Td vaccine (3 - Td or Tdap) 10/28/2028   Pneumonia Vaccine  Completed   Flu Shot  Completed   DEXA scan (bone density measurement)  Completed   COVID-19 Vaccine  Completed   Hepatitis C Screening: USPSTF Recommendation to screen - Ages 81-79 yo.  Completed   Zoster (Shingles) Vaccine  Completed   HPV Vaccine  Aged Out          Isaac Bliss, MD [facility]

## 2022-08-20 DIAGNOSIS — Z1231 Encounter for screening mammogram for malignant neoplasm of breast: Secondary | ICD-10-CM | POA: Diagnosis not present

## 2022-08-20 DIAGNOSIS — M8589 Other specified disorders of bone density and structure, multiple sites: Secondary | ICD-10-CM | POA: Diagnosis not present

## 2022-08-20 DIAGNOSIS — Z780 Asymptomatic menopausal state: Secondary | ICD-10-CM | POA: Diagnosis not present

## 2022-08-20 LAB — HM DEXA SCAN

## 2022-08-20 LAB — HM MAMMOGRAPHY

## 2022-08-24 ENCOUNTER — Encounter (INDEPENDENT_AMBULATORY_CARE_PROVIDER_SITE_OTHER): Payer: Self-pay | Admitting: Family Medicine

## 2022-08-24 ENCOUNTER — Ambulatory Visit (INDEPENDENT_AMBULATORY_CARE_PROVIDER_SITE_OTHER): Payer: PPO | Admitting: Family Medicine

## 2022-08-24 VITALS — BP 100/55 | HR 78 | Temp 98.5°F | Ht 66.0 in | Wt 200.0 lb

## 2022-08-24 DIAGNOSIS — I959 Hypotension, unspecified: Secondary | ICD-10-CM | POA: Insufficient documentation

## 2022-08-24 DIAGNOSIS — E669 Obesity, unspecified: Secondary | ICD-10-CM | POA: Diagnosis not present

## 2022-08-24 DIAGNOSIS — E559 Vitamin D deficiency, unspecified: Secondary | ICD-10-CM

## 2022-08-24 DIAGNOSIS — I9589 Other hypotension: Secondary | ICD-10-CM

## 2022-08-24 DIAGNOSIS — Z6832 Body mass index (BMI) 32.0-32.9, adult: Secondary | ICD-10-CM

## 2022-08-24 NOTE — Progress Notes (Unsigned)
Chief Complaint:   OBESITY Cassandra [name] is here to discuss her progress with her obesity treatment plan along with follow-up of her obesity related diagnoses. Cassandra [name] is on practicing portion control and making smarter food choices, such as increasing vegetables and decreasing simple carbohydrates and states she is following her eating plan approximately 50% of the time. Cassandra [name] states she is doing 0 minutes 0 times per week.  Today's visit was #: 28 Starting weight: 193 lbs Starting date: 11/13/2019 Today's weight: 200 lbs Today's date: 08/24/2022 Total lbs lost to date: 0 Total lbs lost since last in-office visit: 1  Interim History: Cassandra [name] continues to work on her diet and weight loss. She is making small deviations that increased her calories. She is working on increasing her protein.   Subjective:   1. Other specified hypotension Cassandra [name]'s blood pressure remains low. She is asymptomatic but she is worried that it will decrease enough for her to get dizzy or lose her balance.   2. Vitamin D deficiency Cassandra [name]'s Vitamin D level was at goal. She is on Vitamin D supplementation in her multivitamins. I discussed labs with the patient today.   Assessment/Plan:   1. Other specified hypotension Cassandra [name] agreed to decrease spironolactone to 1/2 tablet by mouth, and she will continue to hydrate.  We will follow-up in 1 month.  2. Vitamin D deficiency Cassandra [name] will continue her vitamins as is, and we we will continue to follow.  3. BMI 32.0-32.9,adult  4. Obesity, Beginning BMI 32.12 Cassandra [name] is currently in the action stage of change. As such, her goal is to continue with weight loss efforts. She has agreed to the Category 2 Plan.   Behavioral modification strategies: no skipping meals, meal planning and cooking strategies, and emotional eating strategies.  Cassandra [name] has agreed to follow-up with our clinic in 4 weeks. She was informed of the importance of frequent follow-up visits to maximize her success  with intensive lifestyle modifications for her multiple health conditions.   Objective:   Blood pressure (!) 100/55, pulse 78, temperature 98.5 F (36.9 C), height 5\' 6"  (1.676 m), weight 200 lb (90.7 kg), SpO2 96 %. Body mass index is 32.28 kg/m.  Lab Results  Component Value Date   CREATININE 0.77 08/19/2022   BUN 13 08/19/2022   NA 139 08/19/2022   K 4.3 08/19/2022   CL 102 08/19/2022   CO2 29 08/19/2022   Lab Results  Component Value Date   ALT 6 08/19/2022   AST 20 08/19/2022   ALKPHOS 84 08/19/2022   BILITOT 0.4 08/19/2022   Lab Results  Component Value Date   HGBA1C 6.3 08/19/2022   HGBA1C 5.9 (H) 05/11/2022   HGBA1C 5.7 (H) 01/13/2022   HGBA1C 5.6 04/17/2020   HGBA1C 5.7 (H) 11/13/2019   Lab Results  Component Value Date   INSULIN 22.3 05/11/2022   INSULIN 20.0 01/13/2022   INSULIN 24.4 07/21/2021   INSULIN 19.1 11/26/2020   INSULIN 15.9 04/17/2020   Lab Results  Component Value Date   TSH 0.84 08/19/2022   Lab Results  Component Value Date   CHOL 160 08/19/2022   HDL 59.40 08/19/2022   LDLCALC 82 08/19/2022   TRIG 92.0 08/19/2022   CHOLHDL 3 08/19/2022   Lab Results  Component Value Date   VD25OH 57.07 08/19/2022   VD25OH 45.8 05/11/2022   VD25OH 55.1 01/13/2022   Lab Results  Component Value Date   WBC 8.9 08/19/2022   HGB 11.8 (L) 08/19/2022   HCT  37.4 08/19/2022   MCV 76.6 (L) 08/19/2022   PLT 332.0 08/19/2022   Lab Results  Component Value Date   IRON 56 07/21/2021   TIBC 449 07/21/2021   FERRITIN 11 (L) 07/21/2021   Attestation Statements:   Reviewed by clinician on day of visit: allergies, medications, problem list, medical history, surgical history, family history, social history, and previous encounter notes.  Time spent on visit including pre-visit chart review and post-visit care and charting was 30 minutes.   I, Trixie Dredge, am acting as transcriptionist for Dennard Nip, MD.  I have reviewed the above  documentation for accuracy and completeness, and I agree with the above. -  Dennard Nip, MD

## 2022-08-31 ENCOUNTER — Encounter: Payer: Self-pay | Admitting: Internal Medicine

## 2022-08-31 ENCOUNTER — Other Ambulatory Visit: Payer: Self-pay | Admitting: Internal Medicine

## 2022-08-31 DIAGNOSIS — D509 Iron deficiency anemia, unspecified: Secondary | ICD-10-CM

## 2022-09-01 ENCOUNTER — Other Ambulatory Visit (INDEPENDENT_AMBULATORY_CARE_PROVIDER_SITE_OTHER): Payer: PPO

## 2022-09-01 DIAGNOSIS — D509 Iron deficiency anemia, unspecified: Secondary | ICD-10-CM | POA: Diagnosis not present

## 2022-09-01 LAB — IBC + FERRITIN
Ferritin: 7.4 ng/mL — ABNORMAL LOW (ref 10.0–291.0)
Iron: 49 ug/dL (ref 42–145)
Saturation Ratios: 9.4 % — ABNORMAL LOW (ref 20.0–50.0)
TIBC: 519.4 ug/dL — ABNORMAL HIGH (ref 250.0–450.0)
Transferrin: 371 mg/dL — ABNORMAL HIGH (ref 212.0–360.0)

## 2022-09-02 ENCOUNTER — Other Ambulatory Visit: Payer: Self-pay | Admitting: *Deleted

## 2022-09-02 DIAGNOSIS — D509 Iron deficiency anemia, unspecified: Secondary | ICD-10-CM

## 2022-09-09 ENCOUNTER — Encounter: Payer: PPO | Attending: Psychology

## 2022-09-09 DIAGNOSIS — F419 Anxiety disorder, unspecified: Secondary | ICD-10-CM | POA: Diagnosis not present

## 2022-09-09 DIAGNOSIS — F329 Major depressive disorder, single episode, unspecified: Secondary | ICD-10-CM | POA: Insufficient documentation

## 2022-09-09 DIAGNOSIS — R4189 Other symptoms and signs involving cognitive functions and awareness: Secondary | ICD-10-CM | POA: Insufficient documentation

## 2022-09-09 DIAGNOSIS — R4789 Other speech disturbances: Secondary | ICD-10-CM | POA: Insufficient documentation

## 2022-09-09 NOTE — Progress Notes (Signed)
Behavioral Observation:  The patient appeared well-groomed and appropriately dressed. Her manners were polite and appropriate to the situation. The patient's attitude towards testing was excellent and her effort was good.   Neuropsychology Note  Cassandra [name] completed 95 minutes of neuropsychological testing with technician, Cassandra [name], BA, under the supervision of Cassandra Phenix, PsyD., Clinical Neuropsychologist. The patient did not appear overtly distressed by the testing session, per behavioral observation or via self-report to the technician. Rest breaks were offered.   Clinical Decision Making: In considering the patient's current level of functioning, level of presumed impairment, nature of symptoms, emotional and behavioral responses during clinical interview, level of literacy, and observed level of motivation/effort, a battery of tests was selected by Cassandra [name] during initial consultation on 08/05/2022. This was communicated to the technician. Communication between the neuropsychologist and technician was ongoing throughout the testing session and changes were made as deemed necessary based on patient performance on testing, technician observations and additional pertinent factors such as those listed above.  Tests Administered: Wechsler Adult Intelligence Scale, 4th Edition (WAIS-IV) Wechsler Memory Scale, 4th Edition (WMS-IV); Older Adult Battery   Results:  WAIS-IV: Composite Score Summary  Scale Sum of Scaled Scores Composite Score Percentile Rank 95% Conf. Interval Qualitative Description  Verbal Comprehension 27 VCI 95 37 90-101 Average  Perceptual Reasoning 29 PRI 98 45 92-104 Average  Working Memory 19 WMI 97 42 90-104 Average  Processing Speed 28 PSI 122 93 111-128 Superior  Full Scale 103 FSIQ 102 55 98-106 Average  General Ability 56 GAI 96 39 91-101 Average   Verbal Comprehension Subtests Summary  Subtest Raw Score Scaled Score Percentile Rank  Reference Group Scaled Score SEM  Similarities 17 8 25 6  1.12  Vocabulary 37 10 50 10 0.73  Information 11 9 37 9 0.73   Perceptual Reasoning Subtests Summary  Subtest Raw Score Scaled Score Percentile Rank Reference Group Scaled Score SEM  Block Design 29 10 50 7 1.27  Matrix Reasoning 12 11 63 6 0.73  Visual Puzzles 8 8 25 6  0.99   Working Librarian, academic Raw Score Scaled Score Percentile Rank Reference Group Scaled Score SEM  Digit Span 21 9 37 6 0.79  Arithmetic 12 10 50 9 0.95   Processing Speed Subtests Summary  Subtest Raw Score Scaled Score Percentile Rank Reference Group Scaled Score SEM  Symbol Search 35 16 98 10 1.12  Coding 55 12 75 7 1.12    WMS-IV:  Index Score Summary  Index Sum of Scaled Scores Index Score Percentile Rank 95% Confidence Interval Qualitative Descriptor  Auditory Memory (AMI) 44 105 63 99-111 Average  Visual Memory (VMI) 23 108 70 103-113 Average  Immediate Memory (IMI) 33 106 66 99-112 Average  Delayed Memory (DMI) 34 108 70 100-115 Average    Primary Subtest Scaled Score Summary  Subtest Domain Raw Score Scaled Score Percentile Rank  Logical Memory I AM 29 10 50  Logical Memory II AM 15 10 50  Verbal Paired Associates I AM 22 11 63  Verbal [employer] II AM 8 13 84  Visual Reproduction I VM 33 12 75  Visual Reproduction II VM 19 11 63  Symbol Span VWM 11 8 25    Auditory Memory Process Score Summary  Process Score Raw Score Scaled Score Percentile Rank Cumulative Percentage (Base Rate)  LM II Recognition 14 - - 10-16%  VPA II Recognition 29 - - >75%   Visual Memory Process Score Summary  Process Score Raw Score Scaled Score Percentile Rank Cumulative Percentage (Base Rate)  VR II Recognition 5 - - 51-75%   ABILITY-MEMORY ANALYSIS  Ability Score:  VCI: 95 Date of Testing:  WAIS-IV; WMS-IV 2022/09/09  Predicted Difference Method   Index Predicted WMS-IV Index Score Actual WMS-IV Index Score  Difference Critical Value  Significant Difference Y/N Base Rate  Auditory Memory 97 105 -8 9.39 N   Visual Memory 98 108 -10 8.28 Y 25%  Immediate Memory 97 106 -9 10.49 N   Delayed Memory 97 108 -11 12.08 N   Statistical significance (critical value) at the .01 level.     Feedback to Patient: Cassandra [name] will return on 05/09/2023 for an interactive feedback session with Cassandra [name] at which time her test performances, clinical impressions and treatment recommendations will be reviewed in detail. The patient understands she can contact our office should she require our assistance before this time.  95 minutes spent face-to-face with patient administering standardized tests, 30 minutes spent scoring Radiographer, therapeutic). [CPT [id number], [id number]]  Full report to follow.

## 2022-09-13 NOTE — Progress Notes (Unsigned)
[facility] [address] [place] [address] Phone: [phone] Subjective:   INadine Counts, am serving as a scribe for Dr. Antoine Primas.  I'm seeing this patient by the request  of:  Philip Aspen, Limmie Patricia, MD  CC:   [id number]  07/13/2022 Patient continues to have tenderness in the right upper quadrant.  Discussed with patient that I do feel at this point a HIDA scan could be beneficial.  Just to make sure there is nothing else that is potentially contributing to some of his discomfort of this and that could be causing or associated with some of her back pain.  Discussed icing regimen and home exercises otherwise.  Encouraged her to follow-up with gastroenterology as well if this continues and if significant worsening pain to seek medical attention immediately.   Chronic problem with worsening symptoms.  Patient given injection and tolerated the procedure well, discussed icing regimen and home exercises, discussed which activities to do and which ones to avoid.  We discussed that patient did respond well to viscosupplementation nearly a year ago.  Continue to wear the bracing.  Increase activity as tolerated.  Follow-up again in 6 to 8 weeks   Updated 09/14/2022 KEYLIN FERRYMAN is a 77 y.o. female coming in with complaint of B knee pain. Pain on the medial side. Can't walk for long distances because of knee pain. Maybe wants a nutritionist. Attempting to lose weight.       Past Medical History:  Diagnosis Date   Anemia    Anxiety    Asthma    Back pain    BPPV (benign paroxysmal positional vertigo)    Chronic allergic conjunctivitis    Depression    Diverticulosis    Fibromyalgia    Gallbladder problem    Gallstones    GERD (gastroesophageal reflux disease)    Hiatal hernia    History of bladder stone    Iron (Fe) deficiency anemia    Joint pain    Lump in female breast    Mild persistent asthma    followed by pcp--- dr Elvera Lennox.  sharma ([place] allergy/asthma)   OA (osteoarthritis)    knees, hands   OAB (overactive bladder)    OSA (obstructive sleep apnea)    per pt has not used cpap several years   Pancreatic cyst    PONV (postoperative nausea and vomiting)    Pre-diabetes    RLS (restless legs syndrome)    Seasonal and perennial allergic rhinitis    SUI (stress urinary incontinence, female)    Swallowing difficulty    Vitamin B12 deficiency    Vitamin D deficiency    Wears glasses    Past Surgical History:  Procedure Laterality Date   BLADDER SUSPENSION  1990's   sling   bladder tacking  2010   with mesh   BREAST LUMPECTOMY Left 1985   Benign cyst   BREAST REDUCTION SURGERY Bilateral 2003 approx.   CORNEA LESION EXCISION Right 2005  approx.   CYSTOSCOPY N/A 07/12/2016   Procedure: CYSTOSCOPY, VAGINOSCOPY, EXAM UNDER ANESTHESIA, STONE LITHOTRIPSY,;  Surgeon: Malen Gauze, MD;  Location[facility];  Service: Urology;  Laterality: N/A;   CYSTOSCOPY N/A 10/11/2016   Procedure: CYSTOSCOPY;  Surgeon: Malen Gauze, MD;  Location: [facility];  Service: Urology;  Laterality: N/A;   CYSTOSCOPY WITH LITHOLAPAXY N/A 10/11/2016   Procedure: CYSTOSCOPY WITH LITHOLAPAXY/ EXCISION OF MESH;  Surgeon: Malen Gauze, MD;  Location: [facility];  Service: Urology;  Laterality: N/A;   CYSTOSCOPY WITH LITHOLAPAXY N/A 01/22/2019   Procedure: CYSTOSCOPY WITH LITHOLAPAXY;  Surgeon: Malen Gauze, MD;  Location: [facility];  Service: Urology;  Laterality: N/A;  1 HR   CYSTOSCOPY WITH LITHOLAPAXY N/A 08/09/2022   Procedure: CYSTOLITHOLAPAXY;  Surgeon: Jannifer Hick, MD;  Location: [facility];  Service: Urology;  Laterality: N/A;  45 MINUTES NEEDED FOR CASE   HOLMIUM LASER APPLICATION  07/12/2016   Procedure: HOLMIUM LASER APPLICATION;  Surgeon: Malen Gauze, MD;  Location: [facility];  Service: Urology;;    HOLMIUM LASER APPLICATION N/A 01/22/2019   Procedure: HOLMIUM LASER APPLICATION;  Surgeon: Malen Gauze, MD;  Location: [facility];  Service: Urology;  Laterality: N/A;   RIGHT KNEE ARTHROSCOPY  2013   TOTAL KNEE ARTHROPLASTY Right 09/12/2012   Procedure: RIGHT TOTAL KNEE ARTHROPLASTY;  Surgeon: Loanne Drilling, MD;  Location: [facility];  Service: Orthopedics;  Laterality: Right;   VAGINAL HYSTERECTOMY  1984   Social History   Socioeconomic History   Marital status: Widowed    Spouse name: Not on file   Number of children: 2   Years of education: Not on file   Highest education level: Bachelor's degree (e.g., BA, AB, BS)  Occupational History   Occupation: Engineer, technical sales Retired  Tobacco Use   Smoking status: Never   Smokeless tobacco: Never  Vaping Use   Vaping Use: Never used  Substance and Sexual Activity   Alcohol use: Yes    Comment: 1x monthly   Drug use: Never   Sexual activity: Not on file  Other Topics Concern   Not on file  Social History Narrative   Lives alone   R handed   Caffeine: 1 C a day   Social Determinants of Health   Financial Resource Strain: Low Risk  (08/19/2022)   Overall Financial Resource Strain (CARDIA)    Difficulty of Paying Living Expenses: Not hard at all  Food Insecurity: No Food Insecurity (08/19/2022)   Hunger Vital Sign    Worried About Running Out of Food in the Last Year: Never true    Ran Out of Food in the Last Year: Never true  Transportation Needs: No Transportation Needs (08/19/2022)   PRAPARE - Administrator, Civil Service (Medical): No    Lack of Transportation (Non-Medical): No  Physical Activity: Sufficiently Active (08/19/2022)   Exercise Vital Sign    Days of Exercise per Week: 7 days    Minutes of Exercise per Session: 40 min  Stress: No Stress Concern Present (08/19/2022)   [employer] of Occupational Health - Occupational Stress Questionnaire    Feeling of Stress : Not  at all  Social Connections: Moderately Integrated (08/19/2022)   Social Connection and Isolation Panel [NHANES]    Frequency of Communication with Friends and Family: More than three times a week    Frequency of Social Gatherings with Friends and Family: Twice a week    Attends Religious Services: More than 4 times per year    Active Member of [employer] or Organizations: Yes    Attends Banker Meetings: More than 4 times per year    Marital Status: Widowed   Allergies  Allergen Reactions   Cephalosporins Hives   Chlorhexidine Hives   Codeine     Other reaction(s): Unknown   Hydrochlorothiazide Hives   Misc. Sulfonamide Containing Compounds Hives  Penicillins Hives   Sulfa Antibiotics Hives   Sulfacetamide Sodium     Other reaction(s): Unknown   Family History  Problem Relation Age of Onset   Kidney cancer Mother    Diabetes Mother    Obesity Mother    Dementia Mother        died age 11   Other Father        burn victum   Alcoholism Father    Colon cancer Neg Hx    Inflammatory bowel disease Neg Hx    Liver disease Neg Hx    Pancreatic cancer Neg Hx    Stomach cancer Neg Hx    Rectal cancer Neg Hx     Current Outpatient Medications (Endocrine & Metabolic):    metFORMIN (GLUCOPHAGE) 500 MG tablet, Take 1 tablet (500 mg total) by mouth 2 (two) times daily with a meal. TAKE 1 TABLET BY MOUTH EVERY DAY WITH BREAKFAST  Current Outpatient Medications (Cardiovascular):    EPINEPHrine 0.3 mg/0.3 mL IJ SOAJ injection, Inject 0.3 mg into the muscle as needed for anaphylaxis.   spironolactone (ALDACTONE) 25 MG tablet, TAKE 1 TABLET (25 MG TOTAL) BY MOUTH DAILY.  Current Outpatient Medications (Respiratory):    fluticasone (FLONASE) 50 MCG/ACT nasal spray, Place into both nostrils.   levocetirizine (XYZAL) 5 MG tablet, Take 5 mg by mouth every evening.     Current Outpatient Medications (Other):    gabapentin (NEURONTIN) 300 MG capsule, Take 1 capsule (300 mg total)  by mouth at bedtime.   escitalopram (LEXAPRO) 10 MG tablet, Take 1 tablet (10 mg total) by mouth daily with breakfast.   gabapentin (NEURONTIN) 100 MG capsule, TAKE 2 CAPSULES BY MOUTH AT BEDTIME. (Patient taking differently: Take 300 mg by mouth at bedtime.)   Misc Natural Products (TART CHERRY ADVANCED) CAPS, Take 1 capsule by mouth.  capsule   omeprazole (PRILOSEC) 20 MG capsule, Take 20 mg by mouth 2 (two) times daily before a meal.   pramipexole (MIRAPEX) 0.25 MG tablet, TAKE 1 TABLET AT NIGHT AS NEEDED FOR RESTLESS LEG SYNDROME   psyllium (REGULOID) 0.52 g capsule, Take 0.52 g by mouth daily.   traZODone (DESYREL) 50 MG tablet, TAKE 1 TABLET BY MOUTH AT BEDTIME AS NEEDED FOR SLEEP.   Reviewed prior external information including notes and imaging from  primary care provider As well as notes that were available from care everywhere and other healthcare systems.  Past medical history, social, surgical and family history all reviewed in electronic medical record.  No pertanent information unless stated regarding to the chief complaint.   Review of Systems:  No headache, visual changes, nausea, vomiting, diarrhea, constipation, dizziness, abdominal pain, skin rash, fevers, chills, night sweats, weight loss, swollen lymph nodes, joint swelling, chest pain, shortness of breath, mood changes. POSITIVE muscle aches, body aches  Objective  Blood pressure 100/68, pulse 88, height  (1.676 m), weight 204 lb (92.5 kg), SpO2 97 %.   General: No apparent distress alert and oriented x3 mood and affect normal, dressed appropriately.  He does appear to be somewhat anxious HEENT: Pupils equal, extraocular movements intact  Respiratory: Patient's speak in full sentences and does not appear short of breath  Cardiovascular: No lower extremity edema, non tender, no erythema  Patient's knees do have some crepitus noted still. Left knee worse than the right.  The patient does have some instability  noted.  Pain to palpation in multiple different areas.    Impression and Recommendations:  The above documentation has been reviewed and is accurate and complete Lyndal Pulley, DO

## 2022-09-14 ENCOUNTER — Ambulatory Visit (INDEPENDENT_AMBULATORY_CARE_PROVIDER_SITE_OTHER): Payer: PPO | Admitting: Family Medicine

## 2022-09-14 ENCOUNTER — Ambulatory Visit: Payer: PPO | Admitting: Family Medicine

## 2022-09-14 VITALS — BP 100/68 | HR 88 | Ht 66.0 in | Wt 204.0 lb

## 2022-09-14 DIAGNOSIS — M545 Low back pain, unspecified: Secondary | ICD-10-CM | POA: Diagnosis not present

## 2022-09-14 DIAGNOSIS — G8929 Other chronic pain: Secondary | ICD-10-CM | POA: Diagnosis not present

## 2022-09-14 DIAGNOSIS — D509 Iron deficiency anemia, unspecified: Secondary | ICD-10-CM | POA: Diagnosis not present

## 2022-09-14 DIAGNOSIS — M2550 Pain in unspecified joint: Secondary | ICD-10-CM

## 2022-09-14 MED ORDER — GABAPENTIN 300 MG PO CAPS: 300.0000 mg | ORAL_CAPSULE | Freq: Every day | ORAL | 0 refills | Status: DC

## 2022-09-14 NOTE — Assessment & Plan Note (Addendum)
Patient does have iron deficiency noted.  Quite severe overall.  Encourage patient to follow-up with the gastroenterologist as soon as possible.  Do feel that this is contributing to some of the increasing muscle aches and pains that patient is having.  Total time discussing the vitamin supplementations with vitamin C and the iron supplementation as well as why it is important for her to see gastroenterology 31 minutes.  No abnormal weight loss though noted.

## 2022-09-14 NOTE — Patient Instructions (Addendum)
Gabapentin 300 mg Take the Vit C, this should help a lot Recheck iron in 2 months, have recheck done before we meet again See you again in 2 months

## 2022-09-14 NOTE — Assessment & Plan Note (Signed)
Increase gabapentin to 300 mg at night worsening pain, need to consider the possibility of advanced imaging if having worsening discomfort.

## 2022-09-14 NOTE — Assessment & Plan Note (Signed)
Underlying fibromyalgia likely also contributing.  May need to consider the possibility of treating with different medications but at the moment patient would like to avoid.  Do feel that there is some underlying anxiety that could be potentially playing a role as well.

## 2022-09-16 DIAGNOSIS — L821 Other seborrheic keratosis: Secondary | ICD-10-CM | POA: Diagnosis not present

## 2022-09-16 DIAGNOSIS — D2372 Other benign neoplasm of skin of left lower limb, including hip: Secondary | ICD-10-CM | POA: Diagnosis not present

## 2022-09-16 DIAGNOSIS — R208 Other disturbances of skin sensation: Secondary | ICD-10-CM | POA: Diagnosis not present

## 2022-09-16 DIAGNOSIS — D2262 Melanocytic nevi of left upper limb, including shoulder: Secondary | ICD-10-CM | POA: Diagnosis not present

## 2022-09-16 DIAGNOSIS — L570 Actinic keratosis: Secondary | ICD-10-CM | POA: Diagnosis not present

## 2022-09-16 DIAGNOSIS — D2261 Melanocytic nevi of right upper limb, including shoulder: Secondary | ICD-10-CM | POA: Diagnosis not present

## 2022-09-16 DIAGNOSIS — D220 Melanocytic nevi of lip: Secondary | ICD-10-CM | POA: Diagnosis not present

## 2022-09-16 DIAGNOSIS — D0472 Carcinoma in situ of skin of left lower limb, including hip: Secondary | ICD-10-CM | POA: Diagnosis not present

## 2022-09-16 DIAGNOSIS — D225 Melanocytic nevi of trunk: Secondary | ICD-10-CM | POA: Diagnosis not present

## 2022-09-16 DIAGNOSIS — Z85828 Personal history of other malignant neoplasm of skin: Secondary | ICD-10-CM | POA: Diagnosis not present

## 2022-09-16 DIAGNOSIS — D1801 Hemangioma of skin and subcutaneous tissue: Secondary | ICD-10-CM | POA: Diagnosis not present

## 2022-09-16 DIAGNOSIS — D2239 Melanocytic nevi of other parts of face: Secondary | ICD-10-CM | POA: Diagnosis not present

## 2022-09-17 ENCOUNTER — Encounter: Payer: Self-pay | Admitting: Internal Medicine

## 2022-09-22 DIAGNOSIS — R3915 Urgency of urination: Secondary | ICD-10-CM | POA: Diagnosis not present

## 2022-09-22 DIAGNOSIS — N210 Calculus in bladder: Secondary | ICD-10-CM | POA: Diagnosis not present

## 2022-09-22 DIAGNOSIS — R3914 Feeling of incomplete bladder emptying: Secondary | ICD-10-CM | POA: Diagnosis not present

## 2022-09-23 DIAGNOSIS — D0472 Carcinoma in situ of skin of left lower limb, including hip: Secondary | ICD-10-CM | POA: Diagnosis not present

## 2022-09-27 ENCOUNTER — Other Ambulatory Visit: Payer: Self-pay | Admitting: Internal Medicine

## 2022-09-27 NOTE — Progress Notes (Unsigned)
TeleHealth Visit:  This visit was completed with telemedicine (audio/video) technology. Cassandra [name] has verbally consented to this TeleHealth visit. The patient is located at home, the provider is located at home. The participants in this visit include the listed provider and patient. The visit was conducted today via MyChart video.  OBESITY Cassandra [name] is here to discuss her progress with her obesity treatment plan along with follow-up of her obesity related diagnoses.   Today's visit was # 33 Starting weight: 193 lbs Starting date: 11/13/19 Weight at last in office visit: 200 lbs on 08/24/22 Total weight loss: 0 lbs at last in office visit on 08/24/22. Today's reported weight (09/28/22):  203 lbs  Nutrition Plan: the Category 2 plan - 40-50% adherence.  Current exercise:  walks some but has knee pain.  Interim History:  She has a habit of skipping meals. Skips breakfast and supper. She then tends to snack on ginger snaps or other carbohydrates. She loves bread.  She stuck to cat 2 when she first started but now only 40-50%. She is inconsistent with following the plan.  Protein intake is low. She know she has not applied herself as she should have to adhering to the plan.  Drinks mostly water.  She cites health issues and cortisone shots for her knees as derailing her efforts for weight loss.   She wants to take a break from the program. Advised that she can come back without restarting the program within 6 months.  Assessment/Plan:  1. Prediabetes Last A1c was 6.3 on 08/19/2022. Medication(s): Metformin 500 mg twice daily with meals Lab Results  Component Value Date   HGBA1C 6.3 08/19/2022   HGBA1C 5.9 (H) 05/11/2022   HGBA1C 5.7 (H) 01/13/2022   HGBA1C 5.6 04/17/2020   HGBA1C 5.7 (H) 11/13/2019   Lab Results  Component Value Date   INSULIN 22.3 05/11/2022   INSULIN 20.0 01/13/2022   INSULIN 24.4 07/21/2021   INSULIN 19.1 11/26/2020   INSULIN 15.9 04/17/2020     Plan: Continue and refill Metformin 500 mg twice daily with meals   2. Generalized Obesity: Current BMI 32  Cassandra [name] is currently in the action stage of change. As such, her goal is to continue with weight loss efforts.  She has agreed to practicing portion control and making smarter food choices, such as increasing vegetables and decreasing simple carbohydrates.  1.  Encouraged her to have at least 2 meals per day and 1 protein shake. 2.  Encouraged intake of fruits, vegetables, and protein.  Exercise goals: All adults should avoid inactivity. Some physical activity is better than none, and adults who participate in any amount of physical activity gain some health benefits.  Behavioral modification strategies: increasing lean protein intake, decreasing simple carbohydrates , and meal planning .  No follow-up scheduled.  She will take hiatus from the program.  No orders of the defined types were placed in this encounter.   Medications Discontinued During This Encounter  Medication Reason   metFORMIN (GLUCOPHAGE) 500 MG tablet Reorder     Meds ordered this encounter  Medications   metFORMIN (GLUCOPHAGE) 500 MG tablet    Sig: Take 1 tablet (500 mg total) by mouth 2 (two) times daily with a meal. TAKE 1 TABLET BY MOUTH EVERY DAY WITH BREAKFAST    Dispense:  180 tablet    Refill:  0    Order Specific Question:   Supervising Provider    Answer:   Carolin Sicks      Objective:  VITALS: Per patient if applicable, see vitals. GENERAL: Alert and in no acute distress. CARDIOPULMONARY: No increased WOB. Speaking in clear sentences.  PSYCH: Pleasant and cooperative. Speech normal rate and rhythm. Affect is appropriate. Insight and judgement are appropriate. Attention is focused, linear, and appropriate.  NEURO: Oriented as arrived to appointment on time with no prompting.   Attestation Statements:   Reviewed by clinician on day of visit: allergies, medications, problem  list, medical history, surgical history, family history, social history, and previous encounter notes.  Time spent on visit including the items listed below was 30 minutes.  -preparing to see the patient (e.g., review of tests, history, previous notes) -obtaining and/or reviewing separately obtained history -counseling and educating the patient/family/caregiver -documenting clinical information in the electronic or other health record -ordering medications, tests, or procedures -independently interpreting results and communicating results to the patient/ family/caregiver -referring and communicating with other health care professionals  -care coordination   This was prepared with the assistance of Engineer, civil (consulting).  Occasional wrong-word or sound-a-like substitutions may have occurred due to the inherent limitations of voice recognition software.

## 2022-09-28 ENCOUNTER — Ambulatory Visit (INDEPENDENT_AMBULATORY_CARE_PROVIDER_SITE_OTHER): Payer: PPO | Admitting: Family Medicine

## 2022-09-28 ENCOUNTER — Telehealth (INDEPENDENT_AMBULATORY_CARE_PROVIDER_SITE_OTHER): Payer: PPO | Admitting: Family Medicine

## 2022-09-28 ENCOUNTER — Encounter (INDEPENDENT_AMBULATORY_CARE_PROVIDER_SITE_OTHER): Payer: Self-pay | Admitting: Family Medicine

## 2022-09-28 ENCOUNTER — Telehealth (INDEPENDENT_AMBULATORY_CARE_PROVIDER_SITE_OTHER): Payer: Self-pay | Admitting: Family Medicine

## 2022-09-28 ENCOUNTER — Other Ambulatory Visit (INDEPENDENT_AMBULATORY_CARE_PROVIDER_SITE_OTHER): Payer: Self-pay | Admitting: Family Medicine

## 2022-09-28 DIAGNOSIS — E669 Obesity, unspecified: Secondary | ICD-10-CM | POA: Diagnosis not present

## 2022-09-28 DIAGNOSIS — R7303 Prediabetes: Secondary | ICD-10-CM | POA: Diagnosis not present

## 2022-09-28 DIAGNOSIS — Z6832 Body mass index (BMI) 32.0-32.9, adult: Secondary | ICD-10-CM

## 2022-09-28 MED ORDER — METFORMIN HCL 500 MG PO TABS
500.0000 mg | ORAL_TABLET | Freq: Two times a day (BID) | ORAL | 0 refills | Status: DC
Start: 2022-09-28 — End: 2023-01-10

## 2022-09-28 MED ORDER — METFORMIN HCL 500 MG PO TABS: 500.0000 mg | ORAL_TABLET | Freq: Two times a day (BID) | ORAL | 0 refills | Status: DC

## 2022-09-28 NOTE — Telephone Encounter (Signed)
Patient Pharmacy [facility] is requesting we send the Metformin directions to them. The correct directions is "Take 1 tablet (500 mg total) by mouth 2 (two) times daily with a meal."

## 2022-09-28 NOTE — Telephone Encounter (Signed)
Sent in

## 2022-10-26 ENCOUNTER — Encounter: Payer: Self-pay | Admitting: Psychology

## 2022-10-26 NOTE — Progress Notes (Signed)
Neuropsychological Consultation   Patient:   Cassandra [name]   DOB:   01-16-1946  MR Number:  [id number]  Location:  [facility] FOR PAIN AND [facility] MEDICINE [facility] PHYSICAL MEDICINE & REHABILITATION [address] [place], [place] [address] [id number] [facility] [place] [address] Dept: [phone]           Date of Service:   08/05/2022  Location of Service and Individuals present: Today's visit was conducted in my outpatient clinic office with the patient myself present.  Start Time:   1 PM End Time:   3 PM  Patient Consent and Confidentiality: Review of limits of confidentiality were conducted with the patient understanding she was referred for neuropsychological evaluation by her treating neurologist Melvyn Novas, MD.  Consent for Evaluation and Treatment:  Signed:  Yes Explanation of Privacy Policies:  Signed:  Yes Discussion of Confidentiality Limits:  Yes  Provider/Observer:  Arley Phenix, Psy.D.       Clinical Neuropsychologist       Billing Code/Service: 96116/96121  Chief Complaint:     Chief Complaint  Patient presents with   Anxiety   Hearing Loss   Memory Loss   Other    Word finding difficulties    Reason for Service:    Cassandra [name] is a 77 year old female referred for neuropsychological evaluation by her treating neurologist Melvyn Novas, MD due to reports of increasing memory difficulties and word finding difficulties.  Patient has had a history of significant insomnia with various medication attempts.  She was diagnosed with obstructive sleep apnea back around 2009 but struggled with CPAP machine.  She had a more recent sleep study conducted by Dr. Vickey Huger where there was no observed evidence of active obstructive sleep apnea on sleep study.  Patient has a history of hyperactivity, impulsivity and anxiety which her neurologist felt were likely playing an interaction with her insomnia both contributing to insomnia and insomnia contributing to  these other attentional and anxiety type symptoms.  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  Patient has also been diagnosed with restless leg syndrome, insomnia, hyperlipidemia.  Anxiety and depressive symptoms are noted as well.  The patient reports that she is having more memory issues and difficulty keeping up with daily objects.  Patient reports that she is not particularly distressed by this and feels like losing things is just a normal event.  The patient reports that she had had 2 previous sleep studies with most recent not identified obstructive sleep apnea.  Patient reports that she has a nearly lifelong history of difficulty going to sleep and staying asleep.  She describes significant vertigo and dizziness, hearing loss, anxiety and restless leg syndrome.  She describes significant balance issues and nausea.  Patient reports that she wakes up every hour and has difficulty going back to sleep.  Patient reports significant change in her appetite and that she essentially has "no desire to eat" and has had metabolic imbalance around glucose and insulin response.  Patient reports that her mother developed significant memory deficits around 68 to 76 years of age and was diagnosed with dementia.  She is unsure if there was ever a more definitive description of her mother's dementia.  Medical History:   Past Medical History:  Diagnosis Date   Anemia    Anxiety    Asthma  Back pain    BPPV (benign paroxysmal positional vertigo)    Chronic allergic conjunctivitis    Depression    Diverticulosis    Fibromyalgia    Gallbladder problem    Gallstones    GERD (gastroesophageal reflux disease)    Hiatal hernia    History of bladder stone    Iron (Fe) deficiency  anemia    Joint pain    Lump in female breast    Mild persistent asthma    followed by pcp--- dr r. sharma ([place] allergy/asthma)   OA (osteoarthritis)    knees, hands   OAB (overactive bladder)    OSA (obstructive sleep apnea)    per pt has not used cpap several years   Pancreatic cyst    PONV (postoperative nausea and vomiting)    Pre-diabetes    RLS (restless legs syndrome)    Seasonal and perennial allergic rhinitis    SUI (stress urinary incontinence, female)    Swallowing difficulty    Vitamin B12 deficiency    Vitamin D deficiency    Wears glasses          Patient Active Problem List   Diagnosis Date Noted   Microcytic anemia 08/31/2022   Arterial hypotension 08/24/2022   BMI 33.0-33.9,adult 06/22/2022   AC (acromioclavicular) arthritis 06/02/2022   Acquired trigger finger of left ring finger 06/02/2022   Vitamin D deficiency 01/13/2022   Other fatigue 12/15/2021   Psychophysiological insomnia 12/15/2021   Inadequate sleep hygiene 12/15/2021   History of sleep apnea 12/15/2021   Anxiety 12/15/2021   Class 1 obesity due to excess calories without serious comorbidity with body mass index (BMI) of 33.0 to 33.9 in adult 12/15/2021   Insulin resistance 10/20/2021   Hyperlipidemia 10/20/2021   Depression 10/20/2021   Pancreatic lesion 01/30/2021   Cholelithiasis 01/30/2021   RUQ pain 01/30/2021   LUQ pain 09/15/2020   Calculus of gallbladder without cholecystitis without obstruction 09/15/2020   Hamstring tendinitis 09/10/2020   Pyrosis 07/21/2020   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   Right hip pain 06/12/2020   Pre-diabetes 03/12/2020   Seasonal allergies 03/12/2020   Generalized obesity 10/30/2019   Low back pain 09/11/2019   Degenerative joint disease of knee, left 09/11/2019   Pancreatic cyst 01/25/2019   Gastroesophageal reflux disease 01/25/2019   Esophageal dysphagia 01/25/2019   Unintentional weight loss 01/25/2019   IGT  (impaired glucose tolerance) 01/17/2019   BPPV (benign paroxysmal positional vertigo) 10/23/2012   Vertigo 10/21/2012   UTI (lower urinary tract infection) 10/21/2012   Nausea & vomiting 10/21/2012   Postoperative anemia due to acute blood loss 09/14/2012   OA (osteoarthritis) of knee 09/12/2012   Insomnia 08/20/2011   OBSTRUCTIVE SLEEP APNEA 04/23/2008   RESTLESS LEG SYNDROME 04/23/2008   FIBROMYALGIA 01/06/2007   Progression of Symptoms: Patient reports that she has experienced a worsening of her cognitive efficiency but tends to downplay it significant impact on her life.  Additional Tests and Measures from other records:  Neuroimaging Results: The only neuroimaging studies I was able to find in her EMR were an MRI head without contrast performed on 10/23/2012.  This was requested due to severe vertigo and concerned about posterior circulation stroke/issues.  The interpretation of the MRI was one of the normal MRI of the brain for age with a few white matter foci not likely of clinical significance and no evidence of prior posterior circulation insult/stroke.  The few small foci of T2 and FLAIR signal were noted  in frontal white matter regions and not felt to be clinically significant.  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  Patient reports that she wakes up almost every hour and has difficulty going back to sleep.  Patient was diagnosed with obstructive sleep apnea back in 2009 but most recent sleep study found no indications of obstructive sleep apnea.  The patient is on no CPAP assistive devices.  Behavioral Observation/Mental Status:   PHILAMENA GERBERS  presents as a 77 y.o.-year-old Right handed Caucasian Female who appeared her stated age. her dress was Appropriate and she was Well Groomed and her manners were Appropriate but quite talkative to the situation.  her participation was indicative  of Appropriate, Redirectable, and Sharing behaviors.  There were not physical disabilities noted.  she displayed an appropriate level of cooperation and motivation.    Interactions:    Active Redirectable and talkative  Attention:   within normal limits and attention span and concentration were age appropriate  Memory:   within normal limits; recent and remote memory intact  Visuo-spatial:   not examined  Speech (Volume):  normal  Speech:   normal; rapid  Thought Process:  Coherent and Relevant  Coherent and Logical  Though Content:  WNL; not suicidal and not homicidal  Orientation:   person, place, time/date, and situation  Judgment:   Good  Planning:   Fair  Affect:    Anxious  Mood:    Anxious  Insight:   Good  Intelligence:   normal  Marital Status/Living:  The patient was born and raised in [place] along with 2 siblings.  Normal childhood illnesses were noted.  The patient currently lives alone and has been on living alone for the past 12 years.  The patient is widowed and was married for 43 years with no previous marriage.  The patient is a 45 year old son and a 32 year old daughter without significant medical issues.  Educational and Occupational History:     Highest Level of Education: The patient graduated from college with her undergraduate degree maintaining a 3.5 GPA.  She graduated from [employer] and also attended [employer].  The patient reports that she always did well in English classes and had some relative difficulty in math.  Extracurricular activities included tumbling and gymnastics as well as softball.  The patient completed her associates degree in business, and also classes and public speaking, Spanish class and computer classes.  Current Occupation:    Patient is retired.  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  Hobbies and Interests: The patient's hobbies and interests include reading, writing, doing yard work, flowers, taking exercise classes and swim classes, ceramics and walking.  Psychiatric History:  The patient does have a history of coping with mild to moderate depression and anxiety symptoms.  She continues to take Lexapro for the symptoms and also takes trazodone at night to aid with sleep.  History of Substance Use or Abuse:  No concerns of substance abuse are reported.  Family Med/Psych History:  Family History  Problem Relation Age of Onset   Kidney cancer Mother    Diabetes Mother    Obesity Mother    Dementia Mother        died age 108   Other Father  burn victum   Alcoholism Father    Colon cancer Neg Hx    Inflammatory bowel disease Neg Hx    Liver disease Neg Hx    Pancreatic cancer Neg Hx    Stomach cancer Neg Hx    Rectal cancer Neg Hx     Impression/DX:   VYLA CONNLEY is a 77 year old female referred for neuropsychological evaluation by her treating neurologist Melvyn Novas, MD due to reports of increasing memory difficulties and word finding difficulties.  Patient has had a history of significant insomnia with various medication attempts.  She was diagnosed with obstructive sleep apnea back around 2009 but struggled with CPAP machine.  She had a more recent sleep study conducted by Dr. Vickey Huger where there was no observed evidence of active obstructive sleep apnea on sleep study.  Patient has a history of hyperactivity, impulsivity and anxiety which her neurologist felt were likely playing an interaction with her insomnia both contributing to insomnia and insomnia contributing to these other attentional and anxiety type symptoms.  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  Patient has also been diagnosed with restless leg syndrome, insomnia, hyperlipidemia.  Anxiety and depressive symptoms are noted as well.  Disposition/Plan:  We have set the patient up for formal neuropsychological evaluation primarily to get a baseline regarding cognitive functioning and she will complete the Wechsler Adult Intelligence Scale and Wechsler Memory Scale's.  Once these are completed a determination will be made as to any need for further neuropsychological assessment.  Diagnosis:    Subjective memory complaints  Word finding difficulty  Major depressive disorder, remission status unspecified, unspecified whether recurrent  Anxiety        Note: This document was prepared using Dragon voice recognition software and may include unintentional dictation errors.   Electronically Signed   _______________________ Arley Phenix, Psy.D. Clinical Neuropsychologist

## 2022-11-02 ENCOUNTER — Encounter: Payer: PPO | Attending: Psychology | Admitting: Psychology

## 2022-11-02 ENCOUNTER — Encounter: Payer: Self-pay | Admitting: Psychology

## 2022-11-02 DIAGNOSIS — F329 Major depressive disorder, single episode, unspecified: Secondary | ICD-10-CM | POA: Diagnosis not present

## 2022-11-02 DIAGNOSIS — R7303 Prediabetes: Secondary | ICD-10-CM | POA: Diagnosis not present

## 2022-11-02 DIAGNOSIS — G2581 Restless legs syndrome: Secondary | ICD-10-CM | POA: Insufficient documentation

## 2022-11-02 DIAGNOSIS — F5101 Primary insomnia: Secondary | ICD-10-CM | POA: Insufficient documentation

## 2022-11-02 DIAGNOSIS — R4189 Other symptoms and signs involving cognitive functions and awareness: Secondary | ICD-10-CM | POA: Insufficient documentation

## 2022-11-02 DIAGNOSIS — F419 Anxiety disorder, unspecified: Secondary | ICD-10-CM | POA: Diagnosis not present

## 2022-11-02 NOTE — Progress Notes (Signed)
Neuropsychological Evaluation   Patient:  Cassandra [name]   DOB: May 12, 1946  MR Number: [id number]  Location: [facility] FOR PAIN AND [facility]IC[facility]ICAL [facility]ICINE & REHABILITATION [address] [place], [address] [id number] [facility] [place] [address] Dept: [phone]  Start: 8 AM End: 9 AM  Provider/Observer:     Hershal Coria PsyD  Chief Complaint:      Chief Complaint  Patient presents with   Memory Loss   Anxiety   Hearing Loss   Other    Word finding difficulty    Reason For Service:      Cassandra [name] is a 77 year old female referred for neuropsychological evaluation by her treating neurologist Melvyn Novas, MD, due to reports of increasing memory difficulties and word finding difficulties.  Patient has had a history of significant insomnia with various medication attempts.  She was diagnosed with obstructive sleep apnea back around 2009 but struggled with CPAP machine.  She had a more recent sleep study conducted by Dr. Vickey Huger, where there was no observed evidence of active obstructive sleep apnea on sleep study.  Patient has a history of hyperactivity, impulsivity and anxiety, which her neurologist felt were likely playing an interaction with her insomnia both contributing to insomnia and insomnia contributing to these other attentional and anxiety type symptoms.  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  Patient has also been diagnosed with restless leg syndrome, insomnia, hyperlipidemia.  Anxiety and depressive symptoms are noted as well.   The patient reports that she is having more memory issues and difficulty keeping up with daily objects.  Patient reports that she is not particularly distressed by this and feels  like losing things is just a normal event and common with age changes.   The patient reports that she had had 2 previous sleep studies with most recent not identifying obstructive sleep apnea.  Patient reports that she has a nearly lifelong history of difficulty going to sleep and staying asleep.  She describes significant vertigo and dizziness, hearing loss, anxiety and restless leg syndrome.  She describes significant balance issues and nausea.  Patient reports that she wakes up every hour and has difficulty going back to sleep.   Patient reports significant change in her appetite and that she essentially has "no desire to eat" and has had metabolic imbalance around glucose and insulin response.   Patient reports that her mother developed significant memory deficits around 84 to 77 years of age and was diagnosed with dementia.  She is unsure if there was ever a more definitive description of her mother's dementia.  The patient does have a history of coping with mild to moderate depression and anxiety symptoms. She continues to take Lexapro for the symptoms and also takes trazodone at night to aid with sleep.   Patient denies any visual or auditory hallucinations, tremors and denies any particular changes in geographic orientation or visual spatial changes.   Medical History:                         Past Medical History:  Diagnosis Date   Anemia     Anxiety     Asthma     Back pain     BPPV (benign paroxysmal positional vertigo)     Chronic allergic  conjunctivitis     Depression     Diverticulosis     Fibromyalgia     Gallbladder problem     Gallstones     GERD (gastroesophageal reflux disease)     Hiatal hernia     History of bladder stone     Iron (Fe) deficiency anemia     Joint pain     Lump in female breast     Mild persistent asthma      followed by pcp--- dr r. sharma ([place] allergy/asthma)   OA (osteoarthritis)      knees, hands   OAB (overactive bladder)     OSA  (obstructive sleep apnea)      per pt has not used cpap several years   Pancreatic cyst     PONV (postoperative nausea and vomiting)     Pre-diabetes     RLS (restless legs syndrome)     Seasonal and perennial allergic rhinitis     SUI (stress urinary incontinence, female)     Swallowing difficulty     Vitamin B12 deficiency     Vitamin D deficiency     Wears glasses                                                             Patient Active Problem List    Diagnosis Date Noted   Microcytic anemia 08/31/2022   Arterial hypotension 08/24/2022   BMI 33.0-33.9,adult 06/22/2022   AC (acromioclavicular) arthritis 06/02/2022   Acquired trigger finger of left ring finger 06/02/2022   Vitamin D deficiency 01/13/2022   Other fatigue 12/15/2021   Psychophysiological insomnia 12/15/2021   Inadequate sleep hygiene 12/15/2021   History of sleep apnea 12/15/2021   Anxiety 12/15/2021   Class 1 obesity due to excess calories without serious comorbidity with body mass index (BMI) of 33.0 to 33.9 in adult 12/15/2021   Insulin resistance 10/20/2021   Hyperlipidemia 10/20/2021   Depression 10/20/2021   Pancreatic lesion 01/30/2021   Cholelithiasis 01/30/2021   RUQ pain 01/30/2021   LUQ pain 09/15/2020   Calculus of gallbladder without cholecystitis without obstruction 09/15/2020   Hamstring tendinitis 09/10/2020   Pyrosis 07/21/2020   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   Right hip pain 06/12/2020   Pre-diabetes 03/12/2020   Seasonal allergies 03/12/2020   Generalized obesity 10/30/2019   Low back pain 09/11/2019   Degenerative joint disease of knee, left 09/11/2019   Pancreatic cyst 01/25/2019   Gastroesophageal reflux disease 01/25/2019   Esophageal dysphagia 01/25/2019   Unintentional weight loss 01/25/2019   IGT (impaired glucose tolerance) 01/17/2019   BPPV (benign paroxysmal positional vertigo) 10/23/2012   Vertigo 10/21/2012   UTI (lower urinary tract  infection) 10/21/2012   Nausea & vomiting 10/21/2012   Postoperative anemia due to acute blood loss 09/14/2012   OA (osteoarthritis) of knee 09/12/2012   Insomnia 08/20/2011   OBSTRUCTIVE SLEEP APNEA 04/23/2008   RESTLESS LEG SYNDROME 04/23/2008   FIBROMYALGIA 01/06/2007    Progression of Symptoms: Patient reports that she has experienced a worsening of her cognitive efficiency but tends to downplay it significant impact on her life.   Additional Tests and Measures from other records:   Neuroimaging Results: The only neuroimaging studies I was able to find in her EMR was an MRI  head without contrast performed on 10/23/2012.  This was requested due to severe vertigo and concerned about posterior circulation stroke/issues.  The interpretation of the MRI was one of the normal MRI of the brain for age with a few white matter foci not likely of clinical significance and no evidence of prior posterior circulation insult/stroke.  The few small foci of T2 and FLAIR signal were noted in frontal white matter regions and not felt to be clinically significant.   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  Patient reports that she wakes up almost every hour and has difficulty going back to sleep.  Patient was diagnosed with obstructive sleep apnea back in 2009 but most recent sleep study found no indications of obstructive sleep apnea.  The patient is on no CPAP assistive devices.  Tests Administered: Wechsler Adult Intelligence Scale, 4th Edition (WAIS-IV) Wechsler Memory Scale, 4th Edition (WMS-IV); Older Adult Battery   Participation Level:   Active  Participation Quality:  Appropriate      Behavioral Observation:  The patient appeared well-groomed and appropriately dressed. Her manners were polite and appropriate to the situation. The patient's attitude towards testing was excellent and her effort was good.    Well Groomed, Alert, and Appropriate.   Test Results:   Initially, an estimation was made as to the patient's premorbid intellectual and cognitive functioning looking at education and occupational history and other psychosocial variables.  The patient graduated from college where she attended [employer] maintaining a 3.5 GPA and also attended [employer] college initially.  Patient reported doing well in English classes and some relative difficulty in higher math classes.  Patient was active in high school athletics.  Patient's associates degree was in business and she took classes and public speaking, Spanish class and computer classes and graduated with her undergraduate degree from [employer].  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  Hobbies and interest have included reading, writing, doing yard work and working with flowers as well as taking exercise classes.  I will conservatively estimate that the patient is likely performed roughly 1 standard deviation above population normative values function in the high average range relative to a normative population.  Next an estimation was made as to the validity and reliability of the current objective assessment to accurately depict the patient's current cognitive and intellectual functioning.  The patient appeared to try her hardest throughout the assessment and put forth good to excellent effort throughout the assessment.  The patient did not appear to be overly distressed that any portion of the administration and appeared to try her hardest throughout.  Embedded validity checks including calculations from the digit span measures and forced choice items from latter portions of the memory test all suggested that the patient was putting forth good effort throughout and this does appear to be a fair and valid assessment of the patient's  current functioning.  During both the clinical interview as well as the test administration portion of this evaluation the patient appeared to have very good receptive and expressive language capacity.  There were no descriptions by the patient subjectively or observed indications of word finding difficulties, circumlocutions or paraphasic errors and the patient appeared to have very good receptive language capacity.  WAIS-IV:           Composite Score Summary  Scale Sum of Scaled Scores Composite Score Percentile Rank 95% Conf. Interval Qualitative Description  Verbal Comprehension 27 VCI 95 37 90-101 Average  Perceptual Reasoning 29 PRI 98 45 92-104 Average  Working Memory 19 WMI 97 42 90-104 Average  Processing Speed 28 PSI 122 93 111-128 Superior  Full Scale 103 FSIQ 102 55 98-106 Average  General Ability 56 GAI 96 39 91-101 Average    The patient was administered the Wechsler Adult Intelligence Scale-IV 2 provide a thorough assessment of a broad range of intellectual and cognitive domains.  This measure allows for reassessment as it is one of the most standardized and broadly used cognitive tool with excellent test construction and normative data available.  However, the patient is describing some changes in cognition and 1 should take into account the patient's obtained current score being a description of her current status rather than a prediction or description of lifelong cognitive and intellectual abilities.  The patient produced a full-scale IQ score of 102 which falls at the 55th percentile and is in the average range relative to a normative population.  This is somewhat below predicted levels of premorbid functioning suggesting some minimal changes across various cognitive domains.  We also calculated the patient's general abilities index score which places less emphasis on items typically associated with acute change including information processing speed type variables  and working memory.  Given the fact that her processing speed was her most efficient area of performance it had a negative impact on her general abilities index score.  The patient produced a general abilities index score of 96 which falls at the 39th percentile and is in the average range relative to a normative population.           Verbal Comprehension Subtests Summary        Subtest Raw Score Scaled Score Percentile Rank Reference Group Scaled Score SEM  Similarities 17 8 25 6  1.12  Vocabulary 37 10 50 10 0.73  Information 11 9 37 9 0.73    The patient produced verbal comprehension index score of 95 which falls at the 37th percentile and is in the average range relative to a normative population.  This is roughly 1 standard deviation below predicted levels of premorbid functioning and does suggest some potential mild changes from premorbid capacity.  The patient produced scores in the average range with regard to verbal reasoning and problem-solving, her ability to recall language-based vocabulary information and her general fund of information.  These are only slightly below premorbid estimates in her vocabulary knowledge was at the 50th percentile relative to a normative population.           Perceptual Reasoning Subtests Summary        Subtest Raw Score Scaled Score Percentile Rank Reference Group Scaled Score SEM  Block Design 29 10 50 7 1.27  Matrix Reasoning 12 11 63 6 0.73  Visual Puzzles 8 8 25 6  0.99   The patient produced a perceptual reasoning index score of 98 which falls at the 45th percentile and is in the average range relative to a normative population.  There was some subtle scatter in subtest performance but not significant.  Patient produced scores in the average range relative to measures of visual analysis and organizational capacity with her ability to analyze geometric patterns and identify part-whole recognition skills.  The patient also performed in the average range  on measures of nonverbal reasoning and problem-solving and broader areas of visual intelligence.  The  patient had some relative weakness but still in the average range on measures of her attention to detail and thinking through visual patterns with fluid reasoning requirements.  None of the scores were significantly impaired and only the latter was significantly below what would be predicted based on premorbid estimates.            Working Comptroller Raw Score Scaled Score Percentile Rank Reference Group Scaled Score SEM  Digit Span 21 9 37 6 0.79  Arithmetic 12 10 50 9 0.95    The patient produced a working memory index score of 97 which falls at the 42nd percentile and is in the average range relative to a normative population.  Patient displayed average performances relative to a normative population with regard to her primary auditory recall and working memory capacity and actually improved some relative to a normative population on measures requiring her to actively process information in her working [employer].  This level of performance should not have a significant negative impact on her capacity to adequately attend to auditory information at a level needed to store and organize new information for learning.           Processing Speed Subtests Summary        Subtest Raw Score Scaled Score Percentile Rank Reference Group Scaled Score SEM  Symbol Search 35 16 98 10 1.12  Coding 55 12 75 7 1.12    The patient produced a processing speed index score of 122 which falls at the 93rd percentile and is in the superior range relative to normative population.  This is an excellent score and the patient showed extremely strong ability to process information in a quick and accurate manner displaying excellent working memory with visual stimuli, good psychomotor speed and her ability to absorb new material visually.  This performances certainly not consistent  with typical changes you would see with extensive small vessel disease widespread changes in subcortical white matter brain regions.   WMS-IV:           Index Score Summary        Index Sum of Scaled Scores Index Score Percentile Rank 95% Confidence Interval Qualitative Descriptor  Auditory Memory (AMI) 44 105 63 99-111 Average  Visual Memory (VMI) 23 108 70 103-113 Average  Immediate Memory (IMI) 33 106 66 99-112 Average  Delayed Memory (DMI) 34 108 70 100-115 Average      The patient was then administered the Wechsler Memory Scale-IV for older adults.  On the Wechsler Adult Intelligence Scale, the patient performed in the average range relative to a normative population on measures of auditory encoding and her capacity to actively process information in her auditory Register.  The patient showed a little bit more weakness with regard to visual encoding but still performed at the lower end of the average range with the patient did show greater capacity for auditory encoding versus visual encoding capacity.  In any event.  Both of these measures of auditory and visual encoding were at a level that would not have a major negative impact on her fundamental ability to store and organize new information.  Breaking memory functions down between auditory versus visual memory the patient produced an auditory memory index score of 105 which falls at the 63rd percentile and is in the average range relative to a normative population.  She produced a very similar score for visual as she produced a visual memory  index score of 108, which fell at the [facility] and in the average range relative to a normative population.  There is no significant difference between auditory versus visual memory and both of them are only slightly below predicted levels of premorbid functioning.  Breaking memory down between immediate versus delayed the patient produced an immediate memory index score of 106 which fell at  the 66 percentile and in the average range and a delayed memory index score of 108 which fell at the [facility] and in the average range as well.  Both of these are relatively efficient scores and there was no indication of significant loss of information initially learned.  The patient showed some further improvement in memory functions under recognition/cued recall at least 4 word pair recall and visual recall suggesting that she is adequately learning new information and there is no clear objective findings of any memory or learning deficits and only slightly lower performance than predictions based on premorbid functioning.           Primary Subtest Scaled Score Summary       Subtest Domain Raw Score Scaled Score Percentile Rank  Logical Memory I AM 29 10 50  Logical Memory II AM 15 10 50  Verbal [facility] I AM 22 11 63  Verbal [facility] II AM 8 13 84  Visual Reproduction I VM 33 12 75  Visual Reproduction II VM 19 11 63  Symbol Span VWM 11 8 25           Auditory Memory Process Score Summary      Process Score Raw Score Scaled Score Percentile Rank Cumulative Percentage (Base Rate)  LM II Recognition 14 - - 10-16%  VPA II Recognition 29 - - >75%           Visual Memory Process Score Summary      Process Score Raw Score Scaled Score Percentile Rank Cumulative Percentage (Base Rate)  VR II Recognition 5 - - 51-75%    ABILITY-MEMORY ANALYSIS   Ability Score:    VCI: 95 Date of Testing:           WAIS-IV; WMS-IV 2022/09/09             Predicted Difference Method    Index Predicted WMS-IV Index Score Actual WMS-IV Index Score Difference Critical Value   Significant Difference Y/N Base Rate  Auditory Memory 97 105 -8 9.39 N    Visual Memory 98 108 -10 8.28 Y 25%  Immediate Memory 97 106 -9 10.49 N    Delayed Memory 97 108 -11 12.08 N      Impression/Diagnosis:   Overall, the results of the current neuropsychological evaluation are quite encouraging.   There was no pattern suggesting any type of progressive neurological disorder with the patient performing globally on cognitive measures only slightly below levels of predicted premorbid capacity.  The patient displayed some very mild weakness with regard to visual encoding capacity and auditory encoding capacity and attention were within normal limits.  The patient's visual spatial and language-based skills appear to be well-maintained with no significant change.  Visual and verbal reasoning and problem-solving abilities appear to be intact.  There were no indications of any visual spatial or visual processing deficits noted.  The patient had excellent performance with regard to measures of information processing speed and focus execute abilities and showed good mental flexibility and adaptation capacity.  While auditory and visual memory were slightly below levels of predicted premorbid functioning  they still did not suggest any significant cognitive loss.  There was no difference between auditory versus visual memory and the patient was able to adequately encode and process new information, store and organize new information, and retain information over period of delay.  This was equally true for both auditory and visual information.  There were no indications of expressive or receptive language deficits and motor functioning and gait were all observed as being normal.  Patient denies any visual or auditory hallucinations, tremors or other concerning symptoms.  Patient denies any geographic disorientation as well.  As far as diagnostic considerations, the obtain results on neuropsychological evaluation are very encouraging and are not indicative of any significant progressive neurological disorder.  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  The patient has had previous studies indicating obstructive sleep apnea but most recent sleep study did not identify any significant apneic event.  I would keep an eye on some of these particular features over time to make sure we do not have a reoccurrence of the symptoms down the road as they would have a further negative impact on her memory and attention.  The patient does have a history of depression and anxiety and she continues to take Lexapro without apparent significant side effects and the patient takes trazodone at night to aid with sleep.  I will sit down with the patient and go over the results of the current neuropsychological evaluation and focus on some specific areas around strategies to positively impact her sleep hygiene.  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  The patient has been diagnosed with prediabetes and careful adherence to a good dietary pattern and metabolic balance will also be important.  The patient's significant strength and information processing speed is not consistent with significant or widespread microvascular ischemic changes.  Diagnosis:    Subjective memory complaints  Primary insomnia  Major depressive disorder, remission status unspecified, unspecified whether recurrent  Anxiety  RESTLESS LEG SYNDROME  Pre-diabetes   _____________________ Arley Phenix, Psy.D. Clinical Neuropsychologist

## 2022-11-08 ENCOUNTER — Other Ambulatory Visit (INDEPENDENT_AMBULATORY_CARE_PROVIDER_SITE_OTHER): Payer: PPO

## 2022-11-08 DIAGNOSIS — M2550 Pain in unspecified joint: Secondary | ICD-10-CM | POA: Diagnosis not present

## 2022-11-08 LAB — IBC PANEL
Iron: 81 ug/dL (ref 42–145)
Saturation Ratios: 17.5 % — ABNORMAL LOW (ref 20.0–50.0)
TIBC: 463.4 ug/dL — ABNORMAL HIGH (ref 250.0–450.0)
Transferrin: 331 mg/dL (ref 212.0–360.0)

## 2022-11-09 NOTE — Progress Notes (Unsigned)
[name] [address] [place] [address] Phone: [phone] Subjective:   Cassandra [name], am serving as a scribe for Dr. Antoine Primas.  I'm seeing this patient by the request  of:  Philip Aspen, Limmie Patricia, MD  CC: Hand and arm pain follow-up  [id number]  09/14/2022 Increase gabapentin to 300 mg at night worsening pain, need to consider the possibility of advanced imaging if having worsening discomfort.   Patient does have iron deficiency noted.  Quite severe overall.  Encourage patient to follow-up with the gastroenterologist as soon as possible.  Do feel that this is contributing to some of the increasing muscle aches and pains that patient is having.  Total time discussing the vitamin supplementations with vitamin C and the iron supplementation as well as why it is important for her to see gastroenterology 31 minutes.  No abnormal weight loss though noted.   Underlying fibromyalgia likely also contributing.  May need to consider the possibility of treating with different medications but at the moment patient would like to avoid.  Do feel that there is some underlying anxiety that could be potentially playing a role as well.     Updated 11/10/2022 Cassandra [name] is a 77 y.o. female coming in with complaint of polyarthralgia. Having B knee pain, they have been hurting for longer periods of time. Go over labs. Should she talk to neurologist about pinching pain in hands?      Past Medical History:  Diagnosis Date   Anemia    Anxiety    Asthma    Back pain    BPPV (benign paroxysmal positional vertigo)    Chronic allergic conjunctivitis    Depression    Diverticulosis    Fibromyalgia    Gallbladder problem    Gallstones    GERD (gastroesophageal reflux disease)    Hiatal hernia    History of bladder stone    Iron (Fe) deficiency anemia    Joint pain    Lump in female breast    Mild persistent asthma    followed by pcp--- dr Elvera Lennox.  sharma ([place] allergy/asthma)   OA (osteoarthritis)    knees, hands   OAB (overactive bladder)    OSA (obstructive sleep apnea)    per pt has not used cpap several years   Pancreatic cyst    PONV (postoperative nausea and vomiting)    Pre-diabetes    RLS (restless legs syndrome)    Seasonal and perennial allergic rhinitis    SUI (stress urinary incontinence, female)    Swallowing difficulty    Vitamin B12 deficiency    Vitamin D deficiency    Wears glasses    Past Surgical History:  Procedure Laterality Date   BLADDER SUSPENSION  1990's   sling   bladder tacking  2010   with mesh   BREAST LUMPECTOMY Left 1985   Benign cyst   BREAST REDUCTION SURGERY Bilateral 2003 approx.   CORNEA LESION EXCISION Right 2005  approx.   CYSTOSCOPY N/A 07/12/2016   Procedure: CYSTOSCOPY, VAGINOSCOPY, EXAM UNDER ANESTHESIA, STONE LITHOTRIPSY,;  Surgeon: Malen Gauze, MD;  Location: [facility];  Service: Urology;  Laterality: N/A;   CYSTOSCOPY N/A 10/11/2016   Procedure: CYSTOSCOPY;  Surgeon: Malen Gauze, MD;  Location: [facility];  Service: Urology;  Laterality: N/A;   CYSTOSCOPY WITH LITHOLAPAXY N/A 10/11/2016   Procedure: CYSTOSCOPY WITH LITHOLAPAXY/ EXCISION OF MESH;  Surgeon: Malen Gauze, MD;  Location:  [facility];  Service: Urology;  Laterality: N/A;   CYSTOSCOPY WITH LITHOLAPAXY N/A 01/22/2019   Procedure: CYSTOSCOPY WITH LITHOLAPAXY;  Surgeon: Malen Gauze, MD;  Location[facility];  Service: Urology;  Laterality: N/A;  1 HR   CYSTOSCOPY WITH LITHOLAPAXY N/A 08/09/2022   Procedure: CYSTOLITHOLAPAXY;  Surgeon: Jannifer Hick, MD;  Location: [facility];  Service: Urology;  Laterality: N/A;  45 MINUTES NEEDED FOR CASE   HOLMIUM LASER APPLICATION  07/12/2016   Procedure: HOLMIUM LASER APPLICATION;  Surgeon: Malen Gauze, MD;  Location: [facility];  Service: Urology;;    HOLMIUM LASER APPLICATION N/A 01/22/2019   Procedure: HOLMIUM LASER APPLICATION;  Surgeon: Malen Gauze, MD;  Location: [facility];  Service: Urology;  Laterality: N/A;   RIGHT KNEE ARTHROSCOPY  2013   TOTAL KNEE ARTHROPLASTY Right 09/12/2012   Procedure: RIGHT TOTAL KNEE ARTHROPLASTY;  Surgeon: Loanne Drilling, MD;  Location: [facility];  Service: Orthopedics;  Laterality: Right;   VAGINAL HYSTERECTOMY  1984   Social History   Socioeconomic History   Marital status: Widowed    Spouse name: Not on file   Number of children: 2   Years of education: Not on file   Highest education level: Bachelor's degree (e.g., BA, AB, BS)  Occupational History   Occupation: Engineer, technical sales Retired  Tobacco Use   Smoking status: Never   Smokeless tobacco: Never  Vaping Use   Vaping Use: Never used  Substance and Sexual Activity   Alcohol use: Yes    Comment: 1x monthly   Drug use: Never   Sexual activity: Not on file  Other Topics Concern   Not on file  Social History Narrative   Lives alone   R handed   Caffeine: 1 C a day   Social Determinants of Health   Financial Resource Strain: Low Risk  (08/19/2022)   Overall Financial Resource Strain (CARDIA)    Difficulty of Paying Living Expenses: Not hard at all  Food Insecurity: No Food Insecurity (08/19/2022)   Hunger Vital Sign    Worried About Running Out of Food in the Last Year: Never true    Ran Out of Food in the Last Year: Never true  Transportation Needs: No Transportation Needs (08/19/2022)   PRAPARE - Administrator, Civil Service (Medical): No    Lack of Transportation (Non-Medical): No  Physical Activity: Sufficiently Active (08/19/2022)   Exercise Vital Sign    Days of Exercise per Week: 7 days    Minutes of Exercise per Session: 40 min  Stress: No Stress Concern Present (08/19/2022)   [employer] of Occupational Health - Occupational Stress Questionnaire    Feeling of Stress : Not  at all  Social Connections: Moderately Integrated (08/19/2022)   Social Connection and Isolation Panel [NHANES]    Frequency of Communication with Friends and Family: More than three times a week    Frequency of Social Gatherings with Friends and Family: Twice a week    Attends Religious Services: More than 4 times per year    Active Member of [employer] or Organizations: Yes    Attends Banker Meetings: More than 4 times per year    Marital Status: Widowed   Allergies  Allergen Reactions   Cephalosporins Hives   Chlorhexidine Hives   Codeine     Other reaction(s): Unknown   Hydrochlorothiazide Hives   Misc. Sulfonamide Containing Compounds Hives  Penicillins Hives   Sulfa Antibiotics Hives   Sulfacetamide Sodium     Other reaction(s): Unknown   Family History  Problem Relation Age of Onset   Kidney cancer Mother    Diabetes Mother    Obesity Mother    Dementia Mother        died age 51   Other Father        burn victum   Alcoholism Father    Colon cancer Neg Hx    Inflammatory bowel disease Neg Hx    Liver disease Neg Hx    Pancreatic cancer Neg Hx    Stomach cancer Neg Hx    Rectal cancer Neg Hx     Current Outpatient Medications (Endocrine & Metabolic):    metFORMIN (GLUCOPHAGE) 500 MG tablet, Take 1 tablet (500 mg total) by mouth 2 (two) times daily with a meal.  Current Outpatient Medications (Cardiovascular):    EPINEPHrine 0.3 mg/0.3 mL IJ SOAJ injection, Inject 0.3 mg into the muscle as needed for anaphylaxis.   spironolactone (ALDACTONE) 25 MG tablet, TAKE 1 TABLET (25 MG TOTAL) BY MOUTH DAILY.  Current Outpatient Medications (Respiratory):    fluticasone (FLONASE) 50 MCG/ACT nasal spray, Place into both nostrils.   levocetirizine (XYZAL) 5 MG tablet, Take 5 mg by mouth every evening.     Current Outpatient Medications (Other):    escitalopram (LEXAPRO) 10 MG tablet, Take 1 tablet (10 mg total) by mouth daily with breakfast.   gabapentin  (NEURONTIN) 100 MG capsule, TAKE 2 CAPSULES BY MOUTH AT BEDTIME. (Patient taking differently: Take 300 mg by mouth at bedtime.)   gabapentin (NEURONTIN) 300 MG capsule, Take 1 capsule (300 mg total) by mouth at bedtime.   meclizine (ANTIVERT) 25 MG tablet, TAKE 1 TABLET BY MOUTH 3 TIMES DAILY AS NEEDED FOR DIZZINESS.   Misc Natural Products (TART CHERRY ADVANCED) CAPS, Take 1 capsule by mouth. 1200mg  capsule   omeprazole (PRILOSEC) 20 MG capsule, Take 20 mg by mouth 2 (two) times daily before a meal.   pramipexole (MIRAPEX) 0.25 MG tablet, TAKE 1 TABLET AT NIGHT AS NEEDED FOR RESTLESS LEG SYNDROME   psyllium (REGULOID) 0.52 g capsule, Take 0.52 g by mouth daily.   traZODone (DESYREL) 50 MG tablet, TAKE 1 TABLET BY MOUTH AT BEDTIME AS NEEDED FOR SLEEP.   Reviewed prior external information including notes and imaging from  primary care provider As well as notes that were available from care everywhere and other healthcare systems.  Past medical history, social, surgical and family history all reviewed in electronic medical record.  No pertanent information unless stated regarding to the chief complaint.   Review of Systems:  No headache, visual changes, nausea, vomiting, diarrhea, constipation, dizziness, abdominal pain, skin rash, fevers, chills, night sweats, weight loss, swollen lymph nodes, body aches, joint swelling, chest pain, shortness of breath, mood changes. POSITIVE muscle aches  Objective  Blood pressure 118/72, pulse 80, height 5\' 6"  (1.676 m), weight 204 lb (92.5 kg), SpO2 97 %.   General: No apparent distress alert and oriented x3 mood and affect normal, dressed appropriately.  HEENT: Pupils equal, extraocular movements intact  Respiratory: Patient's speak in full sentences and does not appear short of breath  Cardiovascular: No lower extremity edema, non tender, no erythema  Patient back exam does have some loss lordosis noted.  Some worsening pain with extension of the neck.   Patient does have some pain with sidebending bilaterally.  Patient does have a positive Tinel's noted on the  right wrist.  Procedure: Real-time Ultrasound Guided Injection of right carpal tunnel Device: GE Logiq Q7  Ultrasound guided injection is preferred based studies that show increased duration, increased effect, greater accuracy, decreased procedural pain, increased response rate with ultrasound guided versus blind injection.  Verbal informed consent obtained.  Time-out conducted.  Noted no overlying erythema, induration, or other signs of local infection.  Skin prepped in a sterile fashion.  Local anesthesia: Topical Ethyl chloride.  With sterile technique and under real time ultrasound guidance:  median nerve visualized.  23g 5/8 inch needle inserted distal to proximal approach into nerve sheath. Pictures taken nfor needle placement. Patient did have injection of 2 cc 1 cc of 0.5% Marcaine, and 1 cc of Kenalog 40 mg/dL. Completed without difficulty   Advised to call if fevers/chills, erythema, induration, drainage, or persistent bleeding.  Impression: Technically successful ultrasound guided injection.    Impression and Recommendations:    The above documentation has been reviewed and is accurate and complete Judi Saa, DO

## 2022-11-10 ENCOUNTER — Encounter: Payer: Self-pay | Admitting: Family Medicine

## 2022-11-10 ENCOUNTER — Ambulatory Visit: Payer: PPO | Admitting: Family Medicine

## 2022-11-10 VITALS — BP 118/72 | HR 80 | Ht 66.0 in | Wt 204.0 lb

## 2022-11-10 DIAGNOSIS — G5601 Carpal tunnel syndrome, right upper limb: Secondary | ICD-10-CM | POA: Insufficient documentation

## 2022-11-10 NOTE — Patient Instructions (Addendum)
Good to see you  Tried a carpal tunnel injection.  See if this does help.  If so continue the other side in 2 to 3 weeks.  If not we will workup the neck. Have an appointment set up in 3 months just in case

## 2022-11-10 NOTE — Assessment & Plan Note (Addendum)
Injection given today and tolerated procedure well, we discussed with patient about bracing at night.  Differential includes cervical radiculopathy and we will get x-rays of the neck to further evaluate for any bony abnormality that can be contributing.  Follow-up with me again in 6 to 8 weeks as well.  If patient improves with this can consider potential injection on the contralateral side.

## 2022-11-25 ENCOUNTER — Other Ambulatory Visit: Payer: Self-pay | Admitting: Neurology

## 2022-11-25 ENCOUNTER — Telehealth: Payer: Self-pay | Admitting: Internal Medicine

## 2022-11-25 ENCOUNTER — Other Ambulatory Visit: Payer: Self-pay | Admitting: Family Medicine

## 2022-11-25 ENCOUNTER — Other Ambulatory Visit: Payer: Self-pay

## 2022-11-25 ENCOUNTER — Other Ambulatory Visit: Payer: Self-pay | Admitting: Internal Medicine

## 2022-11-25 ENCOUNTER — Other Ambulatory Visit (INDEPENDENT_AMBULATORY_CARE_PROVIDER_SITE_OTHER): Payer: Self-pay | Admitting: Family Medicine

## 2022-11-25 DIAGNOSIS — F3289 Other specified depressive episodes: Secondary | ICD-10-CM

## 2022-11-25 DIAGNOSIS — R42 Dizziness and giddiness: Secondary | ICD-10-CM

## 2022-11-25 MED ORDER — TRAZODONE HCL 50 MG PO TABS: 50.0000 mg | ORAL_TABLET | Freq: Every evening | ORAL | 1 refills | Status: DC | PRN

## 2022-11-25 NOTE — Telephone Encounter (Signed)
Trazadone was filled today by

## 2022-11-25 NOTE — Telephone Encounter (Signed)
Patient is aware 

## 2022-11-25 NOTE — Telephone Encounter (Signed)
Trazadone was filled to day by Dohmeier, Porfirio Mylar, MD  Lexapro is a historical medication.  Okay to fill?

## 2022-11-25 NOTE — Telephone Encounter (Signed)
Prescription Request  11/25/2022  LOV: 08/19/2022  What is the name of the medication or equipment? traZODone (DESYREL) 50 MG tablet  escitalopram (LEXAPRO) 10 MG tablet  Have you contacted your pharmacy to request a refill? Yes    Which pharmacy would you like this sent to?  [id number] - [place], [place] - [address] AT [address] OF [facility] [address] [place] [address] Phone: [phone] Fax: [phone]    Patient notified that their request is being sent to the clinical staff for review and that they should receive a response within 2 business days.   Please advise at Mobile [phone] (mobile)

## 2022-12-01 ENCOUNTER — Other Ambulatory Visit: Payer: Self-pay | Admitting: Internal Medicine

## 2022-12-01 DIAGNOSIS — F3289 Other specified depressive episodes: Secondary | ICD-10-CM

## 2022-12-01 MED ORDER — ESCITALOPRAM OXALATE 10 MG PO TABS: 10.0000 mg | ORAL_TABLET | Freq: Every day | ORAL | 1 refills | Status: DC

## 2022-12-01 NOTE — Telephone Encounter (Signed)
Left detailed message informing  of update. 

## 2022-12-06 ENCOUNTER — Other Ambulatory Visit: Payer: Self-pay | Admitting: Internal Medicine

## 2022-12-06 ENCOUNTER — Encounter: Payer: Self-pay | Admitting: Internal Medicine

## 2022-12-06 DIAGNOSIS — D509 Iron deficiency anemia, unspecified: Secondary | ICD-10-CM

## 2022-12-07 ENCOUNTER — Other Ambulatory Visit (INDEPENDENT_AMBULATORY_CARE_PROVIDER_SITE_OTHER): Payer: PPO

## 2022-12-07 DIAGNOSIS — D509 Iron deficiency anemia, unspecified: Secondary | ICD-10-CM

## 2022-12-07 LAB — CBC WITH DIFFERENTIAL/PLATELET
Basophils Absolute: 0 10*3/uL (ref 0.0–0.1)
Basophils Relative: 0.5 % (ref 0.0–3.0)
Eosinophils Absolute: 0.1 10*3/uL (ref 0.0–0.7)
Eosinophils Relative: 1.8 % (ref 0.0–5.0)
HCT: 42.4 % (ref 36.0–46.0)
Hemoglobin: 13.4 g/dL (ref 12.0–15.0)
Lymphocytes Relative: 36 % (ref 12.0–46.0)
Lymphs Abs: 2.2 10*3/uL (ref 0.7–4.0)
MCHC: 31.6 g/dL (ref 30.0–36.0)
MCV: 84.4 fl (ref 78.0–100.0)
Monocytes Absolute: 0.5 10*3/uL (ref 0.1–1.0)
Monocytes Relative: 8.3 % (ref 3.0–12.0)
Neutro Abs: 3.3 10*3/uL (ref 1.4–7.7)
Neutrophils Relative %: 53.4 % (ref 43.0–77.0)
Platelets: 244 10*3/uL (ref 150.0–400.0)
RBC: 5.02 Mil/uL (ref 3.87–5.11)
RDW: 18.1 % — ABNORMAL HIGH (ref 11.5–15.5)
WBC: 6.1 10*3/uL (ref 4.0–10.5)

## 2022-12-07 LAB — IBC + FERRITIN
Ferritin: 12 ng/mL (ref 10.0–291.0)
Iron: 67 ug/dL (ref 42–145)
Saturation Ratios: 13.4 % — ABNORMAL LOW (ref 20.0–50.0)
TIBC: 501.2 ug/dL — ABNORMAL HIGH (ref 250.0–450.0)
Transferrin: 358 mg/dL (ref 212.0–360.0)

## 2022-12-24 ENCOUNTER — Other Ambulatory Visit (INDEPENDENT_AMBULATORY_CARE_PROVIDER_SITE_OTHER): Payer: Self-pay | Admitting: Family Medicine

## 2022-12-24 DIAGNOSIS — R7303 Prediabetes: Secondary | ICD-10-CM

## 2023-01-02 ENCOUNTER — Other Ambulatory Visit: Payer: Self-pay

## 2023-01-02 ENCOUNTER — Ambulatory Visit (HOSPITAL_COMMUNITY)
Admission: EM | Admit: 2023-01-02 | Discharge: 2023-01-02 | Disposition: A | Discharge status: Home / Self Care | Payer: PPO | Attending: Physician Assistant | Admitting: Physician Assistant

## 2023-01-02 ENCOUNTER — Encounter (HOSPITAL_COMMUNITY): Payer: Self-pay | Admitting: Emergency Medicine

## 2023-01-02 DIAGNOSIS — T63441A Toxic effect of venom of bees, accidental (unintentional), initial encounter: Secondary | ICD-10-CM | POA: Diagnosis not present

## 2023-01-02 MED ORDER — HYDROXYZINE HCL 10 MG PO TABS: 10.0000 mg | ORAL_TABLET | Freq: Four times a day (QID) | ORAL | 0 refills | Status: DC | PRN

## 2023-01-02 MED ORDER — CLINDAMYCIN HCL 300 MG PO CAPS: 300.0000 mg | ORAL_CAPSULE | Freq: Three times a day (TID) | ORAL | 0 refills | Status: AC

## 2023-01-02 MED ORDER — DEXAMETHASONE SODIUM PHOSPHATE 10 MG/ML IJ SOLN
5.0000 mg | Freq: Once | INTRAMUSCULAR | Status: AC
  Administered 2023-01-02: 5 mg via INTRAMUSCULAR

## 2023-01-02 MED ORDER — DEXAMETHASONE SODIUM PHOSPHATE 10 MG/ML IJ SOLN
INTRAMUSCULAR | Status: AC
  Filled 2023-01-02: qty 1

## 2023-01-02 NOTE — Discharge Instructions (Addendum)
Follow up with allergist Return with new or worsening symptoms

## 2023-01-02 NOTE — ED Provider Notes (Signed)
[facility]    CSN: [id number] Arrival date & time: 01/02/23  1203      History   Chief Complaint Chief Complaint  Patient presents with   Rash    HPI Cassandra [name] is a 77 y.o. female.   Patient here for evaluation of bee stings x yesterday. Stung 11 times by ground wasps.  Admits itchiness and some pain.  Denies f/c, angioedema, cough, wheezing, SOB, light headed, dizziness.  Taking 50 mg benadryl w/o relief.  She has an appointment with her allergist in 3 days.    Past Medical History:  Diagnosis Date   Anemia    Anxiety    Asthma    Back pain    BPPV (benign paroxysmal positional vertigo)    Chronic allergic conjunctivitis    Depression    Diverticulosis    Fibromyalgia    Gallbladder problem    Gallstones    GERD (gastroesophageal reflux disease)    Hiatal hernia    History of bladder stone    Iron (Fe) deficiency anemia    Joint pain    Lump in female breast    Mild persistent asthma    followed by pcp--- dr r. sharma ([place] allergy/asthma)   OA (osteoarthritis)    knees, hands   OAB (overactive bladder)    OSA (obstructive sleep apnea)    per pt has not used cpap several years   Pancreatic cyst    PONV (postoperative nausea and vomiting)    Pre-diabetes    RLS (restless legs syndrome)    Seasonal and perennial allergic rhinitis    SUI (stress urinary incontinence, female)    Swallowing difficulty    Vitamin B12 deficiency    Vitamin D deficiency    Wears glasses     Patient Active Problem List   Diagnosis Date Noted   Right carpal tunnel syndrome 11/10/2022   Microcytic anemia 08/31/2022   Arterial hypotension 08/24/2022   BMI 33.0-33.9,adult 06/22/2022   AC (acromioclavicular) arthritis 06/02/2022   Acquired trigger finger of left ring finger 06/02/2022   Vitamin D deficiency 01/13/2022   Other fatigue 12/15/2021   Psychophysiological insomnia 12/15/2021   Inadequate sleep hygiene 12/15/2021   History of sleep apnea  12/15/2021   Anxiety 12/15/2021   Class 1 obesity due to excess calories without serious comorbidity with body mass index (BMI) of 33.0 to 33.9 in adult 12/15/2021   Insulin resistance 10/20/2021   Hyperlipidemia 10/20/2021   Depression 10/20/2021   Pancreatic lesion 01/30/2021   Cholelithiasis 01/30/2021   RUQ pain 01/30/2021   LUQ pain 09/15/2020   Calculus of gallbladder without cholecystitis without obstruction 09/15/2020   Hamstring tendinitis 09/10/2020   Pyrosis 07/21/2020   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   Right hip pain 06/12/2020   Pre-diabetes 03/12/2020   Seasonal allergies 03/12/2020   Generalized obesity 10/30/2019   Low back pain 09/11/2019   Degenerative joint disease of knee, left 09/11/2019   Pancreatic cyst 01/25/2019   Gastroesophageal reflux disease 01/25/2019   Esophageal dysphagia 01/25/2019   Unintentional weight loss 01/25/2019   IGT (impaired glucose tolerance) 01/17/2019   BPPV (benign paroxysmal positional vertigo) 10/23/2012   Vertigo 10/21/2012   UTI (lower urinary tract infection) 10/21/2012   Nausea & vomiting 10/21/2012   Postoperative anemia due to acute blood loss 09/14/2012   OA (osteoarthritis) of knee 09/12/2012   Insomnia 08/20/2011   OBSTRUCTIVE SLEEP APNEA 04/23/2008   RESTLESS LEG SYNDROME 04/23/2008   FIBROMYALGIA 01/06/2007  Past Surgical History:  Procedure Laterality Date   BLADDER SUSPENSION  1990's   sling   bladder tacking  2010   with mesh   BREAST LUMPECTOMY Left 1985   Benign cyst   BREAST REDUCTION SURGERY Bilateral 2003 approx.   CORNEA LESION EXCISION Right 2005  approx.   CYSTOSCOPY N/A 07/12/2016   Procedure: CYSTOSCOPY, VAGINOSCOPY, EXAM UNDER ANESTHESIA, STONE LITHOTRIPSY,;  Surgeon: Malen Gauze, MD;  Location: [facility];  Service: Urology;  Laterality: N/A;   CYSTOSCOPY N/A 10/11/2016   Procedure: CYSTOSCOPY;  Surgeon: Malen Gauze, MD;  Location:  [facility];  Service: Urology;  Laterality: N/A;   CYSTOSCOPY WITH LITHOLAPAXY N/A 10/11/2016   Procedure: CYSTOSCOPY WITH LITHOLAPAXY/ EXCISION OF MESH;  Surgeon: Malen Gauze, MD;  Location: [facility];  Service: Urology;  Laterality: N/A;   CYSTOSCOPY WITH LITHOLAPAXY N/A 01/22/2019   Procedure: CYSTOSCOPY WITH LITHOLAPAXY;  Surgeon: Malen Gauze, MD;  Location: [facility];  Service: Urology;  Laterality: N/A;  1 HR   CYSTOSCOPY WITH LITHOLAPAXY N/A 08/09/2022   Procedure: CYSTOLITHOLAPAXY;  Surgeon: Jannifer Hick, MD;  Location[facility];  Service: Urology;  Laterality: N/A;  45 MINUTES NEEDED FOR CASE   HOLMIUM LASER APPLICATION  07/12/2016   Procedure: HOLMIUM LASER APPLICATION;  Surgeon: Malen Gauze, MD;  Location: [facility];  Service: Urology;;   HOLMIUM LASER APPLICATION N/A 01/22/2019   Procedure: HOLMIUM LASER APPLICATION;  Surgeon: Malen Gauze, MD;  Location: [facility];  Service: Urology;  Laterality: N/A;   RIGHT KNEE ARTHROSCOPY  2013   TOTAL KNEE ARTHROPLASTY Right 09/12/2012   Procedure: RIGHT TOTAL KNEE ARTHROPLASTY;  Surgeon: Loanne Drilling, MD;  Location: [facility];  Service: Orthopedics;  Laterality: Right;   VAGINAL HYSTERECTOMY  1984    OB History     Gravida  2   Para      Term      Preterm      AB      Living  2      SAB      IAB      Ectopic      Multiple      Live Births               Home Medications    Prior to Admission medications   Medication Sig Start Date End Date Taking? Authorizing Provider  clindamycin (CLEOCIN) 300 MG capsule Take 1 capsule (300 mg total) by mouth 3 (three) times daily for 7 days. 01/02/23 01/09/23 Yes Evern Core, PA-C  hydrOXYzine (ATARAX) 10 MG tablet Take 1-2 tablets (10-20 mg total) by mouth every 6 (six) hours as needed for itching. 01/02/23  Yes Evern Core, PA-C  EPINEPHrine 0.3  mg/0.3 mL IJ SOAJ injection Inject 0.3 mg into the muscle as needed for anaphylaxis.    [provider]  escitalopram (LEXAPRO) 10 MG tablet Take 1 tablet (10 mg total) by mouth daily with breakfast. 12/01/22   Philip Aspen, Limmie Patricia, MD  fluticasone [facility]) 50 MCG/ACT nasal spray Place into both nostrils. 01/17/20   [provider]  gabapentin (NEURONTIN) 100 MG capsule TAKE 2 CAPSULES BY MOUTH AT BEDTIME. Patient taking differently: Take 300 mg by mouth at bedtime. 07/20/22   Judi Saa, DO  gabapentin (NEURONTIN) 300 MG capsule TAKE 1 CAPSULE BY MOUTH EVERYDAY AT BEDTIME 11/25/22   Judi Saa, DO  levocetirizine (  XYZAL) 5 MG tablet Take 5 mg by mouth every evening.  04/07/18   [provider]  meclizine (ANTIVERT) 25 MG tablet TAKE 1 TABLET BY MOUTH 3 TIMES DAILY AS NEEDED FOR DIZZINESS. 09/27/22   Philip Aspen, Limmie Patricia, MD  metFORMIN (GLUCOPHAGE) 500 MG tablet Take 1 tablet (500 mg total) by mouth 2 (two) times daily with a meal. 09/28/22   Whitmire, Thermon Leyland, FNP  Misc Natural Products (TART CHERRY ADVANCED) CAPS Take 1 capsule by mouth. 1200mg  capsule    [provider]  omeprazole (PRILOSEC) 20 MG capsule Take 20 mg by mouth 2 (two) times daily before a meal.    [provider]  pramipexole (MIRAPEX) 0.25 MG tablet TAKE 1 TABLET AT NIGHT AS NEEDED FOR RESTLESS LEG SYNDROME 08/03/22   Dohmeier, Porfirio Mylar, MD  psyllium (REGULOID) 0.52 g capsule Take 0.52 g by mouth daily.    [provider]  spironolactone (ALDACTONE) 25 MG tablet TAKE 1 TABLET (25 MG TOTAL) BY MOUTH DAILY. 11/25/22   Philip Aspen, Limmie Patricia, MD  traZODone (DESYREL) 50 MG tablet Take 1 tablet (50 mg total) by mouth at bedtime as needed. for sleep 11/25/22   Dohmeier, Porfirio Mylar, MD    Family History Family History  Problem Relation Age of Onset   Kidney cancer Mother    Diabetes Mother    Obesity Mother    Dementia Mother        died age 74   Other Father         burn victum   Alcoholism Father    Colon cancer Neg Hx    Inflammatory bowel disease Neg Hx    Liver disease Neg Hx    Pancreatic cancer Neg Hx    Stomach cancer Neg Hx    Rectal cancer Neg Hx     Social History Social History   Tobacco Use   Smoking status: Never   Smokeless tobacco: Never  Vaping Use   Vaping status: Never Used  Substance Use Topics   Alcohol use: Yes    Comment: 1x monthly   Drug use: Never     Allergies   Cephalosporins, Chlorhexidine, Codeine, Hydrochlorothiazide, Misc. sulfonamide containing compounds, Penicillins, Sulfa antibiotics, and Sulfacetamide sodium   Review of Systems Review of Systems  Constitutional:  Negative for chills, fatigue and fever.  Eyes:  Negative for pain and redness.  Respiratory:  Negative for cough, shortness of breath and wheezing.   Gastrointestinal:  Negative for abdominal pain, diarrhea, nausea and vomiting.  Musculoskeletal:  Positive for myalgias. Negative for arthralgias.  Skin:  Positive for color change and rash.  Neurological:  Negative for light-headedness and headaches.  Hematological:  Negative for adenopathy. Does not bruise/bleed easily.  Psychiatric/Behavioral:  Negative for confusion and sleep disturbance.      Physical Exam Triage Vital Signs ED Triage Vitals  Encounter Vitals Group     BP 01/02/23 1258 (!) 109/59     Systolic BP Percentile --      Diastolic BP Percentile --      Pulse Rate 01/02/23 1258 69     Resp 01/02/23 1258 18     Temp 01/02/23 1258 98 F (36.7 C)     Temp Source 01/02/23 1258 Oral     SpO2 01/02/23 1258 100 %     Weight 01/02/23 1259 205 lb 0.4 oz (93 kg)     Height 01/02/23 1259 5\' 6"  (1.676 m)     Head Circumference --  Peak Flow --      Pain Score 01/02/23 1259 6     Pain Loc --      Pain Education --      Exclude from Growth Chart --    No data found.  Updated Vital Signs BP (!) 109/59 (BP Location: Right Arm)   Pulse 69   Temp 98 F (36.7 C)  (Oral)   Resp 18   Ht 5\' 6"  (1.676 m)   Wt 205 lb 0.4 oz (93 kg)   SpO2 100%   BMI 33.09 kg/m   Visual Acuity Right Eye Distance:   Left Eye Distance:   Bilateral Distance:    Right Eye Near:   Left Eye Near:    Bilateral Near:     Physical Exam Vitals and nursing note reviewed.  Constitutional:      General: She is not in acute distress.    Appearance: Normal appearance. She is not ill-appearing.  HENT:     Head: Normocephalic and atraumatic.     Mouth/Throat:     Mouth: No oral lesions or angioedema.     Pharynx: Uvula midline. No pharyngeal swelling, posterior oropharyngeal erythema or uvula swelling.     Tonsils: No tonsillar exudate.  Eyes:     General: No scleral icterus.    Extraocular Movements: Extraocular movements intact.     Conjunctiva/sclera: Conjunctivae normal.  Cardiovascular:     Rate and Rhythm: Normal rate and regular rhythm.     Heart sounds: No murmur heard. Pulmonary:     Effort: Pulmonary effort is normal. No respiratory distress.     Breath sounds: Normal breath sounds. No wheezing or rales.  Musculoskeletal:     Cervical back: Normal range of motion. No rigidity.  Skin:    Coloration: Skin is not jaundiced.     Findings: No rash.     Comments: Multiple erythematous raised welts along arms and legs, various sizes.  Largest L upper arm with some central purulence.  8 cm diameter, mildly tender, warm to touch  Neurological:     General: No focal deficit present.     Mental Status: She is alert and oriented to person, place, and time.     Motor: No weakness.     Gait: Gait normal.  Psychiatric:        Mood and Affect: Mood normal.        Behavior: Behavior normal.      UC Treatments / Results  Labs (all labs ordered are listed, but only abnormal results are displayed) Labs Reviewed - No data to display  EKG   Radiology No results found.  Procedures Procedures (including critical care time)  Medications Ordered in  UC Medications  dexamethasone (DECADRON) injection 5 mg (5 mg Intramuscular Given 01/02/23 1331)    Initial Impression / Assessment and Plan / UC Course  I have reviewed the triage vital signs and the nursing notes.  Pertinent labs & imaging results that were available during my care of the patient were reviewed by me and considered in my medical decision making (see chart for details).     Discontinue benadryl Take medication as prescribed Follow up with allergist on Wednesday, go to ED if you develop swelling of lips/tongue or any difficulty breathing  Final Clinical Impressions(s) / UC Diagnoses   Final diagnoses:  Bee sting, accidental or unintentional, initial encounter     Discharge Instructions      Follow up with allergist Return with new or  worsening symptoms    ED Prescriptions     Medication Sig Dispense Auth. Provider   hydrOXYzine (ATARAX) 10 MG tablet Take 1-2 tablets (10-20 mg total) by mouth every 6 (six) hours as needed for itching. 20 tablet Evern Core, PA-C   clindamycin (CLEOCIN) 300 MG capsule Take 1 capsule (300 mg total) by mouth 3 (three) times daily for 7 days. 21 capsule Evern Core, PA-C      PDMP not reviewed this encounter.   Evern Core, PA-C 01/02/23 1338

## 2023-01-02 NOTE — ED Triage Notes (Signed)
Pt having rash and swollen on arms and legs from bee stings yesterday.

## 2023-01-03 DIAGNOSIS — D692 Other nonthrombocytopenic purpura: Secondary | ICD-10-CM | POA: Diagnosis not present

## 2023-01-03 DIAGNOSIS — Z85828 Personal history of other malignant neoplasm of skin: Secondary | ICD-10-CM | POA: Diagnosis not present

## 2023-01-05 DIAGNOSIS — J4530 Mild persistent asthma, uncomplicated: Secondary | ICD-10-CM | POA: Diagnosis not present

## 2023-01-05 DIAGNOSIS — J301 Allergic rhinitis due to pollen: Secondary | ICD-10-CM | POA: Diagnosis not present

## 2023-01-05 DIAGNOSIS — Z91030 Bee allergy status: Secondary | ICD-10-CM | POA: Diagnosis not present

## 2023-01-05 DIAGNOSIS — J3089 Other allergic rhinitis: Secondary | ICD-10-CM | POA: Diagnosis not present

## 2023-01-09 ENCOUNTER — Other Ambulatory Visit: Payer: Self-pay

## 2023-01-09 NOTE — Progress Notes (Signed)
Pharmacy Quality Measure Review  This patient is appearing on a report for being at risk of failing the adherence measure for diabetes medications this calendar year.   Medication: metformin 500 mg  Last fill date: 09/28/22 for 90 day supply (0 refills)  Pt reports that she was prescribed metformin for prediabetes at [facility]. She did briefly take the medication then stopped because she was "feeling good" and she does not like to take medication. She does not wish to continue on the medication, and would not like a new Rx.  Confirmed that patient was aware of her last A1c of 6.2% being very close to the diabetic range. She plans to work on diet and lifestyle prior to recheck. PCP will cont to follow - no further pharmacy action needed at this time.   Nils Pyle, PharmD PGY1 Pharmacy Resident

## 2023-01-10 ENCOUNTER — Encounter: Payer: Self-pay | Admitting: Adult Health

## 2023-01-10 ENCOUNTER — Ambulatory Visit: Payer: PPO | Admitting: Adult Health

## 2023-01-10 VITALS — BP 115/68 | HR 75 | Ht 65.5 in | Wt 203.0 lb

## 2023-01-10 DIAGNOSIS — R42 Dizziness and giddiness: Secondary | ICD-10-CM

## 2023-01-10 DIAGNOSIS — E611 Iron deficiency: Secondary | ICD-10-CM

## 2023-01-10 DIAGNOSIS — G2581 Restless legs syndrome: Secondary | ICD-10-CM

## 2023-01-10 DIAGNOSIS — F5104 Psychophysiologic insomnia: Secondary | ICD-10-CM | POA: Diagnosis not present

## 2023-01-10 NOTE — Patient Instructions (Addendum)
Recommend referral to ENT to evaluate for ongoing vertigo - more so to rule out Meniere's disease   Continue trazodone 50mg  nightly for sleep   Please let me know if you would like to try any other medication to help with restless leg symptoms. Please continue to follow with your primary doctor for routine monitoring of iron levels - low levels can can your symptoms      Follow up in 6 months or call earlier if needed      Restless Legs Syndrome Restless legs syndrome is a condition that causes uncomfortable feelings or sensations in the legs, especially while sitting or lying down. The sensations usually cause an overwhelming urge to move the legs. The arms can also sometimes be affected. The condition can range from mild to severe. The symptoms often interfere with a person's ability to sleep. What are the causes? The cause of this condition is not known. What increases the risk? The following factors may make you more likely to develop this condition: Being older than 50. Pregnancy. Being a woman. In general, the condition is more common in women than in men. A family history of the condition. Having iron deficiency. Overuse of caffeine, nicotine, or alcohol. Certain medical conditions, such as kidney disease, Parkinson's disease, or nerve damage. Certain medicines, such as those for high blood pressure, nausea, colds, allergies, depression, and some heart conditions. What are the signs or symptoms? The main symptom of this condition is uncomfortable sensations in the legs, such as: Pulling. Tingling. Prickling. Throbbing. Crawling. Burning. Usually, the sensations: Affect both sides of the body. Are worse when you sit or lie down. Are worse at night. These may make it difficult to fall asleep. Make you have a strong urge to move your legs. Are temporarily relieved by moving your legs or standing. The arms can also be affected, but this is rare. People who have this  condition often have tiredness during the day because of their lack of sleep at night. How is this diagnosed? This condition may be diagnosed based on: Your symptoms. Blood tests. In some cases, you may be monitored in a sleep lab by a specialist (a sleep study). This can detect any disruptions in your sleep. How is this treated? This condition is treated by managing the symptoms. This may include: Lifestyle changes, such as exercising, using relaxation techniques, and avoiding caffeine, alcohol, or tobacco. Iron supplements. Medicines. Parkinson's medications may be tried first. Anti-seizure medications can also be helpful. Follow these instructions at home: General instructions Take over-the-counter and prescription medicines only as told by your health care provider. Use methods to help relieve the uncomfortable sensations, such as: Massaging your legs. Walking or stretching. Taking a cold or hot bath. Keep all follow-up visits. This is important. Lifestyle     Practice good sleep habits. For example, go to bed and get up at the same time every day. Most adults should get 7-9 hours of sleep each night. Exercise regularly. Try to get at least 30 minutes of exercise most days of the week. Practice ways of relaxing, such as yoga or meditation. Avoid caffeine and alcohol. Do not use any products that contain nicotine or tobacco. These products include cigarettes, chewing tobacco, and vaping devices, such as e-cigarettes. If you need help quitting, ask your health care provider. Where to find more information [employer] of Neurological Disorders and Stroke: [employer] Contact a health care provider if: Your symptoms get worse or they do not improve with treatment.  Summary Restless legs syndrome is a condition that causes uncomfortable feelings or sensations in the legs, especially while sitting or lying down. The symptoms often interfere with your ability to  sleep. This condition is treated by managing the symptoms. You may need to make lifestyle changes or take medicines. This information is not intended to replace advice given to you by your health care provider. Make sure you discuss any questions you have with your health care provider. Document Revised: 12/28/2020 Document Reviewed: 12/28/2020 Elsevier Patient Education  2024 [employer] Inc.       Iron-Rich Diet  Iron is a mineral that helps your body produce hemoglobin. Hemoglobin is a protein in red blood cells that carries oxygen to your body's tissues. Eating too little iron may cause you to feel weak and tired, and it can increase your risk of infection. Iron is naturally found in many foods, and many foods have iron added to them (are iron-fortified). You may need to follow an iron-rich diet if you do not have enough iron in your body due to certain medical conditions. The amount of iron that you need each day depends on your age, your sex, and any medical conditions you have. Follow instructions from your health care provider or a dietitian about how much iron you should eat each day. What are tips for following this plan? Reading food labels Check food labels to see how many milligrams (mg) of iron are in each serving. Cooking Cook foods in pots and pans that are made from iron. Take these steps to make it easier for your body to absorb iron from certain foods: Soak beans overnight before cooking. Soak whole grains overnight and drain them before using. Ferment flours before baking, such as by using yeast in bread dough. Meal planning When you eat foods that contain iron, you should eat them with foods that are high in vitamin C. These include oranges, peppers, tomatoes, potatoes, and mangoes. Vitamin C helps your body absorb iron. Certain foods and drinks prevent your body from absorbing iron properly. Avoid eating these foods in the same meal as iron-rich foods or with iron  supplements. These foods include: Coffee, black tea, and red wine. Milk, dairy products, and foods that are high in calcium. Beans and soybeans. Whole grains. General information Take iron supplements only as told by your health care provider. An overdose of iron can be life-threatening. If you were prescribed iron supplements, take them with orange juice or a vitamin C supplement. When you eat iron-fortified foods or take an iron supplement, you should also eat foods that naturally contain iron, such as meat, poultry, and fish. Eating naturally iron-rich foods helps your body absorb the iron that is added to other foods or contained in a supplement. Iron from animal sources is better absorbed than iron from plant sources. What foods should I eat? Fruits Prunes. Raisins. Eat fruits high in vitamin C, such as oranges, grapefruits, and strawberries, with iron-rich foods. Vegetables Spinach (cooked). Green peas. Broccoli. Fermented vegetables. Eat vegetables high in vitamin C, such as leafy greens, potatoes, bell peppers, and tomatoes, with iron-rich foods. Grains Iron-fortified breakfast cereal. Iron-fortified whole-wheat bread. Enriched rice. Sprouted grains. Meats and other proteins Beef liver. Beef. [place]. Chicken. Oysters. Shrimp. Tuna. Sardines. Chickpeas. Nuts. Tofu. Pumpkin seeds. Beverages Tomato juice. Fresh orange juice. Prune juice. Hibiscus tea. Iron-fortified instant breakfast shakes. Sweets and desserts Blackstrap molasses. Seasonings and condiments Tahini. Fermented soy sauce. Other foods Wheat germ. The items listed above may not  be a complete list of recommended foods and beverages. Contact a dietitian for more information. What foods should I limit? These are foods that should be limited while eating iron-rich foods as they can reduce the absorption of iron in your body. Grains Whole grains. Bran cereal. Bran flour. Meats and other proteins Soybeans. Products made  from soy protein. Black beans. Lentils. Mung beans. Split peas. Dairy Milk. Cream. Cheese. Yogurt. Cottage cheese. Beverages Coffee. Black tea. Red wine. Sweets and desserts Cocoa. Chocolate. Ice cream. Seasonings and condiments Basil. Oregano. Large amounts of parsley. The items listed above may not be a complete list of foods and beverages you should limit. Contact a dietitian for more information. Summary Iron is a mineral that helps your body produce hemoglobin. Hemoglobin is a protein in red blood cells that carries oxygen to your body's tissues. Iron is naturally found in many foods, and many foods have iron added to them (are iron-fortified). When you eat foods that contain iron, you should eat them with foods that are high in vitamin C. Vitamin C helps your body absorb iron. Certain foods and drinks prevent your body from absorbing iron properly, such as whole grains and dairy products. You should avoid eating these foods in the same meal as iron-rich foods or with iron supplements. This information is not intended to replace advice given to you by your health care provider. Make sure you discuss any questions you have with your health care provider. Document Revised: 04/28/2020 Document Reviewed: 04/28/2020 Elsevier Patient Education  2024 [employer].

## 2023-01-10 NOTE — Progress Notes (Signed)
[facility] [address] [place]. [place] [address] [phone]       OFFICE FOLLOW UP NOTE  Ms. Cassandra [name] Date of Birth:  1946/01/23 Medical Record Number:  [id number]   Primary neurologist: Dr. Vickey Huger Reason for visit: Insomnia    SUBJECTIVE:   CHIEF COMPLAINT:  Chief Complaint  Patient presents with   Follow-up    Rm 3, here alone  Pt is here for follow up. Pt states she has been sleeping more hours. States still wakes up a little tired however states she is feeling better.    Follow-up visit:  Prior visit: 01/14/2022 with Dr. Vickey Huger  Brief HPI:   Cassandra [name] is a 77 y.o. female who was initially evaluated by Dr. Vickey Huger on 12/15/2021 for concern of insomnia.  Underwent sleep study 2009 by Dr. Craige Cotta with borderline sleep apnea and difficulty tolerating CPAP.  Repeated sleep study 12/30/2021 which did not show any significant sleep apnea but did show frequent periodic limb movements and insomnia with sleep initiation.   At prior visit, recommended trialing Mirapex PRN and trazodone nightly.  Also referred to neuropsychology for history of hyperactivity, impulsivity and anxiety possibly contributing to insomnia as well as memory loss complaints.   Interval history:  Overall has been doing well since prior visit.  Continues on trazodone 50 mg nightly, feels this dosage is working well and tolerating well, still having some difficulty falling asleep at times, can lay there for a couple hours before falling asleep.  She typically gets between 5 to 8 hours of sleep which is a great improvement for her. Has tried to increase dosage to 75 mg but had morning grogginess. Has watch that tracks her sleep and notes she still wakes up multiple times during the night but she is not aware of this.  Reports initial improvement of RLS on Mirapex but started to notice worsening RLS symptoms, stopped medication 2-3 weeks ago with improvement, only bothersome a  couple nights out of the week currently, can be bothersome right before going to bed or once she is in bed which can interfere with falling asleep.  She is currently on gabapentin 300 mg nightly by sports medicine Dr. Katrinka Blazing. Also found to have iron deficiency, currently on oral iron supplement, most recent lab work 7/9 showed ferritin level 12 (prior 7.4).   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  Currently using meclizine but unsure how beneficial, causes fatigue. Has not previously seen ENT specifically for vertigo evaluation.  Underwent neurocognitive testing in 10/2022 with Dr. Kieth Brightly which did not show any progressive neurological disorder and felt memory complaints likely normal age-related changes, underlying sleep disturbance and longstanding anxiety/depression. Today, denies any significant memory loss concerns, denies having significant concerns previously, more was concerned regarding history of dementia in her mother. Reports having more age related memory difficulties at this time.  Continues to maintain ADLs and IADLs independently as well as driving     ROS:   14 system review of systems performed and negative with exception of those listed in HPI  PMH:  Past Medical History:  Diagnosis Date   Anemia    Anxiety    Asthma    Back pain    BPPV (benign paroxysmal positional vertigo)    Chronic allergic conjunctivitis    Depression    Diverticulosis    Fibromyalgia    Gallbladder problem  Gallstones    GERD (gastroesophageal reflux disease)    Hiatal hernia    History of bladder stone    Iron (Fe) deficiency anemia    Joint pain    Lump in female breast    Mild persistent asthma    followed by pcp--- dr Elvera Lennox. sharma ([place] allergy/asthma)   OA (osteoarthritis)    knees, hands   OAB (overactive bladder)    OSA (obstructive sleep apnea)     per pt has not used cpap several years   Pancreatic cyst    PONV (postoperative nausea and vomiting)    Pre-diabetes    RLS (restless legs syndrome)    Seasonal and perennial allergic rhinitis    SUI (stress urinary incontinence, female)    Swallowing difficulty    Vitamin B12 deficiency    Vitamin D deficiency    Wears glasses     PSH:  Past Surgical History:  Procedure Laterality Date   BLADDER SUSPENSION  1990's   sling   bladder tacking  2010   with mesh   BREAST LUMPECTOMY Left 1985   Benign cyst   BREAST REDUCTION SURGERY Bilateral 2003 approx.   CORNEA LESION EXCISION Right 2005  approx.   CYSTOSCOPY N/A 07/12/2016   Procedure: CYSTOSCOPY, VAGINOSCOPY, EXAM UNDER ANESTHESIA, STONE LITHOTRIPSY,;  Surgeon: Malen Gauze, MD;  Location: [facility];  Service: Urology;  Laterality: N/A;   CYSTOSCOPY N/A 10/11/2016   Procedure: CYSTOSCOPY;  Surgeon: Malen Gauze, MD;  Location: [facility];  Service: Urology;  Laterality: N/A;   CYSTOSCOPY WITH LITHOLAPAXY N/A 10/11/2016   Procedure: CYSTOSCOPY WITH LITHOLAPAXY/ EXCISION OF MESH;  Surgeon: Malen Gauze, MD;  Location: [facility];  Service: Urology;  Laterality: N/A;   CYSTOSCOPY WITH LITHOLAPAXY N/A 01/22/2019   Procedure: CYSTOSCOPY WITH LITHOLAPAXY;  Surgeon: Malen Gauze, MD;  Location: [facility];  Service: Urology;  Laterality: N/A;  1 HR   CYSTOSCOPY WITH LITHOLAPAXY N/A 08/09/2022   Procedure: CYSTOLITHOLAPAXY;  Surgeon: Jannifer Hick, MD;  Location: [facility];  Service: Urology;  Laterality: N/A;  45 MINUTES NEEDED FOR CASE   HOLMIUM LASER APPLICATION  07/12/2016   Procedure: HOLMIUM LASER APPLICATION;  Surgeon: Malen Gauze, MD;  Location: [facility];  Service: Urology;;   HOLMIUM LASER APPLICATION N/A 01/22/2019   Procedure: HOLMIUM LASER APPLICATION;  Surgeon: Malen Gauze, MD;  Location:  [facility];  Service: Urology;  Laterality: N/A;   RIGHT KNEE ARTHROSCOPY  2013   TOTAL KNEE ARTHROPLASTY Right 09/12/2012   Procedure: RIGHT TOTAL KNEE ARTHROPLASTY;  Surgeon: Loanne Drilling, MD;  Location: [facility];  Service: Orthopedics;  Laterality: Right;   VAGINAL HYSTERECTOMY  1984    Social History:  Social History   Socioeconomic History   Marital status: Widowed    Spouse name: Not on file   Number of children: 2   Years of education: Not on file   Highest education level: Bachelor's degree (e.g., BA, AB, BS)  Occupational History   Occupation: Engineer, technical sales Retired  Tobacco Use   Smoking status: Never   Smokeless tobacco: Never  Vaping Use   Vaping status: Never Used  Substance and Sexual Activity   Alcohol use: Yes    Comment: 1x monthly   Drug use: Never   Sexual activity: Not on file  Other Topics Concern   Not on file  Social History Narrative   Lives  alone   R handed   Caffeine: 1 C a day   Social Determinants of Health   Financial Resource Strain: Low Risk  (08/19/2022)   Overall Financial Resource Strain (CARDIA)    Difficulty of Paying Living Expenses: Not hard at all  Food Insecurity: No Food Insecurity (08/19/2022)   Hunger Vital Sign    Worried About Running Out of Food in the Last Year: Never true    Ran Out of Food in the Last Year: Never true  Transportation Needs: No Transportation Needs (08/19/2022)   PRAPARE - Administrator, Civil Service (Medical): No    Lack of Transportation (Non-Medical): No  Physical Activity: Sufficiently Active (08/19/2022)   Exercise Vital Sign    Days of Exercise per Week: 7 days    Minutes of Exercise per Session: 40 min  Stress: No Stress Concern Present (08/19/2022)   [employer] of Occupational Health - Occupational Stress Questionnaire    Feeling of Stress : Not at all  Social Connections: Moderately Integrated (08/19/2022)   Social Connection and Isolation Panel  [NHANES]    Frequency of Communication with Friends and Family: More than three times a week    Frequency of Social Gatherings with Friends and Family: Twice a week    Attends Religious Services: More than 4 times per year    Active Member of [employer] or Organizations: Yes    Attends Banker Meetings: More than 4 times per year    Marital Status: Widowed  Intimate Partner Violence: Not At Risk (08/19/2022)   Humiliation, Afraid, Rape, and Kick questionnaire    Fear of Current or Ex-Partner: No    Emotionally Abused: No    Physically Abused: No    Sexually Abused: No    Family History:  Family History  Problem Relation Age of Onset   Kidney cancer Mother    Diabetes Mother    Obesity Mother    Dementia Mother        died age 72   Other Father        burn victum   Alcoholism Father    Colon cancer Neg Hx    Inflammatory bowel disease Neg Hx    Liver disease Neg Hx    Pancreatic cancer Neg Hx    Stomach cancer Neg Hx    Rectal cancer Neg Hx     Medications:   Current Outpatient Medications on File Prior to Visit  Medication Sig Dispense Refill   EPINEPHrine 0.3 mg/0.3 mL IJ SOAJ injection Inject 0.3 mg into the muscle as needed for anaphylaxis.     escitalopram (LEXAPRO) 10 MG tablet Take 1 tablet (10 mg total) by mouth daily with breakfast. 90 tablet 1   fluticasone (FLONASE) 50 MCG/ACT nasal spray Place into both nostrils.     gabapentin (NEURONTIN) 300 MG capsule TAKE 1 CAPSULE BY MOUTH EVERYDAY AT BEDTIME 90 capsule 0   meclizine (ANTIVERT) 25 MG tablet TAKE 1 TABLET BY MOUTH 3 TIMES DAILY AS NEEDED FOR DIZZINESS. 30 tablet 0   omeprazole (PRILOSEC) 20 MG capsule Take 20 mg by mouth 2 (two) times daily before a meal.     psyllium (REGULOID) 0.52 g capsule Take 0.52 g by mouth daily.     spironolactone (ALDACTONE) 25 MG tablet TAKE 1 TABLET (25 MG TOTAL) BY MOUTH DAILY. 90 tablet 1   traZODone (DESYREL) 50 MG tablet Take 1 tablet (50 mg total) by mouth at bedtime  as needed. for sleep  90 tablet 1   hydrOXYzine (ATARAX) 10 MG tablet Take 1-2 tablets (10-20 mg total) by mouth every 6 (six) hours as needed for itching. (Patient not taking: Reported on 01/10/2023) 20 tablet 0   No current facility-administered medications on file prior to visit.    Allergies:   Allergies  Allergen Reactions   Cephalosporins Hives   Chlorhexidine Hives   Codeine     Other reaction(s): Unknown   Hydrochlorothiazide Hives   Misc. Sulfonamide Containing Compounds Hives   Penicillins Hives   Sulfa Antibiotics Hives   Sulfacetamide Sodium     Other reaction(s): Unknown      OBJECTIVE:  Physical Exam  Vitals:   01/10/23 1334  BP: 115/68  Pulse: 75  Weight: 203 lb (92.1 kg)  Height: 5' 5.5" (1.664 m)   Body mass index is 33.27 kg/m. No results found.   General: well developed, well nourished, very pleasant elderly Caucasian female, seated, in no evident distress Head: head normocephalic and atraumatic.   Neck: supple with no carotid or supraclavicular bruits Cardiovascular: regular rate and rhythm, no murmurs Musculoskeletal: no deformity Skin:  no rash/petichiae Vascular:  Normal pulses all extremities   Neurologic Exam Mental Status: Awake and fully alert. Oriented to place and time. Recent and remote memory intact. Attention span, concentration and fund of knowledge appropriate. Mood and affect appropriate.  Cranial Nerves: Fundoscopic exam reveals sharp disc margins. Pupils equal, briskly reactive to light. Extraocular movements full without nystagmus. Visual fields full to confrontation. Hearing intact. Facial sensation intact. Face, tongue, palate moves normally and symmetrically.  Motor: Normal bulk and tone. Normal strength in all tested extremity muscles Sensory.: intact to touch , pinprick , position and vibratory sensation.  Coordination: Rapid alternating movements normal in all extremities. Finger-to-nose and heel-to-shin performed  accurately bilaterally. Gait and Station: Arises from chair without difficulty. Stance is normal. Gait demonstrates normal stride length and balance without use of AD.  Reflexes: 1+ and symmetric. Toes downgoing.       ASSESSMENT: Cassandra [name] is a 77 y.o. year old female with longstanding history of insomnia.  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 C/o memory loss without significant findings on neurocognitive testing in 10/2022, felt to be more age-related as well as underlying insomnia, anxiety and depression.     PLAN:  Insomnia: Continue trazodone 50mg  nightly, unable to tolerate higher dosage  RLS/PLMD: overall stable currently, felt Mirapex made symptoms worse. If symptoms start to worsen, could consider increasing dose of gabapentin (currently rx'd by Dr. Katrinka Blazing) or trialing Requip. She would like to hold off on dosage adjustment or adding medication at this time.  Advised to continue iron supplement for known iron deficiency possibly contributing to RLS. Continue close PCP follow-up for routine lab work and possible need of GI evaluation but will defer to PCP  Vertigo: Chronic history, associated with hearing loss and tinnitus.  Referral placed to ENT to rule out Mnire's disease.      Follow up in 6 months or call earlier if needed   CC:  PCP: Philip Aspen, Limmie Patricia, MD    I spent 46 minutes of face-to-face and non-face-to-face time with patient.  This included previsit chart review, lab review, study review, order entry, electronic health record documentation, patient education and discussion regarding above diagnoses and treatment plan and answered all other questions to patient's satisfaction  Ihor Austin, AGNP-BC  [facility] [address] [place], [place] [address]  Phone [phone] Fax  [phone] Note: This document was prepared with digital dictation and possible smart phrase technology. Any transcriptional errors that result from this process are unintentional.

## 2023-01-11 ENCOUNTER — Telehealth: Payer: Self-pay | Admitting: Adult Health

## 2023-01-11 NOTE — Telephone Encounter (Signed)
Referral for ENT fax to [facility]. Phone: [phone], Fax\: [phone].

## 2023-01-13 DIAGNOSIS — R3915 Urgency of urination: Secondary | ICD-10-CM | POA: Diagnosis not present

## 2023-01-13 DIAGNOSIS — R350 Frequency of micturition: Secondary | ICD-10-CM | POA: Diagnosis not present

## 2023-01-13 DIAGNOSIS — R1031 Right lower quadrant pain: Secondary | ICD-10-CM | POA: Diagnosis not present

## 2023-01-20 ENCOUNTER — Telehealth: Payer: Self-pay

## 2023-01-20 DIAGNOSIS — K869 Disease of pancreas, unspecified: Secondary | ICD-10-CM

## 2023-01-20 NOTE — Telephone Encounter (Signed)
The pt has been advised and agrees to the MR. Order entered and pt will be called in the next week or so.

## 2023-01-20 NOTE — Telephone Encounter (Signed)
MR order entered and sent to the schedulers   Left message on machine to call back

## 2023-01-20 NOTE — Telephone Encounter (Signed)
-----   Message from Nurse Laberta Wilbon P sent at 01/19/2022 10:30 AM EDT -----  Plan will be a 1 year follow-up MRI/MRCP.

## 2023-01-24 ENCOUNTER — Other Ambulatory Visit: Payer: Self-pay | Admitting: Gastroenterology

## 2023-01-24 ENCOUNTER — Ambulatory Visit (HOSPITAL_COMMUNITY)
Admission: RE | Admit: 2023-01-24 | Discharge: 2023-01-24 | Disposition: A | Discharge status: Home / Self Care | Payer: PPO | Source: Ambulatory Visit | Attending: Gastroenterology | Admitting: Gastroenterology

## 2023-01-24 DIAGNOSIS — K760 Fatty (change of) liver, not elsewhere classified: Secondary | ICD-10-CM | POA: Diagnosis not present

## 2023-01-24 DIAGNOSIS — K869 Disease of pancreas, unspecified: Secondary | ICD-10-CM | POA: Diagnosis not present

## 2023-01-24 DIAGNOSIS — K8020 Calculus of gallbladder without cholecystitis without obstruction: Secondary | ICD-10-CM | POA: Diagnosis not present

## 2023-01-24 MED ORDER — GADOBUTROL 1 MMOL/ML IV SOLN
7.0000 mL | Freq: Once | INTRAVENOUS | Status: AC | PRN
  Administered 2023-01-24: 7 mL via INTRAVENOUS

## 2023-01-24 NOTE — Progress Notes (Unsigned)
[facility] [address] [place] [address] Phone: [phone] Subjective:   Cassandra [name], am serving as a scribe for Dr. Antoine Primas.  I'm seeing this patient by the request  of:  Philip Aspen, Limmie Patricia, MD  CC: Right wrist pain follow-up  [id number]  11/10/2022 Injection given today and tolerated procedure well, we discussed with patient about bracing at night.  Differential includes cervical radiculopathy and we will get x-rays of the neck to further evaluate for any bony abnormality that can be contributing.  Follow-up with me again in 6 to 8 weeks as well.  If patient improves with this can consider potential injection on the contralateral side.      Update 01/24/2023 Cassandra [name] is a 77 y.o. female coming in with complaint of R wrist pain. Patient states that she is having more of the right hip pain issues today. Patient has gotten to the point where she doesn't want to put any weight on that side. Wrist and hand are doing better. Knees depend on the day.       Past Medical History:  Diagnosis Date   Anemia    Anxiety    Asthma    Back pain    BPPV (benign paroxysmal positional vertigo)    Chronic allergic conjunctivitis    Depression    Diverticulosis    Fibromyalgia    Gallbladder problem    Gallstones    GERD (gastroesophageal reflux disease)    Hiatal hernia    History of bladder stone    Iron (Fe) deficiency anemia    Joint pain    Lump in female breast    Mild persistent asthma    followed by pcp--- dr Elvera Lennox. sharma ([place] allergy/asthma)   OA (osteoarthritis)    knees, hands   OAB (overactive bladder)    OSA (obstructive sleep apnea)    per pt has not used cpap several years   Pancreatic cyst    PONV (postoperative nausea and vomiting)    Pre-diabetes    RLS (restless legs syndrome)    Seasonal and perennial allergic rhinitis    SUI (stress urinary incontinence, female)    Swallowing difficulty     Vitamin B12 deficiency    Vitamin D deficiency    Wears glasses    Past Surgical History:  Procedure Laterality Date   BLADDER SUSPENSION  1990's   sling   bladder tacking  2010   with mesh   BREAST LUMPECTOMY Left 1985   Benign cyst   BREAST REDUCTION SURGERY Bilateral 2003 approx.   CORNEA LESION EXCISION Right 2005  approx.   CYSTOSCOPY N/A 07/12/2016   Procedure: CYSTOSCOPY, VAGINOSCOPY, EXAM UNDER ANESTHESIA, STONE LITHOTRIPSY,;  Surgeon: Malen Gauze, MD;  Location: [facility];  Service: Urology;  Laterality: N/A;   CYSTOSCOPY N/A 10/11/2016   Procedure: CYSTOSCOPY;  Surgeon: Malen Gauze, MD;  Location: [facility];  Service: Urology;  Laterality: N/A;   CYSTOSCOPY WITH LITHOLAPAXY N/A 10/11/2016   Procedure: CYSTOSCOPY WITH LITHOLAPAXY/ EXCISION OF MESH;  Surgeon: Malen Gauze, MD;  Location: [facility];  Service: Urology;  Laterality: N/A;   CYSTOSCOPY WITH LITHOLAPAXY N/A 01/22/2019   Procedure: CYSTOSCOPY WITH LITHOLAPAXY;  Surgeon: Malen Gauze, MD;  Location: [facility];  Service: Urology;  Laterality: N/A;  1 HR   CYSTOSCOPY WITH LITHOLAPAXY N/A 08/09/2022   Procedure: CYSTOLITHOLAPAXY;  Surgeon: Jannifer Hick, MD;  Location: [facility];  Service: Urology;  Laterality: N/A;  45 MINUTES NEEDED FOR CASE   HOLMIUM LASER APPLICATION  07/12/2016   Procedure: HOLMIUM LASER APPLICATION;  Surgeon: Malen Gauze, MD;  Location: [facility];  Service: Urology;;   HOLMIUM LASER APPLICATION N/A 01/22/2019   Procedure: HOLMIUM LASER APPLICATION;  Surgeon: Malen Gauze, MD;  Location: [facility];  Service: Urology;  Laterality: N/A;   RIGHT KNEE ARTHROSCOPY  2013   TOTAL KNEE ARTHROPLASTY Right 09/12/2012   Procedure: RIGHT TOTAL KNEE ARTHROPLASTY;  Surgeon: Loanne Drilling, MD;  Location: [facility];  Service: Orthopedics;  Laterality: Right;    VAGINAL HYSTERECTOMY  1984   Social History   Socioeconomic History   Marital status: Widowed    Spouse name: Not on file   Number of children: 2   Years of education: Not on file   Highest education level: Bachelor's degree (e.g., BA, AB, BS)  Occupational History   Occupation: Engineer, technical sales Retired  Tobacco Use   Smoking status: Never   Smokeless tobacco: Never  Vaping Use   Vaping status: Never Used  Substance and Sexual Activity   Alcohol use: Yes    Comment: 1x monthly   Drug use: Never   Sexual activity: Not on file  Other Topics Concern   Not on file  Social History Narrative   Lives alone   R handed   Caffeine: 1 C a day   Social Determinants of Health   Financial Resource Strain: Low Risk  (08/19/2022)   Overall Financial Resource Strain (CARDIA)    Difficulty of Paying Living Expenses: Not hard at all  Food Insecurity: No Food Insecurity (08/19/2022)   Hunger Vital Sign    Worried About Running Out of Food in the Last Year: Never true    Ran Out of Food in the Last Year: Never true  Transportation Needs: No Transportation Needs (08/19/2022)   PRAPARE - Administrator, Civil Service (Medical): No    Lack of Transportation (Non-Medical): No  Physical Activity: Sufficiently Active (08/19/2022)   Exercise Vital Sign    Days of Exercise per Week: 7 days    Minutes of Exercise per Session: 40 min  Stress: No Stress Concern Present (08/19/2022)   [employer] of Occupational Health - Occupational Stress Questionnaire    Feeling of Stress : Not at all  Social Connections: Moderately Integrated (08/19/2022)   Social Connection and Isolation Panel [NHANES]    Frequency of Communication with Friends and Family: More than three times a week    Frequency of Social Gatherings with Friends and Family: Twice a week    Attends Religious Services: More than 4 times per year    Active Member of [employer] or Organizations: Yes    Attends Tax inspector Meetings: More than 4 times per year    Marital Status: Widowed   Allergies  Allergen Reactions   Cephalosporins Hives   Chlorhexidine Hives   Codeine     Other reaction(s): Unknown   Hydrochlorothiazide Hives   Misc. Sulfonamide Containing Compounds Hives   Penicillins Hives   Sulfa Antibiotics Hives   Sulfacetamide Sodium     Other reaction(s): Unknown   Family History  Problem Relation Age of Onset   Kidney cancer Mother    Diabetes Mother    Obesity Mother    Dementia Mother        died age 48   Other Father  burn victum   Alcoholism Father    Colon cancer Neg Hx    Inflammatory bowel disease Neg Hx    Liver disease Neg Hx    Pancreatic cancer Neg Hx    Stomach cancer Neg Hx    Rectal cancer Neg Hx      Current Outpatient Medications (Cardiovascular):    EPINEPHrine 0.3 mg/0.3 mL IJ SOAJ injection, Inject 0.3 mg into the muscle as needed for anaphylaxis.   spironolactone (ALDACTONE) 25 MG tablet, TAKE 1 TABLET (25 MG TOTAL) BY MOUTH DAILY.  Current Outpatient Medications (Respiratory):    fluticasone (FLONASE) 50 MCG/ACT nasal spray, Place into both nostrils.    Current Outpatient Medications (Other):    escitalopram (LEXAPRO) 10 MG tablet, Take 1 tablet (10 mg total) by mouth daily with breakfast.   gabapentin (NEURONTIN) 300 MG capsule, TAKE 1 CAPSULE BY MOUTH EVERYDAY AT BEDTIME   meclizine (ANTIVERT) 25 MG tablet, TAKE 1 TABLET BY MOUTH 3 TIMES DAILY AS NEEDED FOR DIZZINESS.   omeprazole (PRILOSEC) 20 MG capsule, Take 20 mg by mouth 2 (two) times daily before a meal.   psyllium (REGULOID) 0.52 g capsule, Take 0.52 g by mouth daily.   traZODone (DESYREL) 50 MG tablet, Take 1 tablet (50 mg total) by mouth at bedtime as needed. for sleep   Reviewed prior external information including notes and imaging from  primary care provider As well as notes that were available from care everywhere and other healthcare systems.  Past medical  history, social, surgical and family history all reviewed in electronic medical record.  No pertanent information unless stated regarding to the chief complaint.   Review of Systems:  No headache, visual changes, nausea, vomiting, diarrhea, constipation, dizziness, abdominal pain, skin rash, fevers, chills, night sweats, weight loss, swollen lymph nodes, body aches, joint swelling, chest pain, shortness of breath, mood changes. POSITIVE muscle aches  Objective  Blood pressure 110/70, pulse 77, height 5' 5.5" (1.664 m), weight 204 lb (92.5 kg), SpO2 96%.   General: No apparent distress alert and oriented x3 mood and affect normal, dressed appropriately.  HEENT: Pupils equal, extraocular movements intact  Respiratory: Patient's speak in full sentences and does not appear short of breath  Cardiovascular: No lower extremity edema, non tender, no erythema  Right wrist exam shows the patient has good range of motion.  Knees do have crepitus on the left knee with some instability.  No severe tenderness over the greater trochanteric area on the right side.    After verbal consent patient was prepped with alcohol swab and with a 21-gauge 2 inch needle injected into the right greater trochanteric area with 2 cc of 0.5% Marcaine and 1 cc of Kenalog 40 mg/mL.  No blood loss.  Band-Aid placed.  Postinjection instructions given    Impression and Recommendations:    The above documentation has been reviewed and is accurate and complete Judi Saa, DO

## 2023-01-25 ENCOUNTER — Ambulatory Visit (INDEPENDENT_AMBULATORY_CARE_PROVIDER_SITE_OTHER): Payer: PPO | Admitting: Family Medicine

## 2023-01-25 ENCOUNTER — Encounter: Payer: Self-pay | Admitting: Family Medicine

## 2023-01-25 VITALS — BP 110/70 | HR 77 | Ht 65.5 in | Wt 204.0 lb

## 2023-01-25 DIAGNOSIS — M25551 Pain in right hip: Secondary | ICD-10-CM

## 2023-01-25 NOTE — Assessment & Plan Note (Signed)
Chronic, with some underlying arthritic changes noted of the hip joint previously.  Will repeat x-rays to further evaluate with patient having worsening pain again.  Discussed icing regimen and home exercises.  Encouraged her to continue to lose weight which she has done relatively well.  Discussed icing regimen.  Discussed core strengthening.  Follow-up again in 6 to 8 weeks

## 2023-01-25 NOTE — Patient Instructions (Addendum)
Good to see you  Injection given today  [place] fun [place] the beach Follow up in 2-3 months

## 2023-02-09 ENCOUNTER — Ambulatory Visit: Payer: PPO | Admitting: Family Medicine

## 2023-02-23 ENCOUNTER — Encounter: Payer: Self-pay | Admitting: Internal Medicine

## 2023-02-26 ENCOUNTER — Other Ambulatory Visit: Payer: Self-pay | Admitting: Internal Medicine

## 2023-02-26 DIAGNOSIS — F3289 Other specified depressive episodes: Secondary | ICD-10-CM

## 2023-03-09 ENCOUNTER — Ambulatory Visit (INDEPENDENT_AMBULATORY_CARE_PROVIDER_SITE_OTHER): Payer: PPO | Admitting: Family Medicine

## 2023-03-09 ENCOUNTER — Encounter (INDEPENDENT_AMBULATORY_CARE_PROVIDER_SITE_OTHER): Payer: Self-pay | Admitting: Family Medicine

## 2023-03-09 VITALS — BP 95/60 | HR 58 | Temp 97.7°F | Ht 66.0 in | Wt 196.0 lb

## 2023-03-09 DIAGNOSIS — E669 Obesity, unspecified: Secondary | ICD-10-CM

## 2023-03-09 DIAGNOSIS — R300 Dysuria: Secondary | ICD-10-CM

## 2023-03-09 DIAGNOSIS — Z6831 Body mass index (BMI) 31.0-31.9, adult: Secondary | ICD-10-CM

## 2023-03-09 NOTE — Progress Notes (Unsigned)
Chief Complaint:   OBESITY Cassandra [name] is here to discuss her progress with her obesity treatment plan along with follow-up of her obesity related diagnoses. Cassandra [name] is on practicing portion control and making smarter food choices, such as increasing vegetables and decreasing simple carbohydrates and states she is following her eating plan approximately 0% of the time. Cassandra [name] states she is taking an exercise class for 60 minutes 1 time per week.  Today's visit was #: 34 Starting weight: 193 lbs Starting date: 11/13/2019 Today's weight: 196 lbs Today's date: 03/09/2023 Total lbs lost to date: 0 Total lbs lost since last in-office visit: 4  Interim History: Patient continues to do well with her weight loss.  Her hunger is controlled and she is exercising well.  She is trying to increase her protein and decrease simple carbohydrates.  Subjective:   1. Dysuria Patient notes difficulty with urinating.  She was tested for bladder stones and urinary tract infection, but she was told her results were negative.  Assessment/Plan:   1. Dysuria Patient is to follow-up with her GYN, and she is okay to try to stop Xyzal to see if this makes a difference.  2. BMI 31.0-31.9,adult  3. Obesity, Beginning BMI 32.12 Cassandra [name] is currently in the action stage of change. As such, her goal is to continue with weight loss efforts. She has agreed to practicing portion control and making smarter food choices, such as increasing vegetables and decreasing simple carbohydrates.   Exercise goals: As is.   Behavioral modification strategies: no skipping meals.  Cassandra [name] has agreed to follow-up with our clinic in 4 weeks. She was informed of the importance of frequent follow-up visits to maximize her success with intensive lifestyle modifications for her multiple health conditions.   Objective:   Blood pressure 95/60, pulse (!) 58, temperature 97.7 F (36.5 C), height 5\' 6"  (1.676 m), weight 196 lb (88.9 kg), SpO2  98%. Body mass index is 31.64 kg/m.  Lab Results  Component Value Date   CREATININE 0.77 08/19/2022   BUN 13 08/19/2022   NA 139 08/19/2022   K 4.3 08/19/2022   CL 102 08/19/2022   CO2 29 08/19/2022   Lab Results  Component Value Date   ALT 6 08/19/2022   AST 20 08/19/2022   ALKPHOS 84 08/19/2022   BILITOT 0.4 08/19/2022   Lab Results  Component Value Date   HGBA1C 6.3 08/19/2022   HGBA1C 5.9 (H) 05/11/2022   HGBA1C 5.7 (H) 01/13/2022   HGBA1C 5.6 04/17/2020   HGBA1C 5.7 (H) 11/13/2019   Lab Results  Component Value Date   INSULIN 22.3 05/11/2022   INSULIN 20.0 01/13/2022   INSULIN 24.4 07/21/2021   INSULIN 19.1 11/26/2020   INSULIN 15.9 04/17/2020   Lab Results  Component Value Date   TSH 0.84 08/19/2022   Lab Results  Component Value Date   CHOL 160 08/19/2022   HDL 59.40 08/19/2022   LDLCALC 82 08/19/2022   TRIG 92.0 08/19/2022   CHOLHDL 3 08/19/2022   Lab Results  Component Value Date   VD25OH 57.07 08/19/2022   VD25OH 45.8 05/11/2022   VD25OH 55.1 01/13/2022   Lab Results  Component Value Date   WBC 6.1 12/07/2022   HGB 13.4 12/07/2022   HCT 42.4 12/07/2022   MCV 84.4 12/07/2022   PLT 244.0 12/07/2022   Lab Results  Component Value Date   IRON 67 12/07/2022   TIBC 501.2 (H) 12/07/2022   FERRITIN 12.0 12/07/2022   Attestation Statements:  Reviewed by clinician on day of visit: allergies, medications, problem list, medical history, surgical history, family history, social history, and previous encounter notes.  Time spent on visit including pre-visit chart review and post-visit care and charting was 30 minutes.   I, Burt Knack, am acting as transcriptionist for Quillian Quince, MD.  I have reviewed the above documentation for accuracy and completeness, and I agree with the above. -  Quillian Quince, MD

## 2023-03-15 ENCOUNTER — Encounter: Payer: PPO | Attending: Psychology | Admitting: Psychology

## 2023-03-15 DIAGNOSIS — R4189 Other symptoms and signs involving cognitive functions and awareness: Secondary | ICD-10-CM | POA: Diagnosis not present

## 2023-03-15 DIAGNOSIS — F5101 Primary insomnia: Secondary | ICD-10-CM | POA: Diagnosis not present

## 2023-03-15 DIAGNOSIS — F329 Major depressive disorder, single episode, unspecified: Secondary | ICD-10-CM | POA: Diagnosis not present

## 2023-03-15 DIAGNOSIS — F419 Anxiety disorder, unspecified: Secondary | ICD-10-CM | POA: Insufficient documentation

## 2023-03-28 ENCOUNTER — Other Ambulatory Visit: Payer: Self-pay | Admitting: Family Medicine

## 2023-03-29 ENCOUNTER — Ambulatory Visit: Payer: PPO | Admitting: Family Medicine

## 2023-04-05 NOTE — Telephone Encounter (Signed)
Austin from [employer] [facility] called stating that they would need a new outgoing referral sent for this patient. The previous outgoing referral that someone from out office inquired about has expired. They would like a new referral sent over.

## 2023-04-26 DIAGNOSIS — N3281 Overactive bladder: Secondary | ICD-10-CM | POA: Diagnosis not present

## 2023-04-26 DIAGNOSIS — Z1272 Encounter for screening for malignant neoplasm of vagina: Secondary | ICD-10-CM | POA: Diagnosis not present

## 2023-04-26 DIAGNOSIS — Z6833 Body mass index (BMI) 33.0-33.9, adult: Secondary | ICD-10-CM | POA: Diagnosis not present

## 2023-04-26 DIAGNOSIS — Z124 Encounter for screening for malignant neoplasm of cervix: Secondary | ICD-10-CM | POA: Diagnosis not present

## 2023-04-26 DIAGNOSIS — N952 Postmenopausal atrophic vaginitis: Secondary | ICD-10-CM | POA: Diagnosis not present

## 2023-04-27 NOTE — Progress Notes (Unsigned)
[facility] [address] [place] [address] Phone: [phone] Subjective:   [name], am serving as a scribe for Dr. Antoine Primas.  I'm seeing this patient by the request  of:  Philip Aspen, Limmie Patricia, MD  CC: Right hip and knee pain follow-up  [id number]  01/25/2023 Chronic, with some underlying arthritic changes noted of the hip joint previously.  Will repeat x-rays to further evaluate with patient having worsening pain again.  Discussed icing regimen and home exercises.  Encouraged her to continue to lose weight which she has done relatively well.  Discussed icing regimen.  Discussed core strengthening.  Follow-up again in 6 to 8 weeks   Updated 05/04/2023 CRYSTALYN THAMES is a 77 y.o. female coming in with complaint of R hip pain. Patient states that she continues to have pain over the GT. Patient fell down the stairs a day after she was last here so she is unsure if the injection worked since she fell down. Larey Seat due to vertigo.   Trigger finger triggers occasionally.          Past Medical History:  Diagnosis Date   Anemia    Anxiety    Asthma    Back pain    BPPV (benign paroxysmal positional vertigo)    Chronic allergic conjunctivitis    Depression    Diverticulosis    Fibromyalgia    Gallbladder problem    Gallstones    GERD (gastroesophageal reflux disease)    Hiatal hernia    History of bladder stone    Iron (Fe) deficiency anemia    Joint pain    Lump in female breast    Mild persistent asthma    followed by pcp--- dr Elvera Lennox. sharma (Pelzer allergy/asthma)   OA (osteoarthritis)    knees, hands   OAB (overactive bladder)    OSA (obstructive sleep apnea)    per pt has not used cpap several years   Pancreatic cyst    PONV (postoperative nausea and vomiting)    Pre-diabetes    RLS (restless legs syndrome)    Seasonal and perennial allergic rhinitis    SUI (stress urinary incontinence, female)    Swallowing  difficulty    Vitamin B12 deficiency    Vitamin D deficiency    Wears glasses    Past Surgical History:  Procedure Laterality Date   BLADDER SUSPENSION  1990's   sling   bladder tacking  2010   with mesh   BREAST LUMPECTOMY Left 1985   Benign cyst   BREAST REDUCTION SURGERY Bilateral 2003 approx.   CORNEA LESION EXCISION Right 2005  approx.   CYSTOSCOPY N/A 07/12/2016   Procedure: CYSTOSCOPY, VAGINOSCOPY, EXAM UNDER ANESTHESIA, STONE LITHOTRIPSY,;  Surgeon: Malen Gauze, MD;  Location: [facility];  Service: Urology;  Laterality: N/A;   CYSTOSCOPY N/A 10/11/2016   Procedure: CYSTOSCOPY;  Surgeon: Malen Gauze, MD;  Location: [facility];  Service: Urology;  Laterality: N/A;   CYSTOSCOPY WITH LITHOLAPAXY N/A 10/11/2016   Procedure: CYSTOSCOPY WITH LITHOLAPAXY/ EXCISION OF MESH;  Surgeon: Malen Gauze, MD;  Location: [facility];  Service: Urology;  Laterality: N/A;   CYSTOSCOPY WITH LITHOLAPAXY N/A 01/22/2019   Procedure: CYSTOSCOPY WITH LITHOLAPAXY;  Surgeon: Malen Gauze, MD;  Location: [facility];  Service: Urology;  Laterality: N/A;  1 HR   CYSTOSCOPY WITH LITHOLAPAXY N/A 08/09/2022   Procedure: CYSTOLITHOLAPAXY;  Surgeon: Jannifer Hick,  MD;  Location: [facility];  Service: Urology;  Laterality: N/A;  45 MINUTES NEEDED FOR CASE   HOLMIUM LASER APPLICATION  07/12/2016   Procedure: HOLMIUM LASER APPLICATION;  Surgeon: Malen Gauze, MD;  Location: [facility];  Service: Urology;;   HOLMIUM LASER APPLICATION N/A 01/22/2019   Procedure: HOLMIUM LASER APPLICATION;  Surgeon: Malen Gauze, MD;  Location: [facility];  Service: Urology;  Laterality: N/A;   RIGHT KNEE ARTHROSCOPY  2013   TOTAL KNEE ARTHROPLASTY Right 09/12/2012   Procedure: RIGHT TOTAL KNEE ARTHROPLASTY;  Surgeon: Loanne Drilling, MD;  Location: [facility];  Service: Orthopedics;  Laterality:  Right;   VAGINAL HYSTERECTOMY  1984   Social History   Socioeconomic History   Marital status: Widowed    Spouse name: Not on file   Number of children: 2   Years of education: Not on file   Highest education level: Bachelor's degree (e.g., BA, AB, BS)  Occupational History   Occupation: Engineer, technical sales Retired  Tobacco Use   Smoking status: Never   Smokeless tobacco: Never  Vaping Use   Vaping status: Never Used  Substance and Sexual Activity   Alcohol use: Yes    Comment: 1x monthly   Drug use: Never   Sexual activity: Not on file  Other Topics Concern   Not on file  Social History Narrative   Lives alone   R handed   Caffeine: 1 C a day   Social Determinants of Health   Financial Resource Strain: Low Risk  (08/19/2022)   Overall Financial Resource Strain (CARDIA)    Difficulty of Paying Living Expenses: Not hard at all  Food Insecurity: No Food Insecurity (08/19/2022)   Hunger Vital Sign    Worried About Running Out of Food in the Last Year: Never true    Ran Out of Food in the Last Year: Never true  Transportation Needs: No Transportation Needs (08/19/2022)   PRAPARE - Administrator, Civil Service (Medical): No    Lack of Transportation (Non-Medical): No  Physical Activity: Sufficiently Active (08/19/2022)   Exercise Vital Sign    Days of Exercise per Week: 7 days    Minutes of Exercise per Session: 40 min  Stress: No Stress Concern Present (08/19/2022)   [employer] of Occupational Health - Occupational Stress Questionnaire    Feeling of Stress : Not at all  Social Connections: Moderately Integrated (08/19/2022)   Social Connection and Isolation Panel [NHANES]    Frequency of Communication with Friends and Family: More than three times a week    Frequency of Social Gatherings with Friends and Family: Twice a week    Attends Religious Services: More than 4 times per year    Active Member of [employer] or Organizations: Yes    Attends Occupational hygienist Meetings: More than 4 times per year    Marital Status: Widowed   Allergies  Allergen Reactions   Cephalosporins Hives   Chlorhexidine Hives   Codeine     Other reaction(s): Unknown   Hydrochlorothiazide Hives   Misc. Sulfonamide Containing Compounds Hives   Penicillins Hives   Sulfa Antibiotics Hives   Sulfacetamide Sodium     Other reaction(s): Unknown   Family History  Problem Relation Age of Onset   Kidney cancer Mother    Diabetes Mother    Obesity Mother    Dementia Mother        died age 54  Other Father        burn victum   Alcoholism Father    Colon cancer Neg Hx    Inflammatory bowel disease Neg Hx    Liver disease Neg Hx    Pancreatic cancer Neg Hx    Stomach cancer Neg Hx    Rectal cancer Neg Hx      Current Outpatient Medications (Cardiovascular):    EPINEPHrine 0.3 mg/0.3 mL IJ SOAJ injection, Inject 0.3 mg into the muscle as needed for anaphylaxis.   spironolactone (ALDACTONE) 25 MG tablet, TAKE 1 TABLET (25 MG TOTAL) BY MOUTH DAILY.  Current Outpatient Medications (Respiratory):    fluticasone (FLONASE) 50 MCG/ACT nasal spray, Place into both nostrils.    Current Outpatient Medications (Other):    escitalopram (LEXAPRO) 10 MG tablet, TAKE 1 TABLET (10 MG TOTAL) BY MOUTH DAILY WITH BREAKFAST.   gabapentin (NEURONTIN) 300 MG capsule, TAKE 1 CAPSULE BY MOUTH EVERYDAY AT BEDTIME   meclizine (ANTIVERT) 25 MG tablet, TAKE 1 TABLET BY MOUTH 3 TIMES DAILY AS NEEDED FOR DIZZINESS.   mirabegron ER (MYRBETRIQ) 25 MG TB24 tablet, Take 25 mg by mouth daily.   omeprazole (PRILOSEC) 20 MG capsule, Take 20 mg by mouth 2 (two) times daily before a meal.   psyllium (REGULOID) 0.52 g capsule, Take 0.52 g by mouth daily.   traZODone (DESYREL) 50 MG tablet, Take 1 tablet (50 mg total) by mouth at bedtime as needed. for sleep   Reviewed prior external information including notes and imaging from  primary care provider As well as notes that were  available from care everywhere and other healthcare systems.  Past medical history, social, surgical and family history all reviewed in electronic medical record.  No pertanent information unless stated regarding to the chief complaint.   Review of Systems:  No headache, visual changes, nausea, vomiting, diarrhea, constipation, dizziness, abdominal pain, skin rash, fevers, chills, night sweats, weight loss, swollen lymph nodes, body aches, joint swelling, chest pain, shortness of breath, mood changes. POSITIVE muscle aches  Objective  Blood pressure 110/72, pulse 73, height 5\' 6"  (1.676 m), weight 204 lb (92.5 kg), SpO2 96%.   General: No apparent distress alert and oriented x3 mood and affect normal, dressed appropriately.  HEENT: Pupils equal, extraocular movements intact  Respiratory: Patient's speak in full sentences and does not appear short of breath  Cardiovascular: No lower extremity edema, non tender, no erythema  Patient back exam does have some loss of lordosis noted.  Severe tenderness noted to palpation over the greater trochanteric area on the right side in the gluteal medius insertion in this area.  Positive Pearlean Brownie.  Negative straight leg test. Knee still have arthritic changes but no significant swelling.   After verbal consent patient was prepped with alcohol swab and with a 21-gauge 2 inch needle injected into the right greater trochanteric area with 2 cc of 0.5% Marcaine and 1 cc of Kenalog 40 mg/mL.  No blood loss.  Band-Aid placed.  Postinjection instructions given    Impression and Recommendations:     The above documentation has been reviewed and is accurate and complete Judi Saa, DO

## 2023-04-29 ENCOUNTER — Encounter: Payer: Self-pay | Admitting: Psychology

## 2023-04-29 NOTE — Progress Notes (Signed)
Neuropsychological Evaluation   Patient:  Cassandra [name]   DOB: 02-26-1946  MR Number: [id number]  Location: [facility] A DEPT OF [facility] [address] [place], [place] [address] [place] [address] Dept: [phone]  Start: 11 AM End: 12 PM  Provider/Observer:     Cassandra Coria PsyD  Chief Complaint:      Chief Complaint  Patient presents with   Memory Loss   Hearing Loss   Anxiety   Other    Word finding difficulty   03/15/2023: Today I provided feedback regarding the results of the recent neuropsychological evaluation.  We went over the diagnostic considerations and treatment recommendations with in-depth recommendations for the patient herself.  I have included a copy for the reason for service and the impression/summary below for convenience and the patient's complete neuropsychological evaluation can be found in her EMR dated 11/02/2022 with full background and clinical information being found in her EMR dated 08/05/2022   Reason For Service:      Cassandra [name] is a 77 year old female referred for neuropsychological evaluation by her treating neurologist Cassandra Novas, MD, due to reports of increasing memory difficulties and word finding difficulties.  Patient has had a history of significant insomnia with various medication attempts.  She was diagnosed with obstructive sleep apnea back around 2009 but struggled with CPAP machine.  She had a more recent sleep study conducted by Cassandra. Vickey [name], where there was no observed evidence of active obstructive sleep apnea on sleep study.  Patient has a history of hyperactivity, impulsivity and anxiety, which her neurologist felt were likely playing an interaction with her insomnia both contributing to insomnia and insomnia contributing to these other attentional and anxiety type symptoms.  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  Patient has also been diagnosed with restless leg syndrome, insomnia, hyperlipidemia.  Anxiety and depressive symptoms are noted as well.   The patient reports that she is having more memory issues and difficulty keeping up with daily objects.  Patient reports that she is not particularly distressed by this and feels like losing things is just a normal event and common with age changes.   The patient reports that she had had 2 previous sleep studies with most recent not identifying obstructive sleep apnea.  Patient reports that she has a nearly lifelong history of difficulty going to sleep and staying asleep.  She describes significant vertigo and dizziness, hearing loss, anxiety and restless leg syndrome.  She describes significant balance issues and nausea.  Patient reports that she wakes up every hour and has difficulty going back to sleep.   Patient reports significant change in her appetite and that she essentially has "no desire to eat" and has had metabolic imbalance around glucose and insulin response.   Patient reports that her mother developed significant memory deficits around 23 to 76 years of age and was diagnosed with dementia.  She is unsure if there was ever a more definitive description of her mother's dementia.  The patient does have a history of coping with mild to moderate depression and anxiety symptoms. She continues to take Lexapro for the symptoms and also takes trazodone at night to aid with sleep.   Patient denies any visual or  auditory hallucinations, tremors and denies any particular changes in geographic orientation or visual spatial changes.   Medical History:                         Past Medical History:  Diagnosis Date   Anemia     Anxiety     Asthma      Back pain     BPPV (benign paroxysmal positional vertigo)     Chronic allergic conjunctivitis     Depression     Diverticulosis     Fibromyalgia     Gallbladder problem     Gallstones     GERD (gastroesophageal reflux disease)     Hiatal hernia     History of bladder stone     Iron (Fe) deficiency anemia     Joint pain     Lump in female breast     Mild persistent asthma      followed by pcp--- Cassandra [name] ([place] allergy/asthma)   OA (osteoarthritis)      knees, hands   OAB (overactive bladder)     OSA (obstructive sleep apnea)      per pt has not used cpap several years   Pancreatic cyst     PONV (postoperative nausea and vomiting)     Pre-diabetes     RLS (restless legs syndrome)     Seasonal and perennial allergic rhinitis     SUI (stress urinary incontinence, female)     Swallowing difficulty     Vitamin B12 deficiency     Vitamin D deficiency     Wears glasses                                                             Patient Active Problem List    Diagnosis Date Noted   Microcytic anemia 08/31/2022   Arterial hypotension 08/24/2022   BMI 33.0-33.9,adult 06/22/2022   AC (acromioclavicular) arthritis 06/02/2022   Acquired trigger finger of left ring finger 06/02/2022   Vitamin D deficiency 01/13/2022   Other fatigue 12/15/2021   Psychophysiological insomnia 12/15/2021   Inadequate sleep hygiene 12/15/2021   History of sleep apnea 12/15/2021   Anxiety 12/15/2021   Class 1 obesity due to excess calories without serious comorbidity with body mass index (BMI) of 33.0 to 33.9 in adult 12/15/2021   Insulin resistance 10/20/2021   Hyperlipidemia 10/20/2021   Depression 10/20/2021   Pancreatic lesion 01/30/2021   Cholelithiasis 01/30/2021   RUQ pain 01/30/2021   LUQ pain 09/15/2020   Calculus of gallbladder without cholecystitis without obstruction 09/15/2020   Hamstring tendinitis 09/10/2020   Pyrosis 07/21/2020   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   Right hip pain 06/12/2020   Pre-diabetes 03/12/2020   Seasonal allergies 03/12/2020   Generalized obesity 10/30/2019   Low back pain 09/11/2019   Degenerative joint disease of knee, left 09/11/2019   Pancreatic cyst 01/25/2019   Gastroesophageal reflux disease 01/25/2019   Esophageal dysphagia 01/25/2019   Unintentional weight loss 01/25/2019   IGT (impaired glucose tolerance) 01/17/2019   BPPV (benign paroxysmal positional vertigo) 10/23/2012   Vertigo 10/21/2012   UTI (lower urinary tract infection) 10/21/2012   Nausea & vomiting 10/21/2012   Postoperative anemia due to acute blood loss 09/14/2012  OA (osteoarthritis) of knee 09/12/2012   Insomnia 08/20/2011   OBSTRUCTIVE SLEEP APNEA 04/23/2008   RESTLESS LEG SYNDROME 04/23/2008   FIBROMYALGIA 01/06/2007    Progression of Symptoms: Patient reports that she has experienced a worsening of her cognitive efficiency but tends to downplay it significant impact on her life.   Additional Tests and Measures from other records:   Neuroimaging Results: The only neuroimaging studies I was able to find in her EMR was an MRI head without contrast performed on 10/23/2012.  This was requested due to severe vertigo and concerned about posterior circulation stroke/issues.  The interpretation of the MRI was one of the normal MRI of the brain for age with a few white matter foci not likely of clinical significance and no evidence of prior posterior circulation insult/stroke.  The few small foci of T2 and FLAIR signal were noted in frontal white matter regions and not felt to be clinically significant.   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  Patient reports that she wakes up almost every hour and has difficulty going back to sleep.  Patient was diagnosed with obstructive sleep apnea back in 2009 but most recent sleep study found no  indications of obstructive sleep apnea.  The patient is on no CPAP assistive devices.   Impression/Diagnosis:   Overall, the results of the current neuropsychological evaluation are quite encouraging.  There was no pattern suggesting any type of progressive neurological disorder with the patient performing globally on cognitive measures only slightly below levels of predicted premorbid capacity.  The patient displayed some very mild weakness with regard to visual encoding capacity and auditory encoding capacity and attention were within normal limits.  The patient's visual spatial and language-based skills appear to be well-maintained with no significant change.  Visual and verbal reasoning and problem-solving abilities appear to be intact.  There were no indications of any visual spatial or visual processing deficits noted.  The patient had excellent performance with regard to measures of information processing speed and focus execute abilities and showed good mental flexibility and adaptation capacity.  While auditory and visual memory were slightly below levels of predicted premorbid functioning they still did not suggest any significant cognitive loss.  There was no difference between auditory versus visual memory and the patient was able to adequately encode and process new information, store and organize new information, and retain information over period of delay.  This was equally true for both auditory and visual information.  There were no indications of expressive or receptive language deficits and motor functioning and gait were all observed as being normal.  Patient denies any visual or auditory hallucinations, tremors or other concerning symptoms.  Patient denies any geographic disorientation as well.  As far as diagnostic considerations, the obtain results on neuropsychological evaluation are very encouraging and are not indicative of any significant progressive neurological disorder.  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  The patient has had previous studies indicating obstructive sleep apnea but most recent sleep study did not identify any significant apneic event.  I would keep an eye on some of these particular features over time to make sure we do not have a reoccurrence  of the symptoms down the road as they would have a further negative impact on her memory and attention.  The patient does have a history of depression and anxiety and she continues to take Lexapro without apparent significant side effects and the patient takes trazodone at night to aid with sleep.  I will sit down with the patient and go over the results of the current neuropsychological evaluation and focus on some specific areas around strategies to positively impact her sleep hygiene.  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  The patient has been diagnosed with prediabetes and careful adherence to a good dietary pattern and metabolic balance will also be important.  The patient's significant strength and information processing speed is not consistent with significant or widespread microvascular ischemic changes.  Diagnosis:    Major depressive disorder, remission status unspecified, unspecified whether recurrent  Subjective memory complaints  Primary insomnia  Anxiety   _____________________ Arley Phenix, Psy.D. Clinical Neuropsychologist

## 2023-05-03 ENCOUNTER — Ambulatory Visit: Payer: PPO | Admitting: Psychology

## 2023-05-04 ENCOUNTER — Ambulatory Visit: Payer: PPO | Admitting: Family Medicine

## 2023-05-04 VITALS — BP 110/72 | HR 73 | Ht 66.0 in | Wt 204.0 lb

## 2023-05-04 DIAGNOSIS — M25551 Pain in right hip: Secondary | ICD-10-CM

## 2023-05-04 DIAGNOSIS — M1712 Unilateral primary osteoarthritis, left knee: Secondary | ICD-10-CM

## 2023-05-04 NOTE — Patient Instructions (Addendum)
Injected GT today Happy Holidays See me in 2 months

## 2023-05-05 ENCOUNTER — Encounter: Payer: Self-pay | Admitting: Family Medicine

## 2023-05-05 NOTE — Assessment & Plan Note (Signed)
Stable at the moment we will hold off on any other injections.  Increase activity slowly.  Follow-up again in 6 to 8 weeks

## 2023-05-05 NOTE — Assessment & Plan Note (Signed)
Chronic problem with exacerbation again.  Will try the injection today to see if we could alleviate this.  More posteriorly than previous injection.  Discussed different things such as over-the-counter medications including ibuprofen 400 mg up to 3 times a day if needed.  Also discussed patient's gabapentin at 300 mg at night.  Patient will increase activity as tolerated and continue to work on hip abductor strengthening.  Follow-up again in 6 to 8 weeks.

## 2023-05-09 ENCOUNTER — Ambulatory Visit: Payer: PPO | Admitting: Psychology

## 2023-05-18 ENCOUNTER — Other Ambulatory Visit: Payer: Self-pay | Admitting: Internal Medicine

## 2023-05-18 ENCOUNTER — Other Ambulatory Visit: Payer: Self-pay | Admitting: Neurology

## 2023-05-18 DIAGNOSIS — R42 Dizziness and giddiness: Secondary | ICD-10-CM

## 2023-06-07 ENCOUNTER — Ambulatory Visit (INDEPENDENT_AMBULATORY_CARE_PROVIDER_SITE_OTHER): Payer: PPO | Admitting: Family Medicine

## 2023-06-07 ENCOUNTER — Encounter (INDEPENDENT_AMBULATORY_CARE_PROVIDER_SITE_OTHER): Payer: Self-pay | Admitting: Family Medicine

## 2023-06-07 VITALS — BP 99/62 | HR 78 | Temp 98.5°F | Ht 66.0 in | Wt 198.0 lb

## 2023-06-07 DIAGNOSIS — E559 Vitamin D deficiency, unspecified: Secondary | ICD-10-CM

## 2023-06-07 DIAGNOSIS — Z6831 Body mass index (BMI) 31.0-31.9, adult: Secondary | ICD-10-CM

## 2023-06-07 DIAGNOSIS — E538 Deficiency of other specified B group vitamins: Secondary | ICD-10-CM | POA: Insufficient documentation

## 2023-06-07 DIAGNOSIS — Z6832 Body mass index (BMI) 32.0-32.9, adult: Secondary | ICD-10-CM

## 2023-06-07 DIAGNOSIS — R7303 Prediabetes: Secondary | ICD-10-CM | POA: Diagnosis not present

## 2023-06-07 DIAGNOSIS — K862 Cyst of pancreas: Secondary | ICD-10-CM | POA: Diagnosis not present

## 2023-06-07 DIAGNOSIS — E669 Obesity, unspecified: Secondary | ICD-10-CM | POA: Diagnosis not present

## 2023-06-07 MED ORDER — METFORMIN HCL 500 MG PO TABS: 500.0000 mg | ORAL_TABLET | Freq: Two times a day (BID) | ORAL | 0 refills | Status: DC

## 2023-06-07 NOTE — Progress Notes (Signed)
 .smr  Office: [phone]  /  Fax: [phone]  WEIGHT SUMMARY AND BIOMETRICS  Anthropometric Measurements Height: 5' 6 (1.676 m) Weight: 198 lb (89.8 kg) BMI (Calculated): 31.97 Weight at Last Visit: 196 lb Weight Lost Since Last Visit: 0 Weight Gained Since Last Visit: 2 lb Starting Weight: 193 lb Total Weight Loss (lbs): 0 lb (0 kg)   Body Composition  Body Fat %: 43.7 % Fat Mass (lbs): 86.8 lbs Muscle Mass (lbs): 106.4 lbs Total Body Water  (lbs): 72.6 lbs Visceral Fat Rating : 13   Other Clinical Data Fasting: No Labs: Yes Today's Visit #: 35 Starting Date: 11/13/19    Chief Complaint: OBESITY   History of Present Illness   The patient, with a history of obesity, vitamin B12 deficiency, prediabetes, and pancreatic cysts, presents for a follow-up visit. She reports a weight gain of two pounds over the past three months, despite attempts to increase physical activity through chair exercises and walk fitness three times per week. She has not been following a structured eating plan.  The patient has been experiencing blurred vision, which has improved since resuming metformin . She has also been taking iron supplements intermittently due to episodes of fatigue, which she attributes to potential anemia. She reports no changes in her pancreatic cysts, as confirmed by a recent MRI, and has been advised to follow up in two years unless symptoms arise.  The patient has been making dietary changes, including increased consumption of salmon, grilled chicken, broccoli, bananas, blueberries, and cottage cheese. She has been avoiding red meat, sweet tea, and eating out. She has also been consuming canned pineapple in moderation and has reduced her intake of yogurt and grapes. Despite these changes, she expresses frustration with her weight loss progress and struggles with portion control. She expresses a desire to lose at least five pounds and is open to trying a new meal plan.           PHYSICAL EXAM:  Blood pressure 99/62, pulse 78, temperature 98.5 F (36.9 C), height 5' 6 (1.676 m), weight 198 lb (89.8 kg), SpO2 97%. Body mass index is 31.96 kg/m.  DIAGNOSTIC DATA REVIEWED:  BMET    Component Value Date/Time   NA 139 08/19/2022 1412   NA 140 05/11/2022 1131   K 4.3 08/19/2022 1412   CL 102 08/19/2022 1412   CO2 29 08/19/2022 1412   GLUCOSE 95 08/19/2022 1412   BUN 13 08/19/2022 1412   BUN 14 05/11/2022 1131   CREATININE 0.77 08/19/2022 1412   CALCIUM 10.1 08/19/2022 1412   GFRNONAA >60 12/17/2020 1309   GFRAA 86 04/17/2020 1100   Lab Results  Component Value Date   HGBA1C 6.3 08/19/2022   HGBA1C 4.9 01/06/2007   Lab Results  Component Value Date   INSULIN  22.3 05/11/2022   INSULIN  30.1 (H) 11/13/2019   Lab Results  Component Value Date   TSH 0.84 08/19/2022   CBC    Component Value Date/Time   WBC 6.1 12/07/2022 1021   RBC 5.02 12/07/2022 1021   HGB 13.4 12/07/2022 1021   HGB 12.3 05/11/2022 1131   HCT 42.4 12/07/2022 1021   HCT 39.5 05/11/2022 1131   PLT 244.0 12/07/2022 1021   PLT 282 05/11/2022 1131   MCV 84.4 12/07/2022 1021   MCV 81 05/11/2022 1131   MCH 25.3 (L) 05/11/2022 1131   MCH 25.8 (L) 12/17/2020 1309   MCHC 31.6 12/07/2022 1021   RDW 18.1 (H) 12/07/2022 1021   RDW 13.9 05/11/2022  1131   Iron Studies    Component Value Date/Time   IRON 67 12/07/2022 1021   IRON 56 07/21/2021 1128   TIBC 501.2 (H) 12/07/2022 1021   TIBC 449 07/21/2021 1128   FERRITIN 12.0 12/07/2022 1021   FERRITIN 11 (L) 07/21/2021 1128   IRONPCTSAT 13.4 (L) 12/07/2022 1021   IRONPCTSAT 12 (L) 07/21/2021 1128   Lipid Panel     Component Value Date/Time   CHOL 160 08/19/2022 1412   CHOL 161 01/13/2022 1144   TRIG 92.0 08/19/2022 1412   HDL 59.40 08/19/2022 1412   HDL 53 01/13/2022 1144   CHOLHDL 3 08/19/2022 1412   VLDL 18.4 08/19/2022 1412   LDLCALC 82 08/19/2022 1412   LDLCALC 88 01/13/2022 1144   Hepatic Function Panel      Component Value Date/Time   PROT 7.9 08/19/2022 1412   PROT 7.4 05/11/2022 1131   ALBUMIN 4.5 08/19/2022 1412   ALBUMIN 4.7 05/11/2022 1131   AST 20 08/19/2022 1412   ALT 6 08/19/2022 1412   ALKPHOS 84 08/19/2022 1412   BILITOT 0.4 08/19/2022 1412   BILITOT 0.4 05/11/2022 1131   BILIDIR <0.1 12/17/2020 1409   IBILI NOT CALCULATED 12/17/2020 1409      Component Value Date/Time   TSH 0.84 08/19/2022 1412   Nutritional Lab Results  Component Value Date   VD25OH 57.07 08/19/2022   VD25OH 45.8 05/11/2022   VD25OH 55.1 01/13/2022     Assessment and Plan    Obesity Gained two pounds in the last three months. Engaging in chair exercises and walk fitness for 35 minutes, three times per week. Struggling with dietary habits and portion control, despite efforts to eat healthier foods. Reports feeling deprived when avoiding sweets and has difficulty consuming adequate protein. Expressed desire to lose at least five pounds and requested guidance on meal plans. - Provide category one meal plan - Encourage use of a food scale for portions - Advise on protein intake and suggest sources - Recommend sweeteners instead of sugar for sweet tea  Prediabetes Experiencing blurred vision, which improved after resuming metformin . Concerned about blood sugar levels and has been fasting for today's visit. - Continue metformin  BID - Check glucose levels in lab work  Pancreatic Cysts Well-managed pancreatic cysts confirmed by MRI with no growth. Follow-up interval extended to two years. Patient expressed concern but was reassured about stability. - Monitor for new symptoms and follow up sooner if issues arise - Check pancreatic function in lab work  Vitamin deficiencies due for labs -check vit D and B12 levels and follow  Follow-up - Schedule follow-up appointment in 4 to 6 weeks.        She was informed of the importance of frequent follow up visits to maximize her success with intensive  lifestyle modifications for her multiple health conditions.    Louann Penton, MD

## 2023-06-08 LAB — LIPID PANEL WITH LDL/HDL RATIO
Cholesterol, Total: 180 mg/dL (ref 100–199)
HDL: 63 mg/dL (ref >39)
LDL Chol Calc (NIH): 99 mg/dL (ref 0–99)
LDL/HDL Ratio: 1.6 {ratio} (ref 0.0–3.2)
Triglycerides: 103 mg/dL (ref 0–149)
VLDL Cholesterol Cal: 18 mg/dL (ref 5–40)

## 2023-06-08 LAB — CMP14+EGFR
ALT: 7 [IU]/L (ref 0–32)
AST: 20 [IU]/L (ref 0–40)
Albumin: 4.5 g/dL (ref 3.8–4.8)
Alkaline Phosphatase: 87 [IU]/L (ref 44–121)
BUN/Creatinine Ratio: 10 — ABNORMAL LOW (ref 12–28)
BUN: 8 mg/dL (ref 8–27)
Bilirubin Total: 0.3 mg/dL (ref 0.0–1.2)
CO2: 26 mmol/L (ref 20–29)
Calcium: 9.7 mg/dL (ref 8.7–10.3)
Chloride: 101 mmol/L (ref 96–106)
Creatinine, Ser: 0.79 mg/dL (ref 0.57–1.00)
Globulin, Total: 2.8 g/dL (ref 1.5–4.5)
Glucose: 91 mg/dL (ref 70–99)
Potassium: 4.2 mmol/L (ref 3.5–5.2)
Sodium: 141 mmol/L (ref 134–144)
Total Protein: 7.3 g/dL (ref 6.0–8.5)
eGFR: 77 mL/min/{1.73_m2} (ref >59)

## 2023-06-08 LAB — HEMOGLOBIN A1C
Est. average glucose Bld gHb Est-mCnc: 117 mg/dL
Hgb A1c MFr Bld: 5.7 % — ABNORMAL HIGH (ref 4.8–5.6)

## 2023-06-08 LAB — VITAMIN B12: Vitamin B-12: 548 pg/mL (ref 232–1245)

## 2023-06-08 LAB — AMYLASE: Amylase: 50 U/L (ref 31–110)

## 2023-06-08 LAB — INSULIN, RANDOM: INSULIN: 16 u[IU]/mL (ref 2.6–24.9)

## 2023-06-08 LAB — VITAMIN D 25 HYDROXY (VIT D DEFICIENCY, FRACTURES): Vit D, 25-Hydroxy: 73.2 ng/mL (ref 30.0–100.0)

## 2023-06-08 LAB — LIPASE: Lipase: 52 U/L (ref 14–85)

## 2023-06-26 ENCOUNTER — Other Ambulatory Visit: Payer: Self-pay | Admitting: Family Medicine

## 2023-07-01 ENCOUNTER — Other Ambulatory Visit (INDEPENDENT_AMBULATORY_CARE_PROVIDER_SITE_OTHER): Payer: Self-pay | Admitting: Family Medicine

## 2023-07-01 DIAGNOSIS — R7303 Prediabetes: Secondary | ICD-10-CM

## 2023-07-01 NOTE — Progress Notes (Signed)
 [name] [address] [place] [address] Phone: [phone] Subjective:   [name], am serving as a scribe for Dr. Arthea Claudene.  I'm seeing this patient by the request  of:  Theophilus Andrews, Tully GRADE, MD  CC: Bilateral knee pain left greater than right and right hip pain  YEP:Dlagzrupcz  05/03/2024 Stable at the moment we will hold off on any other injections.  Increase activity slowly.  Follow-up again in 6 to 8 weeks     Chronic problem with exacerbation again.  Will try the injection today to see if we could alleviate this.  More posteriorly than previous injection.  Discussed different things such as over-the-counter medications including ibuprofen 400 mg up to 3 times a day if needed.  Also discussed patient's gabapentin  at 300 mg at night.  Patient will increase activity as tolerated and continue to work on hip abductor strengthening.  Follow-up again in 6 to 8 weeks.     Updated 07/05/2023 Cassandra [name] is a 78 y.o. female coming in with complaint of L knee and R hip pain, do feel the right knee pain has been relatively normal.  Started having more right hip pain and concern for more of a lumbar radiculopathy.  Was to increase gabapentin  to 300 mg at night.  Patient states knee has been doing well, but has started to hurt again about a week ago. Hip is doing well, not hurting but weak.  Patient recently did see primary care within the last month and labs were fairly unremarkable including vitamin D  and B12 within normal range     Past Medical History:  Diagnosis Date   Anemia    Anxiety    Asthma    Back pain    BPPV (benign paroxysmal positional vertigo)    Chronic allergic conjunctivitis    Depression    Diverticulosis    Fibromyalgia    Gallbladder problem    Gallstones    GERD (gastroesophageal reflux disease)    Hiatal hernia    History of bladder stone    Iron (Fe) deficiency anemia    Joint pain    Lump in female  breast    Mild persistent asthma    followed by pcp--- dr jonelle. sharma ([place] allergy/asthma)   OA (osteoarthritis)    knees, hands   OAB (overactive bladder)    OSA (obstructive sleep apnea)    per pt has not used cpap several years   Pancreatic cyst    PONV (postoperative nausea and vomiting)    Pre-diabetes    RLS (restless legs syndrome)    Seasonal and perennial allergic rhinitis    SUI (stress urinary incontinence, female)    Swallowing difficulty    Vitamin B12 deficiency    Vitamin D  deficiency    Wears glasses    Past Surgical History:  Procedure Laterality Date   BLADDER SUSPENSION  1990's   sling   bladder tacking  2010   with mesh   BREAST LUMPECTOMY Left 1985   Benign cyst   BREAST REDUCTION SURGERY Bilateral 2003 approx.   CORNEA LESION EXCISION Right 2005  approx.   CYSTOSCOPY N/A 07/12/2016   Procedure: CYSTOSCOPY, VAGINOSCOPY, EXAM UNDER ANESTHESIA, STONE LITHOTRIPSY,;  Surgeon: Belvie LITTIE Clara, MD;  Location: [facility];  Service: Urology;  Laterality: N/A;   CYSTOSCOPY N/A 10/11/2016   Procedure: CYSTOSCOPY;  Surgeon: Clara Belvie LITTIE, MD;  Location: [facility];  Service:  Urology;  Laterality: N/A;   CYSTOSCOPY WITH LITHOLAPAXY N/A 10/11/2016   Procedure: CYSTOSCOPY WITH LITHOLAPAXY/ EXCISION OF MESH;  Surgeon: Sherrilee Belvie CROME, MD;  Location: [facility];  Service: Urology;  Laterality: N/A;   CYSTOSCOPY WITH LITHOLAPAXY N/A 01/22/2019   Procedure: CYSTOSCOPY WITH LITHOLAPAXY;  Surgeon: Sherrilee Belvie CROME, MD;  Location: [facility];  Service: Urology;  Laterality: N/A;  1 HR   CYSTOSCOPY WITH LITHOLAPAXY N/A 08/09/2022   Procedure: CYSTOLITHOLAPAXY;  Surgeon: Selma Donnice SAUNDERS, MD;  Location: [facility];  Service: Urology;  Laterality: N/A;  45 MINUTES NEEDED FOR CASE   HOLMIUM LASER APPLICATION  07/12/2016   Procedure: HOLMIUM LASER APPLICATION;  Surgeon: Belvie CROME Sherrilee, MD;   Location: [facility];  Service: Urology;;   HOLMIUM LASER APPLICATION N/A 01/22/2019   Procedure: HOLMIUM LASER APPLICATION;  Surgeon: Sherrilee Belvie CROME, MD;  Location: [facility];  Service: Urology;  Laterality: N/A;   RIGHT KNEE ARTHROSCOPY  2013   TOTAL KNEE ARTHROPLASTY Right 09/12/2012   Procedure: RIGHT TOTAL KNEE ARTHROPLASTY;  Surgeon: Dempsey LULLA Moan, MD;  Location: [facility];  Service: Orthopedics;  Laterality: Right;   VAGINAL HYSTERECTOMY  1984   Social History   Socioeconomic History   Marital status: Widowed    Spouse name: Not on file   Number of children: 2   Years of education: Not on file   Highest education level: Bachelor's degree (e.g., BA, AB, BS)  Occupational History   Occupation: engineer, technical sales Retired  Tobacco Use   Smoking status: Never   Smokeless tobacco: Never  Vaping Use   Vaping status: Never Used  Substance and Sexual Activity   Alcohol use: Yes    Comment: 1x monthly   Drug use: Never   Sexual activity: Not on file  Other Topics Concern   Not on file  Social History Narrative   Lives alone   R handed   Caffeine: 1 C a day   Social Drivers of Corporate Investment Banker Strain: Low Risk  (08/19/2022)   Overall Financial Resource Strain (CARDIA)    Difficulty of Paying Living Expenses: Not hard at all  Food Insecurity: No Food Insecurity (08/19/2022)   Hunger Vital Sign    Worried About Running Out of Food in the Last Year: Never true    Ran Out of Food in the Last Year: Never true  Transportation Needs: No Transportation Needs (08/19/2022)   PRAPARE - Administrator, Civil Service (Medical): No    Lack of Transportation (Non-Medical): No  Physical Activity: Sufficiently Active (08/19/2022)   Exercise Vital Sign    Days of Exercise per Week: 7 days    Minutes of Exercise per Session: 40 min  Stress: No Stress Concern Present (08/19/2022)   [employer] of Occupational Health -  Occupational Stress Questionnaire    Feeling of Stress : Not at all  Social Connections: Moderately Integrated (08/19/2022)   Social Connection and Isolation Panel [NHANES]    Frequency of Communication with Friends and Family: More than three times a week    Frequency of Social Gatherings with Friends and Family: Twice a week    Attends Religious Services: More than 4 times per year    Active Member of [employer] or Organizations: Yes    Attends Banker Meetings: More than 4 times per year    Marital Status: Widowed   Allergies  Allergen Reactions   Cephalosporins  Hives   Chlorhexidine  Hives   Codeine     Other reaction(s): Unknown   Hydrochlorothiazide Hives   Misc. Sulfonamide Containing Compounds Hives   Penicillins Hives   Sulfa Antibiotics Hives   Sulfacetamide Sodium     Other reaction(s): Unknown   Family History  Problem Relation Age of Onset   Kidney cancer Mother    Diabetes Mother    Obesity Mother    Dementia Mother        died age 60   Other Father        burn victum   Alcoholism Father    Colon cancer Neg Hx    Inflammatory bowel disease Neg Hx    Liver disease Neg Hx    Pancreatic cancer Neg Hx    Stomach cancer Neg Hx    Rectal cancer Neg Hx     Current Outpatient Medications (Endocrine & Metabolic):    metFORMIN  (GLUCOPHAGE ) 500 MG tablet, Take 1 tablet (500 mg total) by mouth 2 (two) times daily with a meal.  Current Outpatient Medications (Cardiovascular):    EPINEPHrine  0.3 mg/0.3 mL IJ SOAJ injection, Inject 0.3 mg into the muscle as needed for anaphylaxis.   spironolactone  (ALDACTONE ) 25 MG tablet, TAKE 1 TABLET (25 MG TOTAL) BY MOUTH DAILY.  Current Outpatient Medications (Respiratory):    fluticasone (FLONASE) 50 MCG/ACT nasal spray, Place into both nostrils.    Current Outpatient Medications (Other):    escitalopram  (LEXAPRO ) 10 MG tablet, TAKE 1 TABLET (10 MG TOTAL) BY MOUTH DAILY WITH BREAKFAST.   gabapentin  (NEURONTIN ) 300 MG  capsule, TAKE 1 CAPSULE BY MOUTH EVERYDAY AT BEDTIME   meclizine  (ANTIVERT ) 25 MG tablet, TAKE 1 TABLET BY MOUTH 3 TIMES DAILY AS NEEDED FOR DIZZINESS.   mirabegron ER (MYRBETRIQ) 25 MG TB24 tablet, Take 25 mg by mouth daily.   omeprazole  (PRILOSEC) 20 MG capsule, Take 20 mg by mouth 2 (two) times daily before a meal.   psyllium (REGULOID) 0.52 g capsule, Take 0.52 g by mouth daily.   traZODone  (DESYREL ) 50 MG tablet, TAKE 1 TABLET (50 MG TOTAL) BY MOUTH AT BEDTIME AS NEEDED. FOR SLEEP   Reviewed prior external information including notes and imaging from  primary care provider As well as notes that were available from care everywhere and other healthcare systems.  Past medical history, social, surgical and family history all reviewed in electronic medical record.  No pertanent information unless stated regarding to the chief complaint.   Review of Systems:  No headache, visual changes, nausea, vomiting, diarrhea, constipation, dizziness, abdominal pain, skin rash, fevers, chills, night sweats, weight loss, swollen lymph nodes, body aches, joint swelling, chest pain, shortness of breath, mood changes. POSITIVE muscle aches  Objective  Blood pressure 128/82, pulse 85, height 5' 6 (1.676 m), weight 202 lb (91.6 kg), SpO2 97%.   General: No apparent distress alert and oriented x3 mood and affect normal, dressed appropriately.  HEENT: Pupils equal, extraocular movements intact  Respiratory: Patient's speak in full sentences and does not appear short of breath  Cardiovascular: No lower extremity edema, non tender, no erythema  Bilateral knees do have some arthritic changes noted.  Some mild crepitus noted.  No significant increase in discomfort noted though today.    Impression and Recommendations:    The above documentation has been reviewed and is accurate and complete Tywana Robotham M Abijah Roussel, DO

## 2023-07-05 ENCOUNTER — Encounter: Payer: Self-pay | Admitting: Family Medicine

## 2023-07-05 ENCOUNTER — Ambulatory Visit: Payer: PPO | Admitting: Family Medicine

## 2023-07-05 VITALS — BP 128/82 | HR 85 | Ht 66.0 in | Wt 202.0 lb

## 2023-07-05 DIAGNOSIS — M1712 Unilateral primary osteoarthritis, left knee: Secondary | ICD-10-CM | POA: Diagnosis not present

## 2023-07-05 DIAGNOSIS — E663 Overweight: Secondary | ICD-10-CM

## 2023-07-05 DIAGNOSIS — M25551 Pain in right hip: Secondary | ICD-10-CM

## 2023-07-05 NOTE — Assessment & Plan Note (Signed)
 Underlying arthritis as well.  Discussed with patient that we need to consider the possibility of an intra-articular injection if this continues to give us  trouble.  Patient does not want to restart physical therapy at this moment but has done it in the past.  Discussed which activities to do and which ones to avoid.  Increase activity slowly.  Follow-up again in 6 to 8 weeks.

## 2023-07-05 NOTE — Patient Instructions (Signed)
Good to see you Cassandra [name]  Keep watching hip and knee Glad you are doing well overall See me again in 2-3 months

## 2023-07-05 NOTE — Assessment & Plan Note (Signed)
 Arthritic changes noted of the knees bilaterally.  Concern with that is giving her difficulty.  Patient wants to be doing more weight loss but finds it difficult to be more active.  Will refer patient to wellness center to see if they can help her with some of the weight loss as well.  Discussed with patient about other treatment options for the knee and patient wants to avoid any surgical intervention.  We discussed icing regimen, increase activity slowly.  Follow-up again with me in 6 to 8 weeks otherwise.

## 2023-07-08 DIAGNOSIS — N3281 Overactive bladder: Secondary | ICD-10-CM | POA: Diagnosis not present

## 2023-07-18 NOTE — Progress Notes (Unsigned)
[facility] [address] [place]. [place] [address] [phone]       OFFICE FOLLOW UP NOTE  Ms. Cassandra [name] Date of Birth:  06-09-1945 Medical Record Number:  [id number]   Primary neurologist: Dr. Vickey Huger Reason for visit: Insomnia, RLS    SUBJECTIVE:   CHIEF COMPLAINT:  Chief Complaint  Patient presents with   Follow-up    RM 3, alone. Here for insomnia concerns. No new concerns. Doing well.    Follow-up visit:  Prior visit: 01/10/2023  Brief HPI:   Cassandra [name] is a 78 y.o. female who was initially evaluated by Dr. Vickey Huger on 12/15/2021 for concern of insomnia.  Underwent sleep study 2009 by Dr. Craige Cotta with borderline sleep apnea and difficulty tolerating CPAP.  Repeated sleep study 12/30/2021 which did not show any significant sleep apnea but did show frequent periodic limb movements and insomnia with sleep initiation.  Neuropsychology evaluation 10/2022 without any progressive neurological disorder and felt memory complaints likely normal for age as well as underlying sleep disturbance and longstanding anxiety/depression contributing.  At prior visit, continued on trazodone for insomnia. Felt Mirapex made RLS symptoms worse therefore self discontinued, currently not overly bothersome so held off on further medication options.  Complained of persistent vertigo associated with hearing loss and tinnitus, referral placed to ENT.    Interval history:  Overall has been doing well since prior visit.  Continues on trazodone 50 mg nightly, feels this dosage is working well and tolerating well, she feels she falls asleep easier with trazodone but she still struggles with sleeping throughout the entire night. Can wake up after a couple hours of sleep and has difficulty falling back asleep, at times is able to sleep for 4-6 hours straight but not consistently.  She has been taking melatonin 5 mg nightly which has helped some.  Tries to ensure good sleep habits, she  has removed the TV from her bedroom, she will do bible study prior to going to bed, if unable to fall asleep after an hour, she will read more or get out of bed for a little while. She no longer feels she needs a nap during the day.  She also continues to struggle with nocturia, PCP started her on Myrbetriq, is also on a fluid pill which she has found helps with her vertigo. She occasionally has issues with RLS but not consistently. She feels her memory is overall stable, still struggles with some short term memory but denies any progression, continues to maintain ADLs and IADLs independently.      ROS:   14 system review of systems performed and negative with exception of those listed in HPI  PMH:  Past Medical History:  Diagnosis Date   Anemia    Anxiety    Asthma    Back pain    BPPV (benign paroxysmal positional vertigo)    Chronic allergic conjunctivitis    Depression    Diverticulosis    Fibromyalgia    Gallbladder problem    Gallstones    GERD (gastroesophageal reflux disease)    Hiatal hernia    History of bladder stone    Iron (Fe) deficiency anemia    Joint pain    Lump in female breast    Mild persistent asthma    followed by pcp--- dr r. sharma ([place] allergy/asthma)   OA (osteoarthritis)    knees, hands   OAB (overactive bladder)    OSA (obstructive sleep apnea)    per pt has  not used cpap several years   Pancreatic cyst    PONV (postoperative nausea and vomiting)    Pre-diabetes    RLS (restless legs syndrome)    Seasonal and perennial allergic rhinitis    SUI (stress urinary incontinence, female)    Swallowing difficulty    Vitamin B12 deficiency    Vitamin D deficiency    Wears glasses     PSH:  Past Surgical History:  Procedure Laterality Date   BLADDER SUSPENSION  1990's   sling   bladder tacking  2010   with mesh   BREAST LUMPECTOMY Left 1985   Benign cyst   BREAST REDUCTION SURGERY Bilateral 2003 approx.   CORNEA LESION EXCISION Right  2005  approx.   CYSTOSCOPY N/A 07/12/2016   Procedure: CYSTOSCOPY, VAGINOSCOPY, EXAM UNDER ANESTHESIA, STONE LITHOTRIPSY,;  Surgeon: Malen Gauze, MD;  Location[facility];  Service: Urology;  Laterality: N/A;   CYSTOSCOPY N/A 10/11/2016   Procedure: CYSTOSCOPY;  Surgeon: Malen Gauze, MD;  Location: [facility];  Service: Urology;  Laterality: N/A;   CYSTOSCOPY WITH LITHOLAPAXY N/A 10/11/2016   Procedure: CYSTOSCOPY WITH LITHOLAPAXY/ EXCISION OF MESH;  Surgeon: Malen Gauze, MD;  Location[facility];  Service: Urology;  Laterality: N/A;   CYSTOSCOPY WITH LITHOLAPAXY N/A 01/22/2019   Procedure: CYSTOSCOPY WITH LITHOLAPAXY;  Surgeon: Malen Gauze, MD;  Location: [facility];  Service: Urology;  Laterality: N/A;  1 HR   CYSTOSCOPY WITH LITHOLAPAXY N/A 08/09/2022   Procedure: CYSTOLITHOLAPAXY;  Surgeon: Jannifer Hick, MD;  Location: [facility];  Service: Urology;  Laterality: N/A;  45 MINUTES NEEDED FOR CASE   HOLMIUM LASER APPLICATION  07/12/2016   Procedure: HOLMIUM LASER APPLICATION;  Surgeon: Malen Gauze, MD;  Location: [facility];  Service: Urology;;   HOLMIUM LASER APPLICATION N/A 01/22/2019   Procedure: HOLMIUM LASER APPLICATION;  Surgeon: Malen Gauze, MD;  Location: [facility];  Service: Urology;  Laterality: N/A;   RIGHT KNEE ARTHROSCOPY  2013   TOTAL KNEE ARTHROPLASTY Right 09/12/2012   Procedure: RIGHT TOTAL KNEE ARTHROPLASTY;  Surgeon: Loanne Drilling, MD;  Location: [facility];  Service: Orthopedics;  Laterality: Right;   VAGINAL HYSTERECTOMY  1984    Social History:  Social History   Socioeconomic History   Marital status: Widowed    Spouse name: Not on file   Number of children: 2   Years of education: Not on file   Highest education level: Bachelor's degree (e.g., BA, AB, BS)  Occupational History   Occupation: Designer, multimedia Retired  Tobacco Use   Smoking status: Never   Smokeless tobacco: Never  Vaping Use   Vaping status: Never Used  Substance and Sexual Activity   Alcohol use: Yes    Comment: 1x monthly   Drug use: Never   Sexual activity: Not on file  Other Topics Concern   Not on file  Social History Narrative   Lives alone   R handed   Caffeine: 1 C a day   Social Drivers of Corporate investment banker Strain: Low Risk  (08/19/2022)   Overall Financial Resource Strain (CARDIA)    Difficulty of Paying Living Expenses: Not hard at all  Food Insecurity: No Food Insecurity (08/19/2022)   Hunger Vital Sign    Worried About Running Out of Food in the Last Year: Never true    Ran Out of Food in the Last  Year: Never true  Transportation Needs: No Transportation Needs (08/19/2022)   PRAPARE - Administrator, Civil Service (Medical): No    Lack of Transportation (Non-Medical): No  Physical Activity: Sufficiently Active (08/19/2022)   Exercise Vital Sign    Days of Exercise per Week: 7 days    Minutes of Exercise per Session: 40 min  Stress: No Stress Concern Present (08/19/2022)   [employer] of Occupational Health - Occupational Stress Questionnaire    Feeling of Stress : Not at all  Social Connections: Moderately Integrated (08/19/2022)   Social Connection and Isolation Panel [NHANES]    Frequency of Communication with Friends and Family: More than three times a week    Frequency of Social Gatherings with Friends and Family: Twice a week    Attends Religious Services: More than 4 times per year    Active Member of [employer] or Organizations: Yes    Attends Banker Meetings: More than 4 times per year    Marital Status: Widowed  Intimate Partner Violence: Not At Risk (08/19/2022)   Humiliation, Afraid, Rape, and Kick questionnaire    Fear of Current or Ex-Partner: No    Emotionally Abused: No    Physically Abused: No    Sexually Abused: No    Family  History:  Family History  Problem Relation Age of Onset   Kidney cancer Mother    Diabetes Mother    Obesity Mother    Dementia Mother        died age 45   Other Father        burn victum   Alcoholism Father    Colon cancer Neg Hx    Inflammatory bowel disease Neg Hx    Liver disease Neg Hx    Pancreatic cancer Neg Hx    Stomach cancer Neg Hx    Rectal cancer Neg Hx     Medications:   Current Outpatient Medications on File Prior to Visit  Medication Sig Dispense Refill   EPINEPHrine 0.3 mg/0.3 mL IJ SOAJ injection Inject 0.3 mg into the muscle as needed for anaphylaxis.     escitalopram (LEXAPRO) 10 MG tablet TAKE 1 TABLET (10 MG TOTAL) BY MOUTH DAILY WITH BREAKFAST. 90 tablet 1   fluticasone (FLONASE) 50 MCG/ACT nasal spray Place into both nostrils.     gabapentin (NEURONTIN) 300 MG capsule TAKE 1 CAPSULE BY MOUTH EVERYDAY AT BEDTIME 90 capsule 0   meclizine (ANTIVERT) 25 MG tablet TAKE 1 TABLET BY MOUTH 3 TIMES DAILY AS NEEDED FOR DIZZINESS. 30 tablet 0   metFORMIN (GLUCOPHAGE) 500 MG tablet Take 1 tablet (500 mg total) by mouth 2 (two) times daily with a meal. 60 tablet 0   mirabegron ER (MYRBETRIQ) 25 MG TB24 tablet Take 25 mg by mouth daily.     omeprazole (PRILOSEC) 20 MG capsule Take 20 mg by mouth 2 (two) times daily before a meal.     psyllium (REGULOID) 0.52 g capsule Take 0.52 g by mouth daily.     spironolactone (ALDACTONE) 25 MG tablet TAKE 1 TABLET (25 MG TOTAL) BY MOUTH DAILY. 90 tablet 1   traZODone (DESYREL) 50 MG tablet TAKE 1 TABLET (50 MG TOTAL) BY MOUTH AT BEDTIME AS NEEDED. FOR SLEEP 90 tablet 1   No current facility-administered medications on file prior to visit.    Allergies:   Allergies  Allergen Reactions   Cephalosporins Hives   Chlorhexidine Hives   Codeine     Other reaction(s): Unknown  Hydrochlorothiazide Hives   Misc. Sulfonamide Containing Compounds Hives   Penicillins Hives   Sulfa Antibiotics Hives   Sulfacetamide Sodium     Other  reaction(s): Unknown      OBJECTIVE:  Physical Exam  Vitals:   07/19/23 1323  BP: 127/66  Pulse: 69  Weight: 204 lb (92.5 kg)  Height: 5\' 6"  (1.676 m)   Body mass index is 32.93 kg/m. No results found.  General: well developed, well nourished, very pleasant elderly Caucasian female, seated, in no evident distress Head: head normocephalic and atraumatic.   Neck: supple with no carotid or supraclavicular bruits Cardiovascular: regular rate and rhythm, no murmurs Musculoskeletal: no deformity Skin:  no rash/petichiae Vascular:  Normal pulses all extremities   Neurologic Exam Mental Status: Awake and fully alert. Oriented to place and time. Recent memory mildly impaired and remote memory intact. Attention span, concentration and fund of knowledge appropriate. Mood and affect appropriate.  Cranial Nerves: Pupils equal, briskly reactive to light. Extraocular movements full without nystagmus. Visual fields full to confrontation. Hearing intact. Facial sensation intact. Face, tongue, palate moves normally and symmetrically.  Motor: Normal bulk and tone. Normal strength in all tested extremity muscles Sensory.: intact to touch , pinprick , position and vibratory sensation.  Coordination: Rapid alternating movements normal in all extremities. Finger-to-nose and heel-to-shin performed accurately bilaterally. Gait and Station: Arises from chair without difficulty. Stance is normal. Gait demonstrates normal stride length and balance without use of AD.  Reflexes: 1+ and symmetric. Toes downgoing.       ASSESSMENT: Cassandra [name] is a 78 y.o. year old female with longstanding history of insomnia.  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 C/o memory loss without significant findings on neurocognitive testing in 10/2022, felt to be more age-related as well as  underlying insomnia, anxiety and depression.     PLAN:  Insomnia: Continue trazodone 50mg  nightly, unable to tolerate higher dosage. Refill provided.  Continue melatonin, discussed gradually increasing dosage if needed but would not recommend more than 12mg  per night. still has difficulty sleeping throughout the entire night.   RLS/PLMD: question if RLS/PLMD is contributing to nighttime awakenings. She is agreeable to start low-dose Requip 0.5 mg nightly to see if this further helps.  Discussed potential side effects and further information provided in AVS.  She is also currently on gabapentin 300 mg nightly for nerve pain prescribed by Dr. Katrinka Blazing, could consider increasing this dose if unable to tolerate Requip.  Previously tried Mirapex but felt this made symptoms worse. Does have hx of iron deficiency, advised to continue to follow with PCP for ongoing monitoring.  Subjective memory complaints: overall stable. Will continue to try to treat insomnia as noted above. Continue to follow with PCP for depression/anxiety management.     Follow up in 6 months or call earlier if needed   CC:  PCP: Philip Aspen, Limmie Patricia, MD    I spent 30 minutes of face-to-face and non-face-to-face time with patient.  This included previsit chart review, lab review, study review, order entry, electronic health record documentation, patient education and discussion regarding above diagnoses and treatment plan and answered all other questions to patient's satisfaction  Ihor Austin, [facility]  [facility] [address] [place], [place] [address]  Phone [phone] Fax [phone] Note: This document was prepared with digital dictation and possible smart phrase technology. Any  transcriptional errors that result from this process are unintentional.

## 2023-07-19 ENCOUNTER — Ambulatory Visit: Payer: PPO | Admitting: Adult Health

## 2023-07-19 ENCOUNTER — Encounter: Payer: Self-pay | Admitting: Adult Health

## 2023-07-19 VITALS — BP 127/66 | HR 69 | Ht 66.0 in | Wt 204.0 lb

## 2023-07-19 DIAGNOSIS — F5104 Psychophysiologic insomnia: Secondary | ICD-10-CM

## 2023-07-19 DIAGNOSIS — G2581 Restless legs syndrome: Secondary | ICD-10-CM | POA: Diagnosis not present

## 2023-07-19 DIAGNOSIS — R4189 Other symptoms and signs involving cognitive functions and awareness: Secondary | ICD-10-CM

## 2023-07-19 MED ORDER — ROPINIROLE HCL 0.25 MG PO TABS: 0.2500 mg | ORAL_TABLET | Freq: Every day | ORAL | 5 refills | Status: DC

## 2023-07-19 MED ORDER — TRAZODONE HCL 50 MG PO TABS: 50.0000 mg | ORAL_TABLET | Freq: Every evening | ORAL | 3 refills | Status: DC | PRN

## 2023-07-19 NOTE — Patient Instructions (Addendum)
Your Plan:  Start Requip 0.25mg  1-3 hours before bedtime to help with RLS, if symptoms persist after 1-2 weeks, please let me know  Continue trazodone nightly for insomnia  Continue melatonin - you can try to increase by 2mg  as needed until it works well for you; would not recommend increasing past 12mg      Follow up in 6 months or call earlier if needed     Thank you for coming to see [place] at [facility]. I hope we have been able to provide you high quality care today.  You may receive a patient satisfaction survey over the next few weeks. We would appreciate your feedback and comments so that we may continue to improve ourselves and the health of our patients.    Ropinirole Tablets What is this medication? ROPINIROLE (roe PIN i role) treats the symptoms of Parkinson disease. It works by acting like dopamine, a substance in your body that helps manage movements and coordination. This reduces the symptoms of Parkinson, such as body stiffness and tremors. It may also be used to treat restless legs syndrome (RLS). This medicine may be used for other purposes; ask your health care provider or pharmacist if you have questions. COMMON BRAND NAME(S): Requip What should I tell my care team before I take this medication? They need to know if you have any of these conditions: Heart disease High blood pressure Kidney disease Liver disease Low blood pressure Narcolepsy Sleep apnea Tobacco use An unusual or allergic reaction to ropinirole, other medications, foods, dyes, or preservatives Pregnant or trying to get pregnant Breast-feeding How should I use this medication? Take this medication by mouth with water. Take it as directed on the prescription label. You can take it with or without food. If it upsets your stomach, take it with food. If it upsets your stomach, take it with food. Keep taking this medication unless your care team tells you to stop. Stopping it too  quickly can cause serious side effects. It can also make your condition worse. Talk to your care team about the use of this medication in children. Special care may be needed. Overdosage: If you think you have taken too much of this medicine contact a poison control center or emergency room at once. NOTE: This medicine is only for you. Do not share this medicine with others. What if I miss a dose? If you miss a dose, take it as soon as you can. If it is almost time for your next dose, take only that dose. Do not take double or extra doses. What may interact with this medication? Alcohol Antihistamines for allergy, cough and cold Certain medications for depression, anxiety, or mental health conditions Certain medications for seizures, such as phenobarbital, primidone Certain medications for sleep Ciprofloxacin Estrogen or progestin hormones Fluvoxamine General anesthetics, such as halothane, isoflurane, methoxyflurane, propofol Medications for blood pressure Medications that relax muscles for surgery Metoclopramide Opioid medications for pain Rifampin Tobacco This list may not describe all possible interactions. Give your health care provider a list of all the medicines, herbs, non-prescription drugs, or dietary supplements you use. Also tell them if you smoke, drink alcohol, or use illegal drugs. Some items may interact with your medicine. What should I watch for while using this medication? Visit your care team for regular checks on your progress. Tell your care team if your symptoms do not start to get better or if they get worse. Do not suddenly stop taking this medication. You may develop  a severe reaction. Your care team will tell you how much medication to take. If your care team wants you to stop the medication, the dose may be slowly lowered over time to avoid any side effects. This medication may affect your coordination, reaction time, or judgement. Do not drive or operate  machinery until you know how this medication affects you. Sit up or stand slowly to reduce the risk of dizzy or fainting spells. Drinking alcohol with this medication can increase the risk of these side effects. When taking this medication, you may fall asleep without notice. You may be doing activities, such as driving a car, talking, or eating. You may not feel drowsy before it happens. Contact your care team right away if this happens to you. There have been reports of increased sexual urges or other strong urges, such as gambling while taking this medication. If you experience any of these while taking this medication, you should report this to your care team as soon as possible. Your mouth may get dry. Chewing sugarless gum or sucking hard candy and drinking plenty of water may help. Contact your care team if the problem does not go away or is severe. What side effects may I notice from receiving this medication? Side effects that you should report to your care team as soon as possible: Allergic reactions--skin rash, itching, hives, swelling of the face, lips, tongue, or throat Falling asleep during daily activities Low blood pressure--dizziness, feeling faint or lightheaded, blurry vision Mood and behavior changes--anxiety, nervousness, irritability and restlessness, confusion, hallucinations, feeling distrust or suspicion of others New or worsening uncontrolled and repetitive movements of the face, mouth, or upper body Slow heartbeat--dizziness, feeling faint or lightheaded, confusion, trouble breathing, unusual weakness or fatigue Urges to engage in impulsive behaviors such as gambling, binge eating, sexual activity, or shopping in ways that are unusual for you Side effects that usually do not require medical attention (report to your care team if they continue or are bothersome): Dizziness Drowsiness Nausea Swelling of the ankles, hands, or feet Unusual weakness or fatigue Upset  stomach Vomiting This list may not describe all possible side effects. Call your doctor for medical advice about side effects. You may report side effects to FDA at 1-800-FDA-1088. Where should I keep my medication? Keep out of the reach of children and pets. Store at room temperature between 20 and 25 degrees C (68 and 77 degrees F). Protect from light and moisture. Keep the container tightly closed. Get rid of any unused medication after the expiration date. To get rid of medications that are no longer needed or have expired: Take the medication to a take-back program. Check with your pharmacy or law enforcement to find a location. If you cannot return the medication, check the label or package insert to see if the medication should be thrown out in the garbage or flushed down the toilet. If you are not sure, ask your care team. If it is safe to put it in the trash, empty the medication out of the container. Mix the medication with cat litter, dirt, coffee grounds, or other unwanted substance. Seal the mixture in a bag or container. Put it in the trash. NOTE: This sheet is a summary. It may not cover all possible information. If you have questions about this medicine, talk to your doctor, pharmacist, or health care provider.  2024 Elsevier/Gold Standard (2021-08-26 00:00:00)

## 2023-07-27 ENCOUNTER — Encounter (INDEPENDENT_AMBULATORY_CARE_PROVIDER_SITE_OTHER): Payer: Self-pay | Admitting: Family Medicine

## 2023-07-27 ENCOUNTER — Ambulatory Visit (INDEPENDENT_AMBULATORY_CARE_PROVIDER_SITE_OTHER): Payer: PPO | Admitting: Family Medicine

## 2023-07-27 ENCOUNTER — Encounter: Payer: Self-pay | Admitting: Family Medicine

## 2023-07-27 VITALS — BP 115/56 | HR 81 | Temp 98.6°F | Ht 65.0 in | Wt 202.0 lb

## 2023-07-27 DIAGNOSIS — E669 Obesity, unspecified: Secondary | ICD-10-CM

## 2023-07-27 DIAGNOSIS — Z6833 Body mass index (BMI) 33.0-33.9, adult: Secondary | ICD-10-CM | POA: Diagnosis not present

## 2023-07-27 DIAGNOSIS — K2100 Gastro-esophageal reflux disease with esophagitis, without bleeding: Secondary | ICD-10-CM | POA: Diagnosis not present

## 2023-07-27 DIAGNOSIS — R7303 Prediabetes: Secondary | ICD-10-CM | POA: Diagnosis not present

## 2023-07-27 DIAGNOSIS — R1310 Dysphagia, unspecified: Secondary | ICD-10-CM

## 2023-07-27 DIAGNOSIS — M6284 Sarcopenia: Secondary | ICD-10-CM | POA: Diagnosis not present

## 2023-07-27 MED ORDER — METFORMIN HCL 500 MG PO TABS: 500.0000 mg | ORAL_TABLET | Freq: Two times a day (BID) | ORAL | 0 refills | Status: AC

## 2023-07-27 NOTE — Progress Notes (Signed)
 .smr  Office: [phone]  /  Fax: [phone]  WEIGHT SUMMARY AND BIOMETRICS  Anthropometric Measurements Height: 5\' 5"  (1.651 m) Weight: 202 lb (91.6 kg) BMI (Calculated): 33.61 Weight at Last Visit: 198 lb Weight Lost Since Last Visit: 0 Weight Gained Since Last Visit: 4 lb Starting Weight: 193 lb Total Weight Loss (lbs): 0 lb (0 kg)   Body Composition  Body Fat %: 46 % Fat Mass (lbs): 93 lbs Muscle Mass (lbs): 103.6 lbs Total Body Water (lbs): 75.4 lbs Visceral Fat Rating : 14   Other Clinical Data Fasting: No Labs: No Today's Visit #: 47 Starting Date: 11/13/19    Chief Complaint: OBESITY   Discussed the use of AI scribe software for clinical note transcription with the patient, who gave verbal consent to proceed.  History of Present Illness   The patient, with obesity and prediabetes, presents with difficulty adhering to an eating plan and weight gain.  She has struggled to adhere to her eating plan, following it only about 50% of the time, resulting in a four-pound weight gain over the past month. Despite this, she has been engaging in physical activity, including chair exercises and walking for 40 minutes five times a week. She is concerned about her weight gain and the impact on her weight loss efforts.  She has a history of prediabetes and is currently on metformin. Her most recent A1c is 5.7, which has improved from 6.3 last year. She has been working on diet and exercise to manage her condition. She experiences difficulty swallowing, particularly with red meat and chicken, which affects her ability to eat these foods. She alternates between cottage cheese and yogurt for breakfast and has been eating more salmon, avoiding red meat due to swallowing difficulties. She also struggles with finding suitable side dishes for salmon.  She experiences heartburn and takes heartburn medication twice daily, in the morning and at night. If she skips a dose, she  experiences significant discomfort, including waking up at night with acid reflux.  She has a history of restless legs syndrome and has been experiencing sleep disturbances. She has been prescribed Requip for restless legs, which she has not yet started. She also takes gabapentin for neuropathy, which she takes at night. She reports that her sleep has improved, but she still wakes up frequently at night.  She has been trying to maintain a healthy lifestyle, including walking and using a treadmill during inclement weather. She has been tracking her steps and has been active in her home. She has also been working on her diet, avoiding sweets and unhealthy foods, and has been eating more vegetables and lean proteins. She feels she is not benefitting much from our program as she is unable to follow our plan at least 90% of the time right now for a variety of reasons.          PHYSICAL EXAM:  Blood pressure (!) 115/56, pulse 81, temperature 98.6 F (37 C), height 5\' 5"  (1.651 m), weight 202 lb (91.6 kg), SpO2 97%. Body mass index is 33.61 kg/m.  DIAGNOSTIC DATA REVIEWED:  BMET    Component Value Date/Time   NA 141 06/07/2023 1138   K 4.2 06/07/2023 1138   CL 101 06/07/2023 1138   CO2 26 06/07/2023 1138   GLUCOSE 91 06/07/2023 1138   GLUCOSE 95 08/19/2022 1412   BUN 8 06/07/2023 1138   CREATININE 0.79 06/07/2023 1138   CALCIUM 9.7 06/07/2023 1138   GFRNONAA >60 12/17/2020 1309  GFRAA 86 04/17/2020 1100   Lab Results  Component Value Date   HGBA1C 5.7 (H) 06/07/2023   HGBA1C 4.9 01/06/2007   Lab Results  Component Value Date   INSULIN 16.0 06/07/2023   INSULIN 30.1 (H) 11/13/2019   Lab Results  Component Value Date   TSH 0.84 08/19/2022   CBC    Component Value Date/Time   WBC 6.1 12/07/2022 1021   RBC 5.02 12/07/2022 1021   HGB 13.4 12/07/2022 1021   HGB 12.3 05/11/2022 1131   HCT 42.4 12/07/2022 1021   HCT 39.5 05/11/2022 1131   PLT 244.0 12/07/2022 1021   PLT 282  05/11/2022 1131   MCV 84.4 12/07/2022 1021   MCV 81 05/11/2022 1131   MCH 25.3 (L) 05/11/2022 1131   MCH 25.8 (L) 12/17/2020 1309   MCHC 31.6 12/07/2022 1021   RDW 18.1 (H) 12/07/2022 1021   RDW 13.9 05/11/2022 1131   Iron Studies    Component Value Date/Time   IRON 67 12/07/2022 1021   IRON 56 07/21/2021 1128   TIBC 501.2 (H) 12/07/2022 1021   TIBC 449 07/21/2021 1128   FERRITIN 12.0 12/07/2022 1021   FERRITIN 11 (L) 07/21/2021 1128   IRONPCTSAT 13.4 (L) 12/07/2022 1021   IRONPCTSAT 12 (L) 07/21/2021 1128   Lipid Panel     Component Value Date/Time   CHOL 180 06/07/2023 1138   TRIG 103 06/07/2023 1138   HDL 63 06/07/2023 1138   CHOLHDL 3 08/19/2022 1412   VLDL 18.4 08/19/2022 1412   LDLCALC 99 06/07/2023 1138   Hepatic Function Panel     Component Value Date/Time   PROT 7.3 06/07/2023 1138   ALBUMIN 4.5 06/07/2023 1138   AST 20 06/07/2023 1138   ALT 7 06/07/2023 1138   ALKPHOS 87 06/07/2023 1138   BILITOT 0.3 06/07/2023 1138   BILIDIR <0.1 12/17/2020 1409   IBILI NOT CALCULATED 12/17/2020 1409      Component Value Date/Time   TSH 0.84 08/19/2022 1412   Nutritional Lab Results  Component Value Date   VD25OH 73.2 06/07/2023   VD25OH 57.07 08/19/2022   VD25OH 45.8 05/11/2022     Assessment and Plan    Obesity with early Sarcopenia Struggles with weight loss despite 50% adherence to a category one eating plan. Gained four pounds in the last month, likely due to inadequate nutrition affecting resting metabolic rate. Engages in exercise, including chair exercises and walking, but requires more strengthening exercises. Not in the action stage of change regarding weight loss. Discussed the importance of consistent healthy eating and strengthening exercises for weight loss. Encouraged to continue making healthier choices and increase strengthening exercises. - Continue PC/[place] and increase strengthening exercises - Return to clinic in six months if needed without  being considered a new patient  Prediabetes Managed with metformin. Recent labs show an A1c of 5.7, improved from 6.3 last year. Concerned about weight gain affecting blood sugar control. Discussed the importance of continuing metformin and maintaining dietary and exercise habits to keep A1c under control. Encouraged to follow up with PCP for additional refills of metformin. - Refill metformin 500 mg bid for 90 days - Follow up with PCP for additional refills  Gastroesophageal Reflux Disease (GERD) Experiences heartburn and takes medication twice daily. Reports heartburn at night disrupting sleep, requiring sitting up and belching to relieve symptoms. Discussed the need to contact GI doctor for further evaluation, possibly including an upper and lower endoscopy to assess swallowing difficulties and GERD symptoms. - Contact  GI doctor for further evaluation and possible endoscopy  Dysphagia Reports difficulty swallowing, particularly with red meat and tough foods, affecting ability to eat comfortably and maintain weight. Discussed the need to contact GI doctor for further evaluation, possibly including an upper and lower endoscopy to assess swallowing difficulties. - Contact GI doctor for further evaluation, possibly including an upper and lower endoscopy -maybe after this is resolved when will be able to follow a structured eating plan closely enough to lose weight. She may follow up with [place] as needed.          She was informed of the importance of frequent follow up visits to maximize her success with intensive lifestyle modifications for her multiple health conditions.    Quillian Quince, MD

## 2023-08-23 ENCOUNTER — Ambulatory Visit (INDEPENDENT_AMBULATORY_CARE_PROVIDER_SITE_OTHER): Payer: PPO | Admitting: Family Medicine

## 2023-08-23 VITALS — Wt 198.0 lb

## 2023-08-23 DIAGNOSIS — Z0000 Encounter for general adult medical examination without abnormal findings: Secondary | ICD-10-CM | POA: Diagnosis not present

## 2023-08-23 NOTE — Patient Instructions (Signed)
 I really enjoyed getting to talk with you today! I am available on Tuesdays and Thursdays for virtual visits if you have any questions or concerns, or if I can be of any further assistance.   CHECKLIST FROM ANNUAL WELLNESS VISIT:  -Follow up (please call to schedule if not scheduled after visit):   -yearly for annual wellness visit with primary care office  Here is a list of your preventive care/health maintenance measures and the plan for each if any are due:  PLAN For any measures below that may be due:   Health Maintenance  Topic Date Due   COVID-19 Vaccine (7 - 2024-25 season) 08/23/2023   Medicare Annual Wellness (AWV)  08/22/2024   DTaP/Tdap/Td (3 - Td or Tdap) 10/28/2028   Pneumonia Vaccine 50+ Years old  Completed   INFLUENZA VACCINE  Completed   DEXA SCAN  Completed   Hepatitis C Screening  Completed   Zoster Vaccines- Shingrix  Completed   HPV VACCINES  Aged Out   Colonoscopy  Discontinued    -See a dentist at least yearly  -Get your eyes checked and then per your eye specialist's recommendations  -Other issues addressed today:   -I have included below further information regarding a healthy whole foods based diet, physical activity guidelines for adults, stress management and opportunities for social connections. I hope you find this information useful.   -----------------------------------------------------------------------------------------------------------------------------------------------------------------------------------------------------------------------------------------------------------    NUTRITION: -eat real food: lots of colorful vegetables (half the plate) and fruits -5-7 servings of vegetables and fruits per day (fresh or steamed is best), exp. 2 servings of vegetables with lunch and dinner and 2 servings of fruit per day. Berries and greens such as kale and collards are great choices.  -consume on a regular basis:  fresh fruits, fresh  veggies, fish, nuts, seeds, healthy oils (such as olive oil, avocado oil), whole grains (make sure for bread/pasta/crackers/etc., that the first ingredient on label contains the word "whole"), legumes. -can eat small amounts of dairy and lean meat (no larger than the palm of your hand), but avoid processed meats such as ham, bacon, lunch meat, etc. -drink water -try to avoid fast food and pre-packaged foods, processed meat, ultra processed foods/beverages (donuts, candy, etc.) -most experts advise limiting sodium to < 2300mg  per day, should limit further is any chronic conditions such as high blood pressure, heart disease, diabetes, etc. The American Heart Association advised that < 1500mg  is is ideal -try to avoid foods/beverages that contain any ingredients with names you do not recognize  -try to avoid foods/beverages  with added sugar or sweeteners/sweets  -try to avoid sweet drinks (including diet drinks): soda, juice, Gatorade, sweet tea, power drinks, diet drinks -try to avoid white rice, white bread, pasta (unless whole grain)  EXERCISE GUIDELINES FOR ADULTS: -if you wish to increase your physical activity, do so gradually and with the approval of your doctor -STOP and seek medical care immediately if you have any chest pain, chest discomfort or trouble breathing when starting or increasing exercise  -move and stretch your body, legs, feet and arms when sitting for long periods -Physical activity guidelines for optimal health in adults: -get at least 150 minutes per week of moderate exercise (can talk, but not sing); this is about 20-30 minutes of sustained activity 5-7 days per week or two 10-15 minute episodes of sustained activity 5-7 days per week -do some muscle building/resistance training/strength training at least 2 days per week  -balance exercises 3+ days per week:  Stand somewhere where you have something sturdy to hold onto if you lose balance    1) lift up on toes, then back  down, start with 5x per day and work up to 20x   2) stand and lift one leg straight out to the side so that foot is a few inches of the floor, start with 5x each side and work up to 20x each side   3) stand on one foot, start with 5 seconds each side and work up to 20 seconds on each side  If you need ideas or help with getting more active:  -Silver sneakers https://tools.silversneakers.com  -Walk with a Doc: http://www.duncan-williams.com/  -try to include resistance (weight lifting/strength building) and balance exercises twice per week: or the following link for ideas: http://castillo-powell.com/  BuyDucts.dk  STRESS MANAGEMENT: -can try meditating, or just sitting quietly with deep breathing while intentionally relaxing all parts of your body for 5 minutes daily -if you need further help with stress, anxiety or depression please follow up with your primary doctor or contact the wonderful folks at [facility]: [phone]  SOCIAL CONNECTIONS: -options in [place] if you wish to engage in more social and exercise related activities:  -Silver sneakers https://tools.silversneakers.com  -Walk with a Doc: http://www.duncan-williams.com/  -Check out the [facility] Active Adults 50+ section on the [place] [employer] (hiking clubs, book clubs, cards and games, chess, exercise classes, aquatic classes and much more) - see the website for details: https://www.[place].gov/departments/parks-recreation/active-adults50  -YouTube has lots of exercise videos for different ages and abilities as well  -[facility] (a variety of indoor and outdoor inperson activities for adults). [phone]. [address].  -Virtual Online Classes (a variety of topics): see seniorplanet.org or call ([phone])[phone]  -consider volunteering at a school, hospice center, church, senior  center or elsewhere

## 2023-08-23 NOTE — Progress Notes (Signed)
 PATIENT CHECK-IN and HEALTH RISK ASSESSMENT QUESTIONNAIRE:  -completed by phone/video for upcoming Medicare Preventive Visit  Pre-Visit Check-in: 1)Vitals (height, wt, BP, etc) - record in vitals section for visit on day of visit Request home vitals (wt, BP, etc.) and enter into vitals, THEN update Vital Signs SmartPhrase below at the top of the HPI. See below.  2)Review and Update Medications, Allergies PMH, Surgeries, Social history in Epic 3)Hospitalizations in the last year with date/reason? n 4)Review and Update Care Team (patient's specialists) in Epic 5) Complete PHQ9 in Epic  6) Complete Fall Screening in Epic 7)Review all Health Maintenance Due and order under PCP if not done.  Medicare Wellness Patient Questionnaire:  Answer theses question about your habits: How often do you have a drink containing alcohol?none How many drinks containing alcohol do you have on a typical day when you are drinking?n/a How often do you have six or more drinks on one occasion?never Have you ever smoked?n  How many packs a day do/did you smoke? n Do you use smokeless tobacco?n Do you use an illicit drugs?n On average, how many days per week do you engage in moderate to strenuous exercise (like a brisk walk)?7 On average, how many minutes do you engage in exercise at this level?40 Typical diet: bagel in the morning, coffee, eggs or cottage cheese or yogurt in the morning, salad with protein, chicken, salmon, limits red meat  Beverages:  coffee in the morning, water Social connections: friends and family more than 3 times per week, religious services  Answer theses question about your everyday activities: Can you perform most household chores?y Are you deaf or have significant trouble hearing? Uses hearing aids and they are doing ok Do you feel that you have a problem with memory?n, had neuropsych testing and all was good Do you feel safe at home? y Last dentist visit? Goes on regular basis,  just went recently 8. Do you have any difficulty performing your everyday activities?n Are you having any difficulty walking, taking medications on your own, and or difficulty managing daily home needs?n Do you have difficulty walking or climbing stairs?n Do you have difficulty dressing or bathing?n Do you have difficulty doing errands alone such as visiting a doctor's office or shopping?n Do you currently have any difficulty preparing food and eating?n Do you currently have any difficulty using the toilet?n Do you have any difficulty managing your finances?n Do you have any difficulties with housekeeping of managing your housekeeping?n   Do you have Advanced Directives in place (Living Will, Healthcare Power or Attorney)? y   Last eye Exam and location? Went recently, was told vision is good, Dr. Hyacinth Meeker   Do you currently use prescribed or non-prescribed narcotic or opioid pain medications?n  Do you have a history or close family history of breast, ovarian, tubal or peritoneal cancer or a family member with BRCA (breast cancer susceptibility 1 and 2) gene mutations? N, but reports one family member had renal cancer and that her doctors monitor her kidneys.     ----------------------------------------------------------------------------------------------------------------------------------------------------------------------------------------------------------------------  Because this visit was a virtual/telehealth visit, some criteria may be missing or patient reported. Any vitals not documented were not able to be obtained and vitals that have been documented are patient reported.    MEDICARE ANNUAL PREVENTIVE VISIT WITH PROVIDER: (Welcome to Medicare, initial annual wellness or annual wellness exam)  Virtual Visit via Video Note  I connected with Cassandra [name] on 08/23/23 by a video enabled telemedicine application and verified that  I am speaking with the correct person using  two identifiers.  Location patient: home Location provider:work or home office Persons participating in the virtual visit: patient, provider  Concerns and/or follow up today: doing well, no new concerns.   Cassandra [name]  has a past medical history of Anemia, Anxiety, Asthma, Back pain, BPPV (benign paroxysmal positional vertigo), Chronic allergic conjunctivitis, Depression, Diverticulosis, Fibromyalgia, Gallbladder problem, Gallstones, GERD (gastroesophageal reflux disease), Hiatal hernia, History of bladder stone, Iron (Fe) deficiency anemia, Joint pain, Lump in female breast, Mild persistent asthma, OA (osteoarthritis), OAB (overactive bladder), OSA (obstructive sleep apnea), Pancreatic cyst, PONV (postoperative nausea and vomiting), Pre-diabetes, RLS (restless legs syndrome), Seasonal and perennial allergic rhinitis, SUI (stress urinary incontinence, female), Swallowing difficulty, Vitamin B12 deficiency, Vitamin D deficiency, and Wears glasses.   See HM section in Epic for other details of completed HM.    ROS: negative for report of fevers, unintentional weight loss, vision changes, vision loss, hearing loss or change, chest pain, sob, hemoptysis, melena, hematochezia, hematuria, falls, bleeding or bruising, thoughts of suicide or self harm, memory loss  Patient-completed extensive health risk assessment - reviewed and discussed with the patient: See Health Risk Assessment completed with patient prior to the visit either above or in recent phone note. This was reviewed in detailed with the patient today and appropriate recommendations, orders and referrals were placed as needed per Summary below and patient instructions.   Review of Medical History: -PMH, PSH, Family History and current specialty and care providers reviewed and updated and listed below   Patient Care Team: Philip Aspen, Limmie Patricia, MD as PCP - General (Internal Medicine)   Past Medical History:  Diagnosis Date   Anemia     Anxiety    Asthma    Back pain    BPPV (benign paroxysmal positional vertigo)    Chronic allergic conjunctivitis    Depression    Diverticulosis    Fibromyalgia    Gallbladder problem    Gallstones    GERD (gastroesophageal reflux disease)    Hiatal hernia    History of bladder stone    Iron (Fe) deficiency anemia    Joint pain    Lump in female breast    Mild persistent asthma    followed by pcp--- dr Elvera Lennox. [place] [facility] ([place] allergy/asthma)   OA (osteoarthritis)    knees, hands   OAB (overactive bladder)    OSA (obstructive sleep apnea)    per pt has not used cpap several years   Pancreatic cyst    PONV (postoperative nausea and vomiting)    Pre-diabetes    RLS (restless legs syndrome)    Seasonal and perennial allergic rhinitis    SUI (stress urinary incontinence, female)    Swallowing difficulty    Vitamin B12 deficiency    Vitamin D deficiency    Wears glasses     Past Surgical History:  Procedure Laterality Date   BLADDER SUSPENSION  1990's   sling   bladder tacking  2010   with mesh   BREAST LUMPECTOMY Left 1985   Benign cyst   BREAST REDUCTION SURGERY Bilateral 2003 approx.   CORNEA LESION EXCISION Right 2005  approx.   CYSTOSCOPY N/A 07/12/2016   Procedure: CYSTOSCOPY, VAGINOSCOPY, EXAM UNDER ANESTHESIA, STONE LITHOTRIPSY,;  Surgeon: Malen Gauze, MD;  Location: [facility];  Service: Urology;  Laterality: N/A;   CYSTOSCOPY N/A 10/11/2016   Procedure: CYSTOSCOPY;  Surgeon: Malen Gauze, MD;  Location: [facility];  Service:  Urology;  Laterality: N/A;   CYSTOSCOPY WITH LITHOLAPAXY N/A 10/11/2016   Procedure: CYSTOSCOPY WITH LITHOLAPAXY/ EXCISION OF MESH;  Surgeon: Malen Gauze, MD;  Location: [facility];  Service: Urology;  Laterality: N/A;   CYSTOSCOPY WITH LITHOLAPAXY N/A 01/22/2019   Procedure: CYSTOSCOPY WITH LITHOLAPAXY;  Surgeon: Malen Gauze, MD;  Location: [facility];   Service: Urology;  Laterality: N/A;  1 HR   CYSTOSCOPY WITH LITHOLAPAXY N/A 08/09/2022   Procedure: CYSTOLITHOLAPAXY;  Surgeon: Jannifer Hick, MD;  Location: [facility];  Service: Urology;  Laterality: N/A;  45 MINUTES NEEDED FOR CASE   HOLMIUM LASER APPLICATION  07/12/2016   Procedure: HOLMIUM LASER APPLICATION;  Surgeon: Malen Gauze, MD;  Location: [facility];  Service: Urology;;   HOLMIUM LASER APPLICATION N/A 01/22/2019   Procedure: HOLMIUM LASER APPLICATION;  Surgeon: Malen Gauze, MD;  Location: [facility];  Service: Urology;  Laterality: N/A;   RIGHT KNEE ARTHROSCOPY  2013   TOTAL KNEE ARTHROPLASTY Right 09/12/2012   Procedure: RIGHT TOTAL KNEE ARTHROPLASTY;  Surgeon: Loanne Drilling, MD;  Location: [facility];  Service: Orthopedics;  Laterality: Right;   VAGINAL HYSTERECTOMY  1984    Social History   Socioeconomic History   Marital status: Widowed    Spouse name: Not on file   Number of children: 2   Years of education: Not on file   Highest education level: Bachelor's degree (e.g., BA, AB, BS)  Occupational History   Occupation: Engineer, technical sales Retired  Tobacco Use   Smoking status: Never   Smokeless tobacco: Never  Vaping Use   Vaping status: Never Used  Substance and Sexual Activity   Alcohol use: Yes    Comment: 1x monthly   Drug use: Never   Sexual activity: Not on file  Other Topics Concern   Not on file  Social History Narrative   Lives alone   R handed   Caffeine: 1 C a day   Social Drivers of Corporate investment banker Strain: Low Risk  (08/22/2023)   Overall Financial Resource Strain (CARDIA)    Difficulty of Paying Living Expenses: Not hard at all  Food Insecurity: No Food Insecurity (08/22/2023)   Hunger Vital Sign    Worried About Running Out of Food in the Last Year: Never true    Ran Out of Food in the Last Year: Never true  Transportation Needs: No Transportation Needs (08/22/2023)    PRAPARE - Administrator, Civil Service (Medical): No    Lack of Transportation (Non-Medical): No  Physical Activity: Sufficiently Active (08/22/2023)   Exercise Vital Sign    Days of Exercise per Week: 7 days    Minutes of Exercise per Session: 40 min  Stress: No Stress Concern Present (08/22/2023)   [employer] of Occupational Health - Occupational Stress Questionnaire    Feeling of Stress : Only a little  Social Connections: Moderately Isolated (08/22/2023)   Social Connection and Isolation Panel [NHANES]    Frequency of Communication with Friends and Family: More than three times a week    Frequency of Social Gatherings with Friends and Family: Three times a week    Attends Religious Services: More than 4 times per year    Active Member of Clubs or Organizations: No    Attends Banker Meetings: Not on file    Marital Status: Widowed  Intimate Partner Violence: Not At Risk (08/19/2022)  Humiliation, Afraid, Rape, and Kick questionnaire    Fear of Current or Ex-Partner: No    Emotionally Abused: No    Physically Abused: No    Sexually Abused: No    Family History  Problem Relation Age of Onset   Kidney cancer Mother    Diabetes Mother    Obesity Mother    Dementia Mother        died age 85   Other Father        burn victum   Alcoholism Father    Colon cancer Neg Hx    Inflammatory bowel disease Neg Hx    Liver disease Neg Hx    Pancreatic cancer Neg Hx    Stomach cancer Neg Hx    Rectal cancer Neg Hx     Current Outpatient Medications on File Prior to Visit  Medication Sig Dispense Refill   EPINEPHrine 0.3 mg/0.3 mL IJ SOAJ injection Inject 0.3 mg into the muscle as needed for anaphylaxis.     escitalopram (LEXAPRO) 10 MG tablet TAKE 1 TABLET (10 MG TOTAL) BY MOUTH DAILY WITH BREAKFAST. 90 tablet 1   fluticasone (FLONASE) 50 MCG/ACT nasal spray Place into both nostrils.     gabapentin (NEURONTIN) 300 MG capsule TAKE 1 CAPSULE BY MOUTH  EVERYDAY AT BEDTIME 90 capsule 0   meclizine (ANTIVERT) 25 MG tablet TAKE 1 TABLET BY MOUTH 3 TIMES DAILY AS NEEDED FOR DIZZINESS. 30 tablet 0   metFORMIN (GLUCOPHAGE) 500 MG tablet Take 1 tablet (500 mg total) by mouth 2 (two) times daily with a meal. 180 tablet 0   mirabegron ER (MYRBETRIQ) 25 MG TB24 tablet Take 25 mg by mouth daily.     omeprazole (PRILOSEC) 20 MG capsule Take 20 mg by mouth 2 (two) times daily before a meal.     psyllium (REGULOID) 0.52 g capsule Take 0.52 g by mouth daily.     rOPINIRole (REQUIP) 0.25 MG tablet Take 1 tablet (0.25 mg total) by mouth at bedtime. 30 tablet 5   spironolactone (ALDACTONE) 25 MG tablet TAKE 1 TABLET (25 MG TOTAL) BY MOUTH DAILY. 90 tablet 1   traZODone (DESYREL) 50 MG tablet Take 1 tablet (50 mg total) by mouth at bedtime as needed. for sleep 90 tablet 3   No current facility-administered medications on file prior to visit.    Allergies  Allergen Reactions   Cephalosporins Hives   Chlorhexidine Hives   Codeine     Other reaction(s): Unknown   Hydrochlorothiazide Hives   Misc. Sulfonamide Containing Compounds Hives   Penicillins Hives   Sulfa Antibiotics Hives   Sulfacetamide Sodium     Other reaction(s): Unknown       Physical Exam Vitals requested from patient and listed below if patient had equipment and was able to obtain at home for this virtual visit: There were no vitals filed for this visit. Estimated body mass index is 32.95 kg/m as calculated from the following:   Height as of 07/27/23: 5\' 5"  (1.651 m).   Weight as of this encounter: 198 lb (89.8 kg).  EKG (optional): deferred due to virtual visit  GENERAL: alert, oriented, no acute distress detected, full vision exam deferred due to pandemic and/or virtual encounter  HEENT: atraumatic, conjunttiva clear, no obvious abnormalities on inspection of external nose and ears  NECK: normal movements of the head and neck  LUNGS: on inspection no signs of respiratory  distress, breathing rate appears normal, no obvious gross SOB, gasping or wheezing  CV:  no obvious cyanosis  MS: moves all visible extremities without noticeable abnormality  PSYCH/NEURO: pleasant and cooperative, no obvious depression or anxiety, speech and thought processing grossly intact, Cognitive function grossly intact  Flowsheet Row Office Visit from 08/19/2022 in [facility] [place]  PHQ-9 Total Score 0           08/23/2023   10:44 AM 08/19/2022    1:45 PM 06/16/2021    2:17 PM 11/13/2019   10:39 AM 01/17/2019   10:55 AM  Depression screen PHQ 2/9  Decreased Interest 0 0 0 1 0  Down, Depressed, Hopeless 0 0 0 1 0  PHQ - 2 Score 0 0 0 2 0  Altered sleeping  0  1 0  Tired, decreased energy  0  2 0  Change in appetite  0  2 0  Feeling bad or failure about yourself   0  0 0  Trouble concentrating  0  1 0  Moving slowly or fidgety/restless  0  0 0  Suicidal thoughts  0  0 0  PHQ-9 Score  0  8 0  Difficult doing work/chores  Not difficult at all  Somewhat difficult Not difficult at all       12/17/2020   12:29 PM 06/16/2021    2:17 PM 11/22/2021    2:27 PM 08/19/2022    1:36 PM 08/23/2023   10:42 AM  Fall Risk  Falls in the past year?  0  1 1  Was there an injury with Fall?  0  0 0  Fall Risk Category Calculator  0  1 1  Fall Risk Category (Retired)  Low     (RETIRED) Patient Fall Risk Level Low fall risk Low fall risk Low fall risk    Patient at Risk for Falls Due to  No Fall Risks   History of fall(s)  Fall risk Follow up    Falls evaluation completed Falls evaluation completed;Education provided   She is doing balance exercises a few days per week. Feels balance is ok. [place] in the middle of the night.   SUMMARY AND PLAN:  Encounter for Medicare annual wellness exam   Discussed applicable health maintenance/preventive health measures and advised and referred or ordered per  patient preferences: -she has her mammogram this week -discussed covid vaccine recs/ risks Health Maintenance  Topic Date Due   COVID-19 Vaccine (7 - 2024-25 season) 08/23/2023   Medicare Annual Wellness (AWV)  08/22/2024   DTaP/Tdap/Td (3 - Td or Tdap) 10/28/2028   Pneumonia Vaccine 75+ Years old  Completed   INFLUENZA VACCINE  Completed   DEXA SCAN  Completed   Hepatitis C Screening  Completed   Zoster Vaccines- Shingrix  Completed   HPV VACCINES  Aged Out   Colonoscopy  Discontinued    Education and counseling on the following was provided based on the above review of health and a plan/checklist for the patient, along with additional information discussed, was provided for the patient in the patient instructions :   -Provided counseling and plan for increased risk of falling if applicable per above screening. She is doing balance exercises.  -Advised and counseled on a healthy lifestyle - including the importance of a healthy diet, regular physical activity, social connections  -Reviewed patient's current diet. Advised and counseled on a whole foods based healthy diet. A summary of a healthy diet was provided  in the Patient Instructions. Advised to reduce ultra processed grains and processed foods beverages and sweets and increase veggies.  -reviewed patient's current physical activity level and discussed exercise guidelines for adults. Discussed community resources and ideas for safe exercise at home to assist in meeting exercise guideline recommendations in a safe and healthy way.  -Advise yearly dental visits at minimum and regular eye exams   Follow up: see patient instructions     Patient Instructions  I really enjoyed getting to talk with you today! I am available on Tuesdays and Thursdays for virtual visits if you have any questions or concerns, or if I can be of any further assistance.   CHECKLIST FROM ANNUAL WELLNESS VISIT:  -Follow up (please call to schedule if not  scheduled after visit):   -yearly for annual wellness visit with primary care office  Here is a list of your preventive care/health maintenance measures and the plan for each if any are due:  PLAN For any measures below that may be due:   Health Maintenance  Topic Date Due   COVID-19 Vaccine (7 - 2024-25 season) 08/23/2023   Medicare Annual Wellness (AWV)  08/22/2024   DTaP/Tdap/Td (3 - Td or Tdap) 10/28/2028   Pneumonia Vaccine 66+ Years old  Completed   INFLUENZA VACCINE  Completed   DEXA SCAN  Completed   Hepatitis C Screening  Completed   Zoster Vaccines- Shingrix  Completed   HPV VACCINES  Aged Out   Colonoscopy  Discontinued    -See a dentist at least yearly  -Get your eyes checked and then per your eye specialist's recommendations  -Other issues addressed today:   -I have included below further information regarding a healthy whole foods based diet, physical activity guidelines for adults, stress management and opportunities for social connections. I hope you find this information useful.   -----------------------------------------------------------------------------------------------------------------------------------------------------------------------------------------------------------------------------------------------------------    NUTRITION: -eat real food: lots of colorful vegetables (half the plate) and fruits -5-7 servings of vegetables and fruits per day (fresh or steamed is best), exp. 2 servings of vegetables with lunch and dinner and 2 servings of fruit per day. Berries and greens such as kale and collards are great choices.  -consume on a regular basis:  fresh fruits, fresh veggies, fish, nuts, seeds, healthy oils (such as olive oil, avocado oil), whole grains (make sure for bread/pasta/crackers/etc., that the first ingredient on label contains the word "whole"), legumes. -can eat small amounts of dairy and lean meat (no larger than the palm of your  hand), but avoid processed meats such as ham, bacon, lunch meat, etc. -drink water -try to avoid fast food and pre-packaged foods, processed meat, ultra processed foods/beverages (donuts, candy, etc.) -most experts advise limiting sodium to < 2300mg  per day, should limit further is any chronic conditions such as high blood pressure, heart disease, diabetes, etc. The American Heart Association advised that < 1500mg  is is ideal -try to avoid foods/beverages that contain any ingredients with names you do not recognize  -try to avoid foods/beverages  with added sugar or sweeteners/sweets  -try to avoid sweet drinks (including diet drinks): soda, juice, Gatorade, sweet tea, power drinks, diet drinks -try to avoid white rice, white bread, pasta (unless whole grain)  EXERCISE GUIDELINES FOR ADULTS: -if you wish to increase your physical activity, do so gradually and with the approval of your doctor -STOP and seek medical care immediately if you have any chest pain, chest discomfort or trouble breathing when starting or increasing exercise  -move  and stretch your body, legs, feet and arms when sitting for long periods -Physical activity guidelines for optimal health in adults: -get at least 150 minutes per week of moderate exercise (can talk, but not sing); this is about 20-30 minutes of sustained activity 5-7 days per week or two 10-15 minute episodes of sustained activity 5-7 days per week -do some muscle building/resistance training/strength training at least 2 days per week  -balance exercises 3+ days per week:   Stand somewhere where you have something sturdy to hold onto if you lose balance    1) lift up on toes, then back down, start with 5x per day and work up to 20x   2) stand and lift one leg straight out to the side so that foot is a few inches of the floor, start with 5x each side and work up to 20x each side   3) stand on one foot, start with 5 seconds each side and work up to 20 seconds on  each side  If you need ideas or help with getting more active:  -Silver sneakers https://tools.silversneakers.com  -Walk with a Doc: http://www.duncan-williams.com/  -try to include resistance (weight lifting/strength building) and balance exercises twice per week: or the following link for ideas: http://castillo-powell.com/  BuyDucts.dk  STRESS MANAGEMENT: -can try meditating, or just sitting quietly with deep breathing while intentionally relaxing all parts of your body for 5 minutes daily -if you need further help with stress, anxiety or depression please follow up with your primary doctor or contact the wonderful folks at [facility]: [phone]  SOCIAL CONNECTIONS: -options in [place] if you wish to engage in more social and exercise related activities:  -Silver sneakers https://tools.silversneakers.com  -Walk with a Doc: http://www.duncan-williams.com/  -Check out the [facility] Active Adults 50+ section on the [place] of [employer] (hiking clubs, book clubs, cards and games, chess, exercise classes, aquatic classes and much more) - see the website for details: https://www.[place].gov/departments/parks-recreation/active-adults50  -YouTube has lots of exercise videos for different ages and abilities as well  -[facility] (a variety of indoor and outdoor inperson activities for adults). [phone]. [address]  -Virtual Online Classes (a variety of topics): see seniorplanet.org or call [phone]  -consider volunteering at a school, hospice center, church, senior center or elsewhere            Terressa Koyanagi, DO

## 2023-08-26 DIAGNOSIS — Z1231 Encounter for screening mammogram for malignant neoplasm of breast: Secondary | ICD-10-CM | POA: Diagnosis not present

## 2023-08-26 LAB — HM MAMMOGRAPHY

## 2023-08-29 ENCOUNTER — Encounter: Payer: Self-pay | Admitting: Internal Medicine

## 2023-09-06 NOTE — Progress Notes (Unsigned)
 [name] [address] [place] [address] Phone: [phone] Subjective:   [name], am serving as a scribe for Dr. Antoine Primas.  I'm seeing this patient by the request  of:  Philip Aspen, Limmie Patricia, MD  CC: Right hip and left knee pain follow-up  [id number]  07/05/2023 Underlying arthritis as well.  Discussed with patient that we need to consider the possibility of an intra-articular injection if this continues to give [place] trouble.  Patient does not want to restart physical therapy at this moment but has done it in the past.  Discussed which activities to do and which ones to avoid.  Increase activity slowly.  Follow-up again in 6 to 8 weeks.     Arthritic changes noted of the knees bilaterally.  Concern with that is giving her difficulty.  Patient wants to be doing more weight loss but finds it difficult to be more active.  Will refer patient to wellness center to see if they can help her with some of the weight loss as well.  Discussed with patient about other treatment options for the knee and patient wants to avoid any surgical intervention.  We discussed icing regimen, increase activity slowly.  Follow-up again with me in 6 to 8 weeks otherwise.      Update 09/07/2023 Cassandra [name] is a 78 y.o. female coming in with complaint of R shoulder, R hip and L knee pain. Patient states that she has numbness and pain in the R arm that radiates down into her thumb.   Antalgic gait when she is walking and notes weakness intermittently.        Past Medical History:  Diagnosis Date   Anemia    Anxiety    Asthma    Back pain    BPPV (benign paroxysmal positional vertigo)    Chronic allergic conjunctivitis    Depression    Diverticulosis    Fibromyalgia    Gallbladder problem    Gallstones    GERD (gastroesophageal reflux disease)    Hiatal hernia    History of bladder stone    Iron (Fe) deficiency anemia    Joint pain    Lump  in female breast    Mild persistent asthma    followed by pcp--- dr Elvera Lennox. sharma ([place] allergy/asthma)   OA (osteoarthritis)    knees, hands   OAB (overactive bladder)    OSA (obstructive sleep apnea)    per pt has not used cpap several years   Pancreatic cyst    PONV (postoperative nausea and vomiting)    Pre-diabetes    RLS (restless legs syndrome)    Seasonal and perennial allergic rhinitis    SUI (stress urinary incontinence, female)    Swallowing difficulty    Vitamin B12 deficiency    Vitamin D deficiency    Wears glasses    Past Surgical History:  Procedure Laterality Date   BLADDER SUSPENSION  1990's   sling   bladder tacking  2010   with mesh   BREAST LUMPECTOMY Left 1985   Benign cyst   BREAST REDUCTION SURGERY Bilateral 2003 approx.   CORNEA LESION EXCISION Right 2005  approx.   CYSTOSCOPY N/A 07/12/2016   Procedure: CYSTOSCOPY, VAGINOSCOPY, EXAM UNDER ANESTHESIA, STONE LITHOTRIPSY,;  Surgeon: Malen Gauze, MD;  Location: [facility];  Service: Urology;  Laterality: N/A;   CYSTOSCOPY N/A 10/11/2016   Procedure: CYSTOSCOPY;  Surgeon: Malen Gauze, MD;  Location:  [facility];  Service: Urology;  Laterality: N/A;   CYSTOSCOPY WITH LITHOLAPAXY N/A 10/11/2016   Procedure: CYSTOSCOPY WITH LITHOLAPAXY/ EXCISION OF MESH;  Surgeon: Malen Gauze, MD;  Location: [facility];  Service: Urology;  Laterality: N/A;   CYSTOSCOPY WITH LITHOLAPAXY N/A 01/22/2019   Procedure: CYSTOSCOPY WITH LITHOLAPAXY;  Surgeon: Malen Gauze, MD;  Location: [facility];  Service: Urology;  Laterality: N/A;  1 HR   CYSTOSCOPY WITH LITHOLAPAXY N/A 08/09/2022   Procedure: CYSTOLITHOLAPAXY;  Surgeon: Jannifer Hick, MD;  Location: [facility];  Service: Urology;  Laterality: N/A;  45 MINUTES NEEDED FOR CASE   HOLMIUM LASER APPLICATION  07/12/2016   Procedure: HOLMIUM LASER APPLICATION;  Surgeon: Malen Gauze, MD;  Location: [facility];  Service: Urology;;   HOLMIUM LASER APPLICATION N/A 01/22/2019   Procedure: HOLMIUM LASER APPLICATION;  Surgeon: Malen Gauze, MD;  Location: [facility];  Service: Urology;  Laterality: N/A;   RIGHT KNEE ARTHROSCOPY  2013   TOTAL KNEE ARTHROPLASTY Right 09/12/2012   Procedure: RIGHT TOTAL KNEE ARTHROPLASTY;  Surgeon: Loanne Drilling, MD;  Location: [facility];  Service: Orthopedics;  Laterality: Right;   VAGINAL HYSTERECTOMY  1984   Social History   Socioeconomic History   Marital status: Widowed    Spouse name: Not on file   Number of children: 2   Years of education: Not on file   Highest education level: Bachelor's degree (e.g., BA, AB, BS)  Occupational History   Occupation: Engineer, technical sales Retired  Tobacco Use   Smoking status: Never   Smokeless tobacco: Never  Vaping Use   Vaping status: Never Used  Substance and Sexual Activity   Alcohol use: Yes    Comment: 1x monthly   Drug use: Never   Sexual activity: Not on file  Other Topics Concern   Not on file  Social History Narrative   Lives alone   R handed   Caffeine: 1 C a day   Social Drivers of Corporate investment banker Strain: Low Risk  (08/22/2023)   Overall Financial Resource Strain (CARDIA)    Difficulty of Paying Living Expenses: Not hard at all  Food Insecurity: No Food Insecurity (08/22/2023)   Hunger Vital Sign    Worried About Running Out of Food in the Last Year: Never true    Ran Out of Food in the Last Year: Never true  Transportation Needs: No Transportation Needs (08/22/2023)   PRAPARE - Administrator, Civil Service (Medical): No    Lack of Transportation (Non-Medical): No  Physical Activity: Sufficiently Active (08/22/2023)   Exercise Vital Sign    Days of Exercise per Week: 7 days    Minutes of Exercise per Session: 40 min  Stress: No Stress Concern Present (08/22/2023)   [employer] of  Occupational Health - Occupational Stress Questionnaire    Feeling of Stress : Only a little  Social Connections: Moderately Isolated (08/22/2023)   Social Connection and Isolation Panel [NHANES]    Frequency of Communication with Friends and Family: More than three times a week    Frequency of Social Gatherings with Friends and Family: Three times a week    Attends Religious Services: More than 4 times per year    Active Member of Clubs or Organizations: No    Attends Banker Meetings: Not on file    Marital Status: Widowed   Allergies  Allergen  Reactions   Cephalosporins Hives   Chlorhexidine Hives   Codeine     Other reaction(s): Unknown   Hydrochlorothiazide Hives   Misc. Sulfonamide Containing Compounds Hives   Penicillins Hives   Sulfa Antibiotics Hives   Sulfacetamide Sodium     Other reaction(s): Unknown   Family History  Problem Relation Age of Onset   Kidney cancer Mother    Diabetes Mother    Obesity Mother    Dementia Mother        died age 14   Other Father        burn victum   Alcoholism Father    Colon cancer Neg Hx    Inflammatory bowel disease Neg Hx    Liver disease Neg Hx    Pancreatic cancer Neg Hx    Stomach cancer Neg Hx    Rectal cancer Neg Hx     Current Outpatient Medications (Endocrine & Metabolic):    metFORMIN (GLUCOPHAGE) 500 MG tablet, Take 1 tablet (500 mg total) by mouth 2 (two) times daily with a meal.  Current Outpatient Medications (Cardiovascular):    EPINEPHrine 0.3 mg/0.3 mL IJ SOAJ injection, Inject 0.3 mg into the muscle as needed for anaphylaxis.   spironolactone (ALDACTONE) 25 MG tablet, TAKE 1 TABLET (25 MG TOTAL) BY MOUTH DAILY.  Current Outpatient Medications (Respiratory):    fluticasone (FLONASE) 50 MCG/ACT nasal spray, Place into both nostrils.    Current Outpatient Medications (Other):    escitalopram (LEXAPRO) 10 MG tablet, TAKE 1 TABLET (10 MG TOTAL) BY MOUTH DAILY WITH BREAKFAST.   gabapentin  (NEURONTIN) 300 MG capsule, TAKE 1 CAPSULE BY MOUTH EVERYDAY AT BEDTIME   meclizine (ANTIVERT) 25 MG tablet, TAKE 1 TABLET BY MOUTH 3 TIMES DAILY AS NEEDED FOR DIZZINESS.   mirabegron ER (MYRBETRIQ) 25 MG TB24 tablet, Take 25 mg by mouth daily.   omeprazole (PRILOSEC) 20 MG capsule, Take 20 mg by mouth 2 (two) times daily before a meal.   psyllium (REGULOID) 0.52 g capsule, Take 0.52 g by mouth daily.   rOPINIRole (REQUIP) 0.25 MG tablet, Take 1 tablet (0.25 mg total) by mouth at bedtime.   traZODone (DESYREL) 50 MG tablet, Take 1 tablet (50 mg total) by mouth at bedtime as needed. for sleep   Reviewed prior external information including notes and imaging from  primary care provider As well as notes that were available from care everywhere and other healthcare systems.  Past medical history, social, surgical and family history all reviewed in electronic medical record.  No pertanent information unless stated regarding to the chief complaint.   Review of Systems:  No headache, visual changes, nausea, vomiting, diarrhea, constipation, dizziness, abdominal pain, skin rash, fevers, chills, night sweats, weight loss, swollen lymph nodes, body aches, joint swelling, chest pain, shortness of breath, mood changes. POSITIVE muscle aches  Objective  There were no vitals taken for this visit.   General: No apparent distress alert and oriented x3 mood and affect normal, dressed appropriately.  HEENT: Pupils equal, extraocular movements intact  Respiratory: Patient's speak in full sentences and does not appear short of breath  Cardiovascular: No lower extremity edema, non tender, no erythema  Hip exam shows mild tenderness over the greater trochanteric area.  Knee exam shows severe crepitus noted of the left knee.  Instability with valgus and varus force.  Effusion noted of the patellofemoral joint  Neck exam does have loss lordosis.  Positive Spurling's noted.  Weakness noted in the C6  distribution.  Weakness  of the rotator cuff noted noted as well which does not correspond with the same nerve intervention.  Positive impingement of the shoulder is noted.  Severe pain with internal rotation.  After informed written and verbal consent, patient was seated on exam table. Left knee was prepped with alcohol swab and utilizing anterolateral approach, patient's left knee space was injected with 4:1  marcaine 0.5%: Kenalog 40mg /dL. Patient tolerated the procedure well without immediate complications.    Impression and Recommendations:    The above documentation has been reviewed and is accurate and complete Judi Saa, DO

## 2023-09-07 ENCOUNTER — Ambulatory Visit: Payer: PPO | Admitting: Family Medicine

## 2023-09-07 ENCOUNTER — Encounter: Payer: Self-pay | Admitting: Family Medicine

## 2023-09-07 VITALS — BP 108/64 | HR 68 | Ht 65.0 in | Wt 201.0 lb

## 2023-09-07 DIAGNOSIS — M25511 Pain in right shoulder: Secondary | ICD-10-CM

## 2023-09-07 DIAGNOSIS — M542 Cervicalgia: Secondary | ICD-10-CM

## 2023-09-07 DIAGNOSIS — M5010 Cervical disc disorder with radiculopathy, unspecified cervical region: Secondary | ICD-10-CM

## 2023-09-07 DIAGNOSIS — M1712 Unilateral primary osteoarthritis, left knee: Secondary | ICD-10-CM

## 2023-09-07 NOTE — Assessment & Plan Note (Signed)
 Repeat injection given today, tolerated the procedure well, has been over a year since we have done an injection.  Hopeful that this will make significant improvement and can repeat if necessary.  Patient is in agreement with the plan.  Follow-up again in 3 months has a trip in [place].

## 2023-09-07 NOTE — Patient Instructions (Addendum)
 MRI of shoulder and neck at [facility]. Injections today. Return in 3 months

## 2023-09-07 NOTE — Assessment & Plan Note (Signed)
 Patient is having some cervical radiculopathy in the C6 and C8 distribution.  With some weakness noted with grip strength noted.  MRI ordered today.  Having weakness also noted of the shoulder.  Has had severe AC arthritis previously diagnosed but not responding to the conservative therapy.  Do feel that a MRI is necessary also of the shoulder to see how much that is potentially playing a role.  Depending on findings we will discuss if any surgical intervention is necessary, patient would be a candidate for an epidural injection if needed.  Follow-up with me again in 6-8

## 2023-09-10 ENCOUNTER — Ambulatory Visit

## 2023-09-10 DIAGNOSIS — M542 Cervicalgia: Secondary | ICD-10-CM

## 2023-09-10 DIAGNOSIS — M7581 Other shoulder lesions, right shoulder: Secondary | ICD-10-CM | POA: Diagnosis not present

## 2023-09-10 DIAGNOSIS — M25511 Pain in right shoulder: Secondary | ICD-10-CM | POA: Diagnosis not present

## 2023-09-10 DIAGNOSIS — M19011 Primary osteoarthritis, right shoulder: Secondary | ICD-10-CM | POA: Diagnosis not present

## 2023-09-10 DIAGNOSIS — M25411 Effusion, right shoulder: Secondary | ICD-10-CM | POA: Diagnosis not present

## 2023-09-10 DIAGNOSIS — M47812 Spondylosis without myelopathy or radiculopathy, cervical region: Secondary | ICD-10-CM | POA: Diagnosis not present

## 2023-09-13 ENCOUNTER — Encounter: Payer: Self-pay | Admitting: Family Medicine

## 2023-09-14 ENCOUNTER — Telehealth: Payer: Self-pay | Admitting: Adult Health

## 2023-09-14 NOTE — Telephone Encounter (Signed)
 LVM and sent mychart msg informing pt of need to reschedule 01/24/24 appt - NP out

## 2023-09-14 NOTE — Telephone Encounter (Signed)
 Pt called back, she has been r/s and is on wait list in hopes of being seen earlier and a later in the day appointment time.

## 2023-09-26 ENCOUNTER — Other Ambulatory Visit: Payer: Self-pay | Admitting: Family Medicine

## 2023-10-03 DIAGNOSIS — D2262 Melanocytic nevi of left upper limb, including shoulder: Secondary | ICD-10-CM | POA: Diagnosis not present

## 2023-10-03 DIAGNOSIS — L821 Other seborrheic keratosis: Secondary | ICD-10-CM | POA: Diagnosis not present

## 2023-10-03 DIAGNOSIS — L738 Other specified follicular disorders: Secondary | ICD-10-CM | POA: Diagnosis not present

## 2023-10-03 DIAGNOSIS — D692 Other nonthrombocytopenic purpura: Secondary | ICD-10-CM | POA: Diagnosis not present

## 2023-10-03 DIAGNOSIS — L309 Dermatitis, unspecified: Secondary | ICD-10-CM | POA: Diagnosis not present

## 2023-10-03 DIAGNOSIS — D1801 Hemangioma of skin and subcutaneous tissue: Secondary | ICD-10-CM | POA: Diagnosis not present

## 2023-10-05 ENCOUNTER — Encounter: Payer: Self-pay | Admitting: Family Medicine

## 2023-10-06 NOTE — Progress Notes (Signed)
 [facility] [address] [place] [address] Phone: [phone] Subjective:   Cassandra [name], am serving as a scribe for Dr. Ronnell Coins.  I'm seeing this patient by the request  of:  Zilphia Hilt, Charyl Coppersmith, MD  CC: Shoulder pain follow-up  [id number]  09/07/2023 Repeat injection given today, tolerated the procedure well, has been over a year since we have done an injection.  Hopeful that this will make significant improvement and can repeat if necessary.  Patient is in agreement with the plan.  Follow-up again in 3 months has a trip in [place] Day.     Patient is having some cervical radiculopathy in the C6 and C8 distribution.  With some weakness noted with grip strength noted.  MRI ordered today.  Having weakness also noted of the shoulder.  Has had severe AC arthritis previously diagnosed but not responding to the conservative therapy.  Do feel that a MRI is necessary also of the shoulder to see how much that is potentially playing a role.  Depending on findings we will discuss if any surgical intervention is necessary, patient would be a candidate for an epidural injection if needed.  Follow-up with me again in 6-8      Update 10/12/2023 Cassandra [name] is a 78 y.o. female coming in with complaint of R shoulder pain. Patient states that she has numbness that is radiating down the R arm.   Also c/o pain on medial aspect of the L knee.   MRI R shoulder 09/10/2023 MPRESSION: 1. Severe supraspinatus, infraspinatus and subscapularis tendinosis. 2. Moderate tendinosis of the intra-articular portion of the longhead of the biceps tendon. 3. Moderate osteoarthritis of the right glenohumeral joint  MRI Cervical 09/10/2023 IMPRESSION: Generalized mild degenerative change for age. Diffusely patent canal and foramina.   Ligamentum flavum thickening, focal and nodular appearing on the right at C4-5 where there is mild indentation of the posterior  cord.     Past Medical History:  Diagnosis Date   Anemia    Anxiety    Asthma    Back pain    BPPV (benign paroxysmal positional vertigo)    Chronic allergic conjunctivitis    Depression    Diverticulosis    Fibromyalgia    Gallbladder problem    Gallstones    GERD (gastroesophageal reflux disease)    Hiatal hernia    History of bladder stone    Iron (Fe) deficiency anemia    Joint pain    Lump in female breast    Mild persistent asthma    followed by pcp--- dr Ardeth Beckers. sharma ([place] allergy/asthma)   OA (osteoarthritis)    knees, hands   OAB (overactive bladder)    OSA (obstructive sleep apnea)    per pt has not used cpap several years   Pancreatic cyst    PONV (postoperative nausea and vomiting)    Pre-diabetes    RLS (restless legs syndrome)    Seasonal and perennial allergic rhinitis    SUI (stress urinary incontinence, female)    Swallowing difficulty    Vitamin B12 deficiency    Vitamin D  deficiency    Wears glasses    Past Surgical History:  Procedure Laterality Date   BLADDER SUSPENSION  1990's   sling   bladder tacking  2010   with mesh   BREAST LUMPECTOMY Left 1985   Benign cyst   BREAST REDUCTION SURGERY Bilateral 2003 approx.   CORNEA LESION EXCISION Right 2005  approx.   CYSTOSCOPY N/A 07/12/2016   Procedure: CYSTOSCOPY, VAGINOSCOPY, EXAM UNDER ANESTHESIA, STONE LITHOTRIPSY,;  Surgeon: Marco Severs, MD;  Location: [facility];  Service: Urology;  Laterality: N/A;   CYSTOSCOPY N/A 10/11/2016   Procedure: CYSTOSCOPY;  Surgeon: Marco Severs, MD;  Location: [facility];  Service: Urology;  Laterality: N/A;   CYSTOSCOPY WITH LITHOLAPAXY N/A 10/11/2016   Procedure: CYSTOSCOPY WITH LITHOLAPAXY/ EXCISION OF MESH;  Surgeon: Marco Severs, MD;  Location: [facility];  Service: Urology;  Laterality: N/A;   CYSTOSCOPY WITH LITHOLAPAXY N/A 01/22/2019   Procedure: CYSTOSCOPY WITH LITHOLAPAXY;  Surgeon:  Marco Severs, MD;  Location: [facility];  Service: Urology;  Laterality: N/A;  1 HR   CYSTOSCOPY WITH LITHOLAPAXY N/A 08/09/2022   Procedure: CYSTOLITHOLAPAXY;  Surgeon: Lahoma Pigg, MD;  Location: [facility];  Service: Urology;  Laterality: N/A;  45 MINUTES NEEDED FOR CASE   HOLMIUM LASER APPLICATION  07/12/2016   Procedure: HOLMIUM LASER APPLICATION;  Surgeon: Marco Severs, MD;  Location: [facility];  Service: Urology;;   HOLMIUM LASER APPLICATION N/A 01/22/2019   Procedure: HOLMIUM LASER APPLICATION;  Surgeon: Marco Severs, MD;  Location[facility];  Service: Urology;  Laterality: N/A;   RIGHT KNEE ARTHROSCOPY  2013   TOTAL KNEE ARTHROPLASTY Right 09/12/2012   Procedure: RIGHT TOTAL KNEE ARTHROPLASTY;  Surgeon: Aurther Blue, MD;  Location: [facility];  Service: Orthopedics;  Laterality: Right;   VAGINAL HYSTERECTOMY  1984   Social History   Socioeconomic History   Marital status: Widowed    Spouse name: Not on file   Number of children: 2   Years of education: Not on file   Highest education level: Bachelor's degree (e.g., BA, AB, BS)  Occupational History   Occupation: Engineer, technical sales Retired  Tobacco Use   Smoking status: Never   Smokeless tobacco: Never  Vaping Use   Vaping status: Never Used  Substance and Sexual Activity   Alcohol use: Yes    Comment: 1x monthly   Drug use: Never   Sexual activity: Not on file  Other Topics Concern   Not on file  Social History Narrative   Lives alone   R handed   Caffeine: 1 C a day   Social Drivers of Corporate investment banker Strain: Low Risk  (08/22/2023)   Overall Financial Resource Strain (CARDIA)    Difficulty of Paying Living Expenses: Not hard at all  Food Insecurity: No Food Insecurity (08/22/2023)   Hunger Vital Sign    Worried About Running Out of Food in the Last Year: Never true    Ran Out of Food in the Last Year: Never  true  Transportation Needs: No Transportation Needs (08/22/2023)   PRAPARE - Administrator, Civil Service (Medical): No    Lack of Transportation (Non-Medical): No  Physical Activity: Sufficiently Active (08/22/2023)   Exercise Vital Sign    Days of Exercise per Week: 7 days    Minutes of Exercise per Session: 40 min  Stress: No Stress Concern Present (08/22/2023)   [employer] of Occupational Health - Occupational Stress Questionnaire    Feeling of Stress : Only a little  Social Connections: Moderately Isolated (08/22/2023)   Social Connection and Isolation Panel [NHANES]    Frequency of Communication with Friends and Family: More than three times a week    Frequency of Social Gatherings with  Friends and Family: Three times a week    Attends Religious Services: More than 4 times per year    Active Member of Clubs or Organizations: No    Attends Banker Meetings: Not on file    Marital Status: Widowed   Allergies  Allergen Reactions   Cephalosporins Hives   Chlorhexidine  Hives   Codeine     Other reaction(s): Unknown   Hydrochlorothiazide Hives   Misc. Sulfonamide Containing Compounds Hives   Penicillins Hives   Sulfa Antibiotics Hives   Sulfacetamide Sodium     Other reaction(s): Unknown   Family History  Problem Relation Age of Onset   Kidney cancer Mother    Diabetes Mother    Obesity Mother    Dementia Mother        died age 50   Other Father        burn victum   Alcoholism Father    Colon cancer Neg Hx    Inflammatory bowel disease Neg Hx    Liver disease Neg Hx    Pancreatic cancer Neg Hx    Stomach cancer Neg Hx    Rectal cancer Neg Hx     Current Outpatient Medications (Endocrine & Metabolic):    metFORMIN  (GLUCOPHAGE ) 500 MG tablet, Take 1 tablet (500 mg total) by mouth 2 (two) times daily with a meal.  Current Outpatient Medications (Cardiovascular):    EPINEPHrine  0.3 mg/0.3 mL IJ SOAJ injection, Inject 0.3 mg into the  muscle as needed for anaphylaxis.   spironolactone  (ALDACTONE ) 25 MG tablet, TAKE 1 TABLET (25 MG TOTAL) BY MOUTH DAILY.  Current Outpatient Medications (Respiratory):    fluticasone (FLONASE) 50 MCG/ACT nasal spray, Place into both nostrils.    Current Outpatient Medications (Other):    escitalopram  (LEXAPRO ) 10 MG tablet, TAKE 1 TABLET (10 MG TOTAL) BY MOUTH DAILY WITH BREAKFAST.   gabapentin  (NEURONTIN ) 300 MG capsule, TAKE 1 CAPSULE BY MOUTH EVERYDAY AT BEDTIME   meclizine  (ANTIVERT ) 25 MG tablet, TAKE 1 TABLET BY MOUTH 3 TIMES DAILY AS NEEDED FOR DIZZINESS.   mirabegron ER (MYRBETRIQ) 25 MG TB24 tablet, Take 25 mg by mouth daily.   omeprazole  (PRILOSEC) 20 MG capsule, Take 20 mg by mouth 2 (two) times daily before a meal.   psyllium (REGULOID) 0.52 g capsule, Take 0.52 g by mouth daily.   rOPINIRole  (REQUIP ) 0.25 MG tablet, Take 1 tablet (0.25 mg total) by mouth at bedtime.   traZODone  (DESYREL ) 50 MG tablet, Take 1 tablet (50 mg total) by mouth at bedtime as needed. for sleep   Reviewed prior external information including notes and imaging from  primary care provider As well as notes that were available from care everywhere and other healthcare systems.  Past medical history, social, surgical and family history all reviewed in electronic medical record.  No pertanent information unless stated regarding to the chief complaint.   Review of Systems:  No headache, visual changes, nausea, vomiting, diarrhea, constipation, dizziness, abdominal pain, skin rash, fevers, chills, night sweats, weight loss, swollen lymph nodes, body aches, joint swelling, chest pain, shortness of breath, mood changes. POSITIVE muscle aches  Objective  There were no vitals taken for this visit.   General: No apparent distress alert and oriented x3 mood and affect normal, dressed appropriately.  HEENT: Pupils equal, extraocular movements intact  Respiratory: Patient's speak in full sentences and does not  appear short of breath  Cardiovascular: No lower extremity edema, non tender, no erythema  Right shoulder exam shows  positive impingement noted.  Rotator cuff strength seems to be intact.  Positive crossover noted.  Procedure: Real-time Ultrasound Guided Injection of right glenohumeral joint Device: GE Logiq Q7  Ultrasound guided injection is preferred based studies that show increased duration, increased effect, greater accuracy, decreased procedural pain, increased response rate with ultrasound guided versus blind injection.  Verbal informed consent obtained.  Time-out conducted.  Noted no overlying erythema, induration, or other signs of local infection.  Skin prepped in a sterile fashion.  Local anesthesia: Topical Ethyl chloride.  With sterile technique and under real time ultrasound guidance:  Joint visualized.  23g 1  inch needle inserted posterior approach. Pictures taken for needle placement. Patient did have injection of  2 cc of 0.5% Marcaine , and 1.0 cc of Kenalog  40 mg/dL. Completed without difficulty  Pain immediately resolved suggesting accurate placement of the medication.  Advised to call if fevers/chills, erythema, induration, drainage, or persistent bleeding.  Images saved Impression: Technically successful ultrasound guided injection.    Impression and Recommendations:    The above documentation has been reviewed and is accurate and complete Janaria Mccammon M Nataline Basara, DO

## 2023-10-12 ENCOUNTER — Other Ambulatory Visit: Payer: Self-pay

## 2023-10-12 ENCOUNTER — Ambulatory Visit: Admitting: Family Medicine

## 2023-10-12 ENCOUNTER — Encounter: Payer: Self-pay | Admitting: Family Medicine

## 2023-10-12 VITALS — BP 110/62 | HR 86 | Ht 65.0 in | Wt 200.0 lb

## 2023-10-12 DIAGNOSIS — M778 Other enthesopathies, not elsewhere classified: Secondary | ICD-10-CM | POA: Diagnosis not present

## 2023-10-12 DIAGNOSIS — M25511 Pain in right shoulder: Secondary | ICD-10-CM

## 2023-10-12 NOTE — Patient Instructions (Signed)
 Send a message in 2-3 weeks Will order epidural if not better Injected shoulder today See me as scheduled

## 2023-10-12 NOTE — Assessment & Plan Note (Signed)
 Right shoulder tendinitis noted.  Discussed icing regimen and home exercises.  Discussed which activities to do and which ones to avoid.  Hopefully this will make significant improvement.  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  Patient is in agreement with the plan we will follow-up again in 2 months

## 2023-10-23 ENCOUNTER — Other Ambulatory Visit (INDEPENDENT_AMBULATORY_CARE_PROVIDER_SITE_OTHER): Payer: Self-pay | Admitting: Family Medicine

## 2023-10-23 DIAGNOSIS — R7303 Prediabetes: Secondary | ICD-10-CM

## 2023-11-01 ENCOUNTER — Encounter: Payer: Self-pay | Admitting: Family Medicine

## 2023-11-08 ENCOUNTER — Other Ambulatory Visit: Payer: Self-pay | Admitting: Internal Medicine

## 2023-11-08 DIAGNOSIS — F3289 Other specified depressive episodes: Secondary | ICD-10-CM

## 2023-11-08 DIAGNOSIS — R42 Dizziness and giddiness: Secondary | ICD-10-CM

## 2023-12-05 NOTE — Progress Notes (Addendum)
 [name] [address] [place] [address] Phone: [phone] Subjective:   [name], am serving as a scribe for Dr. Arthea Claudene.  I'm seeing this patient by the request  of:  Theophilus Andrews, Tully GRADE, MD  CC: right shoulder pain follow up   YEP:Dlagzrupcz  10/12/2023 Right shoulder tendinitis noted.  Discussed icing regimen and home exercises.  Discussed which activities to do and which ones to avoid.  Hopefully this will make significant improvement.  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  Patient is in agreement with the plan we will follow-up again in 2 months      Update 12/07/2023 Cassandra [name] is a 78 y.o. female coming in with complaint of R shoulder pain. Also c/o trigger finger. Patient states shoulder is not doing much better. Still getting numbness in hands. Trigger finger still locking. R hip also giving her some problems.       Past Medical History:  Diagnosis Date   Anemia    Anxiety    Asthma    Back pain    BPPV (benign paroxysmal positional vertigo)    Chronic allergic conjunctivitis    Depression    Diverticulosis    Fibromyalgia    Gallbladder problem    Gallstones    GERD (gastroesophageal reflux disease)    Hiatal hernia    History of bladder stone    Iron (Fe) deficiency anemia    Joint pain    Lump in female breast    Mild persistent asthma    followed by pcp--- dr jonelle. sharma ([place] allergy/asthma)   OA (osteoarthritis)    knees, hands   OAB (overactive bladder)    OSA (obstructive sleep apnea)    per pt has not used cpap several years   Pancreatic cyst    PONV (postoperative nausea and vomiting)    Pre-diabetes    RLS (restless legs syndrome)    Seasonal and perennial allergic rhinitis    SUI (stress urinary incontinence, female)    Swallowing difficulty    Vitamin B12 deficiency    Vitamin D   deficiency    Wears glasses    Past Surgical History:  Procedure Laterality Date   BLADDER SUSPENSION  1990's   sling   bladder tacking  2010   with mesh   BREAST LUMPECTOMY Left 1985   Benign cyst   BREAST REDUCTION SURGERY Bilateral 2003 approx.   CORNEA LESION EXCISION Right 2005  approx.   CYSTOSCOPY N/A 07/12/2016   Procedure: CYSTOSCOPY, VAGINOSCOPY, EXAM UNDER ANESTHESIA, STONE LITHOTRIPSY,;  Surgeon: Belvie LITTIE Clara, MD;  Location: [facility];  Service: Urology;  Laterality: N/A;   CYSTOSCOPY N/A 10/11/2016   Procedure: CYSTOSCOPY;  Surgeon: Clara Belvie LITTIE, MD;  Location: [facility];  Service: Urology;  Laterality: N/A;   CYSTOSCOPY WITH LITHOLAPAXY N/A 10/11/2016   Procedure: CYSTOSCOPY WITH LITHOLAPAXY/ EXCISION OF MESH;  Surgeon: Clara Belvie LITTIE, MD;  Location: [facility];  Service: Urology;  Laterality: N/A;   CYSTOSCOPY WITH LITHOLAPAXY N/A 01/22/2019   Procedure: CYSTOSCOPY WITH LITHOLAPAXY;  Surgeon: Clara Belvie LITTIE, MD;  Location: [facility];  Service: Urology;  Laterality: N/A;  1 HR   CYSTOSCOPY WITH LITHOLAPAXY N/A 08/09/2022   Procedure: CYSTOLITHOLAPAXY;  Surgeon: Selma Donnice JONELLE, MD;  Location: [facility];  Service: Urology;  Laterality: N/A;  45 MINUTES NEEDED FOR CASE   HOLMIUM LASER APPLICATION  07/12/2016   Procedure: HOLMIUM LASER APPLICATION;  Surgeon: Belvie LITTIE Clara, MD;  Location: [facility];  Service: Urology;;   HOLMIUM LASER APPLICATION N/A 01/22/2019   Procedure: HOLMIUM LASER APPLICATION;  Surgeon: Clara Belvie LITTIE, MD;  Location: [facility];  Service: Urology;  Laterality: N/A;   RIGHT KNEE ARTHROSCOPY  2013   TOTAL KNEE ARTHROPLASTY Right 09/12/2012   Procedure: RIGHT TOTAL KNEE ARTHROPLASTY;  Surgeon: Dempsey LULLA Moan, MD;  Location: [facility];  Service: Orthopedics;  Laterality: Right;   VAGINAL HYSTERECTOMY  1984   Social  History   Socioeconomic History   Marital status: Widowed    Spouse name: Not on file   Number of children: 2   Years of education: Not on file   Highest education level: Bachelor's degree (e.g., BA, AB, BS)  Occupational History   Occupation: Engineer, technical sales Retired  Tobacco Use   Smoking status: Never   Smokeless tobacco: Never  Vaping Use   Vaping status: Never Used  Substance and Sexual Activity   Alcohol use: Yes    Comment: 1x monthly   Drug use: Never   Sexual activity: Not on file  Other Topics Concern   Not on file  Social History Narrative   Lives alone   R handed   Caffeine: 1 C a day   Social Drivers of Corporate investment banker Strain: Low Risk  (08/22/2023)   Overall Financial Resource Strain (CARDIA)    Difficulty of Paying Living Expenses: Not hard at all  Food Insecurity: No Food Insecurity (08/22/2023)   Hunger Vital Sign    Worried About Running Out of Food in the Last Year: Never true    Ran Out of Food in the Last Year: Never true  Transportation Needs: No Transportation Needs (08/22/2023)   PRAPARE - Administrator, Civil Service (Medical): No    Lack of Transportation (Non-Medical): No  Physical Activity: Sufficiently Active (08/22/2023)   Exercise Vital Sign    Days of Exercise per Week: 7 days    Minutes of Exercise per Session: 40 min  Stress: No Stress Concern Present (08/22/2023)   [employer] of Occupational Health - Occupational Stress Questionnaire    Feeling of Stress : Only a little  Social Connections: Moderately Isolated (08/22/2023)   Social Connection and Isolation Panel    Frequency of Communication with Friends and Family: More than three times a week    Frequency of Social Gatherings with Friends and Family: Three times a week    Attends Religious Services: More than 4 times per year    Active Member of Clubs or Organizations: No    Attends Banker Meetings: Not on file    Marital  Status: Widowed   Allergies  Allergen Reactions   Cephalosporins Hives   Chlorhexidine  Hives   Codeine     Other reaction(s): Unknown   Hydrochlorothiazide Hives   Misc. Sulfonamide Containing Compounds Hives   Penicillins Hives   Sulfa Antibiotics Hives   Sulfacetamide Sodium     Other reaction(s): Unknown   Family History  Problem Relation Age of Onset   Kidney cancer Mother    Diabetes Mother    Obesity Mother    Dementia Mother        died age 16   Other Father        burn victum  Alcoholism Father    Colon cancer Neg Hx    Inflammatory bowel disease Neg Hx    Liver disease Neg Hx    Pancreatic cancer Neg Hx    Stomach cancer Neg Hx    Rectal cancer Neg Hx     Current Outpatient Medications (Endocrine & Metabolic):    metFORMIN  (GLUCOPHAGE ) 500 MG tablet, Take 1 tablet (500 mg total) by mouth 2 (two) times daily with a meal.  Current Outpatient Medications (Cardiovascular):    EPINEPHrine  0.3 mg/0.3 mL IJ SOAJ injection, Inject 0.3 mg into the muscle as needed for anaphylaxis.   spironolactone  (ALDACTONE ) 25 MG tablet, TAKE 1 TABLET (25 MG TOTAL) BY MOUTH DAILY.  Current Outpatient Medications (Respiratory):    fluticasone (FLONASE) 50 MCG/ACT nasal spray, Place into both nostrils.    Current Outpatient Medications (Other):    escitalopram  (LEXAPRO ) 10 MG tablet, TAKE 1 TABLET (10 MG TOTAL) BY MOUTH DAILY WITH BREAKFAST.   gabapentin  (NEURONTIN ) 300 MG capsule, Take 1 capsule (300 mg total) by mouth at bedtime.   meclizine  (ANTIVERT ) 25 MG tablet, TAKE 1 TABLET BY MOUTH 3 TIMES DAILY AS NEEDED FOR DIZZINESS.   mirabegron ER (MYRBETRIQ) 25 MG TB24 tablet, Take 25 mg by mouth daily.   omeprazole  (PRILOSEC) 20 MG capsule, Take 20 mg by mouth 2 (two) times daily before a meal.   psyllium (REGULOID) 0.52 g capsule, Take 0.52 g by mouth daily.   rOPINIRole  (REQUIP ) 0.25 MG tablet, Take 1 tablet (0.25 mg total) by mouth at bedtime.   traZODone  (DESYREL ) 50 MG  tablet, Take 1 tablet (50 mg total) by mouth at bedtime as needed. for sleep   Reviewed prior external information including notes and imaging from  primary care provider As well as notes that were available from care everywhere and other healthcare systems.  Past medical history, social, surgical and family history all reviewed in electronic medical record.  No pertanent information unless stated regarding to the chief complaint.   Review of Systems:  No headache, visual changes, nausea, vomiting, diarrhea, constipation, dizziness, abdominal pain, skin rash, fevers, chills, night sweats, weight loss, swollen lymph nodes, body aches, joint swelling, chest pain, shortness of breath, mood changes. POSITIVE muscle aches  Objective  Blood pressure 110/64, pulse 83, height 5' 5 (1.651 m), weight 201 lb (91.2 kg), SpO2 96%.   General: No apparent distress alert and oriented x3 mood and affect normal, dressed appropriately.  HEENT: Pupils equal, extraocular movements intact  Respiratory: Patient's speak in full sentences and does not appear short of breath  Cardiovascular: No lower extremity edema, non tender, no erythema  Right shoulder exam shows moderate arthritic changes of the acromioclavicular joint but severe tenderness to palpation over the greater trochanteric of the hip as well as the acromioclavicular joint of the right shoulder.  Positive crossover of the shoulder.  Negative straight leg test of the lower back.  Tightness noted though. Trigger nodule noted at the A2 pulley of the index finger on the right hand.  Procedure: Real-time Ultrasound Guided Injection of right second flexor tendon sheath Device: GE Logiq Q7 Ultrasound guided injection is preferred based studies that show increased duration, increased effect, greater accuracy, decreased procedural pain, increased response rate, and decreased cost with ultrasound guided versus blind injection.  Verbal informed consent obtained.   Time-out conducted.  Noted no overlying erythema, induration, or other signs of local infection.  Skin prepped in a sterile fashion.  Local anesthesia: Topical Ethyl chloride.  With sterile  technique and under real time ultrasound guidance: With a 25-gauge half inch needle injected with 0.5 cc of 0.5% Marcaine  and 0.5 cc of Kenalog  40 mg/mL into the flexor tendon sheath Completed without difficulty  Pain immediately resolved suggesting accurate placement of the medication.  Advised to call if fevers/chills, erythema, induration, drainage, or persistent bleeding.  Impression: Technically successful ultrasound guided injection.  Procedure: Real-time Ultrasound Guided Injection of Acromioclavicular joint Device: GE Logiq Q7 Ultrasound guided injection is preferred based studies that show increased duration, increased effect, greater accuracy, decreased procedural pain, increased response rate, and decreased cost with ultrasound guided versus blind injection.  Verbal informed consent obtained.  Time-out conducted.  Noted no overlying erythema, induration, or other signs of local infection.  Skin prepped in a sterile fashion.  Local anesthesia: Topical Ethyl chloride.  With sterile technique and under real time ultrasound guidance: With a 25-gauge half inch needle injected with 0.5 cc of 0.5% Marcaine  and 0.5 cc of Kenalog  40 mg/mL Completed without difficulty  Pain immediately resolved suggesting accurate placement of the medication.  Advised to call if fevers/chills, erythema, induration, drainage, or persistent bleeding.  Impression: Technically successful ultrasound guided injection.   Procedure: Real-time Ultrasound Guided Injection of right greater trochanteric bursitis secondary to patient's body habitus Device: GE Logiq Q7 Ultrasound guided injection is preferred based studies that show increased duration, increased effect, greater accuracy, decreased procedural pain, increased  response rate, and decreased cost with ultrasound guided versus blind injection.  Verbal informed consent obtained.  Time-out conducted.  Noted no overlying erythema, induration, or other signs of local infection.  Skin prepped in a sterile fashion.  Local anesthesia: Topical Ethyl chloride.  With sterile technique and under real time ultrasound guidance:  Greater trochanteric area was visualized and patient's bursa was noted. A 22-gauge 3 inch needle was inserted and 4 cc of 0.5% Marcaine  and 1 cc of Kenalog  40 mg/dL was injected. Pictures taken Completed without difficulty  Pain immediately resolved suggesting accurate placement of the medication.  Advised to call if fevers/chills, erythema, induration, drainage, or persistent bleeding.  Images permanently stored  Impression: Technically successful ultrasound guided injection.   Impression and Recommendations:    The above documentation has been reviewed and is accurate and complete Raywood Wailes M Noell Shular, DO

## 2023-12-07 ENCOUNTER — Encounter: Payer: Self-pay | Admitting: Family Medicine

## 2023-12-07 ENCOUNTER — Ambulatory Visit: Admitting: Family Medicine

## 2023-12-07 ENCOUNTER — Other Ambulatory Visit: Payer: Self-pay

## 2023-12-07 VITALS — BP 110/64 | HR 83 | Ht 65.0 in | Wt 201.0 lb

## 2023-12-07 DIAGNOSIS — M65321 Trigger finger, right index finger: Secondary | ICD-10-CM | POA: Diagnosis not present

## 2023-12-07 DIAGNOSIS — M6530 Trigger finger, unspecified finger: Secondary | ICD-10-CM

## 2023-12-07 DIAGNOSIS — M25551 Pain in right hip: Secondary | ICD-10-CM | POA: Diagnosis not present

## 2023-12-07 DIAGNOSIS — M5010 Cervical disc disorder with radiculopathy, unspecified cervical region: Secondary | ICD-10-CM | POA: Diagnosis not present

## 2023-12-07 DIAGNOSIS — M25511 Pain in right shoulder: Secondary | ICD-10-CM | POA: Diagnosis not present

## 2023-12-07 DIAGNOSIS — M19011 Primary osteoarthritis, right shoulder: Secondary | ICD-10-CM | POA: Diagnosis not present

## 2023-12-07 MED ORDER — GABAPENTIN 300 MG PO CAPS: 300.0000 mg | ORAL_CAPSULE | Freq: Every day | ORAL | 3 refills | Status: AC

## 2023-12-07 NOTE — Assessment & Plan Note (Signed)
 3 injections in a 1 year.  Will continue to monitor.  Differential includes lumbar radiculopathy.  Continues to have some multiple joints that continue did not give her pain.  Will need to continue to monitor.  Follow-up again in 6 to 8 weeks.

## 2023-12-07 NOTE — Assessment & Plan Note (Signed)
 Repeat injection given today and tolerated the procedure well, discussed which activities to do and which ones to avoid.  Discussed keeping hands within peripheral vision.  Increase activity slowly.  Follow-up again in 6 to 8 weeks otherwise.

## 2023-12-07 NOTE — Assessment & Plan Note (Signed)
 No specific nerve impingement noted but I am continuing to have some radicular symptoms that we would need to consider the possibility of a  But they are all in the cervical spine with the possibility of a nerve conduction study.  Patient would like to see how the injection in the shoulder does first and then we will discuss further treatment options if continuing to have difficulty.

## 2023-12-07 NOTE — Assessment & Plan Note (Signed)
 Patient given injection and tolerated the procedure well, discussed icing regimen of home exercises, discussed which activities to do and which ones to avoid.  Discussed bracing at night.  Follow-up again in 6 to 8 weeks

## 2023-12-07 NOTE — Patient Instructions (Addendum)
 Injection in shoulder, fingers, and hip Refilled gabapentin  In 2 weeks if not better write us  and we can order epidural See you again in 3 months

## 2023-12-28 ENCOUNTER — Encounter: Payer: Self-pay | Admitting: Family Medicine

## 2024-01-04 ENCOUNTER — Encounter: Payer: Self-pay | Admitting: Internal Medicine

## 2024-01-04 DIAGNOSIS — J301 Allergic rhinitis due to pollen: Secondary | ICD-10-CM | POA: Diagnosis not present

## 2024-01-04 DIAGNOSIS — Z91030 Bee allergy status: Secondary | ICD-10-CM | POA: Diagnosis not present

## 2024-01-04 DIAGNOSIS — J3089 Other allergic rhinitis: Secondary | ICD-10-CM | POA: Diagnosis not present

## 2024-01-04 DIAGNOSIS — J4530 Mild persistent asthma, uncomplicated: Secondary | ICD-10-CM | POA: Diagnosis not present

## 2024-01-05 DIAGNOSIS — R300 Dysuria: Secondary | ICD-10-CM | POA: Diagnosis not present

## 2024-01-11 ENCOUNTER — Telehealth: Payer: Self-pay | Admitting: Adult Health

## 2024-01-11 NOTE — Telephone Encounter (Signed)
 Patient left voicemail to cancel appointment. Returned patient's phone call, left voicemail have cancelled appointment and please call to reschedule appointment.

## 2024-01-19 ENCOUNTER — Other Ambulatory Visit: Payer: Self-pay | Admitting: Internal Medicine

## 2024-01-19 DIAGNOSIS — F3289 Other specified depressive episodes: Secondary | ICD-10-CM

## 2024-01-24 ENCOUNTER — Ambulatory Visit: Payer: PPO | Admitting: Adult Health

## 2024-02-14 ENCOUNTER — Ambulatory Visit: Admitting: Adult Health

## 2024-02-20 ENCOUNTER — Other Ambulatory Visit: Payer: Self-pay | Admitting: Internal Medicine

## 2024-02-20 DIAGNOSIS — R42 Dizziness and giddiness: Secondary | ICD-10-CM

## 2024-02-22 ENCOUNTER — Encounter: Payer: Self-pay | Admitting: Internal Medicine

## 2024-02-22 ENCOUNTER — Ambulatory Visit (INDEPENDENT_AMBULATORY_CARE_PROVIDER_SITE_OTHER): Admitting: Internal Medicine

## 2024-02-22 VITALS — BP 120/80 | HR 73 | Temp 97.9°F | Wt 202.8 lb

## 2024-02-22 DIAGNOSIS — Z6833 Body mass index (BMI) 33.0-33.9, adult: Secondary | ICD-10-CM

## 2024-02-22 DIAGNOSIS — R42 Dizziness and giddiness: Secondary | ICD-10-CM

## 2024-02-22 DIAGNOSIS — H8110 Benign paroxysmal vertigo, unspecified ear: Secondary | ICD-10-CM

## 2024-02-22 DIAGNOSIS — Z23 Encounter for immunization: Secondary | ICD-10-CM | POA: Diagnosis not present

## 2024-02-22 DIAGNOSIS — Z1211 Encounter for screening for malignant neoplasm of colon: Secondary | ICD-10-CM

## 2024-02-22 DIAGNOSIS — R7303 Prediabetes: Secondary | ICD-10-CM

## 2024-02-22 DIAGNOSIS — K2100 Gastro-esophageal reflux disease with esophagitis, without bleeding: Secondary | ICD-10-CM | POA: Diagnosis not present

## 2024-02-22 LAB — POCT GLYCOSYLATED HEMOGLOBIN (HGB A1C): Hemoglobin A1C: 5.7 % — AB (ref 4.0–5.6)

## 2024-02-22 MED ORDER — SPIRONOLACTONE 25 MG PO TABS: 25.0000 mg | ORAL_TABLET | Freq: Every day | ORAL | 0 refills | Status: DC

## 2024-02-22 NOTE — Progress Notes (Signed)
 Established Patient Office Visit     CC/Reason for Visit: Follow-up chronic conditions, medication refills  HPI: Cassandra [name] is a 78 y.o. female who is coming in today for the above mentioned reasons. Past Medical History is significant for: GERD, vertigo, asthma, impaired glucose tolerance, iron deficiency anemia, obesity.  She is due for GI referral.  Requesting flu vaccine.  She needs medication refills, particularly her spironolactone .  Is overdue for annual preventive exam.   Past Medical/Surgical History: Past Medical History:  Diagnosis Date   Anemia    Anxiety    Asthma    Back pain    BPPV (benign paroxysmal positional vertigo)    Chronic allergic conjunctivitis    Depression    Diverticulosis    Fibromyalgia    Gallbladder problem    Gallstones    GERD (gastroesophageal reflux disease)    Hiatal hernia    History of bladder stone    Iron (Fe) deficiency anemia    Joint pain    Lump in female breast    Mild persistent asthma    followed by pcp--- dr jonelle. sharma ( allergy/asthma)   OA (osteoarthritis)    knees, hands   OAB (overactive bladder)    OSA (obstructive sleep apnea)    per pt has not used cpap several years   Pancreatic cyst    PONV (postoperative nausea and vomiting)    Pre-diabetes    RLS (restless legs syndrome)    Seasonal and perennial allergic rhinitis    SUI (stress urinary incontinence, female)    Swallowing difficulty    Vitamin B12 deficiency    Vitamin D  deficiency    Wears glasses     Past Surgical History:  Procedure Laterality Date   BLADDER SUSPENSION  1990's   sling   bladder tacking  2010   with mesh   BREAST LUMPECTOMY Left 1985   Benign cyst   BREAST REDUCTION SURGERY Bilateral 2003 approx.   CORNEA LESION EXCISION Right 2005  approx.   CYSTOSCOPY N/A 07/12/2016   Procedure: CYSTOSCOPY, VAGINOSCOPY, EXAM UNDER ANESTHESIA, STONE LITHOTRIPSY,;  Surgeon: Belvie LITTIE Clara, MD;  Location: [facility];  Service: Urology;  Laterality: N/A;   CYSTOSCOPY N/A 10/11/2016   Procedure: CYSTOSCOPY;  Surgeon: Clara Belvie LITTIE, MD;  Location[facility];  Service: Urology;  Laterality: N/A;   CYSTOSCOPY WITH LITHOLAPAXY N/A 10/11/2016   Procedure: CYSTOSCOPY WITH LITHOLAPAXY/ EXCISION OF MESH;  Surgeon: Clara Belvie LITTIE, MD;  Location: [facility];  Service: Urology;  Laterality: N/A;   CYSTOSCOPY WITH LITHOLAPAXY N/A 01/22/2019   Procedure: CYSTOSCOPY WITH LITHOLAPAXY;  Surgeon: Clara Belvie LITTIE, MD;  Location: [facility];  Service: Urology;  Laterality: N/A;  1 HR   CYSTOSCOPY WITH LITHOLAPAXY N/A 08/09/2022   Procedure: CYSTOLITHOLAPAXY;  Surgeon: Selma Donnice JONELLE, MD;  Location: [facility];  Service: Urology;  Laterality: N/A;  45 MINUTES NEEDED FOR CASE   HOLMIUM LASER APPLICATION  07/12/2016   Procedure: HOLMIUM LASER APPLICATION;  Surgeon: Belvie LITTIE Clara, MD;  Location: [facility];  Service: Urology;;   HOLMIUM LASER APPLICATION N/A 01/22/2019   Procedure: HOLMIUM LASER APPLICATION;  Surgeon: Clara Belvie LITTIE, MD;  Location: [facility];  Service: Urology;  Laterality: N/A;   RIGHT KNEE ARTHROSCOPY  2013   TOTAL KNEE ARTHROPLASTY Right 09/12/2012   Procedure: RIGHT TOTAL KNEE ARTHROPLASTY;  Surgeon: Dempsey LULLA Moan, MD;  Location:  [facility];  Service: Orthopedics;  Laterality: Right;   VAGINAL HYSTERECTOMY  1984    Social History:  reports that she has never smoked. She has never used smokeless tobacco. She reports current alcohol use. She reports that she does not use drugs.  Allergies: Allergies  Allergen Reactions   Cephalosporins Hives   Chlorhexidine  Hives   Codeine     Other reaction(s): Unknown   Hydrochlorothiazide Hives   Misc. Sulfonamide Containing Compounds Hives   Penicillins Hives   Sulfa Antibiotics Hives   Sulfacetamide Sodium     Other reaction(s): Unknown     Family History:  Family History  Problem Relation Age of Onset   Kidney cancer Mother    Diabetes Mother    Obesity Mother    Dementia Mother        died age 39   Other Father        burn victum   Alcoholism Father    Colon cancer Neg Hx    Inflammatory bowel disease Neg Hx    Liver disease Neg Hx    Pancreatic cancer Neg Hx    Stomach cancer Neg Hx    Rectal cancer Neg Hx      Current Outpatient Medications:    EPINEPHrine  0.3 mg/0.3 mL IJ SOAJ injection, Inject 0.3 mg into the muscle as needed for anaphylaxis., Disp: , Rfl:    escitalopram  (LEXAPRO ) 10 MG tablet, TAKE 1 TABLET (10 MG TOTAL) BY MOUTH DAILY WITH BREAKFAST., Disp: 90 tablet, Rfl: 0   fluticasone (FLONASE) 50 MCG/ACT nasal spray, Place into both nostrils., Disp: , Rfl:    gabapentin  (NEURONTIN ) 300 MG capsule, Take 1 capsule (300 mg total) by mouth at bedtime., Disp: 90 capsule, Rfl: 3   meclizine  (ANTIVERT ) 25 MG tablet, TAKE 1 TABLET BY MOUTH 3 TIMES DAILY AS NEEDED FOR DIZZINESS., Disp: 30 tablet, Rfl: 0   metFORMIN  (GLUCOPHAGE ) 500 MG tablet, Take 1 tablet (500 mg total) by mouth 2 (two) times daily with a meal., Disp: 180 tablet, Rfl: 0   mirabegron ER (MYRBETRIQ) 25 MG TB24 tablet, Take 25 mg by mouth daily., Disp: , Rfl:    omeprazole  (PRILOSEC) 20 MG capsule, Take 20 mg by mouth 2 (two) times daily before a meal., Disp: , Rfl:    psyllium (REGULOID) 0.52 g capsule, Take 0.52 g by mouth daily., Disp: , Rfl:    rOPINIRole  (REQUIP ) 0.25 MG tablet, Take 1 tablet (0.25 mg total) by mouth at bedtime., Disp: 30 tablet, Rfl: 5   traZODone  (DESYREL ) 50 MG tablet, Take 1 tablet (50 mg total) by mouth at bedtime as needed. for sleep, Disp: 90 tablet, Rfl: 3   spironolactone  (ALDACTONE ) 25 MG tablet, Take 1 tablet (25 mg total) by mouth daily., Disp: 90 tablet, Rfl: 0  Review of Systems:  Negative unless indicated in HPI.   Physical Exam: Vitals:   02/22/24 1050  BP: 120/80  Pulse: 73  Temp: 97.9 F (36.6  C)  TempSrc: Oral  SpO2: 98%  Weight: 202 lb 12.8 oz (92 kg)    Body mass index is 33.75 kg/m.   Physical Exam Vitals reviewed.  Constitutional:      Appearance: Normal appearance. She is obese.  HENT:     Head: Normocephalic and atraumatic.  Eyes:     Conjunctiva/sclera: Conjunctivae normal.  Cardiovascular:     Rate and Rhythm: Normal rate and regular rhythm.  Pulmonary:     Effort: Pulmonary effort is normal.     Breath  sounds: Normal breath sounds.  Skin:    General: Skin is warm and dry.  Neurological:     General: No focal deficit present.     Mental Status: She is alert and oriented to person, place, and time.  Psychiatric:        Mood and Affect: Mood normal.        Behavior: Behavior normal.        Thought Content: Thought content normal.        Judgment: Judgment normal.      Impression and Plan:  Pre-diabetes -     POCT glycosylated hemoglobin (Hb A1C)  Benign paroxysmal positional vertigo, unspecified laterality  Gastroesophageal reflux disease with esophagitis without hemorrhage  Immunization due  BMI 33.0-33.9,adult  Vertigo -     Spironolactone ; Take 1 tablet (25 mg total) by mouth daily.  Dispense: 90 tablet; Refill: 0  Screening for colon cancer -     Ambulatory referral to Gastroenterology   - A1c of 5.7 remains stable in the prediabetic range. - Refill spironolactone  that per patient really helps with her BPPV. - GERD is well-controlled on medication. - Flu vaccine administered in office today. - Refer to GI as she is now due for screening colonoscopy. -Discussed healthy lifestyle, including increased physical activity and better food choices to promote weight loss.   Time spent:31 minutes reviewing chart, interviewing and examining patient and formulating plan of care.     Tully Theophilus Andrews, MD [facility]

## 2024-02-22 NOTE — Addendum Note (Signed)
 Addended by: KATHRYNE MILLMAN B on: 02/22/2024 11:26 AM   Modules accepted: Orders

## 2024-02-27 ENCOUNTER — Ambulatory Visit (INDEPENDENT_AMBULATORY_CARE_PROVIDER_SITE_OTHER): Admitting: Internal Medicine

## 2024-02-27 ENCOUNTER — Encounter: Payer: Self-pay | Admitting: Internal Medicine

## 2024-02-27 VITALS — BP 110/70 | HR 75 | Temp 98.2°F | Ht 65.5 in | Wt 201.7 lb

## 2024-02-27 DIAGNOSIS — E785 Hyperlipidemia, unspecified: Secondary | ICD-10-CM

## 2024-02-27 DIAGNOSIS — Z0000 Encounter for general adult medical examination without abnormal findings: Secondary | ICD-10-CM | POA: Diagnosis not present

## 2024-02-27 DIAGNOSIS — R7303 Prediabetes: Secondary | ICD-10-CM | POA: Diagnosis not present

## 2024-02-27 DIAGNOSIS — E559 Vitamin D deficiency, unspecified: Secondary | ICD-10-CM

## 2024-02-27 DIAGNOSIS — D509 Iron deficiency anemia, unspecified: Secondary | ICD-10-CM

## 2024-02-27 DIAGNOSIS — E538 Deficiency of other specified B group vitamins: Secondary | ICD-10-CM

## 2024-02-27 DIAGNOSIS — G2581 Restless legs syndrome: Secondary | ICD-10-CM | POA: Diagnosis not present

## 2024-02-27 LAB — COMPREHENSIVE METABOLIC PANEL WITH GFR
ALT: 6 U/L (ref 0–35)
AST: 21 U/L (ref 0–37)
Albumin: 4.4 g/dL (ref 3.5–5.2)
Alkaline Phosphatase: 68 U/L (ref 39–117)
BUN: 15 mg/dL (ref 6–23)
CO2: 30 meq/L (ref 19–32)
Calcium: 10.1 mg/dL (ref 8.4–10.5)
Chloride: 101 meq/L (ref 96–112)
Creatinine, Ser: 0.75 mg/dL (ref 0.40–1.20)
GFR: 77.09 mL/min (ref >60.00)
Glucose, Bld: 105 mg/dL — ABNORMAL HIGH (ref 70–99)
Potassium: 3.6 meq/L (ref 3.5–5.1)
Sodium: 139 meq/L (ref 135–145)
Total Bilirubin: 0.5 mg/dL (ref 0.2–1.2)
Total Protein: 7.6 g/dL (ref 6.0–8.3)

## 2024-02-27 LAB — LIPID PANEL
Cholesterol: 151 mg/dL (ref 0–200)
HDL: 59.2 mg/dL (ref >39.00)
LDL Cholesterol: 76 mg/dL (ref 0–99)
NonHDL: 91.73
Total CHOL/HDL Ratio: 3
Triglycerides: 77 mg/dL (ref 0.0–149.0)
VLDL: 15.4 mg/dL (ref 0.0–40.0)

## 2024-02-27 LAB — CBC WITH DIFFERENTIAL/PLATELET
Basophils Absolute: 0 K/uL (ref 0.0–0.1)
Basophils Relative: 0.3 % (ref 0.0–3.0)
Eosinophils Absolute: 0.1 K/uL (ref 0.0–0.7)
Eosinophils Relative: 1.5 % (ref 0.0–5.0)
HCT: 40 % (ref 36.0–46.0)
Hemoglobin: 12.9 g/dL (ref 12.0–15.0)
Lymphocytes Relative: 25.1 % (ref 12.0–46.0)
Lymphs Abs: 1.7 K/uL (ref 0.7–4.0)
MCHC: 32.2 g/dL (ref 30.0–36.0)
MCV: 84.4 fl (ref 78.0–100.0)
Monocytes Absolute: 0.6 K/uL (ref 0.1–1.0)
Monocytes Relative: 9.3 % (ref 3.0–12.0)
Neutro Abs: 4.4 K/uL (ref 1.4–7.7)
Neutrophils Relative %: 63.8 % (ref 43.0–77.0)
Platelets: 277 K/uL (ref 150.0–400.0)
RBC: 4.75 Mil/uL (ref 3.87–5.11)
RDW: 16.2 % — ABNORMAL HIGH (ref 11.5–15.5)
WBC: 6.9 K/uL (ref 4.0–10.5)

## 2024-02-27 LAB — TSH: TSH: 1.25 u[IU]/mL (ref 0.35–5.50)

## 2024-02-27 NOTE — Progress Notes (Signed)
 Medicare Wellness question were answered 02/21/24 and 02/27/24.

## 2024-02-27 NOTE — Progress Notes (Signed)
 Established Patient Office Visit     CC/Reason for Visit: Annual preventive exam and subsequent Medicare wellness visit  HPI: Cassandra [name] is a 78 y.o. female who is coming in today for the above mentioned reasons. Past Medical History is significant for: GERD, vertigo, asthma, impaired glucose tolerance, obesity, iron deficiency anemia.  She has been having some increased restless leg symptoms.  She is on ropinirole  followed by neurology.  Has routine eye and dental care.  Has had all vaccines except COVID.  Cancer screening is up-to-date with the exception of a colonoscopy and a referral has already been placed, DEXA is up-to-date.   Past Medical/Surgical History: Past Medical History:  Diagnosis Date   Anemia    Anxiety    Asthma    Back pain    BPPV (benign paroxysmal positional vertigo)    Chronic allergic conjunctivitis    Depression    Diverticulosis    Fibromyalgia    Gallbladder problem    Gallstones    GERD (gastroesophageal reflux disease)    Hiatal hernia    History of bladder stone    Iron (Fe) deficiency anemia    Joint pain    Lump in female breast    Mild persistent asthma    followed by pcp--- dr jonelle. sharma ([place] allergy/asthma)   OA (osteoarthritis)    knees, hands   OAB (overactive bladder)    OSA (obstructive sleep apnea)    per pt has not used cpap several years   Pancreatic cyst    PONV (postoperative nausea and vomiting)    Pre-diabetes    RLS (restless legs syndrome)    Seasonal and perennial allergic rhinitis    SUI (stress urinary incontinence, female)    Swallowing difficulty    Vitamin B12 deficiency    Vitamin D  deficiency    Wears glasses     Past Surgical History:  Procedure Laterality Date   BLADDER SUSPENSION  1990's   sling   bladder tacking  2010   with mesh   BREAST LUMPECTOMY Left 1985   Benign cyst   BREAST REDUCTION SURGERY Bilateral 2003 approx.   CORNEA LESION EXCISION Right 2005  approx.   CYSTOSCOPY N/A  07/12/2016   Procedure: CYSTOSCOPY, VAGINOSCOPY, EXAM UNDER ANESTHESIA, STONE LITHOTRIPSY,;  Surgeon: Belvie LITTIE Clara, MD;  Location: [facility];  Service: Urology;  Laterality: N/A;   CYSTOSCOPY N/A 10/11/2016   Procedure: CYSTOSCOPY;  Surgeon: Clara Belvie LITTIE, MD;  Location[facility];  Service: Urology;  Laterality: N/A;   CYSTOSCOPY WITH LITHOLAPAXY N/A 10/11/2016   Procedure: CYSTOSCOPY WITH LITHOLAPAXY/ EXCISION OF MESH;  Surgeon: Clara Belvie LITTIE, MD;  Location: [facility];  Service: Urology;  Laterality: N/A;   CYSTOSCOPY WITH LITHOLAPAXY N/A 01/22/2019   Procedure: CYSTOSCOPY WITH LITHOLAPAXY;  Surgeon: Clara Belvie LITTIE, MD;  [facility];  Service: Urology;  Laterality: N/A;  1 HR   CYSTOSCOPY WITH LITHOLAPAXY N/A 08/09/2022   Procedure: CYSTOLITHOLAPAXY;  Surgeon: Selma Donnice JONELLE, MD;  Location: [facility];  Service: Urology;  Laterality: N/A;  45 MINUTES NEEDED FOR CASE   HOLMIUM LASER APPLICATION  07/12/2016   Procedure: HOLMIUM LASER APPLICATION;  Surgeon: Belvie LITTIE Clara, MD;  Location: [facility];  Service: Urology;;   HOLMIUM LASER APPLICATION N/A 01/22/2019   Procedure: HOLMIUM LASER APPLICATION;  Surgeon: Clara Belvie LITTIE, MD;  Location: [facility];  Service: Urology;  Laterality:  N/A;   RIGHT KNEE ARTHROSCOPY  2013   TOTAL KNEE ARTHROPLASTY Right 09/12/2012   Procedure: RIGHT TOTAL KNEE ARTHROPLASTY;  Surgeon: Dempsey LULLA Moan, MD;  Location: [facility];  Service: Orthopedics;  Laterality: Right;   VAGINAL HYSTERECTOMY  1984    Social History:  reports that she has never smoked. She has never used smokeless tobacco. She reports current alcohol use. She reports that she does not use drugs.  Allergies: Allergies  Allergen Reactions   Cephalosporins Hives   Chlorhexidine  Hives   Codeine     Other reaction(s): Unknown   Hydrochlorothiazide Hives    Misc. Sulfonamide Containing Compounds Hives   Penicillins Hives   Sulfa Antibiotics Hives   Sulfacetamide Sodium     Other reaction(s): Unknown    Family History:  Family History  Problem Relation Age of Onset   Kidney cancer Mother    Diabetes Mother    Obesity Mother    Dementia Mother        died age 76   Other Father        burn victum   Alcoholism Father    Colon cancer Neg Hx    Inflammatory bowel disease Neg Hx    Liver disease Neg Hx    Pancreatic cancer Neg Hx    Stomach cancer Neg Hx    Rectal cancer Neg Hx      Current Outpatient Medications:    EPINEPHrine  0.3 mg/0.3 mL IJ SOAJ injection, Inject 0.3 mg into the muscle as needed for anaphylaxis., Disp: , Rfl:    escitalopram  (LEXAPRO ) 10 MG tablet, TAKE 1 TABLET (10 MG TOTAL) BY MOUTH DAILY WITH BREAKFAST., Disp: 90 tablet, Rfl: 0   fluticasone (FLONASE) 50 MCG/ACT nasal spray, Place into both nostrils., Disp: , Rfl:    gabapentin  (NEURONTIN ) 300 MG capsule, Take 1 capsule (300 mg total) by mouth at bedtime., Disp: 90 capsule, Rfl: 3   meclizine  (ANTIVERT ) 25 MG tablet, TAKE 1 TABLET BY MOUTH 3 TIMES DAILY AS NEEDED FOR DIZZINESS., Disp: 30 tablet, Rfl: 0   metFORMIN  (GLUCOPHAGE ) 500 MG tablet, Take 1 tablet (500 mg total) by mouth 2 (two) times daily with a meal., Disp: 180 tablet, Rfl: 0   mirabegron ER (MYRBETRIQ) 25 MG TB24 tablet, Take 25 mg by mouth daily., Disp: , Rfl:    omeprazole  (PRILOSEC) 20 MG capsule, Take 20 mg by mouth 2 (two) times daily before a meal., Disp: , Rfl:    psyllium (REGULOID) 0.52 g capsule, Take 0.52 g by mouth daily., Disp: , Rfl:    rOPINIRole  (REQUIP ) 0.25 MG tablet, Take 1 tablet (0.25 mg total) by mouth at bedtime., Disp: 30 tablet, Rfl: 5   spironolactone  (ALDACTONE ) 25 MG tablet, Take 1 tablet (25 mg total) by mouth daily., Disp: 90 tablet, Rfl: 0   traZODone  (DESYREL ) 50 MG tablet, Take 1 tablet (50 mg total) by mouth at bedtime as needed. for sleep, Disp: 90 tablet, Rfl:  3  Review of Systems:  Negative unless indicated in HPI.   Physical Exam: Vitals:   02/27/24 1034  BP: 110/70  Pulse: 75  Temp: 98.2 F (36.8 C)  TempSrc: Oral  SpO2: 98%  Weight: 201 lb 11.2 oz (91.5 kg)  Height: 5' 5.5 (1.664 m)    Body mass index is 33.05 kg/m.   Physical Exam Vitals reviewed.  Constitutional:      General: She is not in acute distress.    Appearance: Normal appearance. She is obese. She is not  ill-appearing, toxic-appearing or diaphoretic.  HENT:     Head: Normocephalic.     Right Ear: Tympanic membrane, ear canal and external ear normal. There is no impacted cerumen.     Left Ear: Tympanic membrane, ear canal and external ear normal. There is no impacted cerumen.     Nose: Nose normal.     Mouth/Throat:     Mouth: Mucous membranes are moist.     Pharynx: Oropharynx is clear. No oropharyngeal exudate or posterior oropharyngeal erythema.  Eyes:     General: No scleral icterus.       Right eye: No discharge.        Left eye: No discharge.     Conjunctiva/sclera: Conjunctivae normal.     Pupils: Pupils are equal, round, and reactive to light.  Neck:     Vascular: No carotid bruit.  Cardiovascular:     Rate and Rhythm: Normal rate and regular rhythm.     Pulses: Normal pulses.     Heart sounds: Normal heart sounds.  Pulmonary:     Effort: Pulmonary effort is normal. No respiratory distress.     Breath sounds: Normal breath sounds.  Abdominal:     General: Abdomen is flat. Bowel sounds are normal.     Palpations: Abdomen is soft.  Musculoskeletal:        General: Normal range of motion.     Cervical back: Normal range of motion.  Skin:    General: Skin is warm and dry.  Neurological:     General: No focal deficit present.     Mental Status: She is alert and oriented to person, place, and time. Mental status is at baseline.  Psychiatric:        Mood and Affect: Mood normal.        Behavior: Behavior normal.        Thought Content:  Thought content normal.        Judgment: Judgment normal.    Subsequent Medicare wellness visit   1. Risk factors, based on past  M,S,F - Cardiac Risk Factors include: advanced age (>61men, >27 women);diabetes mellitus   2.  Physical activities: Dietary issues and exercise activities discussed:      3.  Depression/mood:  Flowsheet Row Office Visit from 02/27/2024 in [facility] Total Score 0     4.  ADL's:    02/27/2024   10:27 AM  In your present state of health, do you have any difficulty performing the following activities:  Hearing? 1  Comment wear hearing aids  Vision? 1  Difficulty concentrating or making decisions? 0  Walking or climbing stairs? 1  Comment bad knees  Dressing or bathing? 0  Doing errands, shopping? 0  Preparing Food and eating ? N  Using the Toilet? N  In the past six months, have you accidently leaked urine? Y  Do you have problems with loss of bowel control? Y  Managing your Medications? N  Managing your Finances? N  Housekeeping or managing your Housekeeping? N     5.  Fall risk:     11/22/2021    2:27 PM 08/19/2022    1:36 PM 08/23/2023   10:42 AM 02/22/2024   11:25 AM 02/27/2024   10:30 AM  Fall Risk  Falls in the past year?  1 1 0 0  Was there an injury with Fall?  0 0 0 0  Fall Risk Category Calculator  1 1 0 0  (  RETIRED) Patient Fall Risk Level Low fall risk       Patient at Risk for Falls Due to   History of fall(s)    Fall risk Follow up  Falls evaluation completed Falls evaluation completed;Education provided Falls evaluation completed Falls evaluation completed     Data saved with a previous flowsheet row definition     6.  Home safety: No problems identified   7.  Height weight, and visual acuity: height and weight as above, vision/hearing: Vision Screening   Right eye Left eye Both eyes  Without correction     With correction 20/20 20/20 20/20      8.  Counseling: Counseling given:  Not Answered    9. Lab orders based on risk factors: Laboratory update will be reviewed   10. Cognitive assessment:        02/27/2024   10:32 AM 08/19/2022    1:37 PM  6CIT Screen  What Year? 0 points 0 points  What month? 0 points 0 points  What time? 0 points 0 points  Count back from 20 0 points 0 points  Months in reverse 0 points 0 points  Repeat phrase 0 points 0 points  Total Score 0 points 0 points     11. Screening: Patient provided with a written and personalized 5-10 year screening schedule in the AVS. Health Maintenance  Topic Date Due   COVID-19 Vaccine (7 - 2024-25 season) 01/30/2024   Medicare Annual Wellness Visit  02/26/2025   DTaP/Tdap/Td vaccine (3 - Td or Tdap) 10/28/2028   Pneumococcal Vaccine for age over 29  Completed   Flu Shot  Completed   DEXA scan (bone density measurement)  Completed   Hepatitis C Screening  Completed   Zoster (Shingles) Vaccine  Completed   HPV Vaccine  Aged Out   Meningitis B Vaccine  Aged Out   Breast Cancer Screening  Discontinued   Colon Cancer Screening  Discontinued    12. Provider List Update: Patient Care Team    Relationship Specialty Notifications Start End  Theophilus Andrews, Tully GRADE, MD PCP - General Internal Medicine  11/16/18      13. Advance Directives: Does Patient Have a Medical Advance Directive?: Yes Type of Advance Directive: Healthcare Power of Attorney, Living will, Out of facility DNR (pink MOST or yellow form) Does patient want to make changes to medical advance directive?: No - Patient declined Copy of Healthcare Power of Attorney in Chart?: No - copy requested  14. Opioids: Patient is not on any opioid prescriptions and has no risk factors for a substance use disorder.   15.   Goals      Maintain or improve exercise capacity (6 minute walk)           Weight (lb) < 200 lb (90.7 kg)         I have personally reviewed and noted the following in the patient's chart:   Medical and  social history Use of alcohol, tobacco or illicit drugs  Current medications and supplements Functional ability and status Nutritional status Physical activity Advanced directives List of other physicians Hospitalizations, surgeries, and ER visits in previous 12 months Vitals Screenings to include cognitive, depression, and falls Referrals and appointments  In addition, I have reviewed and discussed with patient certain preventive protocols, quality metrics, and best practice recommendations. A written personalized care plan for preventive services as well as general preventive health recommendations were provided to patient.   Impression and Plan:  Medicare annual  wellness visit, subsequent  Prediabetes -     Comprehensive metabolic panel with GFR; Future  Vitamin D  deficiency -     VITAMIN D  25 Hydroxy (Vit-D Deficiency, Fractures); Future  Vitamin B12 deficiency  Microcytic anemia -     CBC with Differential/Platelet; Future -     IBC + Ferritin; Future  Hyperlipidemia, unspecified hyperlipidemia type -     Lipid panel; Future  RESTLESS LEG SYNDROME -     IBC + Ferritin; Future  Morbid obesity (HCC) -     TSH; Future -     Vitamin B12; Future   -Recommend routine eye and dental care. -Healthy lifestyle discussed in detail. -Labs to be updated today. -Prostate cancer screening: N/A Health Maintenance  Topic Date Due   COVID-19 Vaccine (7 - 2024-25 season) 01/30/2024   Medicare Annual Wellness Visit  02/26/2025   DTaP/Tdap/Td vaccine (3 - Td or Tdap) 10/28/2028   Pneumococcal Vaccine for age over 46  Completed   Flu Shot  Completed   DEXA scan (bone density measurement)  Completed   Hepatitis C Screening  Completed   Zoster (Shingles) Vaccine  Completed   HPV Vaccine  Aged Out   Meningitis B Vaccine  Aged Out   Breast Cancer Screening  Discontinued   Colon Cancer Screening  Discontinued     - Will consider updating COVID-vaccine at a later date. - GI  referral is already placed. - Check iron levels given ongoing restless leg symptoms.    Tully Theophilus Andrews, MD [facility]

## 2024-02-28 ENCOUNTER — Ambulatory Visit: Payer: Self-pay | Admitting: Internal Medicine

## 2024-02-28 LAB — VITAMIN B12: Vitamin B-12: 328 pg/mL (ref 211–911)

## 2024-02-28 LAB — VITAMIN D 25 HYDROXY (VIT D DEFICIENCY, FRACTURES): VITD: 57.87 ng/mL (ref 30.00–100.00)

## 2024-03-05 NOTE — Progress Notes (Addendum)
 [name] [address] [place] [address] Phone: [phone] Subjective:   [name], am serving as a scribe for Dr. Arthea Claudene.  I'm seeing this patient by the request  of:  Theophilus Andrews, Tully GRADE, MD  CC: Right shoulder pain  YEP:Dlagzrupcz  12/07/2023 No specific nerve impingement noted but I am continuing to have some radicular symptoms that we would need to consider the possibility of a   But they are all in the cervical spine with the possibility of a nerve conduction study.  Patient would like to see how the injection in the shoulder does first and then we will discuss further treatment options if continuing to have difficulty.     Repeat injection given today and tolerated the procedure well, discussed which activities to do and which ones to avoid.  Discussed keeping hands within peripheral vision.  Increase activity slowly.  Follow-up again in 6 to 8 weeks otherwise.      Update 03/06/2024 Cassandra [name] is a 78 y.o. female coming in with complaint of R hip and R shoulder pain. Patient states has been having some neck problems. Has massage yesterday. Wanted to talk to him about gabapentin . Shoulder is doing much better. Still sore. Hip is doing well.       Past Medical History:  Diagnosis Date   Anemia    Anxiety    Asthma    Back pain    BPPV (benign paroxysmal positional vertigo)    Chronic allergic conjunctivitis    Depression    Diverticulosis    Fibromyalgia    Gallbladder problem    Gallstones    GERD (gastroesophageal reflux disease)    Hiatal hernia    History of bladder stone    Iron (Fe) deficiency anemia    Joint pain    Lump in female breast    Mild persistent asthma    followed by pcp--- dr jonelle. sharma ([place] allergy/asthma)   OA (osteoarthritis)    knees, hands   OAB (overactive bladder)    OSA (obstructive sleep apnea)    per pt has not used cpap several years   Pancreatic cyst    PONV  (postoperative nausea and vomiting)    Pre-diabetes    RLS (restless legs syndrome)    Seasonal and perennial allergic rhinitis    SUI (stress urinary incontinence, female)    Swallowing difficulty    Vitamin B12 deficiency    Vitamin D  deficiency    Wears glasses    Past Surgical History:  Procedure Laterality Date   BLADDER SUSPENSION  1990's   sling   bladder tacking  2010   with mesh   BREAST LUMPECTOMY Left 1985   Benign cyst   BREAST REDUCTION SURGERY Bilateral 2003 approx.   CORNEA LESION EXCISION Right 2005  approx.   CYSTOSCOPY N/A 07/12/2016   Procedure: CYSTOSCOPY, VAGINOSCOPY, EXAM UNDER ANESTHESIA, STONE LITHOTRIPSY,;  Surgeon: Belvie LITTIE Clara, MD;  Location: [facility];  Service: Urology;  Laterality: N/A;   CYSTOSCOPY N/A 10/11/2016   Procedure: CYSTOSCOPY;  Surgeon: Clara Belvie LITTIE, MD;  Location: [facility];  Service: Urology;  Laterality: N/A;   CYSTOSCOPY WITH LITHOLAPAXY N/A 10/11/2016   Procedure: CYSTOSCOPY WITH LITHOLAPAXY/ EXCISION OF MESH;  Surgeon: Clara Belvie LITTIE, MD;  Location: [facility];  Service: Urology;  Laterality: N/A;   CYSTOSCOPY WITH LITHOLAPAXY N/A 01/22/2019   Procedure: CYSTOSCOPY WITH LITHOLAPAXY;  Surgeon: Clara,  Belvie CROME, MD;  Location: [facility];  Service: Urology;  Laterality: N/A;  1 HR   CYSTOSCOPY WITH LITHOLAPAXY N/A 08/09/2022   Procedure: CYSTOLITHOLAPAXY;  Surgeon: Selma Donnice SAUNDERS, MD;  Location: [facility];  Service: Urology;  Laterality: N/A;  45 MINUTES NEEDED FOR CASE   HOLMIUM LASER APPLICATION  07/12/2016   Procedure: HOLMIUM LASER APPLICATION;  Surgeon: Belvie CROME Clara, MD;  Location: [facility];  Service: Urology;;   HOLMIUM LASER APPLICATION N/A 01/22/2019   Procedure: HOLMIUM LASER APPLICATION;  Surgeon: Clara Belvie CROME, MD;  Location: [facility];  Service: Urology;  Laterality: N/A;   RIGHT KNEE  ARTHROSCOPY  2013   TOTAL KNEE ARTHROPLASTY Right 09/12/2012   Procedure: RIGHT TOTAL KNEE ARTHROPLASTY;  Surgeon: Dempsey LULLA Moan, MD;  Location: [facility];  Service: Orthopedics;  Laterality: Right;   VAGINAL HYSTERECTOMY  1984   Social History   Socioeconomic History   Marital status: Widowed    Spouse name: Not on file   Number of children: 2   Years of education: Not on file   Highest education level: Bachelor's degree (e.g., BA, AB, BS)  Occupational History   Occupation: engineer, technical sales Retired  Tobacco Use   Smoking status: Never   Smokeless tobacco: Never  Vaping Use   Vaping status: Never Used  Substance and Sexual Activity   Alcohol use: Yes    Comment: 1x monthly   Drug use: Never   Sexual activity: Not on file  Other Topics Concern   Not on file  Social History Narrative   Lives alone   R handed   Caffeine: 1 C a day   Social Drivers of Corporate Investment Banker Strain: Low Risk  (02/27/2024)   Overall Financial Resource Strain (CARDIA)    Difficulty of Paying Living Expenses: Not hard at all  Food Insecurity: No Food Insecurity (02/27/2024)   Hunger Vital Sign    Worried About Running Out of Food in the Last Year: Never true    Ran Out of Food in the Last Year: Never true  Transportation Needs: No Transportation Needs (02/27/2024)   PRAPARE - Administrator, Civil Service (Medical): No    Lack of Transportation (Non-Medical): No  Physical Activity: Sufficiently Active (02/27/2024)   Exercise Vital Sign    Days of Exercise per Week: 7 days    Minutes of Exercise per Session: 40 min  Stress: No Stress Concern Present (02/27/2024)   [employer] of Occupational Health - Occupational Stress Questionnaire    Feeling of Stress: Not at all  Social Connections: Moderately Integrated (02/27/2024)   Social Connection and Isolation Panel    Frequency of Communication with Friends and Family: More than three times a week    Frequency of  Social Gatherings with Friends and Family: Three times a week    Attends Religious Services: More than 4 times per year    Active Member of Clubs or Organizations: Yes    Attends Banker Meetings: More than 4 times per year    Marital Status: Widowed   Allergies  Allergen Reactions   Cephalosporins Hives   Chlorhexidine  Hives   Codeine     Other reaction(s): Unknown   Hydrochlorothiazide Hives   Misc. Sulfonamide Containing Compounds Hives   Penicillins Hives   Sulfa Antibiotics Hives   Sulfacetamide Sodium     Other reaction(s): Unknown   Family History  Problem Relation Age of  Onset   Kidney cancer Mother    Diabetes Mother    Obesity Mother    Dementia Mother        died age 50   Other Father        burn victum   Alcoholism Father    Colon cancer Neg Hx    Inflammatory bowel disease Neg Hx    Liver disease Neg Hx    Pancreatic cancer Neg Hx    Stomach cancer Neg Hx    Rectal cancer Neg Hx     Current Outpatient Medications (Endocrine & Metabolic):    metFORMIN  (GLUCOPHAGE ) 500 MG tablet, Take 1 tablet (500 mg total) by mouth 2 (two) times daily with a meal.  Current Outpatient Medications (Cardiovascular):    EPINEPHrine  0.3 mg/0.3 mL IJ SOAJ injection, Inject 0.3 mg into the muscle as needed for anaphylaxis.   spironolactone  (ALDACTONE ) 25 MG tablet, Take 1 tablet (25 mg total) by mouth daily.  Current Outpatient Medications (Respiratory):    fluticasone (FLONASE) 50 MCG/ACT nasal spray, Place into both nostrils.    Current Outpatient Medications (Other):    escitalopram  (LEXAPRO ) 10 MG tablet, TAKE 1 TABLET (10 MG TOTAL) BY MOUTH DAILY WITH BREAKFAST.   gabapentin  (NEURONTIN ) 300 MG capsule, Take 1 capsule (300 mg total) by mouth at bedtime.   meclizine  (ANTIVERT ) 25 MG tablet, TAKE 1 TABLET BY MOUTH 3 TIMES DAILY AS NEEDED FOR DIZZINESS.   mirabegron ER (MYRBETRIQ) 25 MG TB24 tablet, Take 25 mg by mouth daily.   omeprazole  (PRILOSEC) 20 MG  capsule, Take 20 mg by mouth 2 (two) times daily before a meal.   psyllium (REGULOID) 0.52 g capsule, Take 0.52 g by mouth daily.   rOPINIRole  (REQUIP ) 0.25 MG tablet, Take 1 tablet (0.25 mg total) by mouth at bedtime.   traZODone  (DESYREL ) 50 MG tablet, Take 1 tablet (50 mg total) by mouth at bedtime as needed. for sleep   Reviewed prior external information including notes and imaging from  primary care provider As well as notes that were available from care everywhere and other healthcare systems.  Past medical history, social, surgical and family history all reviewed in electronic medical record.  No pertanent information unless stated regarding to the chief complaint.   Review of Systems:  No headache, visual changes, nausea, vomiting, diarrhea, constipation, dizziness, abdominal pain, skin rash, fevers, chills, night sweats, weight loss, swollen lymph nodes, body aches, joint swelling, chest pain, shortness of breath, mood changes. POSITIVE muscle aches  Objective  Blood pressure 130/74, pulse 84, height 5' 5 (1.651 m), weight 200 lb (90.7 kg), SpO2 96%.   General: No apparent distress alert and oriented x3 mood and affect normal, dressed appropriately.  HEENT: Pupils equal, extraocular movements intact  Respiratory: Patient's speak in full sentences and does not appear short of breath  Cardiovascular: No lower extremity edema, non tender, no erythema  Right shoulder exam shows some good range of motion of the shoulder but unfortunately patient has significant limitation in the with range of motion of the neck especially with sidebending as well as rotation.  Fairly severe.  Only 5 degrees of extension noted as well.   After verbal consent patient was prepped with alcohol swab and with a 25-gauge 1 inch needle injected in 4 distinct trigger points in the levator scapula, rhomboid, and levator scapula muscle.  Total of 3 cc 0.5% Marcaine  and 1 cc of Kenalog  40 mg/mL units.  No blood  loss.  Band-Aid placed.  Postinjection  instructions given.  Able to breathe without any significant difficulty. Impression and Recommendations:    The above documentation has been reviewed and is accurate and complete Katheryne Gorr M Alwaleed Obeso, DO

## 2024-03-06 ENCOUNTER — Encounter: Payer: Self-pay | Admitting: Family Medicine

## 2024-03-06 ENCOUNTER — Ambulatory Visit: Admitting: Family Medicine

## 2024-03-06 VITALS — BP 130/74 | HR 84 | Ht 65.0 in | Wt 200.0 lb

## 2024-03-06 DIAGNOSIS — M5010 Cervical disc disorder with radiculopathy, unspecified cervical region: Secondary | ICD-10-CM

## 2024-03-06 DIAGNOSIS — M25511 Pain in right shoulder: Secondary | ICD-10-CM

## 2024-03-06 NOTE — Assessment & Plan Note (Signed)
 Known arthritic changes and concerned that this is contributing to most of the patient's discomfort and pain.  We discussed an epidural which patient is adamantly against.  Discussed increasing gabapentin  as well which patient is concerned because her friends do not want her to go up anymore.  Patient denies of any side effects.  Patient given trigger point injections today and hopefully these will be beneficial.  Encouraged range of motion exercises, continue with massage.  Follow-up again in 2 to 3 months

## 2024-03-06 NOTE — Assessment & Plan Note (Addendum)
 Patient given injection and tolerated the procedure well, discussed icing regimen and home exercises, discussed which activities to do and which ones to avoid.  Increase activity slowly.  Discussed icing regimen.  Given injections in the rhomboid, trapezius and the levator scapula.  Follow-up again in 2 months

## 2024-03-06 NOTE — Patient Instructions (Addendum)
 Good to see you! Trigger point injections Can do heat but always follow with ice See you again in 2-3 months

## 2024-03-08 ENCOUNTER — Encounter: Payer: Self-pay | Admitting: Family Medicine

## 2024-03-21 DIAGNOSIS — R300 Dysuria: Secondary | ICD-10-CM | POA: Diagnosis not present

## 2024-03-21 DIAGNOSIS — R102 Pelvic and perineal pain unspecified side: Secondary | ICD-10-CM | POA: Diagnosis not present

## 2024-03-21 DIAGNOSIS — N3281 Overactive bladder: Secondary | ICD-10-CM | POA: Diagnosis not present

## 2024-03-28 ENCOUNTER — Encounter: Admitting: Internal Medicine

## 2024-04-09 ENCOUNTER — Encounter: Payer: Self-pay | Admitting: Adult Health

## 2024-04-09 ENCOUNTER — Ambulatory Visit: Admitting: Adult Health

## 2024-04-09 VITALS — BP 110/70 | HR 76 | Ht 65.0 in | Wt 204.4 lb

## 2024-04-09 DIAGNOSIS — E611 Iron deficiency: Secondary | ICD-10-CM

## 2024-04-09 DIAGNOSIS — F5104 Psychophysiologic insomnia: Secondary | ICD-10-CM | POA: Diagnosis not present

## 2024-04-09 DIAGNOSIS — G2581 Restless legs syndrome: Secondary | ICD-10-CM | POA: Diagnosis not present

## 2024-04-09 MED ORDER — TRAZODONE HCL 50 MG PO TABS
50.0000 mg | ORAL_TABLET | Freq: Every day | ORAL | 3 refills | Status: AC
Start: 2024-04-09 — End: ?

## 2024-04-09 MED ORDER — ROPINIROLE HCL 0.5 MG PO TABS: 0.5000 mg | ORAL_TABLET | Freq: Every day | ORAL | 11 refills | Status: AC

## 2024-04-09 NOTE — Patient Instructions (Addendum)
 Your Plan:  Increase Requip  to 0.5mg  nightly   After a few days of increasing Requip , can try to increase trazodone  to 1.5 tablets to further help with sleep, if no benefit, can further increase to 2 tablets nightly   We will check iron levels today - will keep you updated regarding results via MyChart       Follow up in 6 months with Dr. Chalice or call earlier if needed      Thank you for coming to see us  at [facility]. I hope we have been able to provide you high quality care today.  You may receive a patient satisfaction survey over the next few weeks. We would appreciate your feedback and comments so that we may continue to improve ourselves and the health of our patients.

## 2024-04-09 NOTE — Progress Notes (Signed)
 [facility] [address] [place]. [place] [address] [phone]       OFFICE FOLLOW UP NOTE  Ms. Cassandra [name] Date of Birth:  April 25, 1946 Medical Record Number:  [id number]   Primary neurologist: Dr. Chalice Reason for visit: Insomnia, RLS    SUBJECTIVE:   CHIEF COMPLAINT:  Chief Complaint  Patient presents with   Follow-up    Pt in 3 Pt here for insomnia and RLS f/u Pt states both are worse    Follow-up visit:  Prior visit: 07/19/2023  Brief HPI:   Cassandra [name] is a 78 y.o. female who was initially evaluated by Dr. Chalice on 12/15/2021 for concern of insomnia.  Underwent sleep study 2009 by Dr. Shellia with borderline sleep apnea and difficulty tolerating CPAP.  Repeated sleep study 12/30/2021 which did not show any significant sleep apnea but did show frequent periodic limb movements and insomnia with sleep initiation.  Neuropsychology evaluation 10/2022 without any progressive neurological disorder and felt memory complaints likely normal for age as well as underlying sleep disturbance and longstanding anxiety/depression contributing.  At prior visit, continued on trazodone  for insomnia.  Started on Requip  for RLS symptoms.  Previously tried pramipexole  but reported worsening RLS symptoms.      Interval history:  Patient returns for follow-up visit.  She reports initially after starting Requip , noted improvement of her RLS symptoms and improved sleep.  Symptoms have been gradually returning and medications including Requip  and trazodone  do not seem to be as effective.  She currently takes Requip  around 8 PM, bedtime typically around 10 PM.  She takes trazodone  50 mg nightly at bedtime.  He can wake up after about 1 to 2 hours with increased RLS symptoms.  She reports general difficulty sleeping at night but does not feel as if her mind wanders at night and has also been having issues with her bladder and neck pain.  She did have recent lab work with PCP  with orders including iron panel but panel never completed for unclear reason.  Her [birth year] is next week and is looking forward to going on a cruise with her friends the week after.        ROS:   14 system review of systems performed and negative with exception of those listed in HPI  PMH:  Past Medical History:  Diagnosis Date   Anemia    Anxiety    Asthma    Back pain    BPPV (benign paroxysmal positional vertigo)    Chronic allergic conjunctivitis    Depression    Diverticulosis    Fibromyalgia    Gallbladder problem    Gallstones    GERD (gastroesophageal reflux disease)    Hiatal hernia    History of bladder stone    Iron (Fe) deficiency anemia    Joint pain    Lump in female breast    Mild persistent asthma    followed by pcp--- dr jonelle. sharma ([place] allergy/asthma)   OA (osteoarthritis)    knees, hands   OAB (overactive bladder)    OSA (obstructive sleep apnea)    per pt has not used cpap several years   Pancreatic cyst    PONV (postoperative nausea and vomiting)    Pre-diabetes    RLS (restless legs syndrome)    Seasonal and perennial allergic rhinitis    SUI (stress urinary incontinence, female)    Swallowing difficulty    Vitamin B12 deficiency    Vitamin D  deficiency  Wears glasses     PSH:  Past Surgical History:  Procedure Laterality Date   BLADDER SUSPENSION  1990's   sling   bladder tacking  2010   with mesh   BREAST LUMPECTOMY Left 1985   Benign cyst   BREAST REDUCTION SURGERY Bilateral 2003 approx.   CORNEA LESION EXCISION Right 2005  approx.   CYSTOSCOPY N/A 07/12/2016   Procedure: CYSTOSCOPY, VAGINOSCOPY, EXAM UNDER ANESTHESIA, STONE LITHOTRIPSY,;  Surgeon: Belvie LITTIE Clara, MD;  Location: [facility];  Service: Urology;  Laterality: N/A;   CYSTOSCOPY N/A 10/11/2016   Procedure: CYSTOSCOPY;  Surgeon: Clara Belvie LITTIE, MD;  Location[facility];  Service: Urology;  Laterality: N/A;   CYSTOSCOPY WITH  LITHOLAPAXY N/A 10/11/2016   Procedure: CYSTOSCOPY WITH LITHOLAPAXY/ EXCISION OF MESH;  Surgeon: Clara Belvie LITTIE, MD;  Location: [facility];  Service: Urology;  Laterality: N/A;   CYSTOSCOPY WITH LITHOLAPAXY N/A 01/22/2019   Procedure: CYSTOSCOPY WITH LITHOLAPAXY;  Surgeon: Clara Belvie LITTIE, MD;  Location: [facility];  Service: Urology;  Laterality: N/A;  1 HR   CYSTOSCOPY WITH LITHOLAPAXY N/A 08/09/2022   Procedure: CYSTOLITHOLAPAXY;  Surgeon: Selma Donnice SAUNDERS, MD;  Location: [facility];  Service: Urology;  Laterality: N/A;  45 MINUTES NEEDED FOR CASE   HOLMIUM LASER APPLICATION  07/12/2016   Procedure: HOLMIUM LASER APPLICATION;  Surgeon: Belvie LITTIE Clara, MD;  Location: [facility];  Service: Urology;;   HOLMIUM LASER APPLICATION N/A 01/22/2019   Procedure: HOLMIUM LASER APPLICATION;  Surgeon: Clara Belvie LITTIE, MD;  Location: [facility];  Service: Urology;  Laterality: N/A;   RIGHT KNEE ARTHROSCOPY  2013   TOTAL KNEE ARTHROPLASTY Right 09/12/2012   Procedure: RIGHT TOTAL KNEE ARTHROPLASTY;  Surgeon: Dempsey LULLA Moan, MD;  Location: [facility];  Service: Orthopedics;  Laterality: Right;   VAGINAL HYSTERECTOMY  1984    Social History:  Social History   Socioeconomic History   Marital status: Widowed    Spouse name: Not on file   Number of children: 2   Years of education: Not on file   Highest education level: Bachelor's degree (e.g., BA, AB, BS)  Occupational History   Occupation: engineer, technical sales Retired  Tobacco Use   Smoking status: Never   Smokeless tobacco: Never  Vaping Use   Vaping status: Never Used  Substance and Sexual Activity   Alcohol use: Yes    Comment: 1x monthly   Drug use: Never   Sexual activity: Not on file  Other Topics Concern   Not on file  Social History Narrative   Lives alone   R handed   Caffeine: 1 C a day   Social Drivers of Corporate Investment Banker  Strain: Low Risk  (02/27/2024)   Overall Financial Resource Strain (CARDIA)    Difficulty of Paying Living Expenses: Not hard at all  Food Insecurity: No Food Insecurity (02/27/2024)   Hunger Vital Sign    Worried About Running Out of Food in the Last Year: Never true    Ran Out of Food in the Last Year: Never true  Transportation Needs: No Transportation Needs (02/27/2024)   PRAPARE - Administrator, Civil Service (Medical): No    Lack of Transportation (Non-Medical): No  Physical Activity: Sufficiently Active (02/27/2024)   Exercise Vital Sign    Days of Exercise per Week: 7 days    Minutes of Exercise per Session: 40 min  Stress:  No Stress Concern Present (02/27/2024)   [employer] of Occupational Health - Occupational Stress Questionnaire    Feeling of Stress: Not at all  Social Connections: Moderately Integrated (02/27/2024)   Social Connection and Isolation Panel    Frequency of Communication with Friends and Family: More than three times a week    Frequency of Social Gatherings with Friends and Family: Three times a week    Attends Religious Services: More than 4 times per year    Active Member of Clubs or Organizations: Yes    Attends Banker Meetings: More than 4 times per year    Marital Status: Widowed  Intimate Partner Violence: Not At Risk (02/27/2024)   Humiliation, Afraid, Rape, and Kick questionnaire    Fear of Current or Ex-Partner: No    Emotionally Abused: No    Physically Abused: No    Sexually Abused: No    Family History:  Family History  Problem Relation Age of Onset   Kidney cancer Mother    Diabetes Mother    Obesity Mother    Dementia Mother        died age 44   Other Father        burn victum   Alcoholism Father    Colon cancer Neg Hx    Inflammatory bowel disease Neg Hx    Liver disease Neg Hx    Pancreatic cancer Neg Hx    Stomach cancer Neg Hx    Rectal cancer Neg Hx     Medications:   Current Outpatient  Medications on File Prior to Visit  Medication Sig Dispense Refill   EPINEPHrine  0.3 mg/0.3 mL IJ SOAJ injection Inject 0.3 mg into the muscle as needed for anaphylaxis.     escitalopram  (LEXAPRO ) 10 MG tablet TAKE 1 TABLET (10 MG TOTAL) BY MOUTH DAILY WITH BREAKFAST. 90 tablet 0   fluticasone (FLONASE) 50 MCG/ACT nasal spray Place into both nostrils.     gabapentin  (NEURONTIN ) 300 MG capsule Take 1 capsule (300 mg total) by mouth at bedtime. 90 capsule 3   meclizine  (ANTIVERT ) 25 MG tablet TAKE 1 TABLET BY MOUTH 3 TIMES DAILY AS NEEDED FOR DIZZINESS. 30 tablet 0   metFORMIN  (GLUCOPHAGE ) 500 MG tablet Take 1 tablet (500 mg total) by mouth 2 (two) times daily with a meal. 180 tablet 0   mirabegron ER (MYRBETRIQ) 25 MG TB24 tablet Take 25 mg by mouth daily.     omeprazole  (PRILOSEC) 20 MG capsule Take 20 mg by mouth 2 (two) times daily before a meal.     psyllium (REGULOID) 0.52 g capsule Take 0.52 g by mouth daily.     rOPINIRole  (REQUIP ) 0.25 MG tablet Take 1 tablet (0.25 mg total) by mouth at bedtime. 30 tablet 5   spironolactone  (ALDACTONE ) 25 MG tablet Take 1 tablet (25 mg total) by mouth daily. 90 tablet 0   traZODone  (DESYREL ) 50 MG tablet Take 1 tablet (50 mg total) by mouth at bedtime as needed. for sleep 90 tablet 3   No current facility-administered medications on file prior to visit.    Allergies:   Allergies  Allergen Reactions   Cephalosporins Hives   Chlorhexidine  Hives   Codeine     Other reaction(s): Unknown   Hydrochlorothiazide Hives   Misc. Sulfonamide Containing Compounds Hives   Penicillins Hives   Sulfa Antibiotics Hives   Sulfacetamide Sodium     Other reaction(s): Unknown      OBJECTIVE:  Physical Exam  Vitals:  04/09/24 0736  BP: 110/70  Pulse: 76  Weight: 204 lb 6.4 oz (92.7 kg)  Height: 5' 5 (1.651 m)   Body mass index is 34.01 kg/m. No results found.  General: well developed, well nourished, very pleasant elderly Caucasian female, seated,  in no evident distress  Neurologic Exam Mental Status: Awake and fully alert. Oriented to place and time. Recent memory mildly impaired and remote memory intact. Attention span, concentration and fund of knowledge appropriate. Mood and affect appropriate.  Cranial Nerves: Pupils equal, briskly reactive to light. Extraocular movements full without nystagmus. Visual fields full to confrontation. Hearing intact. Facial sensation intact. Face, tongue, palate moves normally and symmetrically.  Motor: Normal bulk and tone. Normal strength in all tested extremity muscles Coordination: Rapid alternating movements normal in all extremities. Finger-to-nose and heel-to-shin performed accurately bilaterally. Gait and Station: Arises from chair without difficulty. Stance is normal. Gait demonstrates normal stride length and balance without use of AD.        ASSESSMENT: Cassandra [name] is a 78 y.o. year old female with longstanding history of insomnia.  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 C/o memory loss without significant findings on neurocognitive testing in 10/2022, felt to be more age-related as well as underlying insomnia, anxiety and depression.     PLAN:  RLS/PLMD:  Iron deficiency: Increase Requip  to 0.5 mg 1 to 3 hours prior to bedtime Will repeat iron panel today due to worsening of RLS symptoms  Advised to call if no benefit with increased dose or any difficulty tolerating Previously tried pramipexole  but felt this made symptoms worse  Insomnia:  Can increase trazodone  to 1.5 tablets at bedtime if insomnia symptoms persist a few days after increased Requip  dosage. Can further increase to 2 tabs after 1 week if needed Continued issues with cervicalgia possibly contributing, continue to follow with Dr. Claudene     Follow up in 6 months with Dr. Chalice or  call earlier if needed   CC:  PCP: Theophilus Andrews, Tully GRADE, MD    I personally spent a total of 30 minutes in the care of the patient today including preparing to see the patient, performing a medically appropriate exam/evaluation, counseling and educating, placing orders, and documenting clinical information in the EHR.   Harlene Bogaert, AGNP-BC  [facility] [address] [place], [place] [address]  Phone [phone] Fax [phone] Note: This document was prepared with digital dictation and possible smart phrase technology. Any transcriptional errors that result from this process are unintentional.

## 2024-04-10 ENCOUNTER — Ambulatory Visit: Payer: Self-pay | Admitting: Adult Health

## 2024-04-10 LAB — IRON,TIBC AND FERRITIN PANEL
Ferritin: 21 ng/mL (ref 15–150)
Iron Saturation: 10 % — ABNORMAL LOW (ref 15–55)
Iron: 44 ug/dL (ref 27–139)
Total Iron Binding Capacity: 440 ug/dL (ref 250–450)
UIBC: 396 ug/dL — ABNORMAL HIGH (ref 118–369)

## 2024-04-11 ENCOUNTER — Other Ambulatory Visit: Payer: Self-pay

## 2024-04-11 ENCOUNTER — Ambulatory Visit: Admitting: Sports Medicine

## 2024-04-11 ENCOUNTER — Ambulatory Visit (INDEPENDENT_AMBULATORY_CARE_PROVIDER_SITE_OTHER)

## 2024-04-11 VITALS — HR 69 | Ht 65.0 in | Wt 204.0 lb

## 2024-04-11 DIAGNOSIS — M1612 Unilateral primary osteoarthritis, left hip: Secondary | ICD-10-CM

## 2024-04-11 DIAGNOSIS — M160 Bilateral primary osteoarthritis of hip: Secondary | ICD-10-CM | POA: Diagnosis not present

## 2024-04-11 DIAGNOSIS — M25552 Pain in left hip: Secondary | ICD-10-CM

## 2024-04-11 DIAGNOSIS — M47816 Spondylosis without myelopathy or radiculopathy, lumbar region: Secondary | ICD-10-CM | POA: Diagnosis not present

## 2024-04-11 MED ORDER — MELOXICAM 15 MG PO TABS: 15.0000 mg | ORAL_TABLET | Freq: Every day | ORAL | 0 refills | Status: DC | PRN

## 2024-04-11 MED ORDER — MELOXICAM 15 MG PO TABS: ORAL_TABLET | ORAL | 0 refills | Status: DC

## 2024-04-11 NOTE — Progress Notes (Signed)
 Ben Elia Keenum D.[facility] [address] [place] [address] Phone: [phone]   Assessment and Plan:     1. Left hip pain (Primary) 2. Primary osteoarthritis of left hip -Chronic with exacerbation, initial sports medicine visit - Consistent with flare of left hip osteoarthritis - X-ray obtained in clinic.  My interpretation: No acute fracture or dislocation.  Mildly decreased joint space, acetabular sclerosis, acetabular spurring, inferior femoral head spurring - Patient elected for intra-articular hip CSI.  Tolerated well per note below - Use meloxicam  15 mg daily as needed for breakthrough pain.  Recommend limiting chronic NSAIDs to 1-2 doses per week to prevent long-term side effects. Use Tylenol  500 to 1000 mg tablets 2-3 times a day as needed for day-to-day pain relief.    - Start HEP for hip  Procedure: Ultrasound Guided Hip Acetabulofemoral Joint Injection Side: Left Diagnosis: Flare of osteoarthritis US  Indication:  - accuracy is paramount for diagnosis - to ensure therapeutic efficacy or procedural success - to reduce procedural risk  After explaining the procedure, viable alternatives, risks, and answering any questions, consent was given verbally. The site was cleaned with chlorhexidine  prep. An ultrasound transducer was placed on the anterior thigh/hip.   The acetabular joint, labrum, and femoral shaft were identified.  The neurovascular structures were identified and an approach was found specifically avoiding these structures.  A steroid injection was performed under ultrasound guidance with sterile technique using 2ml of 1% lidocaine  without epinephrine  and 40 mg of triamcinolone  (KENALOG ) 40mg /ml. This was well tolerated and resulted in  relief.  Needle was removed and dressing placed and post injection instructions were given including  a discussion of likely return of pain today after the anesthetic wears off (with the possibility  of worsened pain) until the steroid starts to work in 1-3 days.   Pt was advised to call or return to clinic if these symptoms worsen or fail to improve as anticipated. Images permanently stored.   15 additional minutes spent for educating Therapeutic Home Exercise Program.  This included exercises focusing on stretching, strengthening, with focus on eccentric aspects.   Long term goals include an improvement in range of motion, strength, endurance as well as avoiding reinjury. Patient's frequency would include in 1-2 times a day, 3-5 times a week for a duration of 6-12 weeks. Proper technique shown and discussed handout in great detail with ATC.  All questions were discussed and answered.    Pertinent previous records reviewed include none   Follow Up: Patient has follow-up visit already scheduled with Dr. Claudene on 05/10/2024.  Recommend keeping this appointment.  Could consider physical therapy versus prednisone  course   Subjective:   I, [name], am serving as a neurosurgeon for Doctor Morene Mace  Chief Complaint: left hip pain  HPI:   04/11/24 Patient is a 78 year old female with left hip pain. Patient states pain is in the groin. This am she isnt able to walk or stand on her leg. Decreased ROM. Pain radiates down to the top of her foot. No meds for the pain. No numbness or tingling just pain. No MOI. States she is going out of country next week    Relevant Historical Information: History of right knee replacement, fibromyalgia  Additional pertinent review of systems negative.   Current Outpatient Medications:    meloxicam  (MOBIC ) 15 MG tablet, Take 1 tablet daily for 2 weeks.  If still in pain after 2 weeks, take 1 tablet daily  for an additional 1 week., Disp: 30 tablet, Rfl: 0   EPINEPHrine  0.3 mg/0.3 mL IJ SOAJ injection, Inject 0.3 mg into the muscle as needed for anaphylaxis., Disp: , Rfl:    escitalopram  (LEXAPRO ) 10 MG tablet, TAKE 1 TABLET (10 MG TOTAL) BY MOUTH DAILY  WITH BREAKFAST., Disp: 90 tablet, Rfl: 0   fluticasone (FLONASE) 50 MCG/ACT nasal spray, Place into both nostrils., Disp: , Rfl:    gabapentin  (NEURONTIN ) 300 MG capsule, Take 1 capsule (300 mg total) by mouth at bedtime., Disp: 90 capsule, Rfl: 3   meclizine  (ANTIVERT ) 25 MG tablet, TAKE 1 TABLET BY MOUTH 3 TIMES DAILY AS NEEDED FOR DIZZINESS., Disp: 30 tablet, Rfl: 0   metFORMIN  (GLUCOPHAGE ) 500 MG tablet, Take 1 tablet (500 mg total) by mouth 2 (two) times daily with a meal., Disp: 180 tablet, Rfl: 0   mirabegron ER (MYRBETRIQ) 25 MG TB24 tablet, Take 25 mg by mouth daily., Disp: , Rfl:    omeprazole  (PRILOSEC) 20 MG capsule, Take 20 mg by mouth 2 (two) times daily before a meal., Disp: , Rfl:    psyllium (REGULOID) 0.52 g capsule, Take 0.52 g by mouth daily., Disp: , Rfl:    rOPINIRole  (REQUIP ) 0.5 MG tablet, Take 1 tablet (0.5 mg total) by mouth at bedtime., Disp: 30 tablet, Rfl: 11   spironolactone  (ALDACTONE ) 25 MG tablet, Take 1 tablet (25 mg total) by mouth daily., Disp: 90 tablet, Rfl: 0   traZODone  (DESYREL ) 50 MG tablet, Take 1-2 tablets (50-100 mg total) by mouth at bedtime. for sleep, Disp: 180 tablet, Rfl: 3   Objective:     Vitals:   04/11/24 1042  Pulse: 69  SpO2: 96%  Weight: 204 lb (92.5 kg)  Height: 5' 5 (1.651 m)      Body mass index is 33.95 kg/m.    Physical Exam:    General: awake, alert, and oriented no acute distress, nontoxic Skin: no suspicious lesions or rashes Neuro:sensation intact distally with no deficits, normal muscle tone, no atrophy, strength 5/5 in all tested lower ext groups Psych: normal mood and affect, speech clear   Left hip: No deformity, swelling or wasting ROM Flexion 90, ext 30, IR 35, ER 45 NTTP over the hip flexors, greater trochanter, gluteal musculature, si joint, lumbar spine Positive log roll with FROM Negative FABER Positive FADIR for groin pain Negative Piriformis test Negative trendelenberg Gait normal     Electronically signed by:  Odis Mace D.[facility] 11:23 AM 04/11/24

## 2024-04-11 NOTE — Patient Instructions (Signed)
-   Start meloxicam  15 mg daily x2 weeks.  If still having pain after 2 weeks, complete 3rd-week of NSAID. May use remaining NSAID as needed once daily for pain control.  Do not to use additional over-the-counter NSAIDs (ibuprofen, naproxen, Advil, Aleve, etc.) while taking prescription NSAIDs.  May use Tylenol  [phone] mg 2 to 3 times a day for breakthrough pain.  Hip HEP   As needed follow up

## 2024-04-13 ENCOUNTER — Ambulatory Visit: Payer: Self-pay | Admitting: Sports Medicine

## 2024-04-18 ENCOUNTER — Other Ambulatory Visit: Payer: Self-pay | Admitting: Internal Medicine

## 2024-04-18 DIAGNOSIS — F3289 Other specified depressive episodes: Secondary | ICD-10-CM

## 2024-04-27 DIAGNOSIS — J40 Bronchitis, not specified as acute or chronic: Secondary | ICD-10-CM | POA: Diagnosis not present

## 2024-05-08 DIAGNOSIS — Z124 Encounter for screening for malignant neoplasm of cervix: Secondary | ICD-10-CM | POA: Diagnosis not present

## 2024-05-08 DIAGNOSIS — N952 Postmenopausal atrophic vaginitis: Secondary | ICD-10-CM | POA: Diagnosis not present

## 2024-05-08 DIAGNOSIS — Z6833 Body mass index (BMI) 33.0-33.9, adult: Secondary | ICD-10-CM | POA: Diagnosis not present

## 2024-05-08 DIAGNOSIS — N3281 Overactive bladder: Secondary | ICD-10-CM | POA: Diagnosis not present

## 2024-05-08 DIAGNOSIS — J40 Bronchitis, not specified as acute or chronic: Secondary | ICD-10-CM | POA: Diagnosis not present

## 2024-05-09 ENCOUNTER — Encounter: Payer: Self-pay | Admitting: Gastroenterology

## 2024-05-09 ENCOUNTER — Ambulatory Visit: Admitting: Gastroenterology

## 2024-05-09 VITALS — BP 116/72 | HR 81 | Ht 65.0 in | Wt 202.0 lb

## 2024-05-09 DIAGNOSIS — Z8719 Personal history of other diseases of the digestive system: Secondary | ICD-10-CM | POA: Diagnosis not present

## 2024-05-09 DIAGNOSIS — K529 Noninfective gastroenteritis and colitis, unspecified: Secondary | ICD-10-CM | POA: Diagnosis not present

## 2024-05-09 DIAGNOSIS — R1310 Dysphagia, unspecified: Secondary | ICD-10-CM | POA: Diagnosis not present

## 2024-05-09 DIAGNOSIS — K219 Gastro-esophageal reflux disease without esophagitis: Secondary | ICD-10-CM | POA: Diagnosis not present

## 2024-05-09 DIAGNOSIS — D509 Iron deficiency anemia, unspecified: Secondary | ICD-10-CM | POA: Diagnosis not present

## 2024-05-09 DIAGNOSIS — K8020 Calculus of gallbladder without cholecystitis without obstruction: Secondary | ICD-10-CM

## 2024-05-09 MED ORDER — NA SULFATE-K SULFATE-MG SULF 17.5-3.13-1.6 GM/177ML PO SOLN: 1.0000 | Freq: Once | ORAL | 0 refills | Status: AC

## 2024-05-09 NOTE — Progress Notes (Signed)
 05/09/2024 Cassandra [name] [id number] Oct 15, 1945  Discussed the use of AI scribe software for clinical note transcription with the patient, who gave verbal consent to proceed.  History of Present Illness Cassandra [name] is a 78 year old female who presents for a colonoscopy and evaluation of chronic diarrhea.  She is a patient of Dr. Melba.  She has experienced chronic diarrhea for the past seven to eight years, with loose stools occurring three to four times daily. This has led to episodes of incontinence, particularly during activities like grocery shopping, significantly impacting her daily life. She was told after a previous colonoscopy that she had mild colitis.  She has a history of gallstones and has experienced three episodes of dull, right-sided abdominal pain lasting two to three days, which she attributes to her gallbladder. She avoids greasy foods to prevent these episodes. Despite having gallstones, she still has her gallbladder and is cautious about dietary triggers.  She has a history of iron deficiency anemia, with fluctuating iron levels requiring intermittent iron supplementation. No blood in stool, but her stool appears darker when taking iron supplements.  She experiences swallowing difficulties and has a history of esophagitis. Certain foods induce nausea, and she has been informed of having a small airway. She takes omeprazole  20 mg twice daily but continues to experience symptoms of acid reflux, particularly in the evenings.  She recently had bronchitis for three weeks, treated with antibiotics and prednisone . She reports feeling winded and is trying to regain her strength. She has a history of UTIs and wears pads due to stool incontinence.   EGD 01/2019: - White nummular lesions in esophageal mucosa. Biopsied to rule out Candida. 3 cm hiatal hernia. - Dilation performed in the entire esophagus. - Erythematous mucosa in the antrum. No other gross lesions in the  stomach. Biopsied for HP. - No gross lesions in the duodenal bulb, in the first portion of the duodenum and in the second portion of the duodenum.   Past Medical History:  Diagnosis Date   Anemia    Anxiety    Asthma    Back pain    BPPV (benign paroxysmal positional vertigo)    Chronic allergic conjunctivitis    Depression    Diverticulosis    Fibromyalgia    Gallbladder problem    Gallstones    GERD (gastroesophageal reflux disease)    Hiatal hernia    History of bladder stone    Iron (Fe) deficiency anemia    Joint pain    Lump in female breast    Mild persistent asthma    followed by pcp--- dr jonelle. sharma ([place] allergy/asthma)   OA (osteoarthritis)    knees, hands   OAB (overactive bladder)    OSA (obstructive sleep apnea)    per pt has not used cpap several years   Pancreatic cyst    PONV (postoperative nausea and vomiting)    Pre-diabetes    RLS (restless legs syndrome)    Seasonal and perennial allergic rhinitis    SUI (stress urinary incontinence, female)    Swallowing difficulty    Vitamin B12 deficiency    Vitamin D  deficiency    Wears glasses    Past Surgical History:  Procedure Laterality Date   BLADDER SUSPENSION  1990's   sling   bladder tacking  2010   with mesh   BREAST LUMPECTOMY Left 1985   Benign cyst   BREAST REDUCTION SURGERY Bilateral 2003 approx.   CORNEA LESION  EXCISION Right 2005  approx.   CYSTOSCOPY N/A 07/12/2016   Procedure: CYSTOSCOPY, VAGINOSCOPY, EXAM UNDER ANESTHESIA, STONE LITHOTRIPSY,;  Surgeon: Belvie LITTIE Clara, MD;  Location: [facility];  Service: Urology;  Laterality: N/A;   CYSTOSCOPY N/A 10/11/2016   Procedure: CYSTOSCOPY;  Surgeon: Clara Belvie LITTIE, MD;  Location: [facility];  Service: Urology;  Laterality: N/A;   CYSTOSCOPY WITH LITHOLAPAXY N/A 10/11/2016   Procedure: CYSTOSCOPY WITH LITHOLAPAXY/ EXCISION OF MESH;  Surgeon: Clara Belvie LITTIE, MD;  Location: [facility];   Service: Urology;  Laterality: N/A;   CYSTOSCOPY WITH LITHOLAPAXY N/A 01/22/2019   Procedure: CYSTOSCOPY WITH LITHOLAPAXY;  Surgeon: Clara Belvie LITTIE, MD;  Location: [facility];  Service: Urology;  Laterality: N/A;  1 HR   CYSTOSCOPY WITH LITHOLAPAXY N/A 08/09/2022   Procedure: CYSTOLITHOLAPAXY;  Surgeon: Selma Donnice SAUNDERS, MD;  Location: [facility];  Service: Urology;  Laterality: N/A;  45 MINUTES NEEDED FOR CASE   HOLMIUM LASER APPLICATION  07/12/2016   Procedure: HOLMIUM LASER APPLICATION;  Surgeon: Belvie LITTIE Clara, MD;  Location: [facility];  Service: Urology;;   HOLMIUM LASER APPLICATION N/A 01/22/2019   Procedure: HOLMIUM LASER APPLICATION;  Surgeon: Clara Belvie LITTIE, MD;  Location: [facility];  Service: Urology;  Laterality: N/A;   RIGHT KNEE ARTHROSCOPY  2013   TOTAL KNEE ARTHROPLASTY Right 09/12/2012   Procedure: RIGHT TOTAL KNEE ARTHROPLASTY;  Surgeon: Dempsey LULLA Moan, MD;  Location: [facility];  Service: Orthopedics;  Laterality: Right;   VAGINAL HYSTERECTOMY  1984    reports that she has never smoked. She has never used smokeless tobacco. She reports current alcohol use. She reports that she does not use drugs. family history includes Alcoholism in her father; Dementia in her mother; Diabetes in her mother; Kidney cancer in her mother; Obesity in her mother; Other in her father. Allergies  Allergen Reactions   Cephalosporins Hives   Chlorhexidine  Hives   Codeine     Other reaction(s): Unknown   Hydrochlorothiazide Hives   Misc. Sulfonamide Containing Compounds Hives   Penicillins Hives   Sulfa Antibiotics Hives   Sulfacetamide Sodium     Other reaction(s): Unknown      Outpatient Encounter Medications as of 05/09/2024  Medication Sig   EPINEPHrine  0.3 mg/0.3 mL IJ SOAJ injection Inject 0.3 mg into the muscle as needed for anaphylaxis.   escitalopram  (LEXAPRO ) 10 MG tablet TAKE 1 TABLET (10 MG TOTAL) BY MOUTH  DAILY WITH BREAKFAST.   fluticasone (FLONASE) 50 MCG/ACT nasal spray Place into both nostrils.   gabapentin  (NEURONTIN ) 300 MG capsule Take 1 capsule (300 mg total) by mouth at bedtime.   meclizine  (ANTIVERT ) 25 MG tablet TAKE 1 TABLET BY MOUTH 3 TIMES DAILY AS NEEDED FOR DIZZINESS.   metFORMIN  (GLUCOPHAGE ) 500 MG tablet Take 1 tablet (500 mg total) by mouth 2 (two) times daily with a meal.   mirabegron ER (MYRBETRIQ) 25 MG TB24 tablet Take 25 mg by mouth daily.   omeprazole  (PRILOSEC) 20 MG capsule Take 20 mg by mouth 2 (two) times daily before a meal.   psyllium (REGULOID) 0.52 g capsule Take 0.52 g by mouth daily.   rOPINIRole  (REQUIP ) 0.5 MG tablet Take 1 tablet (0.5 mg total) by mouth at bedtime.   spironolactone  (ALDACTONE ) 25 MG tablet Take 1 tablet (25 mg total) by mouth daily.   traZODone  (DESYREL ) 50 MG tablet Take 1-2 tablets (50-100 mg total) by mouth at bedtime. for  sleep   [DISCONTINUED] meloxicam  (MOBIC ) 15 MG tablet Take 1 tablet (15 mg total) by mouth daily as needed for pain. Take 1 tablet daily for 2 weeks.  If still in pain after 2 weeks, take 1 tablet daily for an additional 1 week. (Patient not taking: Reported on 05/09/2024)   No facility-administered encounter medications on file as of 05/09/2024.    REVIEW OF SYSTEMS  : All other systems reviewed and negative except where noted in the History of Present Illness.   PHYSICAL EXAM: BP 116/72   Pulse 81   Ht 5' 5 (1.651 m)   Wt 202 lb (91.6 kg)   SpO2 98%   BMI 33.61 kg/m  General: Well developed white female in no acute distress Head: Normocephalic and atraumatic Eyes:  Sclerae anicteric, conjunctiva pink. Ears: Normal auditory acuity Lungs: Clear throughout to auscultation; no W/R/R. Heart: Regular rate and rhythm; no M/R/G. Abdomen: Soft, non-distended.  BS present and slightly hyperactive.  Non-tender. Rectal:  Will be done at the time of colonoscopy. Musculoskeletal: Symmetrical with no gross deformities   Skin: No lesions on visible extremities Extremities: No edema  Neurological: Alert oriented x 4, grossly non-focal Psychological:  Alert and cooperative. 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  She reports previous mild colitis finding at last colonoscopy at [facility]. Differential includes microscopic colitis, Crohn's disease, and ulcerative colitis. - Scheduled colonoscopy with biopsy to evaluate for microscopic colitis - Will consider pelvic floor physical therapy if incontinence persists. - EGD as well to biopsy small bowel for celiac and other small bowel enteropathies.  Gallstones with episodic biliary colic Gallstones with episodes of dull right-sided pain lasting 2-3 days. Episodes are infrequent and not typical of acute cholecystitis. Concerns about potential further diarrhea post-cholecystectomy . - Continue to avoid greasy foods to prevent episodes.  Iron deficiency anemia Intermittent iron deficiency anemia with low iron levels. No evidence of gastrointestinal bleeding. Iron supplementation has been inconsistent. No weight loss or significant dietary changes reported.  Hgb normal but iron studies low. - Scheduled upper endoscopy in addition to colonoscopy to evaluate for potential sources of bleeding - Continue iron supplementation as needed  Dysphagia with history of esophagitis Dysphagia with esophagitis and previous esophageal dilation. Symptoms include difficulty swallowing and nausea with certain foods. - Scheduled upper endoscopy to evaluate esophagus and perform dilation if necessary.  Gastroesophageal reflux disease GERD managed with omeprazole  20 mg twice daily. Symptoms persist, possibly due to dietary habits and timing of meals. Long-term use of omeprazole  may affect calcium and vitamin D  absorption, contributing to  osteoporosis risk. - Continue omeprazole  20 mg twice daily - Scheduled upper endoscopy to assess esophageal condition - Will evaluate bone density results in April to assess for osteoporosis risk (this is being done with PCP)  **The risks, benefits, and alternatives to EGD and colonoscopy were discussed with the patient and she consents to proceed.       CC:  Theophilus Andrews, [place]*

## 2024-05-09 NOTE — Patient Instructions (Signed)
 You have been scheduled for an endoscopy and colonoscopy. Please follow the written instructions given to you at your visit today.  If you use inhalers (even only as needed), please bring them with you on the day of your procedure.  DO NOT TAKE 7 DAYS PRIOR TO TEST- Trulicity (dulaglutide) Ozempic, Wegovy (semaglutide) Mounjaro, Zepbound (tirzepatide) Bydureon Bcise (exanatide extended release)  DO NOT TAKE 1 DAY PRIOR TO YOUR TEST Rybelsus (semaglutide) Adlyxin (lixisenatide) Victoza (liraglutide) Byetta (exanatide) ___________________________________________________________________________

## 2024-05-09 NOTE — Progress Notes (Signed)
 [name] [address] [place] [address] Phone: [phone] Subjective:   Cassandra [name], am serving as a scribe for Dr. Arthea Claudene.  I'm seeing this patient by the request  of:  Theophilus Andrews, Tully GRADE, MD  CC: Right shoulder pain  YEP:Dlagzrupcz  03/06/2024 Known arthritic changes and concerned that this is contributing to most of the patient's discomfort and pain.  We discussed an epidural which patient is adamantly against.  Discussed increasing gabapentin  as well which patient is concerned because her friends do not want her to go up anymore.  Patient denies of any side effects.  Patient given trigger point injections today and hopefully these will be beneficial.  Encouraged range of motion exercises, continue with massage.  Follow-up again in 2 to 3 months     Update 05/10/2024 Cassandra [name] is a 78 y.o. female coming in with complaint of cervical spine and L hip pain. Injection from Dr. Leonce on 04/11/2024. Patient states injection from [place] helped. Having her R shoulder pain again. Neck is getting better.       Past Medical History:  Diagnosis Date   Anemia    Anxiety    Asthma    Back pain    BPPV (benign paroxysmal positional vertigo)    Chronic allergic conjunctivitis    Depression    Diverticulosis    Fibromyalgia    Gallbladder problem    Gallstones    GERD (gastroesophageal reflux disease)    Hiatal hernia    History of bladder stone    Iron (Fe) deficiency anemia    Joint pain    Lump in female breast    Mild persistent asthma    followed by pcp--- dr jonelle. sharma ([place] allergy/asthma)   OA (osteoarthritis)    knees, hands   OAB (overactive bladder)    OSA (obstructive sleep apnea)    per pt has not used cpap several years   Pancreatic cyst    PONV (postoperative nausea and vomiting)    Pre-diabetes    RLS (restless legs syndrome)    Seasonal and perennial allergic rhinitis    SUI (stress urinary  incontinence, female)    Swallowing difficulty    Vitamin B12 deficiency    Vitamin D  deficiency    Wears glasses    Past Surgical History:  Procedure Laterality Date   BLADDER SUSPENSION  1990's   sling   bladder tacking  2010   with mesh   BREAST LUMPECTOMY Left 1985   Benign cyst   BREAST REDUCTION SURGERY Bilateral 2003 approx.   CORNEA LESION EXCISION Right 2005  approx.   CYSTOSCOPY N/A 07/12/2016   Procedure: CYSTOSCOPY, VAGINOSCOPY, EXAM UNDER ANESTHESIA, STONE LITHOTRIPSY,;  Surgeon: Belvie LITTIE Clara, MD;  Location: [facility];  Service: Urology;  Laterality: N/A;   CYSTOSCOPY N/A 10/11/2016   Procedure: CYSTOSCOPY;  Surgeon: Clara Belvie LITTIE, MD;  Location: [facility];  Service: Urology;  Laterality: N/A;   CYSTOSCOPY WITH LITHOLAPAXY N/A 10/11/2016   Procedure: CYSTOSCOPY WITH LITHOLAPAXY/ EXCISION OF MESH;  Surgeon: Clara Belvie LITTIE, MD;  Location: [facility];  Service: Urology;  Laterality: N/A;   CYSTOSCOPY WITH LITHOLAPAXY N/A 01/22/2019   Procedure: CYSTOSCOPY WITH LITHOLAPAXY;  Surgeon: Clara Belvie LITTIE, MD;  Location: [facility];  Service: Urology;  Laterality: N/A;  1 HR   CYSTOSCOPY WITH LITHOLAPAXY N/A 08/09/2022   Procedure: CYSTOLITHOLAPAXY;  Surgeon: Selma Donnice JONELLE, MD;  Location: [facility];  Service: Urology;  Laterality: N/A;  45 MINUTES NEEDED FOR CASE   HOLMIUM LASER APPLICATION  07/12/2016   Procedure: HOLMIUM LASER APPLICATION;  Surgeon: Belvie LITTIE Clara, MD;  Location: [facility];  Service: Urology;;   HOLMIUM LASER APPLICATION N/A 01/22/2019   Procedure: HOLMIUM LASER APPLICATION;  Surgeon: Clara Belvie LITTIE, MD;  Location: [facility];  Service: Urology;  Laterality: N/A;   RIGHT KNEE ARTHROSCOPY  2013   TOTAL KNEE ARTHROPLASTY Right 09/12/2012   Procedure: RIGHT TOTAL KNEE ARTHROPLASTY;  Surgeon: Dempsey LULLA Moan, MD;  Location: [facility];  Service: Orthopedics;  Laterality: Right;   VAGINAL HYSTERECTOMY  1984   Social History   Socioeconomic History   Marital status: Widowed    Spouse name: Not on file   Number of children: 2   Years of education: Not on file   Highest education level: Bachelor's degree (e.g., BA, AB, BS)  Occupational History   Occupation: engineer, technical sales Retired  Tobacco Use   Smoking status: Never   Smokeless tobacco: Never  Vaping Use   Vaping status: Never Used  Substance and Sexual Activity   Alcohol use: Yes    Comment: 1x monthly   Drug use: Never   Sexual activity: Not on file  Other Topics Concern   Not on file  Social History Narrative   Lives alone   R handed   Caffeine: 1 C a day   Social Drivers of Health   Tobacco Use: Low Risk (05/09/2024)   Patient History    Smoking Tobacco Use: Never    Smokeless Tobacco Use: Never    Passive Exposure: Not on file  Financial Resource Strain: Low Risk (02/27/2024)   Overall Financial Resource Strain (CARDIA)    Difficulty of Paying Living Expenses: Not hard at all  Food Insecurity: Low Risk (05/02/2024)   Received from [facility]   Epic    Within the past 12 months, you worried that your food would run out before you got money to buy more: Never true    Within the past 12 months, the food you bought just didn't last and you didn't have money to get more. : Never true  Transportation Needs: No Transportation Needs (05/02/2024)   Received from [facility]    In the past 12 months, has lack of reliable transportation kept you from medical appointments, meetings, work or from getting things needed for daily living? : No  Physical Activity: Sufficiently Active (02/27/2024)   Exercise Vital Sign    Days of Exercise per Week: 7 days    Minutes of Exercise per Session: 40 min  Stress: No Stress Concern Present (02/27/2024)   [employer] of Occupational Health - Occupational Stress Questionnaire     Feeling of Stress: Not at all  Social Connections: Moderately Integrated (02/27/2024)   Social Connection and Isolation Panel    Frequency of Communication with Friends and Family: More than three times a week    Frequency of Social Gatherings with Friends and Family: Three times a week    Attends Religious Services: More than 4 times per year    Active Member of Clubs or Organizations: Yes    Attends Banker Meetings: More than 4 times per year    Marital Status: Widowed  Depression (PHQ2-9): Low Risk (02/27/2024)   Depression (PHQ2-9)    PHQ-2 Score: 0  Alcohol Screen: Low Risk (02/27/2024)   Alcohol  Screen    Last Alcohol Screening Score (AUDIT): 0  Housing: Low Risk (05/02/2024)   Received from [facility]   Epic    What is your living situation today?: I have a steady place to live    Think about the place you live. Do you have problems with any of the following? Choose all that apply:: None/None on this list  Utilities: Low Risk (05/02/2024)   Received from [facility]   Utilities    In the past 12 months has the electric, gas, oil, or water  company threatened to shut off services in your home? : No  Health Literacy: Adequate Health Literacy (02/27/2024)   B1300 Health Literacy    Frequency of need for help with medical instructions: Never   Allergies  Allergen Reactions   Cephalosporins Hives   Chlorhexidine  Hives   Codeine     Other reaction(s): Unknown   Hydrochlorothiazide Hives   Misc. Sulfonamide Containing Compounds Hives   Penicillins Hives   Sulfa Antibiotics Hives   Sulfacetamide Sodium     Other reaction(s): Unknown   Family History  Problem Relation Age of Onset   Kidney cancer Mother    Diabetes Mother    Obesity Mother    Dementia Mother        died age 52   Other Father        burn victum   Alcoholism Father    Colon cancer Neg Hx    Inflammatory bowel disease Neg Hx    Liver disease Neg Hx    Pancreatic cancer Neg Hx    Stomach  cancer Neg Hx    Rectal cancer Neg Hx     Current Outpatient Medications (Endocrine & Metabolic):    metFORMIN  (GLUCOPHAGE ) 500 MG tablet, Take 1 tablet (500 mg total) by mouth 2 (two) times daily with a meal.  Current Outpatient Medications (Cardiovascular):    EPINEPHrine  0.3 mg/0.3 mL IJ SOAJ injection, Inject 0.3 mg into the muscle as needed for anaphylaxis.   spironolactone  (ALDACTONE ) 25 MG tablet, Take 1 tablet (25 mg total) by mouth daily.  Current Outpatient Medications (Respiratory):    fluticasone (FLONASE) 50 MCG/ACT nasal spray, Place into both nostrils.  Current Outpatient Medications (Other):    escitalopram  (LEXAPRO ) 10 MG tablet, TAKE 1 TABLET (10 MG TOTAL) BY MOUTH DAILY WITH BREAKFAST.   gabapentin  (NEURONTIN ) 300 MG capsule, Take 1 capsule (300 mg total) by mouth at bedtime.   meclizine  (ANTIVERT ) 25 MG tablet, TAKE 1 TABLET BY MOUTH 3 TIMES DAILY AS NEEDED FOR DIZZINESS.   mirabegron ER (MYRBETRIQ) 25 MG TB24 tablet, Take 25 mg by mouth daily.   omeprazole  (PRILOSEC) 20 MG capsule, Take 20 mg by mouth 2 (two) times daily before a meal.   psyllium (REGULOID) 0.52 g capsule, Take 0.52 g by mouth daily.   rOPINIRole  (REQUIP ) 0.5 MG tablet, Take 1 tablet (0.5 mg total) by mouth at bedtime.   traZODone  (DESYREL ) 50 MG tablet, Take 1-2 tablets (50-100 mg total) by mouth at bedtime. for sleep   Reviewed prior external information including notes and imaging from  primary care provider As well as notes that were available from care everywhere and other healthcare systems.  Past medical history, social, surgical and family history all reviewed in electronic medical record.  No pertanent information unless stated regarding to the chief complaint.   Review of Systems:  No headache, visual changes, nausea, vomiting, diarrhea, constipation, dizziness, abdominal pain, skin rash, fevers, chills, night sweats, weight  loss, swollen lymph nodes, body aches, joint swelling, chest  pain, shortness of breath, mood changes. POSITIVE muscle aches  Objective  Blood pressure 122/70, pulse 96, height 5' 5 (1.651 m), weight 199 lb (90.3 kg), SpO2 98%.   General: No apparent distress alert and oriented x3 mood and affect normal, dressed appropriately.  HEENT: Pupils equal, extraocular movements intact  Respiratory: Patient's speak in full sentences and does not appear short of breath  Cardiovascular: No lower extremity edema, non tender, no erythema  Right shoulder shows that patient does have positive impingement noted.  Pain with empty can.  No specific weakness.  Positive crossover noted.  After informed written and verbal consent, patient was seated on exam table. Right shoulder was prepped with alcohol swab and utilizing posterior approach, patient's right glenohumeral space was injected with 4:1  marcaine  0.5%: Kenalog  40mg /dL. Patient tolerated the procedure well without immediate complications.  After verbal consent patient was prepped with alcohol swab and with a 25-gauge half inch needle injected with 0.5 cc of 0.5% Marcaine  and 0.5 cc of Kenalog  40 mg/mL.  No blood loss.  Band-Aid placed.  Postinjection instruction given   Impression and Recommendations:     The above documentation has been reviewed and is accurate and complete Annalisse Minkoff M Lateefah Mallery, DO

## 2024-05-10 ENCOUNTER — Ambulatory Visit: Admitting: Family Medicine

## 2024-05-10 ENCOUNTER — Encounter: Payer: Self-pay | Admitting: Family Medicine

## 2024-05-10 VITALS — BP 122/70 | HR 96 | Ht 65.0 in | Wt 199.0 lb

## 2024-05-10 DIAGNOSIS — M19011 Primary osteoarthritis, right shoulder: Secondary | ICD-10-CM | POA: Diagnosis not present

## 2024-05-10 DIAGNOSIS — R052 Subacute cough: Secondary | ICD-10-CM | POA: Diagnosis not present

## 2024-05-10 DIAGNOSIS — M7581 Other shoulder lesions, right shoulder: Secondary | ICD-10-CM | POA: Diagnosis not present

## 2024-05-10 DIAGNOSIS — R0609 Other forms of dyspnea: Secondary | ICD-10-CM | POA: Diagnosis not present

## 2024-05-10 NOTE — Patient Instructions (Signed)
 See me in 10-12 weeks Injected R shoulder in 2 spots today

## 2024-05-10 NOTE — Assessment & Plan Note (Signed)
 Hopeful that this will make significant improvement.  Discussed icing regimen and which activities to do and which ones to avoid.  Increase activity slowly.  Follow-up again in 6 to 12 weeks.

## 2024-05-10 NOTE — Progress Notes (Signed)
 Attending Physician's Attestation   I have reviewed the chart.   I agree with the Advanced Practitioner's note, impression, and recommendations with any updates as below.    Corliss Parish, MD [facility] Advanced Endoscopy Office # [phone]

## 2024-05-10 NOTE — Assessment & Plan Note (Signed)
 Patient given injection into the right acromioclavicular joint and tolerated the procedure well with improvement in range of motion almost immediately.  We discussed the next up would be potentially surgical intervention if patient does not respond.  Helpful with this also adding the glenohumeral joint this time that we will make a bigger progress.  Follow-up again in 6 to 12 weeks.

## 2024-06-01 NOTE — Progress Notes (Signed)
 Cassandra [name]                                          MRN: [id number]   06/01/2024   The [facility] Quality Team Specialist reviewed this patient medical record for the purposes of chart review for care gap closure. The following were reviewed: chart review for care gap closure-kidney health evaluation for diabetes:eGFR  and uACR.    [facility]I Quality Team

## 2024-06-04 ENCOUNTER — Other Ambulatory Visit: Payer: Self-pay | Admitting: Internal Medicine

## 2024-06-04 DIAGNOSIS — R42 Dizziness and giddiness: Secondary | ICD-10-CM

## 2024-06-06 ENCOUNTER — Ambulatory Visit: Admitting: Family Medicine

## 2024-07-02 ENCOUNTER — Telehealth: Payer: Self-pay | Admitting: Gastroenterology

## 2024-07-02 NOTE — Telephone Encounter (Signed)
 Inbound call from patient regarding question about prep for her procedure tomorrow.  Advised that since her procedure was scheduled for 9:30 AM 07/03/24 and our office does not open until 10 AM due to increment weather and her procedure has been canceled and she will be called to reschedule at a later date.

## 2024-07-03 ENCOUNTER — Other Ambulatory Visit: Payer: Self-pay | Admitting: Emergency Medicine

## 2024-07-03 DIAGNOSIS — D509 Iron deficiency anemia, unspecified: Secondary | ICD-10-CM

## 2024-07-03 DIAGNOSIS — K219 Gastro-esophageal reflux disease without esophagitis: Secondary | ICD-10-CM

## 2024-07-03 DIAGNOSIS — K529 Noninfective gastroenteritis and colitis, unspecified: Secondary | ICD-10-CM

## 2024-07-03 DIAGNOSIS — R1310 Dysphagia, unspecified: Secondary | ICD-10-CM

## 2024-07-03 NOTE — Telephone Encounter (Signed)
 Thank you for looking into this. GM

## 2024-07-03 NOTE — Telephone Encounter (Signed)
Thanks. GM 

## 2024-07-03 NOTE — Telephone Encounter (Signed)
 Patient has been rescheduled for EGD/Colon on 08/28/2024 at (:30 am in the [facility]. She still has instructions and prep

## 2024-07-03 NOTE — Telephone Encounter (Signed)
 Looks like the pt was set up while in the office on 05/09/24.  Juanetta can you look into this please as well.

## 2024-07-05 NOTE — Progress Notes (Unsigned)
 " [name] [address] [place] [address] Phone: [phone] Subjective:    I'm seeing this patient by the request  of:  Theophilus Andrews, Tully GRADE, MD  CC:   YEP:Dlagzrupcz  05/10/2024 Patient given injection into the right acromioclavicular joint and tolerated the procedure well with improvement in range of motion almost immediately.  We discussed the next up would be potentially surgical intervention if patient does not respond.  Helpful with this also adding the glenohumeral joint this time that we will make a bigger progress.  Follow-up again in 6 to 12 weeks.     Hopeful that this will make significant improvement.  Discussed icing regimen and which activities to do and which ones to avoid.  Increase activity slowly.  Follow-up again in 6 to 12 weeks.   Updated 07/09/2024 Cassandra [name] is a 79 y.o. female coming in with complaint of R shoulder pain       Past Medical History:  Diagnosis Date   Anemia    Anxiety    Asthma    Back pain    BPPV (benign paroxysmal positional vertigo)    Chronic allergic conjunctivitis    Depression    Diverticulosis    Fibromyalgia    Gallbladder problem    Gallstones    GERD (gastroesophageal reflux disease)    Hiatal hernia    History of bladder stone    Iron (Fe) deficiency anemia    Joint pain    Lump in female breast    Mild persistent asthma    followed by pcp--- dr jonelle. frutoso ([place] allergy/asthma)   OA (osteoarthritis)    knees, hands   OAB (overactive bladder)    OSA (obstructive sleep apnea)    per pt has not used cpap several years   Pancreatic cyst    PONV (postoperative nausea and vomiting)    Pre-diabetes    RLS (restless legs syndrome)    Seasonal and perennial allergic rhinitis    SUI (stress urinary incontinence, female)    Swallowing difficulty    Vitamin B12 deficiency    Vitamin D  deficiency    Wears glasses    Past Surgical History:  Procedure Laterality Date    BLADDER SUSPENSION  1990's   sling   bladder tacking  2010   with mesh   BREAST LUMPECTOMY Left 1985   Benign cyst   BREAST REDUCTION SURGERY Bilateral 2003 approx.   CORNEA LESION EXCISION Right 2005  approx.   CYSTOSCOPY N/A 07/12/2016   Procedure: CYSTOSCOPY, VAGINOSCOPY, EXAM UNDER ANESTHESIA, STONE LITHOTRIPSY,;  Surgeon: Belvie LITTIE Clara, MD;  Location: [facility];  Service: Urology;  Laterality: N/A;   CYSTOSCOPY N/A 10/11/2016   Procedure: CYSTOSCOPY;  Surgeon: Clara Belvie LITTIE, MD;  Location: [facility];  Service: Urology;  Laterality: N/A;   CYSTOSCOPY WITH LITHOLAPAXY N/A 10/11/2016   Procedure: CYSTOSCOPY WITH LITHOLAPAXY/ EXCISION OF MESH;  Surgeon: Clara Belvie LITTIE, MD;  Location: [facility];  Service: Urology;  Laterality: N/A;   CYSTOSCOPY WITH LITHOLAPAXY N/A 01/22/2019   Procedure: CYSTOSCOPY WITH LITHOLAPAXY;  Surgeon: Clara Belvie LITTIE, MD;  Location: [facility];  Service: Urology;  Laterality: N/A;  1 HR   CYSTOSCOPY WITH LITHOLAPAXY N/A 08/09/2022   Procedure: CYSTOLITHOLAPAXY;  Surgeon: Selma Donnice JONELLE, MD;  Location: [facility];  Service: Urology;  Laterality: N/A;  45 MINUTES NEEDED FOR CASE   HOLMIUM LASER APPLICATION  07/12/2016  Procedure: HOLMIUM LASER APPLICATION;  Surgeon: Belvie LITTIE Clara, MD;  Location: [facility];  Service: Urology;;   HOLMIUM LASER APPLICATION N/A 01/22/2019   Procedure: HOLMIUM LASER APPLICATION;  Surgeon: Clara Belvie LITTIE, MD;  Location: [facility];  Service: Urology;  Laterality: N/A;   RIGHT KNEE ARTHROSCOPY  2013   TOTAL KNEE ARTHROPLASTY Right 09/12/2012   Procedure: RIGHT TOTAL KNEE ARTHROPLASTY;  Surgeon: Dempsey LULLA Moan, MD;  Location: [facility];  Service: Orthopedics;  Laterality: Right;   VAGINAL HYSTERECTOMY  1984   Social History   Socioeconomic History   Marital status: Widowed    Spouse name: Not on  file   Number of children: 2   Years of education: Not on file   Highest education level: Bachelor's degree (e.g., BA, AB, BS)  Occupational History   Occupation: engineer, technical sales Retired  Tobacco Use   Smoking status: Never   Smokeless tobacco: Never  Vaping Use   Vaping status: Never Used  Substance and Sexual Activity   Alcohol use: Yes    Comment: 1x monthly   Drug use: Never   Sexual activity: Not on file  Other Topics Concern   Not on file  Social History Narrative   Lives alone   R handed   Caffeine: 1 C a day   Social Drivers of Health   Tobacco Use: Low Risk (05/10/2024)   Received from [facility]   Patient History    Smoking Tobacco Use: Never    Smokeless Tobacco Use: Never    Passive Exposure: Not on file  Financial Resource Strain: Low Risk (02/27/2024)   Overall Financial Resource Strain (CARDIA)    Difficulty of Paying Living Expenses: Not hard at all  Food Insecurity: Low Risk (05/02/2024)   Received from [facility]   Epic    Within the past 12 months, you worried that your food would run out before you got money to buy more: Never true    Within the past 12 months, the food you bought just didn't last and you didn't have money to get more. : Never true  Transportation Needs: No Transportation Needs (05/02/2024)   Received from [facility]    In the past 12 months, has lack of reliable transportation kept you from medical appointments, meetings, work or from getting things needed for daily living? : No  Physical Activity: Sufficiently Active (02/27/2024)   Exercise Vital Sign    Days of Exercise per Week: 7 days    Minutes of Exercise per Session: 40 min  Stress: No Stress Concern Present (02/27/2024)   [employer] of Occupational Health - Occupational Stress Questionnaire    Feeling of Stress: Not at all  Social Connections: Moderately Integrated (02/27/2024)   Social Connection and Isolation Panel    Frequency of  Communication with Friends and Family: More than three times a week    Frequency of Social Gatherings with Friends and Family: Three times a week    Attends Religious Services: More than 4 times per year    Active Member of Clubs or Organizations: Yes    Attends Banker Meetings: More than 4 times per year    Marital Status: Widowed  Depression (PHQ2-9): Low Risk (02/27/2024)   Depression (PHQ2-9)    PHQ-2 Score: 0  Alcohol Screen: Low Risk (02/27/2024)   Alcohol Screen    Last Alcohol Screening Score (AUDIT): 0  Housing: Low Risk (05/10/2024)   Received from [facility]   Epic    What is your living situation today?: I have a steady place to live    Think about the place you live. Do you have problems with any of the following? Choose all that apply:: None/None on this list  Utilities: Low Risk (05/02/2024)   Received from [facility]   Utilities    In the past 12 months has the electric, gas, oil, or water  company threatened to shut off services in your home? : No  Health Literacy: Adequate Health Literacy (02/27/2024)   B1300 Health Literacy    Frequency of need for help with medical instructions: Never   Allergies[1] Family History  Problem Relation Age of Onset   Kidney cancer Mother    Diabetes Mother    Obesity Mother    Dementia Mother        died age 30   Other Father        burn victum   Alcoholism Father    Colon cancer Neg Hx    Inflammatory bowel disease Neg Hx    Liver disease Neg Hx    Pancreatic cancer Neg Hx    Stomach cancer Neg Hx    Rectal cancer Neg Hx     Current Outpatient Medications (Endocrine & Metabolic):    metFORMIN  (GLUCOPHAGE ) 500 MG tablet, Take 1 tablet (500 mg total) by mouth 2 (two) times daily with a meal.  Current Outpatient Medications (Cardiovascular):    EPINEPHrine  0.3 mg/0.3 mL IJ SOAJ injection, Inject 0.3 mg into the muscle as needed for anaphylaxis.   spironolactone  (ALDACTONE ) 25 MG tablet, TAKE 1 TABLET (25  MG TOTAL) BY MOUTH DAILY.  Current Outpatient Medications (Respiratory):    fluticasone (FLONASE) 50 MCG/ACT nasal spray, Place into both nostrils.  Current Outpatient Medications (Other):    escitalopram  (LEXAPRO ) 10 MG tablet, TAKE 1 TABLET (10 MG TOTAL) BY MOUTH DAILY WITH BREAKFAST.   gabapentin  (NEURONTIN ) 300 MG capsule, Take 1 capsule (300 mg total) by mouth at bedtime.   meclizine  (ANTIVERT ) 25 MG tablet, TAKE 1 TABLET BY MOUTH 3 TIMES DAILY AS NEEDED FOR DIZZINESS.   mirabegron ER (MYRBETRIQ) 25 MG TB24 tablet, Take 25 mg by mouth daily.   omeprazole  (PRILOSEC) 20 MG capsule, Take 20 mg by mouth 2 (two) times daily before a meal.   psyllium (REGULOID) 0.52 g capsule, Take 0.52 g by mouth daily.   rOPINIRole  (REQUIP ) 0.5 MG tablet, Take 1 tablet (0.5 mg total) by mouth at bedtime.   traZODone  (DESYREL ) 50 MG tablet, Take 1-2 tablets (50-100 mg total) by mouth at bedtime. for sleep   Reviewed prior external information including notes and imaging from  primary care provider As well as notes that were available from care everywhere and other healthcare systems.  Past medical history, social, surgical and family history all reviewed in electronic medical record.  No pertanent information unless stated regarding to the chief complaint.   Review of Systems:  No headache, visual changes, nausea, vomiting, diarrhea, constipation, dizziness, abdominal pain, skin rash, fevers, chills, night sweats, weight loss, swollen lymph nodes, body aches, joint swelling, chest pain, shortness of breath, mood changes. POSITIVE muscle aches  Objective  There were no vitals taken for this visit.   General: No apparent distress alert and oriented x3 mood and affect normal, dressed appropriately.  HEENT: Pupils equal, extraocular movements intact  Respiratory: Patient's speak in full sentences and does not appear short of breath  Cardiovascular: No lower extremity edema, non tender,  no erythema       Impression and Recommendations:           [1]  Allergies Allergen Reactions   Cephalosporins Hives   Chlorhexidine  Hives   Codeine     Other reaction(s): Unknown   Hydrochlorothiazide Hives   Misc. Sulfonamide Containing Compounds Hives   Penicillins Hives   Sulfa Antibiotics Hives   Sulfacetamide Sodium     Other reaction(s): Unknown   "

## 2024-07-09 ENCOUNTER — Ambulatory Visit: Admitting: Family Medicine

## 2024-07-19 ENCOUNTER — Ambulatory Visit: Admitting: Family Medicine

## 2024-08-28 ENCOUNTER — Encounter: Admitting: Gastroenterology

## 2024-10-11 ENCOUNTER — Ambulatory Visit: Admitting: Neurology
